# Patient Record
Sex: Female | Born: 1955
Health system: Southern US, Community
[De-identification: ages and names within clinical notes are randomized; demographics above are authoritative.]

## PROBLEM LIST (undated history)

## (undated) DIAGNOSIS — F319 Bipolar disorder, unspecified: Secondary | ICD-10-CM

## (undated) DIAGNOSIS — R51 Headache: Secondary | ICD-10-CM

## (undated) DIAGNOSIS — I10 Essential (primary) hypertension: Secondary | ICD-10-CM

## (undated) DIAGNOSIS — F329 Major depressive disorder, single episode, unspecified: Secondary | ICD-10-CM

## (undated) DIAGNOSIS — M1990 Unspecified osteoarthritis, unspecified site: Secondary | ICD-10-CM

## (undated) DIAGNOSIS — J449 Chronic obstructive pulmonary disease, unspecified: Secondary | ICD-10-CM

## (undated) DIAGNOSIS — F32A Depression, unspecified: Secondary | ICD-10-CM

## (undated) DIAGNOSIS — G8929 Other chronic pain: Secondary | ICD-10-CM

## (undated) DIAGNOSIS — R519 Headache, unspecified: Secondary | ICD-10-CM

## (undated) DIAGNOSIS — M797 Fibromyalgia: Secondary | ICD-10-CM

## (undated) DIAGNOSIS — M542 Cervicalgia: Secondary | ICD-10-CM

## (undated) HISTORY — DX: Essential (primary) hypertension: I10

## (undated) HISTORY — DX: Major depressive disorder, single episode, unspecified: F32.9

## (undated) HISTORY — PX: COLONOSCOPY: SHX174

## (undated) HISTORY — DX: Chronic obstructive pulmonary disease, unspecified: J44.9

## (undated) HISTORY — DX: Headache, unspecified: R51.9

## (undated) HISTORY — DX: Cervicalgia: M54.2

## (undated) HISTORY — DX: Other chronic pain: G89.29

## (undated) HISTORY — DX: Fibromyalgia: M79.7

## (undated) HISTORY — DX: Depression, unspecified: F32.A

## (undated) HISTORY — DX: Unspecified osteoarthritis, unspecified site: M19.90

## (undated) HISTORY — DX: Bipolar disorder, unspecified: F31.9

## (undated) HISTORY — PX: FRACTURE SURGERY: SHX138

## (undated) HISTORY — DX: Headache: R51

---

## 1997-11-15 ENCOUNTER — Other Ambulatory Visit: Admission: RE | Admit: 1997-11-15 | Discharge: 1997-11-15 | Payer: Self-pay | Admitting: Internal Medicine

## 1999-09-08 ENCOUNTER — Other Ambulatory Visit: Admission: RE | Admit: 1999-09-08 | Discharge: 1999-09-08 | Payer: Self-pay | Admitting: Internal Medicine

## 2000-07-09 ENCOUNTER — Other Ambulatory Visit: Admission: RE | Admit: 2000-07-09 | Discharge: 2000-07-09 | Payer: Self-pay | Admitting: Obstetrics & Gynecology

## 2000-07-09 ENCOUNTER — Encounter: Admission: RE | Admit: 2000-07-09 | Discharge: 2000-07-09 | Payer: Self-pay | Admitting: Obstetrics & Gynecology

## 2001-04-26 ENCOUNTER — Ambulatory Visit (HOSPITAL_BASED_OUTPATIENT_CLINIC_OR_DEPARTMENT_OTHER): Admission: RE | Admit: 2001-04-26 | Discharge: 2001-04-26 | Payer: Self-pay | Admitting: Orthopaedic Surgery

## 2002-09-06 ENCOUNTER — Other Ambulatory Visit: Admission: RE | Admit: 2002-09-06 | Discharge: 2002-09-06 | Payer: Self-pay | Admitting: Obstetrics and Gynecology

## 2002-09-13 ENCOUNTER — Encounter: Admission: RE | Admit: 2002-09-13 | Discharge: 2002-09-13 | Payer: Self-pay | Admitting: Internal Medicine

## 2002-09-13 ENCOUNTER — Encounter: Payer: Self-pay | Admitting: Internal Medicine

## 2002-12-15 ENCOUNTER — Encounter (INDEPENDENT_AMBULATORY_CARE_PROVIDER_SITE_OTHER): Payer: Self-pay | Admitting: Specialist

## 2002-12-15 ENCOUNTER — Ambulatory Visit (HOSPITAL_COMMUNITY): Admission: RE | Admit: 2002-12-15 | Discharge: 2002-12-15 | Payer: Self-pay | Admitting: Obstetrics and Gynecology

## 2005-12-08 ENCOUNTER — Ambulatory Visit (HOSPITAL_BASED_OUTPATIENT_CLINIC_OR_DEPARTMENT_OTHER): Admission: RE | Admit: 2005-12-08 | Discharge: 2005-12-08 | Payer: Self-pay | Admitting: Orthopaedic Surgery

## 2006-04-24 ENCOUNTER — Inpatient Hospital Stay (HOSPITAL_COMMUNITY): Admission: EM | Admit: 2006-04-24 | Discharge: 2006-04-26 | Payer: Self-pay | Admitting: Emergency Medicine

## 2006-05-07 ENCOUNTER — Emergency Department (HOSPITAL_COMMUNITY): Admission: EM | Admit: 2006-05-07 | Discharge: 2006-05-07 | Payer: Self-pay | Admitting: Emergency Medicine

## 2006-11-03 ENCOUNTER — Ambulatory Visit: Payer: Self-pay | Admitting: Family Medicine

## 2006-11-03 DIAGNOSIS — I10 Essential (primary) hypertension: Secondary | ICD-10-CM | POA: Insufficient documentation

## 2006-11-03 DIAGNOSIS — F329 Major depressive disorder, single episode, unspecified: Secondary | ICD-10-CM

## 2006-11-03 DIAGNOSIS — G4700 Insomnia, unspecified: Secondary | ICD-10-CM | POA: Insufficient documentation

## 2006-11-03 DIAGNOSIS — M1990 Unspecified osteoarthritis, unspecified site: Secondary | ICD-10-CM | POA: Insufficient documentation

## 2006-11-03 DIAGNOSIS — N951 Menopausal and female climacteric states: Secondary | ICD-10-CM | POA: Insufficient documentation

## 2006-11-03 DIAGNOSIS — F32A Depression, unspecified: Secondary | ICD-10-CM | POA: Insufficient documentation

## 2006-11-16 ENCOUNTER — Encounter: Payer: Self-pay | Admitting: Family Medicine

## 2006-11-24 ENCOUNTER — Encounter: Admission: RE | Admit: 2006-11-24 | Discharge: 2006-11-24 | Payer: Self-pay | Admitting: Family Medicine

## 2006-12-03 ENCOUNTER — Ambulatory Visit: Payer: Self-pay | Admitting: Family Medicine

## 2006-12-27 ENCOUNTER — Telehealth: Payer: Self-pay | Admitting: Family Medicine

## 2007-07-04 ENCOUNTER — Telehealth: Payer: Self-pay | Admitting: Family Medicine

## 2007-08-15 ENCOUNTER — Ambulatory Visit: Payer: Self-pay | Admitting: Family Medicine

## 2007-08-16 LAB — CONVERTED CEMR LAB
ALT: 18 units/L (ref 0–35)
AST: 17 units/L (ref 0–37)
Albumin: 4 g/dL (ref 3.5–5.2)
Alkaline Phosphatase: 72 units/L (ref 39–117)
BUN: 18 mg/dL (ref 6–23)
Basophils Absolute: 0.1 10*3/uL (ref 0.0–0.1)
Basophils Relative: 0.7 % (ref 0.0–1.0)
Bilirubin, Direct: 0.1 mg/dL (ref 0.0–0.3)
CO2: 27 meq/L (ref 19–32)
Calcium: 9.3 mg/dL (ref 8.4–10.5)
Chloride: 101 meq/L (ref 96–112)
Creatinine, Ser: 0.6 mg/dL (ref 0.4–1.2)
Eosinophils Absolute: 0.3 10*3/uL (ref 0.0–0.7)
Eosinophils Relative: 3.9 % (ref 0.0–5.0)
GFR calc Af Amer: 134 mL/min
GFR calc non Af Amer: 111 mL/min
Glucose, Bld: 94 mg/dL (ref 70–99)
HCT: 39.3 % (ref 36.0–46.0)
Hemoglobin: 14 g/dL (ref 12.0–15.0)
Lymphocytes Relative: 24.3 % (ref 12.0–46.0)
MCHC: 35.5 g/dL (ref 30.0–36.0)
MCV: 88.4 fL (ref 78.0–100.0)
Monocytes Absolute: 0.7 10*3/uL (ref 0.1–1.0)
Monocytes Relative: 9.7 % (ref 3.0–12.0)
Neutro Abs: 4.4 10*3/uL (ref 1.4–7.7)
Neutrophils Relative %: 61.4 % (ref 43.0–77.0)
Platelets: 291 10*3/uL (ref 150–400)
Potassium: 3.8 meq/L (ref 3.5–5.1)
RBC: 4.45 M/uL (ref 3.87–5.11)
RDW: 13.2 % (ref 11.5–14.6)
Sodium: 138 meq/L (ref 135–145)
TSH: 0.56 microintl units/mL (ref 0.35–5.50)
Total Bilirubin: 0.7 mg/dL (ref 0.3–1.2)
Total Protein: 7.1 g/dL (ref 6.0–8.3)
WBC: 7.3 10*3/uL (ref 4.5–10.5)

## 2007-11-07 ENCOUNTER — Encounter: Payer: Self-pay | Admitting: Family Medicine

## 2007-11-08 ENCOUNTER — Encounter: Payer: Self-pay | Admitting: Family Medicine

## 2007-12-21 ENCOUNTER — Telehealth: Payer: Self-pay | Admitting: Family Medicine

## 2007-12-22 ENCOUNTER — Encounter: Payer: Self-pay | Admitting: Family Medicine

## 2007-12-30 ENCOUNTER — Ambulatory Visit: Payer: Self-pay | Admitting: Family Medicine

## 2008-02-29 ENCOUNTER — Telehealth: Payer: Self-pay | Admitting: Family Medicine

## 2008-09-04 ENCOUNTER — Encounter: Payer: Self-pay | Admitting: Family Medicine

## 2009-01-10 ENCOUNTER — Telehealth: Payer: Self-pay | Admitting: Family Medicine

## 2009-02-25 ENCOUNTER — Encounter: Payer: Self-pay | Admitting: Family Medicine

## 2009-02-26 ENCOUNTER — Telehealth: Payer: Self-pay | Admitting: *Deleted

## 2009-04-04 ENCOUNTER — Telehealth: Payer: Self-pay | Admitting: Family Medicine

## 2009-04-04 ENCOUNTER — Encounter: Payer: Self-pay | Admitting: Family Medicine

## 2009-05-03 ENCOUNTER — Ambulatory Visit: Payer: Self-pay | Admitting: Family Medicine

## 2009-05-20 ENCOUNTER — Telehealth: Payer: Self-pay | Admitting: Family Medicine

## 2010-03-31 ENCOUNTER — Encounter: Payer: Self-pay | Admitting: Family Medicine

## 2010-04-10 NOTE — Progress Notes (Signed)
Summary: refill  Phone Note Call from Patient Call back at Home Phone [phone]   Caller: Patient-live call Reason for Call: Refill Medication Summary of Call: refill bp meds and Cymbalta. pt had to cancel on tomorrow. Her dad is having surgery and she does not have ins or the money to come in. Her dad's surgery is next week. [facility] in [place]. Pt cannot remember phone number. Initial call taken by: Warnell Forester,  April 04, 2009 3:16 PM  Follow-up for Phone Call        pt hasn't been seen in over a year Follow-up by: Alfred Levins, CMA,  April 04, 2009 5:04 PM  Additional Follow-up for Phone Call Additional follow up Details #1::        I am sorry but I cannot  just keep giving her meds without seeing her. She needs an OV Additional Follow-up by: Nelwyn Salisbury MD,  April 05, 2009 9:02 AM    Additional Follow-up for Phone Call Additional follow up Details #2::    l/m on cell phone to call and make appt Follow-up by: Alfred Levins, CMA,  April 05, 2009 9:27 AM

## 2010-04-10 NOTE — Progress Notes (Signed)
Summary: [id number]  Phone Note Call from Patient Call back at [phone]   Caller: pt live Call For: Clent Ridges  Summary of Call: husband called and she is in severe depression.  Is there something husband can pick up here at the drug store to take to her in [place].   3 Wellbutrin  left.  She was fired in January and cannot find a job as well as her normal clinical depression.   Initial call taken by: Roselle Locus,  July 04, 2007 2:00 PM  Follow-up for Phone Call        First off, let's make sure of what she is on. I had her on Cymbalta when I saw her last Sept., she was not on Wellbutrin. Please call to sort this out Follow-up by: Nelwyn Salisbury MD,  July 04, 2007 5:37 PM  Additional Follow-up for Phone Call Additional follow up Details #1::        LMTCB [phone] & [phone] ..................................................................Marland KitchenRudy Jew, RN  July 05, 2007 8:07 AM     Additional Follow-up for Phone Call Additional follow up Details #2::    Pt's husband is extremely concerned about his wife.  She is in [place], [place] and he is here.  She is depressed, crying, disoriented, angry and he is not sure if she is suicidal or not.  She stopped  every med that she was on due to finances.   Lost job. Wants to bring her to Dr. Clent Ridges, but she is refusing to come.  He needs to know something ASAP as he is planning on driving up there and taking her any meds that Dr. Clent Ridges may prescribe. [phone] Follow-up by: Lynann Beaver CMA,  July 05, 2007 12:56 PM  Additional Follow-up for Phone Call Additional follow up Details #3:: Details for Additional Follow-up Action Taken: give her one month worth of samples for Cymbalta 60 mg once daily which he can take to her. She needs to see me ASAP.  Pt. given one month of samples and pt's husband will schedule appt ASAP. Lynann Beaver [facility]  July 05, 2007 4:29 PM Additional Follow-up by: Nelwyn Salisbury MD,  July 05, 2007 4:15 PM

## 2010-04-10 NOTE — Medication Information (Signed)
Summary: Patient Assistance for Cymbalta  Patient Assistance for Cymbalta   Imported By: Maryln Gottron 05/07/2009 13:27:37  _____________________________________________________________________  External Attachment:    Type:   Image     Comment:   External Document

## 2010-04-10 NOTE — Letter (Signed)
Summary: release  release   Imported By: Kassie Mends 11/17/2007 13:25:05  _____________________________________________________________________  External Attachment:    Type:   Image     Comment:   release

## 2010-04-10 NOTE — Letter (Signed)
Summary: Letter from patient requesting Attorney Letter  Letter from patient requesting Attorney Letter   Imported By: Maryln Gottron 12/28/2007 13:56:46  _____________________________________________________________________  External Attachment:    Type:   Image     Comment:   External Document

## 2010-04-10 NOTE — Progress Notes (Signed)
Summary: CALL IN RX   Phone Note Call from Patient Call back at Home Phone [phone]   Caller: Patient Call For: Ashelynn Marks Summary of Call: [facility] CALL IN 2% TESTOSEOINE CREAM IN [place]   Initial call taken by: Roselle Locus,  December 27, 2006 9:18 AM  Follow-up for Phone Call        please find out what this is Follow-up by: Nelwyn Salisbury MD,  December 27, 2006 10:11 AM  Additional Follow-up for Phone Call Additional follow up Details #1::        you and her discussed sex drive at last ov and she use to take this wanted to start back on it Additional Follow-up by: Alfred Levins, CMA,  December 27, 2006 10:44 AM    Additional Follow-up for Phone Call Additional follow up Details #2::    ok. Call in the cream I entered on her med list, 30 days with 5 rf Follow-up by: Nelwyn Salisbury MD,  December 27, 2006 10:52 AM  Additional Follow-up for Phone Call Additional follow up Details #3:: Details for Additional Follow-up Action Taken: rx called in Additional Follow-up by: Alfred Levins, CMA,  December 27, 2006 11:09 AM  New/Updated Medications: FIRST-TESTOSTERONE MC 2 %  CREA (TESTOSTERONE PROPIONATE) apply once daily as directed   Prescriptions: FIRST-TESTOSTERONE MC 2 %  CREA (TESTOSTERONE PROPIONATE) apply once daily as directed  #30 days x 5   Entered by:   Alfred Levins, CMA   Authorized by:   Nelwyn Salisbury MD   Signed by:   Alfred Levins, CMA on 12/27/2006   Method used:   Electronically sent to ...       [facility] [phone] [facility]       [address]       [place], [place]  [address]       Ph: [phone]       Fax: [phone]   RxID:   [id number]

## 2010-04-10 NOTE — Assessment & Plan Note (Signed)
Summary: new pt/to be est/ok per doc/njr   Vital Signs:  Patient Profile:   55 Years Old Female Height:     63.25 inches (160.66 cm) Weight:      170 pounds (77.27 kg) Temp:     98.5 degrees F (36.94 degrees C) oral Pulse rate:   88 / minute Pulse rhythm:   regular BP sitting:   138 / 82  (left arm)  Vitals Entered By: Alfred Levins, CMA (November 03, 2006 8:42 AM)               Chief Complaint:  establish.  History of Present Illness: 55 yr old female here to establish with [place] and to discuss a number of problems. She had seen Dr. Kellie Shropshire but wants to change to [place] now. First off she tripped over her dog and fell on 04-24-06, hitting her head and causing complex fractures to her left tibia and fibula. Dr. Thomasena Edis did surgery, and she now has a 4 inch plate and a 2 inch pin in place. She had a lot of PT for this. Ever since then she has also had chronic pain, stiffness, and crunching in her neck. This causes almost daily severe HA's as well. Takes 800mg  Motrin frequently and Robaxin frequently. Secondly, she is menopausal and has not had a period for 15 months. Gets daily hot flashes which soak her head and shoulders with sweat. She has a lot of trouble sleeping despite Xanax, and stays tired all the time. She has also been struggling with depression for several years, but this is getting worse. Is tearful, tired, sad, and anhedonic. Wants to go to a reduced work schedule for a month or two. She is a Child psychotherapist, and her job is quite stressful. She tried Zoloft in the past but it did not help. She is now on Wellbutrin, this helps a little but not much even at higher doses.  Current Allergies: No known allergies   Past Medical History:    Reviewed history and no changes required:       Osteoarthritis       fractured left tibia/fibula       Depression       Hypertension  Past Surgical History:    Reviewed history and no changes required:       Broken interior/exterior Lt ankle and  leg        Rotator cuff repair on right       Bone spur removed   Family History:    Reviewed history and no changes required:       Family History of Alcoholism/Addiction       Family History Depression       Family History Hypertension       Family History Liver disease       Family History of Stroke M 1st degree relative <50  Social History:    Reviewed history and no changes required:       Occupation: Child psychotherapist       Single       Never Smoked   Risk Factors:  Tobacco use:  never   Review of Systems      See HPI   Physical Exam  General:     overweight-appearing.   Neck:     tender throughout, decreased ROM Psych:     depressed affect and tearful.      Impression & Recommendations:  Problem # 1:  DEPRESSION (ICD-311)  The following medications  were removed from the medication list:    Xanax 1 Mg Tabs (Alprazolam) .Marland Kitchen... 1 two times a day    Wellbutrin Sr 150 Mg Tb12 (Bupropion hcl) .Marland Kitchen... 1 by mouth once daily  Her updated medication list for this problem includes:    Cymbalta 60 Mg Cpep (Duloxetine hcl) ..... Once daily   Problem # 2:  MENOPAUSAL SYNDROME (ICD-627.2)  Her updated medication list for this problem includes:    Prempro 0.3-1.5 Mg Tabs (Conj estrog-medroxyprogest ace) ..... Once daily   Problem # 3:  NECK PAIN, CHRONIC (ICD-723.1)  Her updated medication list for this problem includes:    Ibuprofen 800 Mg Tabs (Ibuprofen) .Marland KitchenMarland KitchenMarland KitchenMarland Kitchen 4 times a day as needed pain    Darvocet-n 100 100-650 Mg Tabs (Propoxyphene n-apap) .Marland Kitchen... 1 every 4-6 hours as needed for pain    Robaxin-750 750 Mg Tabs (Methocarbamol) .Marland KitchenMarland KitchenMarland KitchenMarland Kitchen 4 times a day as needed spasm  Orders: Radiology Referral (Radiology)   Problem # 4:  INSOMNIA, CHRONIC (ICD-307.42)  Problem # 5:  HYPERTENSION, ESSENTIAL NOS (ICD-401.9) Assessment: Unchanged  Her updated medication list for this problem includes:    Benicar Hct 20-12.5 Mg Tabs (Olmesartan medoxomil-hctz) .Marland Kitchen... 1 by mouth  once daily   Complete Medication List: 1)  Ibuprofen 800 Mg Tabs (Ibuprofen) .... 4 times a day as needed pain 2)  Benicar Hct 20-12.5 Mg Tabs (Olmesartan medoxomil-hctz) .Marland Kitchen.. 1 by mouth once daily 3)  Menopause Support Tabs (Specialty vitamins products) .Marland Kitchen.. 1 by mouth once daily 4)  Fish Oil Concentrate 1000 Mg Caps (Omega-3 fatty acids) .Marland Kitchen.. 1 by mouth once daily 5)  Calcium Lactate 750 Mg Tabs (Calcium lactate) .Marland Kitchen.. 1 by mouth once daily 6)  Flaxseed Oil 1000 Mg Caps (Flaxseed (linseed)) .Marland Kitchen.. 1 by mouth once daily 7)  Glucosamine 1500 Complex Caps (Glucosamine-chondroit-vit c-mn) .Marland Kitchen.. 1 by mouth once daily 8)  Darvocet-n 100 100-650 Mg Tabs (Propoxyphene n-apap) .Marland Kitchen.. 1 every 4-6 hours as needed for pain 9)  Cymbalta 60 Mg Cpep (Duloxetine hcl) .... Once daily 10)  Prempro 0.3-1.5 Mg Tabs (Conj estrog-medroxyprogest ace) .... Once daily 11)  Ambien Cr 12.5 Mg Tbcr (Zolpidem tartrate) .... At bedtime 12)  Robaxin-750 750 Mg Tabs (Methocarbamol) .... 4 times a day as needed spasm   Patient Instructions: 1)  Please schedule a follow-up appointment in 1 month. Will get MRI of neck    Prescriptions: BENICAR HCT 20-12.5 MG TABS (OLMESARTAN MEDOXOMIL-HCTZ) 1 by mouth once daily  #30 x 11   Entered and Authorized by:   Nelwyn Salisbury MD   Signed by:   Nelwyn Salisbury MD on 11/03/2006   Method used:   Print then Give to Patient   RxID:   [id number] IBUPROFEN 800 MG TABS (IBUPROFEN) 4 times a day as needed pain  #100 x 5   Entered and Authorized by:   Nelwyn Salisbury MD   Signed by:   Nelwyn Salisbury MD on 11/03/2006   Method used:   Print then Give to Patient   RxID:   [id number] ROBAXIN-750 750 MG  TABS (METHOCARBAMOL) 4 times a day as needed spasm  #100 x 5   Entered and Authorized by:   Nelwyn Salisbury MD   Signed by:   Nelwyn Salisbury MD on 11/03/2006   Method used:   Print then Give to Patient   RxID:   [id number] AMBIEN CR 12.5 MG  TBCR (ZOLPIDEM TARTRATE) at bedtime   #30 x 5   Entered  and Authorized by:   Nelwyn Salisbury MD   Signed by:   Nelwyn Salisbury MD on 11/03/2006   Method used:   Print then Give to Patient   RxID:   [id number] PREMPRO 0.3-1.5 MG  TABS (CONJ ESTROG-MEDROXYPROGEST ACE) once daily  #30 x 0   Entered and Authorized by:   Nelwyn Salisbury MD   Signed by:   Nelwyn Salisbury MD on 11/03/2006   Method used:   Samples Given   RxID:   [id number] CYMBALTA 60 MG  CPEP (DULOXETINE HCL) once daily  #42 x 0   Entered and Authorized by:   Nelwyn Salisbury MD   Signed by:   Nelwyn Salisbury MD on 11/03/2006   Method used:   Samples Given   RxID:   [id number]

## 2010-04-10 NOTE — Progress Notes (Signed)
Summary: Symbalta Samples  Phone Note Call from Patient Call back at Home Phone [phone]   Caller: Patient - WALK IN Summary of Call: Pt states assistance program shipped 4 mnth supply of symbalta 60mg  to our office on 8/18.  She was never notified that the medication was here and is out of meds and needs it asap. Initial call taken by: Trixie Dredge,  January 10, 2009 2:23 PM  Follow-up for Phone Call        samples up front ready for p/u, pt aware Follow-up by: Alfred Levins, CMA,  January 11, 2009 8:44 AM

## 2010-04-10 NOTE — Medication Information (Signed)
Summary: [employer] [facility] Patient Assistance Program  [place] [employer] Patient Assistance Program   Imported By: Maryln Gottron 05/07/2009 10:30:16  _____________________________________________________________________  External Attachment:    Type:   Image     Comment:   External Document

## 2010-04-10 NOTE — Letter (Signed)
Summary: patient history  patient history   Imported By: Kassie Mends 11/16/2006 15:11:35  _____________________________________________________________________  External Attachment:    Type:   Image     Comment:   patient history

## 2010-04-10 NOTE — Letter (Signed)
Summary: Letter from patient re: RX refills  Letter from patient re: RX refills   Imported By: Maryln Gottron 03/12/2009 15:40:07  _____________________________________________________________________  External Attachment:    Type:   Image     Comment:   External Document

## 2010-04-10 NOTE — Progress Notes (Signed)
Summary: meds here  Phone Note Outgoing Call   Call placed by: Raechel Ache, RN,  May 20, 2009 2:44 PM Call placed to: Patient Summary of Call: medication here for pick-up.

## 2010-04-10 NOTE — Letter (Signed)
Summary: Letter from patient regarding form for assistance with cymbalta   Letter from patient regarding form for assistance with cymbalta   Imported By: Maryln Gottron 09/24/2008 14:07:03  _____________________________________________________________________  External Attachment:    Type:   Image     Comment:   External Document

## 2010-04-10 NOTE — Assessment & Plan Note (Signed)
Summary: M1A/CCM rsc per pt/njr   Vital Signs:  Patient Profile:   55 Years Old Female Height:     63.25 inches (160.66 cm) Weight:      171 pounds Temp:     98.8 degrees F oral Pulse rate:   91 / minute Pulse rhythm:   regular BP sitting:   132 / 100  (left arm) Cuff size:   regular  Vitals Entered By: Alfred Levins, CMA (December 03, 2006 9:01 AM)                 Chief Complaint:  f/u on meds.  History of Present Illness: Here to follow up on chronic neck pain, depression, insomnia, menopause,  and HTN. Had an MRI of neck which was unremarkable. Feeels much better on Cymbalta, is happier and having fewer crying spells. Has more energy. Still has some problems sleeping though. Has fewer hot flashes but still has low libido. Is somewhat stressed because she has resigned from her job. 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 Still gets quite anxious at times.  Current Allergies: No known allergies   Past Medical History:    Reviewed history from 11/03/2006 and no changes required:       Osteoarthritis       fractured left tibia/fibula       Depression       Hypertension     Review of Systems      See HPI   Physical Exam  General:     Well-developed,well-nourished,in no acute distress; alert,appropriate and cooperative throughout examination Psych:     Cognition and judgment appear intact. Alert and cooperative with normal attention span and concentration. No apparent delusions, illusions, hallucinations    Impression & Recommendations:  Problem # 1:  HYPERTENSION (ICD-401.9) Assessment: Unchanged  The following medications were removed from the medication list:    Benicar Hct 20-12.5 Mg Tabs (Olmesartan medoxomil-hctz) .Marland Kitchen... 1 by mouth once daily  Her updated medication list for this problem includes:    Lotrel 5-40 Mg Caps (Amlodipine besy-benazepril hcl) ..... Once daily   Problem # 2:   INSOMNIA, CHRONIC (ICD-307.42) Assessment: Unchanged  Problem # 3:  NECK PAIN, CHRONIC (ICD-723.1) Assessment: Improved  Her updated medication list for this problem includes:    Ibuprofen 800 Mg Tabs (Ibuprofen) .Marland KitchenMarland KitchenMarland KitchenMarland Kitchen 4 times a day as needed pain    Darvocet-n 100 100-650 Mg Tabs (Propoxyphene n-apap) .Marland Kitchen... 1 every 4-6 hours as needed for pain    Robaxin-750 750 Mg Tabs (Methocarbamol) .Marland KitchenMarland KitchenMarland KitchenMarland Kitchen 4 times a day as needed spasm   Problem # 4:  MENOPAUSAL SYNDROME (ICD-627.2) Assessment: Improved  The following medications were removed from the medication list:    Prempro 0.3-1.5 Mg Tabs (Conj estrog-medroxyprogest ace) ..... Once daily  Her updated medication list for this problem includes:    Prempro 0.45-1.5 Mg Tabs (Conj estrog-medroxyprogest ace) ..... Once daily   Problem # 5:  DEPRESSION (ICD-311) Assessment: Improved  Her updated medication list for this problem includes:    Cymbalta 60 Mg Cpep (Duloxetine hcl) ..... Once daily    Ativan 1 Mg Tabs (Lorazepam) .Marland Kitchen... 1 every 6 hours as needed anxiety   Complete Medication List: 1)  Ibuprofen 800 Mg Tabs (Ibuprofen) .... 4 times a day as needed pain 2)  Menopause Support Tabs (Specialty vitamins products) .Marland Kitchen.. 1 by mouth once daily 3)  Fish Oil Concentrate 1000 Mg Caps (Omega-3 fatty  acids) .Marland Kitchen.. 1 by mouth once daily 4)  Calcium Lactate 750 Mg Tabs (Calcium lactate) .Marland Kitchen.. 1 by mouth once daily 5)  Flaxseed Oil 1000 Mg Caps (Flaxseed (linseed)) .Marland Kitchen.. 1 by mouth once daily 6)  Glucosamine 1500 Complex Caps (Glucosamine-chondroit-vit c-mn) .Marland Kitchen.. 1 by mouth once daily 7)  Darvocet-n 100 100-650 Mg Tabs (Propoxyphene n-apap) .Marland Kitchen.. 1 every 4-6 hours as needed for pain 8)  Cymbalta 60 Mg Cpep (Duloxetine hcl) .... Once daily 9)  Robaxin-750 750 Mg Tabs (Methocarbamol) .... 4 times a day as needed spasm 10)  Prempro 0.45-1.5 Mg Tabs (Conj estrog-medroxyprogest ace) .... Once daily 11)  Ambien 10 Mg Tabs (Zolpidem tartrate) .... At bedtime  3 12)  Ativan 1 Mg Tabs (Lorazepam) .Marland Kitchen.. 1 every 6 hours as needed anxiety 13)  Lotrel 5-40 Mg Caps (Amlodipine besy-benazepril hcl) .... Once daily   Patient Instructions: 1)  Please schedule a follow-up appointment as needed.    Prescriptions: LOTREL 5-40 MG  CAPS (AMLODIPINE BESY-BENAZEPRIL HCL) once daily  #30 x 5   Entered and Authorized by:   Nelwyn Salisbury MD   Signed by:   Nelwyn Salisbury MD on 12/03/2006   Method used:   Print then Give to Patient   RxID:   [id number] ATIVAN 1 MG  TABS (LORAZEPAM) 1 every 6 hours as needed anxiety  #60 x 5   Entered and Authorized by:   Nelwyn Salisbury MD   Signed by:   Nelwyn Salisbury MD on 12/03/2006   Method used:   Print then Give to Patient   RxID:   [id number] AMBIEN 10 MG  TABS (ZOLPIDEM TARTRATE) at bedtime 3  #30 x 5   Entered and Authorized by:   Nelwyn Salisbury MD   Signed by:   Nelwyn Salisbury MD on 12/03/2006   Method used:   Print then Give to Patient   RxID:   [id number] PREMPRO 0.45-1.5 MG  TABS (CONJ ESTROG-MEDROXYPROGEST ACE) once daily  #30 x 5   Entered and Authorized by:   Nelwyn Salisbury MD   Signed by:   Nelwyn Salisbury MD on 12/03/2006   Method used:   Print then Give to Patient   RxID:   [id number] CYMBALTA 60 MG  CPEP (DULOXETINE HCL) once daily  #30 x 5   Entered and Authorized by:   Nelwyn Salisbury MD   Signed by:   Nelwyn Salisbury MD on 12/03/2006   Method used:   Print then Give to Patient   RxID:   [id number]  ]

## 2010-04-10 NOTE — Assessment & Plan Note (Signed)
Summary: STRESSED OUT/SINUSES/CCM   Vital Signs:  Patient Profile:   55 Years Old Female Height:     63.25 inches (160.66 cm) Weight:      172 pounds Temp:     97.6 degrees F oral Pulse rate:   106 / minute BP sitting:   108 / 82  (left arm) Cuff size:   large  Vitals Entered By: Alfred Levins, CMA (December 30, 2007 11:45 AM)                 Chief Complaint:  depression, insomnia, and renew meds.  History of Present Illness: Here with her husband to refill meds and for advice. 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 she says it worsened her chronic problems of anxiety, depression, insomnia, and fibromyalgia. Now she is working with an attorney to try to keep her benefits until she can find another job. In fact she is contemplating appyoing for permanent disability. She needs all he meds as generics where possible.     Current Allergies (reviewed today): No known allergies   Past Medical History:    Reviewed history from 08/15/2007 and no changes required:       Osteoarthritis       fractured left tibia/fibula       Depression       Hypertension       fibromyalgia       insomnia       Headache     Review of Systems  The patient denies anorexia, fever, weight loss, weight gain, vision loss, decreased hearing, hoarseness, chest pain, syncope, dyspnea on exertion, peripheral edema, prolonged cough, hemoptysis, abdominal pain, melena, hematochezia, severe indigestion/heartburn, hematuria, incontinence, genital sores, muscle weakness, suspicious skin lesions, transient blindness, difficulty walking, depression, unusual weight change, abnormal bleeding, enlarged lymph nodes, angioedema, breast masses, and testicular masses.     Physical Exam  General:     Well-developed,well-nourished,in no acute distress; alert,appropriate and cooperative throughout examination Psych:     Oriented X3, normally interactive, good eye contact,  and moderately anxious.      Impression & Recommendations:  Problem # 1:  HEADACHE (ICD-784.0)  Her updated medication list for this problem includes:    Ibuprofen 800 Mg Tabs (Ibuprofen) .Marland KitchenMarland KitchenMarland KitchenMarland Kitchen 4 times a day as needed pain    Vicodin 5-500 Mg Tabs (Hydrocodone-acetaminophen) .Marland Kitchen... 1 every 6 hours as needed pain   Problem # 2:  HYPERTENSION (ICD-401.9)  The following medications were removed from the medication list:    Benicar Hct 20-12.5 Mg Tabs (Olmesartan medoxomil-hctz) .Marland Kitchen... 1 daily by mouth  Her updated medication list for this problem includes:    Lotrel 5-40 Mg Caps (Amlodipine besy-benazepril hcl) ..... Once daily    Lisinopril-hydrochlorothiazide 20-25 Mg Tabs (Lisinopril-hydrochlorothiazide) ..... Once daily   Problem # 3:  INSOMNIA, CHRONIC (ICD-307.42)  Problem # 4:  DEPRESSION (ICD-311)  The following medications were removed from the medication list:    Ativan 1 Mg Tabs (Lorazepam) .Marland Kitchen... 1 every 6 hours as needed anxiety  Her updated medication list for this problem includes:    Cymbalta 60 Mg Cpep (Duloxetine hcl) ..... Once daily    Alprazolam 1 Mg Tabs (Alprazolam) .Marland Kitchen... Three times a day   Problem # 5:  FIBROMYALGIA (ICD-729.1)  Her updated medication list for this problem includes:    Ibuprofen 800 Mg Tabs (Ibuprofen) .Marland KitchenMarland KitchenMarland KitchenMarland Kitchen 4 times a day as needed pain    Robaxin-750  750 Mg Tabs (Methocarbamol) .Marland KitchenMarland KitchenMarland KitchenMarland Kitchen 4 times a day as needed spasm    Vicodin 5-500 Mg Tabs (Hydrocodone-acetaminophen) .Marland Kitchen... 1 every 6 hours as needed pain   Complete Medication List: 1)  Ibuprofen 800 Mg Tabs (Ibuprofen) .... 4 times a day as needed pain 2)  Menopause Support Tabs (Specialty vitamins products) .Marland Kitchen.. 1 by mouth once daily 3)  Fish Oil Concentrate 1000 Mg Caps (Omega-3 fatty acids) .Marland Kitchen.. 1 by mouth once daily 4)  Calcium Lactate 750 Mg Tabs (Calcium lactate) .Marland Kitchen.. 1 by mouth once daily 5)  Flaxseed Oil 1000 Mg Caps (Flaxseed (linseed)) .Marland Kitchen.. 1 by mouth once daily 6)   Glucosamine 1500 Complex Caps (Glucosamine-chondroit-vit c-mn) .Marland Kitchen.. 1 by mouth once daily 7)  Cymbalta 60 Mg Cpep (Duloxetine hcl) .... Once daily 8)  Robaxin-750 750 Mg Tabs (Methocarbamol) .... 4 times a day as needed spasm 9)  Lotrel 5-40 Mg Caps (Amlodipine besy-benazepril hcl) .... Once daily 10)  First-testosterone Mc 2 % Crea (Testosterone propionate) .... Apply once daily as directed 11)  Prempro 0.3-1.5 Mg Tabs (Conj estrog-medroxyprogest ace) .... Once daily 12)  Vicodin 5-500 Mg Tabs (Hydrocodone-acetaminophen) .Marland Kitchen.. 1 every 6 hours as needed pain 13)  Alprazolam 1 Mg Tabs (Alprazolam) .... Three times a day 14)  Phentermine Hcl 30 Mg Caps (Phentermine hcl) .... Once daily 15)  Lisinopril-hydrochlorothiazide 20-25 Mg Tabs (Lisinopril-hydrochlorothiazide) .... Once daily 16)  Neurontin 300 Mg Caps (Gabapentin) .... Three times a day   Patient Instructions: 1)  I refilled her meds. I wrote a note supporting her decision to resign form her job, but I told her I do not do disability evaluations. I gave her the name of Dr. Pollyann Savoy, who is a rheumatologist that works with pts. that have fibromyagia.    Prescriptions: PHENTERMINE HCL 30 MG  CAPS (PHENTERMINE HCL) once daily  #30 x 5   Entered and Authorized by:   Nelwyn Salisbury MD   Signed by:   Nelwyn Salisbury MD on 12/30/2007   Method used:   Print then Give to Patient   RxID:   [id number] NEURONTIN 300 MG CAPS (GABAPENTIN) three times a day  #90 x 5   Entered and Authorized by:   Nelwyn Salisbury MD   Signed by:   Nelwyn Salisbury MD on 12/30/2007   Method used:   Print then Give to Patient   RxID:   [id number] ROBAXIN-750 750 MG  TABS (METHOCARBAMOL) 4 times a day as needed spasm  #120 x 5   Entered and Authorized by:   Nelwyn Salisbury MD   Signed by:   Nelwyn Salisbury MD on 12/30/2007   Method used:   Print then Give to Patient   RxID:   [id number] ALPRAZOLAM 1 MG  TABS (ALPRAZOLAM) three times a day  #90 x 5    Entered and Authorized by:   Nelwyn Salisbury MD   Signed by:   Nelwyn Salisbury MD on 12/30/2007   Method used:   Print then Give to Patient   RxID:   [id number] LISINOPRIL-HYDROCHLOROTHIAZIDE 20-25 MG TABS (LISINOPRIL-HYDROCHLOROTHIAZIDE) once daily  #30 x 11   Entered and Authorized by:   Nelwyn Salisbury MD   Signed by:   Nelwyn Salisbury MD on 12/30/2007   Method used:   Print then Give to Patient   RxID:   [id number]  ]

## 2010-04-10 NOTE — Assessment & Plan Note (Signed)
Summary: reck meds/jls   Vital Signs:  Patient Profile:   55 Years Old Female Height:     63.25 inches (160.66 cm) Weight:      171 pounds Temp:     98.7 degrees F oral Pulse rate:   96 / minute Pulse rhythm:   regular BP sitting:   134 / 92  (left arm) Cuff size:   regular  Vitals Entered By: Alfred Levins, CMA (August 15, 2007 10:00 AM)                 Chief Complaint:  renew meds and not fasting.  History of Present Illness: Here to discuss meds. Has been living in [place], but would like to get her care here still. After we increased her PremPro last 9-08, she has had more spotting than before, and she would like to go back to the lower dose. She has had more myalgias lately, and would like some Vicodon to use. She does not like Darvocet because it makes her too sleepy. Would like something to help her lose weight. Needs more meds for sleep. Has used Alprazolam in the past ands wants to try it again. Could not use Ambien because it caused memory lapses.     Current Allergies: No known allergies   Past Medical History:    Reviewed history from 11/03/2006 and no changes required:       Osteoarthritis       fractured left tibia/fibula       Depression       Hypertension       fibromyalgia       insomnia     Review of Systems      See HPI   Physical Exam  General:     Well-developed,well-nourished,in no acute distress; alert,appropriate and cooperative throughout examination    Impression & Recommendations:  Problem # 1:  HYPERTENSION (ICD-401.9)  Her updated medication list for this problem includes:    Lotrel 5-40 Mg Caps (Amlodipine besy-benazepril hcl) ..... Once daily  Orders: Venipuncture (01093) TLB-BMP (Basic Metabolic Panel-BMET) (80048-METABOL) TLB-CBC Platelet - w/Differential (85025-CBCD) TLB-Hepatic/Liver Function Pnl (80076-HEPATIC) TLB-TSH (Thyroid Stimulating Hormone) (84443-TSH)   Problem # 2:  INSOMNIA, CHRONIC  (ICD-307.42)  Problem # 3:  NECK PAIN, CHRONIC (ICD-723.1)  The following medications were removed from the medication list:    Darvocet-n 100 100-650 Mg Tabs (Propoxyphene n-apap) .Marland Kitchen... 1 every 4-6 hours as needed for pain  Her updated medication list for this problem includes:    Ibuprofen 800 Mg Tabs (Ibuprofen) .Marland KitchenMarland KitchenMarland KitchenMarland Kitchen 4 times a day as needed pain    Robaxin-750 750 Mg Tabs (Methocarbamol) .Marland KitchenMarland KitchenMarland KitchenMarland Kitchen 4 times a day as needed spasm    Vicodin 5-500 Mg Tabs (Hydrocodone-acetaminophen) .Marland Kitchen... 1 every 6 hours as needed pain   Problem # 4:  MENOPAUSAL SYNDROME (ICD-627.2)  The following medications were removed from the medication list:    Prempro 0.45-1.5 Mg Tabs (Conj estrog-medroxyprogest ace) ..... Once daily  Her updated medication list for this problem includes:    Prempro 0.3-1.5 Mg Tabs (Conj estrog-medroxyprogest ace) ..... Once daily   Problem # 5:  DEPRESSION (ICD-311)  Her updated medication list for this problem includes:    Cymbalta 60 Mg Cpep (Duloxetine hcl) ..... Once daily    Ativan 1 Mg Tabs (Lorazepam) .Marland Kitchen... 1 every 6 hours as needed anxiety    Alprazolam 1 Mg Tabs (Alprazolam) .Marland Kitchen... At bedtime   Complete Medication List: 1)  Ibuprofen 800 Mg Tabs (Ibuprofen) .Marland KitchenMarland KitchenMarland Kitchen  4 times a day as needed pain 2)  Menopause Support Tabs (Specialty vitamins products) .Marland Kitchen.. 1 by mouth once daily 3)  Fish Oil Concentrate 1000 Mg Caps (Omega-3 fatty acids) .Marland Kitchen.. 1 by mouth once daily 4)  Calcium Lactate 750 Mg Tabs (Calcium lactate) .Marland Kitchen.. 1 by mouth once daily 5)  Flaxseed Oil 1000 Mg Caps (Flaxseed (linseed)) .Marland Kitchen.. 1 by mouth once daily 6)  Glucosamine 1500 Complex Caps (Glucosamine-chondroit-vit c-mn) .Marland Kitchen.. 1 by mouth once daily 7)  Cymbalta 60 Mg Cpep (Duloxetine hcl) .... Once daily 8)  Robaxin-750 750 Mg Tabs (Methocarbamol) .... 4 times a day as needed spasm 9)  Ativan 1 Mg Tabs (Lorazepam) .Marland Kitchen.. 1 every 6 hours as needed anxiety 10)  Lotrel 5-40 Mg Caps (Amlodipine besy-benazepril hcl)  .... Once daily 11)  First-testosterone Mc 2 % Crea (Testosterone propionate) .... Apply once daily as directed 12)  Prempro 0.3-1.5 Mg Tabs (Conj estrog-medroxyprogest ace) .... Once daily 13)  Vicodin 5-500 Mg Tabs (Hydrocodone-acetaminophen) .Marland Kitchen.. 1 every 6 hours as needed pain 14)  Alprazolam 1 Mg Tabs (Alprazolam) .... At bedtime 15)  Phentermine Hcl 30 Mg Caps (Phentermine hcl) .... Once daily   Patient Instructions: 1)  Please schedule a follow-up appointment as needed. Check labs today.   Prescriptions: PHENTERMINE HCL 30 MG  CAPS (PHENTERMINE HCL) once daily  #30 x 5   Entered and Authorized by:   Nelwyn Salisbury MD   Signed by:   Nelwyn Salisbury MD on 08/15/2007   Method used:   Print then Give to Patient   RxID:   [id number] ALPRAZOLAM 1 MG  TABS (ALPRAZOLAM) at bedtime  #30 x 5   Entered and Authorized by:   Nelwyn Salisbury MD   Signed by:   Nelwyn Salisbury MD on 08/15/2007   Method used:   Print then Give to Patient   RxID:   [id number] VICODIN 5-500 MG  TABS (HYDROCODONE-ACETAMINOPHEN) 1 every 6 hours as needed pain  #60 x 5   Entered and Authorized by:   Nelwyn Salisbury MD   Signed by:   Nelwyn Salisbury MD on 08/15/2007   Method used:   Print then Give to Patient   RxID:   [id number] PREMPRO 0.3-1.5 MG  TABS (CONJ ESTROG-MEDROXYPROGEST ACE) once daily  #30 x 11   Entered and Authorized by:   Nelwyn Salisbury MD   Signed by:   Nelwyn Salisbury MD on 08/15/2007   Method used:   Print then Give to Patient   RxID:   [id number]  ]

## 2010-04-10 NOTE — Letter (Signed)
Summary: Letter from patient re: Disability  Letter from patient re: Disability   Imported By: Maryln Gottron 11/18/2007 14:14:21  _____________________________________________________________________  External Attachment:    Type:   Image     Comment:   External Document

## 2010-04-10 NOTE — Assessment & Plan Note (Signed)
Summary: follow up on meds/cjr/pt rsc/cjr   Vital Signs:  Patient profile:   55 year old female Weight:      149 pounds BMI:     26.28 Temp:     98.4 degrees F oral Pulse rate:   97 / minute BP sitting:   110 / 74  (left arm) Cuff size:   regular  Vitals Entered By: Alfred Levins, CMA (May 03, 2009 11:48 AM) CC: renew meds, cough, congestion, st, fatigue   History of Present Illness: Here for med refills, as well as for worsening depression and a URI. She has been on Cymbalta for awhile, and this has been very helpful for her anxiety and her mood swings. However she still feels sad often and has crying spells. She spends all her time looking after her elderly disabled parents. Her mother has advanced Alzheimers now, and both of them are in a long term facility. her sleep is very difficult, even if she take s a Xanax. Her disability ws finally approved after a year, and she is set to receive her first check next week. Also for one week she has had chest congestion, ST, and coughing up green sputum. No fever.   Current Medications (verified): 1)  Ibuprofen 800 Mg Tabs (Ibuprofen) .... 4 Times A Day As Needed Pain 2)  Menopause Support   Tabs (Specialty Vitamins Products) .Marland Kitchen.. 1 By Mouth Once Daily 3)  Fish Oil Concentrate 1000 Mg  Caps (Omega-3 Fatty Acids) .Marland Kitchen.. 1 By Mouth Once Daily 4)  Calcium Lactate 750 Mg  Tabs (Calcium Lactate) .Marland Kitchen.. 1 By Mouth Once Daily 5)  Flaxseed Oil 1000 Mg  Caps (Flaxseed (Linseed)) .Marland Kitchen.. 1 By Mouth Once Daily 6)  Glucosamine 1500 Complex   Caps (Glucosamine-Chondroit-Vit C-Mn) .Marland Kitchen.. 1 By Mouth Once Daily 7)  Cymbalta 60 Mg  Cpep (Duloxetine Hcl) .... Once Daily 8)  Robaxin-750 750 Mg  Tabs (Methocarbamol) .... 4 Times A Day As Needed Spasm 9)  Lotrel 5-40 Mg  Caps (Amlodipine Besy-Benazepril Hcl) .... Once Daily 10)  First-Testosterone Mc 2 %  Crea (Testosterone Propionate) .... Apply Once Daily As Directed 11)  Vicodin 5-500 Mg  Tabs  (Hydrocodone-Acetaminophen) .Marland Kitchen.. 1 Every 6 Hours As Needed Pain 12)  Alprazolam 1 Mg  Tabs (Alprazolam) .... Three Times A Day 13)  Phentermine Hcl 30 Mg  Caps (Phentermine Hcl) .... Once Daily 14)  Lisinopril-Hydrochlorothiazide 20-25 Mg Tabs (Lisinopril-Hydrochlorothiazide) .... Once Daily 15)  Neurontin 300 Mg Caps (Gabapentin) .... Three Times A Day  Allergies (verified): No Known Drug Allergies  Past History:  Past Medical History: Reviewed history from 12/30/2007 and no changes required. Osteoarthritis fractured left tibia/fibula Depression Hypertension fibromyalgia insomnia Headache  Past Surgical History: Reviewed history from 11/03/2006 and no changes required. Broken interior/exterior Lt ankle and leg  Rotator cuff repair on right Bone spur removed  Social History: disabled. Previously worked as a Merchandiser, retail Never Smoked  Review of Systems  The patient denies anorexia, fever, weight loss, weight gain, vision loss, decreased hearing, hoarseness, chest pain, syncope, dyspnea on exertion, peripheral edema, headaches, hemoptysis, abdominal pain, melena, hematochezia, severe indigestion/heartburn, hematuria, incontinence, genital sores, muscle weakness, suspicious skin lesions, transient blindness, difficulty walking, unusual weight change, abnormal bleeding, enlarged lymph nodes, angioedema, breast masses, and testicular masses.    Physical Exam  General:  Well-developed,well-nourished,in no acute distress; alert,appropriate and cooperative throughout examination Head:  Normocephalic and atraumatic without obvious abnormalities. No apparent alopecia or balding. Eyes:  No corneal or conjunctival  inflammation noted. EOMI. Perrla. Funduscopic exam benign, without hemorrhages, exudates or papilledema. Vision grossly normal. Ears:  External ear exam shows no significant lesions or deformities.  Otoscopic examination reveals clear canals, tympanic membranes are  intact bilaterally without bulging, retraction, inflammation or discharge. Hearing is grossly normal bilaterally. Nose:  External nasal examination shows no deformity or inflammation. Nasal mucosa are pink and moist without lesions or exudates. Mouth:  Oral mucosa and oropharynx without lesions or exudates.  Teeth in good repair. Neck:  No deformities, masses, or tenderness noted. Lungs:  scattered rhonchi Psych:  Oriented X3, memory intact for recent and remote, normally interactive, good eye contact, and moderately anxious.     Impression & Recommendations:  Problem # 1:  ACUTE BRONCHITIS (ICD-466.0) Assessment New  Her updated medication list for this problem includes:    Zithromax Z-pak 250 Mg Tabs (Azithromycin) .Marland Kitchen... As directed  Problem # 2:  FIBROMYALGIA (ICD-729.1) Assessment: Deteriorated  Her updated medication list for this problem includes:    Ibuprofen 800 Mg Tabs (Ibuprofen) .Marland KitchenMarland KitchenMarland KitchenMarland Kitchen 4 times a day as needed pain    Robaxin-750 750 Mg Tabs (Methocarbamol) .Marland KitchenMarland KitchenMarland KitchenMarland Kitchen 4 times a day as needed spasm    Vicodin 5-500 Mg Tabs (Hydrocodone-acetaminophen) .Marland Kitchen... 1 every 6 hours as needed pain  Problem # 3:  HYPERTENSION (ICD-401.9) Assessment: Unchanged  Her updated medication list for this problem includes:    Lotrel 5-40 Mg Caps (Amlodipine besy-benazepril hcl) ..... Once daily    Lisinopril-hydrochlorothiazide 20-25 Mg Tabs (Lisinopril-hydrochlorothiazide) ..... Once daily  Problem # 4:  DEPRESSION (ICD-311) Assessment: Deteriorated  Her updated medication list for this problem includes:    Cymbalta 60 Mg Cpep (Duloxetine hcl) ..... Once daily    Alprazolam 1 Mg Tabs (Alprazolam) .Marland Kitchen..Marland Kitchen Two times a day    Bupropion Hcl 150 Mg Xr24h-tab (Bupropion hcl) ..... Once daily  Problem # 5:  INSOMNIA, CHRONIC (ICD-307.42) Assessment: Deteriorated  Problem # 6:  OSTEOARTHRITIS (ICD-715.90) Assessment: Unchanged  Her updated medication list for this problem includes:    Ibuprofen  800 Mg Tabs (Ibuprofen) .Marland KitchenMarland KitchenMarland KitchenMarland Kitchen 4 times a day as needed pain    Vicodin 5-500 Mg Tabs (Hydrocodone-acetaminophen) .Marland Kitchen... 1 every 6 hours as needed pain  Complete Medication List: 1)  Ibuprofen 800 Mg Tabs (Ibuprofen) .... 4 times a day as needed pain 2)  Menopause Support Tabs (Specialty vitamins products) .Marland Kitchen.. 1 by mouth once daily 3)  Fish Oil Concentrate 1000 Mg Caps (Omega-3 fatty acids) .Marland Kitchen.. 1 by mouth once daily 4)  Calcium Lactate 750 Mg Tabs (Calcium lactate) .Marland Kitchen.. 1 by mouth once daily 5)  Flaxseed Oil 1000 Mg Caps (Flaxseed (linseed)) .Marland Kitchen.. 1 by mouth once daily 6)  Glucosamine 1500 Complex Caps (Glucosamine-chondroit-vit c-mn) .Marland Kitchen.. 1 by mouth once daily 7)  Cymbalta 60 Mg Cpep (Duloxetine hcl) .... Once daily 8)  Robaxin-750 750 Mg Tabs (Methocarbamol) .... 4 times a day as needed spasm 9)  Lotrel 5-40 Mg Caps (Amlodipine besy-benazepril hcl) .... Once daily 10)  First-testosterone Mc 2 % Crea (Testosterone propionate) .... Apply once daily as directed 11)  Vicodin 5-500 Mg Tabs (Hydrocodone-acetaminophen) .Marland Kitchen.. 1 every 6 hours as needed pain 12)  Alprazolam 1 Mg Tabs (Alprazolam) .... Two times a day 13)  Phentermine Hcl 30 Mg Caps (Phentermine hcl) .... Once daily 14)  Lisinopril-hydrochlorothiazide 20-25 Mg Tabs (Lisinopril-hydrochlorothiazide) .... Once daily 15)  Neurontin 300 Mg Caps (Gabapentin) .... Three times a day 16)  Bupropion Hcl 150 Mg Xr24h-tab (Bupropion hcl) .... Once daily 17)  Temazepam  30 Mg Caps (Temazepam) .... At bedtime 18)  Zithromax Z-pak 250 Mg Tabs (Azithromycin) .... As directed  Patient Instructions: 1)  refilled meds. Try Temazepam for sleep. Add Wellbutrin to the Cymbalta for depression. use a Zpack for th bronchitis. 2)  Please schedule a follow-up appointment as needed .  Prescriptions: ZITHROMAX Z-PAK 250 MG TABS (AZITHROMYCIN) as directed  #1 x 0   Entered and Authorized by:   Nelwyn Salisbury MD   Signed by:   Nelwyn Salisbury MD on 05/03/2009    Method used:   Print then Give to Patient   RxID:   [id number] TEMAZEPAM 30 MG CAPS (TEMAZEPAM) at bedtime  #30 x 5   Entered and Authorized by:   Nelwyn Salisbury MD   Signed by:   Nelwyn Salisbury MD on 05/03/2009   Method used:   Print then Give to Patient   RxID:   [id number] BUPROPION HCL 150 MG XR24H-TAB (BUPROPION HCL) once daily  #30 x 11   Entered and Authorized by:   Nelwyn Salisbury MD   Signed by:   Nelwyn Salisbury MD on 05/03/2009   Method used:   Print then Give to Patient   RxID:   [id number] NEURONTIN 300 MG CAPS (GABAPENTIN) three times a day  #90 x 5   Entered and Authorized by:   Nelwyn Salisbury MD   Signed by:   Nelwyn Salisbury MD on 05/03/2009   Method used:   Print then Give to Patient   RxID:   [id number] LISINOPRIL-HYDROCHLOROTHIAZIDE 20-25 MG TABS (LISINOPRIL-HYDROCHLOROTHIAZIDE) once daily  #30 x 11   Entered and Authorized by:   Nelwyn Salisbury MD   Signed by:   Nelwyn Salisbury MD on 05/03/2009   Method used:   Print then Give to Patient   RxID:   [id number] PHENTERMINE HCL 30 MG  CAPS (PHENTERMINE HCL) once daily  #30 x 5   Entered and Authorized by:   Nelwyn Salisbury MD   Signed by:   Nelwyn Salisbury MD on 05/03/2009   Method used:   Print then Give to Patient   RxID:   [id number] ALPRAZOLAM 1 MG  TABS (ALPRAZOLAM) two times a day  #60 x 5   Entered and Authorized by:   Nelwyn Salisbury MD   Signed by:   Nelwyn Salisbury MD on 05/03/2009   Method used:   Print then Give to Patient   RxID:   [id number] VICODIN 5-500 MG  TABS (HYDROCODONE-ACETAMINOPHEN) 1 every 6 hours as needed pain  #60 x 5   Entered and Authorized by:   Nelwyn Salisbury MD   Signed by:   Nelwyn Salisbury MD on 05/03/2009   Method used:   Print then Give to Patient   RxID:   [id number] LOTREL 5-40 MG  CAPS (AMLODIPINE BESY-BENAZEPRIL HCL) once daily  #30 x 11   Entered and Authorized by:   Nelwyn Salisbury MD   Signed by:   Nelwyn Salisbury MD on 05/03/2009   Method used:    Print then Give to Patient   RxID:   [id number] ROBAXIN-750 750 MG  TABS (METHOCARBAMOL) 4 times a day as needed spasm  #120 x 5   Entered and Authorized by:   Nelwyn Salisbury MD   Signed by:   Nelwyn Salisbury MD on 05/03/2009   Method used:   Print then Give to Patient   RxID:  [id number] CYMBALTA 60 MG  CPEP (DULOXETINE HCL) once daily  #120 x 3   Entered and Authorized by:   Nelwyn Salisbury MD   Signed by:   Nelwyn Salisbury MD on 05/03/2009   Method used:   Print then Give to Patient   RxID:   [id number]

## 2010-04-10 NOTE — Progress Notes (Signed)
Summary: refill med  Phone Note Call from Patient Call back at Home Phone [phone]   Caller: Patient Call For: fry Summary of Call: refill cymbalta 60mg  and hydrocodone 5/500 Pharmacy number is [phone] and fax is [phone] Initial call taken by: Alfred Levins, CMA,  February 29, 2008 8:53 AM  Follow-up for Phone Call        call in Cymbalta as above, #30 with 11 rf, also Vicodin as above, #60 with 5 rf Follow-up by: Nelwyn Salisbury MD,  February 29, 2008 10:26 AM      Prescriptions: VICODIN 5-500 MG  TABS (HYDROCODONE-ACETAMINOPHEN) 1 every 6 hours as needed pain  #60 x 5   Entered by:   Alfred Levins, CMA   Authorized by:   Nelwyn Salisbury MD   Signed by:   Alfred Levins, CMA on 02/29/2008   Method used:   Telephoned to ...       [facility]  [place]. [phone]* (retail)       [address]       [place], [place]  [address]       Ph: [phone]       Fax: [phone]   RxID:   [id number] CYMBALTA 60 MG  CPEP (DULOXETINE HCL) once daily  #30 x 5   Entered by:   Alfred Levins, CMA   Authorized by:   Nelwyn Salisbury MD   Signed by:   Alfred Levins, CMA on 02/29/2008   Method used:   Telephoned to ...       [facility]  [place]. [phone]* (retail)       [address]       [place], [place]  [address]       Ph: [phone]       Fax: [phone]   RxID:   [id number]

## 2010-04-10 NOTE — Letter (Signed)
Summary: Medical problems & Job Loss  Medical problems & Job Loss   Imported By: Maryln Gottron 01/03/2008 16:56:10  _____________________________________________________________________  External Attachment:    Type:   Image     Comment:   External Document

## 2010-04-10 NOTE — Progress Notes (Signed)
Summary: LMTCB  Phone Note Call from Patient Call back at [phone]   Caller: Patient Summary of Call: We received a letter today dated 02-25-09 from her asking about med refills. I have not seen her in over a year, so I cannot refill her meds without seeing her. Have her schedule an OV to follow up. Initial call taken by: Nelwyn Salisbury MD,  February 26, 2009 1:55 PM  Follow-up for Phone Call        Left message to call back on home number. Tried the [phone] number but it was disconnect. Follow-up by: Romualdo Bolk, CMA (AAMA),  February 26, 2009 4:42 PM     Appended Document: LMTCB Pt given Dr. Claris Che message.  She will call back for an appt.

## 2010-04-10 NOTE — Progress Notes (Signed)
Summary: please sned her diagnosis to her   Phone Note Call from Patient   Caller: pt live Call For: Clent Ridges  Summary of Call: is trying to get a copy of her diagnosis for a court hearing on  Wednesday.  She lives in [place] and she cant get the form  to come through on her fax.  She has faxed to [facility] that it is ok to send her diagnosis with symptoms.  Could Dr Clent Ridges write her diagnosis with symptoms on a piece of letter head and mail it to her @ [address] [place] [address] her phone # is   [phone] or [phone]   and fax directly to her attorney @ [phone] Tomasa Hosteller  Initial call taken by: Roselle Locus,  December 21, 2007 11:32 AM  Follow-up for Phone Call        I cannot certify disability for fibromyalgia. I suggest she see a rheumatologist who deals with this on a regular basis. as far as office notes to support diagnoses, etc., ask Medical records Follow-up by: Nelwyn Salisbury MD,  December 21, 2007 2:39 PM

## 2010-05-15 ENCOUNTER — Telehealth: Payer: Self-pay | Admitting: *Deleted

## 2010-05-15 NOTE — Telephone Encounter (Signed)
patient  Is requesting a refill of methocarbamol 750mg , 1 tab qid, [facility] [place]

## 2010-05-16 MED ORDER — METHOCARBAMOL 750 MG PO TABS: 750.0000 mg | ORAL_TABLET | Freq: Four times a day (QID) | ORAL | Status: AC

## 2010-05-16 NOTE — Telephone Encounter (Signed)
Call in #[phone] with 2 rf 

## 2010-07-25 NOTE — Op Note (Signed)
Laurie Schaefer, Laurie Schaefer               ACCOUNT NO.:  [id number]   MEDICAL RECORD NO.:  [id number]          PATIENT TYPE:  OBV   LOCATION:  0098                         FACILITY:  [facility]   PHYSICIAN:  Erasmo Leventhal, M.D.DATE OF BIRTH:  Jul 11, 1955   DATE OF PROCEDURE:  04/24/2006  DATE OF DISCHARGE:                               OPERATIVE REPORT   PREOPERATIVE DIAGNOSIS:  Unstable left ankle injury, left ankle  Maisonneuve fracture.   POSTOPERATIVE DIAGNOSIS:  Unstable left ankle injury, left ankle  Maisonneuve fracture.   PROCEDURE:  Left ankle open reduction/internal fixation of medial  malleolus, closed reduction of posterior medial malleolus, closed  reduction of stabilization of syndesmosis via two transverse syndesmosis  screws, C-arm stress radiography.   SURGEON:  Erasmo Leventhal, M.D.   ASSISTANT:  Jaquelyn Bitter. Chabon, PA-C.   ANESTHESIA:  General.   ESTIMATED BLOOD LOSS:  Less than 10 cc.   DRAINS:  None.   COMPLICATIONS:  None.   TOURNIQUET TIME:  Forty-five minutes at 300 mmHg.   DISPOSITION:  To PACU stable.   OPERATIVE DETAILS:  Patient has been counseled in the holding area and  in the emergency room with her family.  She has been taken to the  operating room, placed under general anesthesia.  Antibiotics were  given.  The left lower extremity was elevated.  It was prepped with  DuraPrep and draped in a sterile fashion.  An Esmarch tourniquet was  inflated to 300 mmHg.  Attention was directed to the medial side, where  a small skin incision was made over the medial malleolus.  The saphenous  vein and nerve were retracted out of the way.  The fracture was opened.  It was irrigated of soft tissue and hematoma.  The joint was also  copiously irrigated.  The medial malleolus was reduced anatomically and  securely fixed with two A-O cancellous cannulated screws.  The C-arm  confirmed excellent reduction of the medial malleolus and placed with  implants.  Attention was directed to the lateral side.  The ankle mortis  and tibial plafond were identified with the C-arm.  A small skin  incision was made on the distal fibula.  Dissected was carried to the  subcutaneous tissue and down the periosteum.  At this point in time,  again, the A-O 4.0 cannulated screws were utilized.  Two guide pins were  placed.  Modification was made utilizing the C-arm, found to be in good  position.  Measured appropriately.  The distal screw was then placed.  Given her an excellent tricortical fixation and a noninterfragmentary-  type effacement.   Attention was directed proximally.  At this time, we also placed another  screw.  The guidepin at this time bent and broke.  We utilized the C-arm  to confirm that it was well-seated inside the bone.  There were no  adverse complications or problems.  Another guide pin was placed  adjacent to this, and another screw was placed appropriately.  At this  time, we had two well-placed parallel syndesmosis screws which were  placed with ankle mortis, reduced anatomically.  Given excellent  compression across the syndesmosis, again not in a fragmentary manner.  We checked with the C-arm, AP, __________ , and lateral.  Multiple  views.  We had excellent reduction of the fracture and the joint.  Closure of the syndesmosis and stabilization of the above.  The wounds  were copiously irrigated.  I did not think it would be appropriate to  try to removed the pin, due to the fact that it was not causing  complications and problems.  I think the morbidity associated with  trying to remove that significantly outweighed any potential problems or  complications.  Based upon that, everything looked at this point in time  excellent.  The wounds were irrigated.  The subcu was closed with  Vicryl.  The skin was closed with nylon.  We used 10 cc of 0.5% Marcaine  at the end of the case to anesthetize the skin edges and place into  the  joint for local ankle block.  Sterile dressing was applied.  The  tourniquet was deflated.  Another 1 gm of Ancef was given intravenously.  She tolerated the procedure well.  There were no complications or  problems.  She was gently awakened and taken from the operating room to  the PACU in stable condition.   To help with surgical time and placement of the implantation, etc., Mr.  Brett Canales Chabon's, PA-C, assistance was needed.           ______________________________  Erasmo Leventhal, M.D.     RAC/MEDQ  D:  04/24/2006  T:  04/24/2006  Job:  [id number]

## 2010-07-25 NOTE — Discharge Summary (Signed)
Laurie Schaefer, Laurie Schaefer               ACCOUNT NO.:  [id number]   MEDICAL RECORD NO.:  [id number]          PATIENT TYPE:  OBV   LOCATION:  1514                         FACILITY:  [facility]   PHYSICIAN:  Erasmo Leventhal, M.D.DATE OF BIRTH:  04-14-1955   DATE OF ADMISSION:  04/24/2006  DATE OF DISCHARGE:  04/26/2006                               DISCHARGE SUMMARY   ADMITTING DIAGNOSIS:  Left ankle Maisonneuve fracture.   DISCHARGE DIAGNOSIS:  Left ankle Maisonneuve fracture.   OPERATION:  Open reduction and internal fixation left ankle fracture.   BRIEF HISTORY:  This is a 55 year old lady with history of a fall with a  Maisonneuve-type fracture of her left ankle, who presented to the  emergency room on April 24, 2006.  She was subsequently taken to the  operating room for open reduction and internal fixation of her fracture.   LABORATORY VALUES:  Admission CBC showed the hematocrit low at 35.7,  monocytes 12.  Admission BMET was within normal limits.   COURSE IN THE HOSPITAL:  The patient tolerated the operative procedure  well.  First postoperative day, she had significant pain and was  continued on her PCA morphine.  Her vital signs were stable.  She was  afebrile.  Neurovascular status intact in her toes.  Second  postoperative day, pain was better.  Vital signs were stable.  Cast was  fitting well.  Neurovascular status in toes.  No passive stretch signs.  Lungs clear.  Heart sounds normal.  Plans were made to discharge the  patient after physical therapy that day.   CONDITION ON DISCHARGE:  Improved.   DISCHARGE MEDICATIONS:  1. Percocet 5/325, #80, 1-2 q.4-6 h. x4 days, and then 1 q.4-6 h.      p.r.n. pain.  2. Robaxin 500, 1 p.o. q.8 h. p.r.n. spasm.   DISCHARGE INSTRUCTIONS:  She is to be nonweightbearing.  She is to  elevate her ankle as high as her heart.  She is to use ice packs to the  calf.  Again, no weightbearing on the left foot.  She is to call the  office  today for a recheck on Monday next week, or call sooner p.r.n.  problems.  She is also to take calcium with vitamin D.      Jaquelyn Bitter. Chabon, P.A.    ______________________________  Erasmo Leventhal, M.D.    SJC/MEDQ  D:  04/26/2006  T:  04/26/2006  Job:  [id number]

## 2010-07-25 NOTE — Op Note (Signed)
Laurie Schaefer, Laurie Schaefer               ACCOUNT NO.:  [id number]   MEDICAL RECORD NO.:  [id number]          PATIENT TYPE:  AMB   LOCATION:  DSC                          FACILITY:  [facility]   PHYSICIAN:  Laurie Schaefer. Dalldorf, M.D.DATE OF BIRTH:  30-Oct-1955   DATE OF PROCEDURE:  12/08/2005  DATE OF DISCHARGE:                                 OPERATIVE REPORT   PREOPERATIVE DIAGNOSES:  1. Right shoulder impingement.  2. Right shoulder acromioclavicular spur.  3. Right shoulder partial rotator cuff tear.   POSTOPERATIVE DIAGNOSIS:  1. Right shoulder impingement.  2. Right shoulder acromioclavicular spur.  3. Right shoulder partial rotator cuff tear.   PROCEDURE:  1. Right shoulder arthroscopic acromioplasty.  2. Right shoulder arthroscopic partial clavicectomy.  3. Right shoulder arthroscopic debridement.   ANESTHESIA:  General.   ATTENDING SURGEON:  Dr. Jerl Schaefer.   ASSISTANT:  Lindwood Qua, P.A.   INDICATIONS FOR PROCEDURE:  The patient is a 55 year old woman with several  years of right shoulder pain.  This has persisted despite oral anti-  inflammatories and an exercise program.  She has also had two subacromial  injections both of which did afford her transient relief.  She underwent an  MRI scan about 2 years ago which showed a partial-thickness rotator cuff  tear and things consistent with impingement related to the shape of her  acromion and AC joint.  She has pain which limits her ability to rest and  use her arm and she is offered an arthroscopy.  Informed operative consent  was obtained and after discussion of the possible complications of reaction  to anesthesia and infection as well as shoulder stiffness.   FINDINGS AND PROCEDURE:  Under general anesthesia, an arthroscopy of the  right shoulder was performed.  The glenohumeral joint appeared benign with  no degenerative changes and the biceps tendon and rotator cuff appeared  benign in this area.  In the subacromial  space, she had bursitis and a  partial-thickness cuff tear in a position of impingement, but this  constituted less than 10% of the thickness of the cuff and a debridement was  performed.  She had a prominent subacromial morphology addressed with the  acromioplasty back to a flat surface.  She also had a large spur at the  distal clavicle addressed with a partial clavicectomy. Bryna Colander  assisted throughout and was invaluable to the completion of the case in that  he helped pass instruments so I could perform the procedure.  He also closed  portals to help minimize OR time.   DESCRIPTION OF PROCEDURE:  The patient was taken to the operating suite  where general anesthetic was applied without difficulty.  She was positioned  in beach-chair position and prepped and draped in normal sterile fashion.  After the administration of preop IV Kefzol, an arthroscopy of the right  shoulder was performed through a total of 2 portals.  The findings were as  noted above and procedure consisted of an acromioplasty along with partial  clavicectomy and debridement.  The acromioplasty was done with the bur in  the lateral position followed  by transfer of the bur to the posterior  position.  The clavicular spur was removed from the lateral and posterior  portals and this was basically a co-plane to remove the impinging portion  without a formal AC decompression.  The rotator cuff was debrided in the  area of impingement where there was a partial-thickness tear but certainly  nothing needing formal repair.  The shoulder was thoroughly irrigated  followed by placement of Marcaine with epinephrine and morphine.  Simple  sutures of nylon were used to loosely reapproximate the portals followed by  Adaptic and a dry gauze dressing with tape.  Estimated blood loss and  intraoperative fluids can be obtained from anesthesia records.   DISPOSITION:  The patient was extubated in the operating room and taken to   the recovery room in stable addition.  She was to go home the same day and  followup in the office in less than one week . I will contact her by phone  tonight.      Laurie Schaefer Laurie Schaefer, M.D.  Electronically Signed     PGD/MEDQ  D:  12/08/2005  T:  12/09/2005  Job:  [id number]

## 2010-07-25 NOTE — Op Note (Signed)
NAME:  Laurie Schaefer, Laurie Schaefer                         ACCOUNT NO.:  [id number]   MEDICAL RECORD NO.:  [id number]                   PATIENT TYPE:  AMB   LOCATION:  DAY                                  [facility]ACILITY:  [facility]   PHYSICIAN:  Huel Cote, M.D.              DATE OF BIRTH:  1955/12/12   DATE OF PROCEDURE:  12/15/2002  DATE OF DISCHARGE:                                 OPERATIVE REPORT   PREOPERATIVE DIAGNOSES:  1. Abnormal uterine bleeding.  2. Polyp on ultrasound.   POSTOPERATIVE DIAGNOSES:  1. Abnormal uterine bleeding.  2. Polyp on ultrasound.  3. Small polyp on posterior wall of uterus.   OPERATION/PROCEDURE:  1. Dilatation and curettage.  2. Hysteroscopy.  3. Polypectomy.   SURGEON:  Huel Cote, M.D.   ANESTHESIA:  General with laryngeal airway.   FINDINGS:  Small posterior wall polyp, approximately 45 mm.  Normal cavity.  Otherwise without fibroids noted.   FLUIDS REPLACED:  No significant hysteroscopic fluid deficit.  Less than 100  mL.   URINE OUTPUT:  100 mL straight catheterization prior to procedure.   ESTIMATED BLOOD LOSS:  Minimal.   DESCRIPTION OF PROCEDURE:  The patient was taken to the operating room where  general anesthesia was obtained without difficulty.  She was then prepped  and draped in the normal sterile fashion in the dorsal lithotomy position.  A speculum was then placed into the vagina.  The cervix was identified,  grasped at the anterior lip with the tenaculum and serially dilated to 33  with the [facility] dilators.  This was somewhat of a dilation given that the  cervix was quite tight.  With the dilation complete, the hysteroscope was  introduced into the uterine cavity and all aspects were inspected closely.  Polyp was identified on the posterior wall of the uterus and was resected  with the resectoscope in two passes.  Portion of this polyp was lost in the  suction and the other portion was sent with the endometrial curettings  given  its small size.  Tuberosity were clearly identified and normal.  The  endometrium overall appeared normal and a D&C was also performed and those  curettings sent to pathology.  At the conclusion of the procedure, the  cavity appeared hemostatic and no other abnormalities were seen.  Therefore,  the hysteroscope  was removed and the tenaculum removed from the cervix.  All sponge and  instrument counts were correct.  The cervix had no bleeding noted actively.  Speculum was removed and the patient was awakened and taken to the recovery  room in stable condition.                                                Huel Cote, M.D.    KR/MEDQ  D:  12/15/2002  T:  12/15/2002  Job:  [id number]

## 2010-07-25 NOTE — H&P (Signed)
Laurie Schaefer, Laurie Schaefer               ACCOUNT NO.:  [id number]   MEDICAL RECORD NO.:  [id number]          PATIENT TYPE:  OBV   LOCATION:  0098                         [facility]ACILITY:  [facility]   PHYSICIAN:  Erasmo Leventhal, M.D.DATE OF BIRTH:  07/24/1955   DATE OF ADMISSION:  04/24/2006  DATE OF DISCHARGE:                              HISTORY & PHYSICAL   CHIEF COMPLAINT:  Left ankle fracture.   HISTORY OF PRESENT ILLNESS:  This is a 55 year old lady with a history  of a fall with a fracture of her medial and posterior malleoli and  proximal fibula compatible with a masuami fracture with a widening of  her mortise.  She came to emergency room, today, and had the above  findings; and is now to be taken to the OR for open reduction internal  fixation of her fracture.  The surgery, risks, benefits, and aftercare  were discussed in detail; the patient questions invited and answered;  and surgery to go ahead as scheduled.   PAST MEDICAL HISTORY/ALLERGIES:  Drug allergies none.   CURRENT MEDICATIONS:  Calcium, soy, and Advil.   PREVIOUS SURGERIES INCLUDE:  Rotator cuff repair of her right shoulder,  bilateral TMJ surgery and septoplasty.   SERIOUS MEDICAL ILLNESSES INCLUDE:  Depression.   FAMILY HISTORY:  Positive for arthritis.   SOCIAL HISTORY:  The patient is divorced.  She works as a Child psychotherapist.  She does not smoke and drinks occasionally.   REVIEW OF SYSTEMS:  CENTRAL NERVOUS SYSTEM:  Negative for headache,  blurred vision, or dizziness.  PULMONARY:  No shortness of breath, PND,  or orthopnea.  CARDIOVASCULAR:  No chest pain or palpitations.  GI:  Negative for ulcers or hepatitis.  GU: Negative for urinary tract  difficulty.  Musculoskeletal:  Positive as in the HPI.   PHYSICAL EXAM:  VITAL SIGNS:  Respirations 16, pulse 84 and regular.  GENERAL APPEARANCE:  This is a well-nourished lady in moderate distress  with the left ankle.  HEENT: Head is normocephalic, atraumatic.   Pupils equal, round, react to  light.  Throat without injection.  NECK:  Supple without adenopathy.  Carotids 2+ without bruits.  The ears  show clear TM bilaterally.  NECK:  Supple without adenopathy.  Carotids 2+ without bruit.  CHEST: Clear to auscultation.  No rales or rhonchi.  Respirations 16.  HEART:  Regular rate and rhythm at 84 beats without murmur.  ABDOMEN:  Soft with active bowel sounds.  No masses, organomegaly.  No  pain to AP or lateral pelvic compression.  EXTREMITIES:  No pain to the right lower extremity or bilateral upper  extremities.  The left lower extremity shows masuami  fracture with  widened mortise.  Neurovascular status is intact.  Dorsalis pedis and  posterior tibialis pulses are 2+.  NEUROLOGIC:  Patient alert and oriented to time, place, and person.  Cranial nerves II-XII grossly intact.   X-RAYS SHOW:  Masuami fracture left ankle.   IMPRESSION:  Masuami fracture left ankle.   PLAN:  ORIF of left ankle.      Jaquelyn Bitter. Chabon, P.A.  ______________________________  Erasmo Leventhal, M.D.    SJC/MEDQ  D:  04/24/2006  T:  04/24/2006  Job:  [id number]

## 2010-07-25 NOTE — Op Note (Signed)
[facility]  Patient:    Laurie Schaefer, Laurie Schaefer Visit Number: [id number] MRN: [id number]          Service Type: DSU Location: [facility] Attending Physician:  Marcene Corning Dictated by:   Lubertha Basque. Jerl Santos, M.D. Proc. Date: 04/26/01 Admit Date:  04/26/2001                             Operative Report  PREOPERATIVE DIAGNOSIS:  Right extensor carpi radialis brevis tendinitis.  POSTOPERATIVE DIAGNOSIS:  Right extensor carpi radialis brevis tendinitis.  PROCEDURE:  Right extensor carpi radialis brevis release.  ANESTHESIA:  Bier block.  SURGEON:  Lubertha Basque. Jerl Santos, M.D.  ASSISTANT: Prince Rome, P.A.  INDICATION FOR PROCEDURE:  The patient is a 55 year old woman with years of bilateral elbow pain on the lateral aspect of each.  She has tried exercises, oral anti-inflammatories, and several injections, all of which have afforded her transient relief.  At this point she is offered ECRB release on the most painful side, which is the right currently.  Informed operative consent was obtained after discussion of the possible complications of, reaction to anesthesia, and infection.  DESCRIPTION OF PROCEDURE:  The patient was taken to the operating suite, where Bier block anesthetic was applied without difficulty.  She was positioned supine and prepped and draped in normal sterile fashion.  After the administration of preop IV antibiotics, a small lateral incision was made with dissection down to the ECRB origin.  This was split longitudinally.  Some devitalized tissue on the undersurface of this tendon and its attachment point was excised.  The anterior portion of the tendon was allowed to split distally a few millimeters.  The bone of the lateral epicondyle was roughened with a rongeur.  A single stitch of Vicryl was used to repair the longitudinal split in the extensor tendon origin.  The wound was then irrigated, followed by reapproximation of the  subcutaneous tissues with 2-0 undyed Vicryl and skin with nylon.  Adaptic was placed on the wound, and a dry gauze dressing with a loose Ace wrap was applied.  Estimated blood loss and intraoperative fluids can be obtained from anesthesia records, as can accurate tourniquet time.  The tourniquet was deflated at the end of the case, and her fingers became pink and warm immediately.  DISPOSITION:  The patient was taken to the recovery room in stable condition. Plans were for her to go home the same day and follow up in the office in less than a week.  I will contact her by phone tonight. Dictated by:   Lubertha Basque Jerl Santos, M.D. Attending Physician:  Marcene Corning DD:  04/26/01 TD:  04/26/01 Job: [id number]

## 2010-07-25 NOTE — H&P (Signed)
NAME:  Laurie Schaefer, Laurie Schaefer                         ACCOUNT NO.:  [id number]   MEDICAL RECORD NO.:  [id number]                   PATIENT TYPE:  AMB   LOCATION:  DAY                                  FACILITY:  [facility]   PHYSICIAN:  Huel Cote, M.D.              DATE OF BIRTH:  01/22/1956   DATE OF ADMISSION:  12/15/2002  DATE OF DISCHARGE:                                HISTORY & PHYSICAL   HISTORY OF PRESENT ILLNESS:  The patient is a 55 year old nulligravida  female who presents for an operative hysteroscopy/probable polypectomy for  an ongoing problem with abnormal uterine bleeding and menorrhagia.  The  patient had a workup prior to this including an endometrial biopsy, which  was normal, a trial of oral contraceptives to regulate her cycle, which has  not significantly improved the bleeding, and a sonohysterogram which  revealed a probable 5-mm polyp in the posterior aspect of the endometrial  lining.  Uterus and ovaries otherwise were normal on the ultrasound.  The  patient also had blood work done which was perimenopausal in nature and  thyroid check which was normal.  She has decided she wishes to proceed with  surgical treatment and probable polypectomy to improve her bleeding  symptoms.   PAST MEDICAL HISTORY:  Past medical history is significant for borderline  chronic hypertension.   PAST SURGICAL HISTORY:  A TMJ surgery in the 1980s, a deviated septum in the  1980s and the repair of an elbow joint.   PAST GYNECOLOGICAL HISTORY:  No abnormal Pap smear.   PAST OBSTETRICAL HISTORY:  No previous pregnancies.   ALLERGIES:  She has no known drug allergies.   MEDICATIONS:  Medications include:  1. Wellbutrin 150 mg.  2. Currently, she is also taking birth control pills with Ovcon to help     control her bleeding.  3. The patient desires also BuSpar, which she will begin for anxiety     problems.   FAMILY HISTORY:  The family history is significant for osteoporosis in  her  mother, no colon cancer, breast cancer or heart disease.   PHYSICAL EXAMINATION:  VITAL SIGNS:  On physical exam, the patient's weight  is 156 pounds, blood pressure 138/90.  CARDIAC:  Regular rate and rhythm.  LUNGS:  Lungs are clear.  ABDOMEN:  Abdomen is soft and nontender.  BREASTS:  Exam has no masses, discharge or adenopathy noted.  PELVIC:  Exam has normal external genitalia.  Cervix is normal with no  lesions.  The adnexa have no masses.  Uterus is mid-position, normal size.  RECTAL:  Exam is heme-negative.   LABORATORY DATA:  Hemoglobin is normal.   ASSESSMENT AND PLAN:  The patient was counseled as to the risks and benefits  of proceeding with surgical intervention including bleeding and infection  and possible uterine perforation as performed with a hysteroscopy.  The  patient is currently continuing Ovcon oral contraceptives,  however, has not  successfully been able to control her bleeding and still continues to have  some abnormal bleeding throughout the pill pack, has cut down slightly on  flow and cramping.  The patient also reports significant increase in stress  and anxiety, for which she will begin BuSpar.  She understands the risks and  benefits of the procedure, as stated, and desires to proceed.                                               Huel Cote, M.D.    KR/MEDQ  D:  12/14/2002  T:  12/14/2002  Job:  [id number]

## 2015-01-22 ENCOUNTER — Telehealth: Payer: Self-pay | Admitting: Family Medicine

## 2015-01-22 NOTE — Telephone Encounter (Signed)
Sorry but I am too full, she will need to establish with someone else

## 2015-01-22 NOTE — Telephone Encounter (Signed)
Pt is in the process of moving back to [place] from [place] and was a previous pt of yours >08 years ago. Pt states she is having issues with fibromyalgia and wants to see you as soon as possible to get pain meds. Will you accept her back?   Pt also wants you to know she is also on anti depressants.

## 2015-01-23 NOTE — Telephone Encounter (Signed)
Pt is aware.  

## 2015-02-06 ENCOUNTER — Telehealth: Payer: Self-pay | Admitting: Family Medicine

## 2015-02-06 NOTE — Telephone Encounter (Signed)
Pt last seen dr fry about 59 years old and would like to re-est with dr fry. Can I sch?

## 2015-02-07 NOTE — Telephone Encounter (Signed)
She asked me already several weeks ago, and I said NO I am too full. Thanks

## 2015-02-11 NOTE — Telephone Encounter (Signed)
lmom for pt to call back

## 2015-02-12 NOTE — Telephone Encounter (Signed)
Spoke with pt and let her know of  Dr Clent RidgesFry decision.

## 2015-04-15 ENCOUNTER — Ambulatory Visit: Payer: Self-pay | Admitting: Adult Health

## 2015-05-21 ENCOUNTER — Other Ambulatory Visit: Payer: Self-pay | Admitting: Pain Medicine

## 2015-05-21 DIAGNOSIS — M542 Cervicalgia: Secondary | ICD-10-CM

## 2015-05-21 DIAGNOSIS — M545 Low back pain: Secondary | ICD-10-CM

## 2015-05-26 ENCOUNTER — Other Ambulatory Visit: Payer: Self-pay

## 2015-06-27 ENCOUNTER — Other Ambulatory Visit: Payer: Self-pay

## 2015-08-27 ENCOUNTER — Telehealth: Payer: Self-pay

## 2015-08-27 ENCOUNTER — Encounter: Payer: Self-pay | Admitting: Family Medicine

## 2015-08-27 ENCOUNTER — Ambulatory Visit (INDEPENDENT_AMBULATORY_CARE_PROVIDER_SITE_OTHER): Payer: Medicare Other | Admitting: Family Medicine

## 2015-08-27 VITALS — BP 128/80 | HR 68 | Temp 98.0°F | Resp 18 | Wt 185.4 lb

## 2015-08-27 DIAGNOSIS — Z8739 Personal history of other diseases of the musculoskeletal system and connective tissue: Secondary | ICD-10-CM

## 2015-08-27 DIAGNOSIS — F329 Major depressive disorder, single episode, unspecified: Secondary | ICD-10-CM

## 2015-08-27 DIAGNOSIS — Z7189 Other specified counseling: Secondary | ICD-10-CM

## 2015-08-27 DIAGNOSIS — S1096XA Insect bite of unspecified part of neck, initial encounter: Secondary | ICD-10-CM

## 2015-08-27 DIAGNOSIS — W57XXXA Bitten or stung by nonvenomous insect and other nonvenomous arthropods, initial encounter: Secondary | ICD-10-CM

## 2015-08-27 DIAGNOSIS — R5383 Other fatigue: Secondary | ICD-10-CM

## 2015-08-27 DIAGNOSIS — F319 Bipolar disorder, unspecified: Secondary | ICD-10-CM | POA: Diagnosis not present

## 2015-08-27 DIAGNOSIS — I10 Essential (primary) hypertension: Secondary | ICD-10-CM

## 2015-08-27 DIAGNOSIS — Z7689 Persons encountering health services in other specified circumstances: Secondary | ICD-10-CM

## 2015-08-27 DIAGNOSIS — F32A Depression, unspecified: Secondary | ICD-10-CM

## 2015-08-27 LAB — COMPREHENSIVE METABOLIC PANEL
ALT: 20 U/L (ref 6–29)
AST: 17 U/L (ref 10–35)
Albumin: 4.2 g/dL (ref 3.6–5.1)
Alkaline Phosphatase: 59 U/L (ref 33–130)
BUN: 19 mg/dL (ref 7–25)
CO2: 25 mmol/L (ref 20–31)
Calcium: 9.5 mg/dL (ref 8.6–10.4)
Chloride: 104 mmol/L (ref 98–110)
Creat: 0.74 mg/dL (ref 0.50–0.99)
Glucose, Bld: 93 mg/dL (ref 65–99)
Potassium: 4.5 mmol/L (ref 3.5–5.3)
Sodium: 139 mmol/L (ref 135–146)
Total Bilirubin: 0.7 mg/dL (ref 0.2–1.2)
Total Protein: 6.8 g/dL (ref 6.1–8.1)

## 2015-08-27 LAB — CBC WITH DIFFERENTIAL/PLATELET
Basophils Absolute: 75 cells/uL (ref 0–200)
Basophils Relative: 1 %
Eosinophils Absolute: 225 cells/uL (ref 15–500)
Eosinophils Relative: 3 %
HCT: 40.7 % (ref 35.0–45.0)
Hemoglobin: 13.9 g/dL (ref 11.7–15.5)
Lymphocytes Relative: 24 %
Lymphs Abs: 1800 cells/uL (ref 850–3900)
MCH: 30 pg (ref 27.0–33.0)
MCHC: 34.2 g/dL (ref 32.0–36.0)
MCV: 87.9 fL (ref 80.0–100.0)
MPV: 10.8 fL (ref 7.5–12.5)
Monocytes Absolute: 825 cells/uL (ref 200–950)
Monocytes Relative: 11 %
Neutro Abs: 4575 cells/uL (ref 1500–7800)
Neutrophils Relative %: 61 %
Platelets: 334 10*3/uL (ref 140–400)
RBC: 4.63 MIL/uL (ref 3.80–5.10)
RDW: 14.3 % (ref 11.0–15.0)
WBC: 7.5 10*3/uL (ref 4.0–10.5)

## 2015-08-27 LAB — TSH: TSH: 0.65 mIU/L

## 2015-08-27 NOTE — Progress Notes (Signed)
Subjective:    Patient ID: Laurie Schaefer, female    DOB: 10/09/1955, 60 y.o.   MRN: [id number]  HPI Chief Complaint  Patient presents with  . tick bites    4 tick bites about 4 weeks ago, achy all over, joints hurts, eyeball throbbing, headaches, no energy   She is new to the practice. States she moved here from [place] approximately 2 weeks ago. She was seeing Dr. Melida QuitterWayne Wilson for primary care there. She has multiple complaints today. Reports finding 4 ticks on her body approximately 1 month ago. States 2 ticks were on the back of her neck, about the size of a thumbtack and 1 of those she had to pull off. States the one tick had been on her for 2-3 days but cannot be sure how long. She states it was engorged.  She found a smaller tick in her pubic area that was about the size of a tip of a q-tip. She states she has not been feeling well for 3 weeks and is concerned that she has Lyme disease. Reports she feels like a mild case of the "flu".  Complaints of multiple joint aches, malaise, scratchy throat, fatigue, frontal headaches and eyes "throbbing".  Reports having fibromylagia and has a usual level of fatigue but states she is feeling more tired than her baseline.   She states she has been out of her psych medications for at least 1 week. States Trintellix is too expensive for her now. She states she was prescribed this medication by her PCP in [place]. States she she has been taking it for about a year and it was helping with her with depression and bipolar. She later states that she was taking Brintellix. She does not know dose of the medication. She states she would like for me to prescribe or give samples of something "comparable". She did not bring medical records from her previous PCP or prescription bottles. She denies SI or HI.   States she also has COPD. States she is using oxygen at night.  States she [facility] to [facility] and was diagnosed with COPD and that her  counselor put her on oxygen at night 2L . She states she has never been evaluated by a pulmonologist. Denies cough or orthopnea.   Reports history of Bipolar 1 Disorder, osteoarthritis. She states she takes lamictal for mood.   She plans to schedule appointment with [facility] in [place] for depression and bipolar.   States Preferred pain management is managing chronic pain and prescribing muscle relaxant and narcotic.   Denies fever, chills, dizziness,chest pain, shortness of breath, visual disturbances, nausea, vomiting, diarrhea.    States she used to live here and saw Dr. Clent RidgesFry prior to leaving the area.  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  Lives alone. No family in the area. She has some family in [place] still.  Her church is her support network.   Denies alcohol or drug use. Smokes 1-2 cigarettes every other week.   Reviewed allergies, medications, past medical, surgical, and social history.    Review of Systems Pertinent positives and negatives in the history of present illness.     Objective:   Physical Exam BP 128/80 mmHg  Pulse 68  Temp(Src) 98 F (36.7 C) (Oral)  Resp 18  Wt 185 lb 6.4 oz (84.097 kg)  SpO2 97%  Alert and in no distress.  Pharyngeal area is normal.  Neck is supple without adenopathy or thyromegaly and normal ROM. Cardiac exam shows a regular sinus rhythm without murmurs or gallops. Lungs are clear to auscultation. Skin warm and dry, no rash, a 0.5 cm scab with minimal surrounding erythema noted to hairline on posterior neck, no drainage or induration, no target lesion.  Extremities without erythema, edema, normal ROM, sensation and strength bilaterally. CN II-IX intact. Normal gait.       Assessment & Plan:  Tick bite of neck, initial encounter - Plan: CBC with Differential/Platelet, Lyme Ab/Western Blot Reflex  Other fatigue - Plan: CBC with Differential/Platelet,  Comprehensive metabolic panel, TSH  Encounter to establish care  Essential hypertension - Plan: CBC with Differential/Platelet, Comprehensive metabolic panel  H/O fibromyalgia  Depression  Bipolar I disorder (HCC)  Discussed that based on history and physical, reassured her that she most likely does not have Lyme disease. She is adamant that she have blood work to rule this out however. Lyme panel ordered. Suspect that fatigue and malaise and arthralgias are related to chronic illnesses fibromyalgia and osteoarthritis. She does not appear infectious. Will order labs to rule out underlying etiology.   She has plan to contact [facility] center for depression and bipolar management, she states she prefers to do this first and then she will let me know if she would like for me to refer her to Psychiatry or Psychologist. Her mood and judgment appears stable today and I am not concerned that she is in any harm. Discussed that it is not appropriate for me to prescribe psych medications today without any documentation or her past medications. She is ok with this plan.  Recommend she continue seeing pain management for chronic pain.  Will need to further address questionable history of COPD and use of oxygen at night. Plan to order PFTs at next visit once I receive documentation. Her breathing and lung sounds today are normal.  Blood pressure is within normal range today. Continue current medication for this.  Will follow up pending lab results. Also discussed that I need records from previous PCP and [facility] in regards to chronic medical conditions and medications.  Spent a minimum of 45 minutes with patient face to face and at least 50% was in counseling and coordination of care.

## 2015-08-27 NOTE — Telephone Encounter (Signed)
At check out, pt states she wanted to wait on signing release as she is not sure if she wants to switch PCP to our practice yet. She wants to talk to her previous practitioner before she decides. Medical Record Release and envelope with our address given to pt to return release to office if she decides to transfer. Trixie Rude/RLB

## 2015-08-28 ENCOUNTER — Ambulatory Visit
Admission: RE | Admit: 2015-08-28 | Discharge: 2015-08-28 | Disposition: A | Discharge status: Home / Self Care | Payer: Medicare Other | Source: Ambulatory Visit | Attending: Pain Medicine | Admitting: Pain Medicine

## 2015-08-28 DIAGNOSIS — M545 Low back pain: Secondary | ICD-10-CM

## 2015-08-28 DIAGNOSIS — M542 Cervicalgia: Secondary | ICD-10-CM

## 2015-08-28 LAB — LYME AB/WESTERN BLOT REFLEX: B burgdorferi Ab IgG+IgM: <0.9 Index (ref <0.90)

## 2015-08-30 ENCOUNTER — Telehealth: Payer: Self-pay | Admitting: Internal Medicine

## 2015-08-30 NOTE — Telephone Encounter (Signed)
The patient will need to decide if she is establishing care with me and our office before I can refer her. She will need to schedule an appointment for further testing of her lung function before I can refer her to a pulmonologist.

## 2015-08-30 NOTE — Telephone Encounter (Signed)
Spoke with patient and advised her that we would have to see her again for additional testing before referring her a specialist. Pt was advised that she needed to decide if we were going to be her pcp before we could do any referrals as well. She was going to let us know and get back with us

## 2015-08-30 NOTE — Telephone Encounter (Signed)
Pt states you were going to refer her to pulmonology but according to know you wanted her back and pt states she just wants to be referred back and not come in here. Also she needs the ov notes to go to [facility] so she can schedule appt. She is still not sure if she is switching over to us yet as she is going [facility] [place] [facility] to talk to him to decide so that's why we have not gotten records yet.

## 2015-08-31 ENCOUNTER — Encounter (HOSPITAL_COMMUNITY): Payer: Self-pay | Admitting: Emergency Medicine

## 2015-08-31 ENCOUNTER — Emergency Department (HOSPITAL_COMMUNITY): Payer: Medicare Other

## 2015-08-31 ENCOUNTER — Emergency Department (HOSPITAL_COMMUNITY)
Admission: EM | Admit: 2015-08-31 | Discharge: 2015-08-31 | Disposition: A | Discharge status: Home / Self Care | Payer: Medicare Other | Attending: Dermatology | Admitting: Dermatology

## 2015-08-31 DIAGNOSIS — Z5321 Procedure and treatment not carried out due to patient leaving prior to being seen by health care provider: Secondary | ICD-10-CM | POA: Diagnosis not present

## 2015-08-31 DIAGNOSIS — G43909 Migraine, unspecified, not intractable, without status migrainosus: Secondary | ICD-10-CM | POA: Insufficient documentation

## 2015-08-31 MED ORDER — METOCLOPRAMIDE HCL 5 MG/ML IJ SOLN
10.0000 mg | Freq: Once | INTRAMUSCULAR | Status: AC
  Administered 2015-08-31: 10 mg via INTRAMUSCULAR
  Filled 2015-08-31: qty 2

## 2015-08-31 NOTE — ED Notes (Signed)
Charge RN at bedside to medicate patient.

## 2015-08-31 NOTE — ED Notes (Signed)
Pt c/o frontal HA onset 1700 last night, between 1700 and 0100 pt has taken Hydrocodone 10, Muscle relaxant x 2, Ativan 1mg , Ambien, Hydrocodone 10. Pt sobbing loudly, pt does stop to speak, does appear to have slurred speech, appears very anxious. Pt requesting Phenergan in triage. No neuro deficits noted. Pt is a pain clinic patient, initially unable to open eyes with triage door open, after triage pt able to ambulate to BR in NAD.

## 2015-08-31 NOTE — ED Notes (Signed)
Pt states "I cannot lay here in this torture chamber any longer"; pt states "I wish to sign out AMA and go home"; pt advised that she does not need to drive after taking all the medications she took PTA; pt states " I drove here after taking these medications and I will drive home and will be be just fine"; Pt states that she will talk to [employer] about getting a ride home

## 2015-08-31 NOTE — ED Notes (Signed)
Pt again walking to the BR

## 2015-10-21 ENCOUNTER — Encounter: Payer: Self-pay | Admitting: Emergency Medicine

## 2015-11-14 ENCOUNTER — Institutional Professional Consult (permissible substitution): Payer: Medicare Other | Admitting: Pulmonary Disease

## 2015-11-18 ENCOUNTER — Institutional Professional Consult (permissible substitution): Payer: Medicare Other | Admitting: Pulmonary Disease

## 2015-11-20 LAB — PULMONARY FUNCTION TEST

## 2015-12-16 ENCOUNTER — Institutional Professional Consult (permissible substitution): Payer: Medicare Other | Admitting: Pulmonary Disease

## 2015-12-18 ENCOUNTER — Telehealth: Payer: Self-pay | Admitting: Pulmonary Disease

## 2015-12-18 ENCOUNTER — Ambulatory Visit (INDEPENDENT_AMBULATORY_CARE_PROVIDER_SITE_OTHER): Payer: Medicare Other | Admitting: Pulmonary Disease

## 2015-12-18 ENCOUNTER — Encounter: Payer: Self-pay | Admitting: Pulmonary Disease

## 2015-12-18 ENCOUNTER — Other Ambulatory Visit: Payer: Medicare Other

## 2015-12-18 VITALS — BP 148/92 | HR 92 | Ht 65.0 in | Wt 193.0 lb

## 2015-12-18 DIAGNOSIS — Z8739 Personal history of other diseases of the musculoskeletal system and connective tissue: Secondary | ICD-10-CM

## 2015-12-18 DIAGNOSIS — G4700 Insomnia, unspecified: Secondary | ICD-10-CM

## 2015-12-18 DIAGNOSIS — Z6832 Body mass index (BMI) 32.0-32.9, adult: Secondary | ICD-10-CM

## 2015-12-18 DIAGNOSIS — J439 Emphysema, unspecified: Secondary | ICD-10-CM

## 2015-12-18 DIAGNOSIS — J449 Chronic obstructive pulmonary disease, unspecified: Secondary | ICD-10-CM

## 2015-12-18 DIAGNOSIS — E6609 Other obesity due to excess calories: Secondary | ICD-10-CM

## 2015-12-18 MED ORDER — BUDESONIDE-FORMOTEROL FUMARATE 160-4.5 MCG/ACT IN AERO: 2.0000 | INHALATION_SPRAY | Freq: Two times a day (BID) | RESPIRATORY_TRACT | 0 refills | Status: DC

## 2015-12-18 MED ORDER — BUDESONIDE-FORMOTEROL FUMARATE 160-4.5 MCG/ACT IN AERO: 2.0000 | INHALATION_SPRAY | Freq: Two times a day (BID) | RESPIRATORY_TRACT | 6 refills | Status: DC

## 2015-12-18 MED ORDER — ALBUTEROL SULFATE HFA 108 (90 BASE) MCG/ACT IN AERS: 2.0000 | INHALATION_SPRAY | Freq: Four times a day (QID) | RESPIRATORY_TRACT | 6 refills | Status: DC | PRN

## 2015-12-18 NOTE — Patient Instructions (Signed)
It was a pleasure taking care of you today!  You are diagnosed with Chronic Obstructive Pulmonary Disease or COPD.  COPD is a preventable and treatable disease that makes it difficult to empty air out of the lungs (airflow obstruction).  This can lead to shortness of breath.   Sometimes, when you have a lung infection, this can make your breathing worse, and will cause you to have a COPD flare-up or an acute exacerbation of COPD. Please call your primary care doctor or the office if you are having a COPD flare-up.   Smoking makes COPD worse.   Make sure you use your medications for COPD -- Maintenance medications : Symbicort, 160/4.5, 2 puffs twice a day.  Rescue medications: Albuterol 2 puffs every 4 hours as needed for shortness of breath.   Please rinse your mouth each time you use your maintenance medication.  Please call the office if you are having issues with your medications  We will get you a breathing test, blood test.  Return to clinic in 6-8 weeks with Dr. Christene Slatese Dios or NP

## 2015-12-18 NOTE — Telephone Encounter (Signed)
Pt saw AD today. She is wanting to switch care to RB due to the fact that someone she knows sees him. Pt felt like AD did not spend enough time with her. Pt was also upset that AD did not give her Ambien. AD has already given the okay for pt to change MDs.  RB - please advise. Thanks.

## 2015-12-18 NOTE — Progress Notes (Signed)
Subjective:    Patient ID: Laurie Schaefer, female    DOB: 04-20-1955, 60 y.o.   MRN: [id number]  HPI   This is the case of Laurie Schaefer, 60 y.o. Female, who was referred by Laurie OldHenry Althizar PA for dyspnea.  Patient is a non smoker. She was diagnosed with COPD 18 mos ago by PCP in [place]. She had exertional dyspnea. She was started on Ventolin which she used every other day. She was started O2 3L at HS. No other meds. COPD was fiarly controlled.  She was living in [place] until recently.  She moved to [place] several mos ago.  Since moving to [place], she has not made an appointment to have a PCP. Her COPD has been uncontrolled since moving to [place]. She had a bronchitis 2 mos ago which lasted for 1-2 months. Bronchitis eventually improved with prednisone. Last prednisone intake was 1 month and her COPD is slowly getting worse since being off prednisone.    She uses ventolin every other day. No other meds.   Pt had a sleep study in 2015 which was (-).   Pt has chronic cough, recurent bronchitis x 3-4 yrs. She was living in a farm in [place], moved to [place] in July 2017. She was living in a house with a lot of mold and mildew in [place].   She has not been admitted to hospital recently. Has been to urgent care couple times last 2-3 months.   She has fibromylagia and takes Norco 2-3 times/day.   Review of Systems  Constitutional: Negative.  Negative for fever and unexpected weight change.  HENT: Positive for sore throat and voice change. Negative for congestion, dental problem, ear pain, nosebleeds, postnasal drip, rhinorrhea, sinus pressure, sneezing and trouble swallowing.   Eyes: Negative.  Negative for redness and itching.  Respiratory: Positive for cough, chest tightness, shortness of breath and wheezing.   Cardiovascular: Negative.  Negative for palpitations and leg swelling.  Gastrointestinal: Negative.  Negative for nausea and vomiting.  Endocrine: Negative.   Genitourinary: Negative.   Negative for dysuria.  Musculoskeletal: Positive for arthralgias and myalgias. Negative for joint swelling.  Skin: Negative.  Negative for rash.  Allergic/Immunologic: Positive for environmental allergies.  Neurological: Positive for light-headedness and headaches.  Hematological: Negative.  Does not bruise/bleed easily.  Psychiatric/Behavioral: Negative.  Negative for dysphoric mood. The patient is not nervous/anxious.    Past Medical History:  Diagnosis Date  . Arthritis   . Chronic neck pain   . Depression   . Fibromyalgia   . Headache   . Hypertension    (-)CA, DVT.   No family history on file.  Father had CVA. Mother has OA and depression.   Past Surgical History:  Procedure Laterality Date  . FRACTURE SURGERY      Social History   Social History  . Marital status: Single    Spouse name: N/A  . Number of children: N/A  . Years of education: N/A   Occupational History  . Not on file.   Social History Main Topics  . Smoking status: Former Smoker    Packs/day: 0.80    Years: 0.25    Types: Cigarettes    Quit date: 03/09/1970  . Smokeless tobacco: Not on file     Comment: 1 cig every 2 weeks  . Alcohol use No  . Drug use: No  . Sexual activity: Not on file   Other Topics Concern  . Not on file   Social  History Narrative  . No narrative on file   No children. She is a Investment banker, corporate. (-) ETOH.   Allergies  Allergen Reactions  . Codeine Nausea Only     Outpatient Medications Prior to Visit  Medication Sig Dispense Refill  . benazepril (LOTENSIN) 40 MG tablet Take 40 mg by mouth daily.    Marland Kitchen estradiol (ESTRACE) 1 MG tablet Take 1 mg by mouth daily.    Marland Kitchen Fexofenadine HCl (MUCINEX ALLERGY PO) Take 1 tablet by mouth daily as needed (allergies/congestion.).     Marland Kitchen gabapentin (NEURONTIN) 600 MG tablet Take 600 mg by mouth daily.     Marland Kitchen HYDROcodone-acetaminophen (NORCO) 10-325 MG tablet Take 1 tablet by mouth every 6 (six) hours as needed for moderate pain.      . isosorbide mononitrate (IMDUR) 30 MG 24 hr tablet Take 30 mg by mouth daily.  1  . lamoTRIgine (LAMICTAL) 25 MG tablet Take 25 mg by mouth daily.    . methocarbamol (ROBAXIN) 750 MG tablet Take 750 mg by mouth 2 (two) times daily.    . pantoprazole (PROTONIX) 40 MG tablet Take 40 mg by mouth daily.  0  . vortioxetine HBr (TRINTELLIX) 10 MG TABS Take 1 tablet by mouth daily.    Marland Kitchen zolpidem (AMBIEN) 10 MG tablet Take 10 mg by mouth at bedtime as needed for sleep.    Marland Kitchen LORazepam (ATIVAN) 2 MG tablet Take 2 mg by mouth every 6 (six) hours as needed for anxiety.     No facility-administered medications prior to visit.    Meds ordered this encounter  Medications  . albuterol (PROVENTIL HFA;VENTOLIN HFA) 108 (90 Base) MCG/ACT inhaler    Sig: Inhale 2 puffs into the lungs every 6 (six) hours as needed for wheezing or shortness of breath.    Dispense:  1 Inhaler    Refill:  6  . budesonide-formoterol (SYMBICORT) 160-4.5 MCG/ACT inhaler    Sig: Inhale 2 puffs into the lungs 2 (two) times daily.    Dispense:  1 Inhaler    Refill:  6  . budesonide-formoterol (SYMBICORT) 160-4.5 MCG/ACT inhaler    Sig: Inhale 2 puffs into the lungs 2 (two) times daily.    Dispense:  1 Inhaler    Refill:  0    Order Specific Question:   Lot Number?    Answer:   [id number]    Order Specific Question:   Expiration Date?    Answer:   01/06/2017        Objective:   Physical Exam  Vitals:  Vitals:   12/18/15 1519  BP: (!) 148/92  Pulse: 92  SpO2: 96%  Weight: 193 lb (87.5 kg)  Height: 5\' 5"  (1.651 m)    Constitutional/General:  Pleasant, well-nourished, well-developed, not in any distress,  Comfortably seating.  Well kempt  Body mass index is 32.12 kg/m. Wt Readings from Last 3 Encounters:  12/18/15 193 lb (87.5 kg)  08/31/15 170 lb (77.1 kg)  08/27/15 185 lb 6.4 oz (84.1 kg)    HEENT: Pupils equal and reactive to light and accommodation. Anicteric sclerae. Normal nasal mucosa.   No oral   lesions,  mouth clear,  oropharynx clear, no postnasal drip. (-) Oral thrush. No dental caries.  Airway - Mallampati class III  Neck: No masses. Midline trachea. No JVD, (-) LAD. (-) bruits appreciated.  Respiratory/Chest: Grossly normal chest. (-) deformity. (-) Accessory muscle use.  Symmetric expansion. (-) Tenderness on palpation.  Resonant on percussion.  Diminished BS on  both lower lung zones. (-) wheezing, crackles, rhonchi.  Good ae. No wheezing heard. Pt stated she was wheezing earlier.  (-) egophony  Cardiovascular: Regular rate and  rhythm, heart sounds normal, no murmur or gallops, no peripheral edema  Gastrointestinal:  Normal bowel sounds. Soft, non-tender. No hepatosplenomegaly.  (-) masses.   Musculoskeletal:  Normal muscle tone. Normal gait.   Extremities: Grossly normal. (-) clubbing, cyanosis.  (-) edema  Skin: (-) rash,lesions seen.   Neurological/Psychiatric : alert, oriented to time, place, person. Normal mood and affect          Assessment & Plan:  COPD (chronic obstructive pulmonary disease) (HCC) Nonsmoker, diagnosed with COPD 18 months ago was she was still living in [place]. She was just given albuterol MDI. She moved to [facility] in July, 2017. Since moving to [facility], her COPD has flared up. She has been to urgent care couple times. Prednisone and told her COPD but she stopped using prednisone a month now. Her COPD starting to flare up again. Still has albuterol which she uses every day. She was also given 3 L oxygen at bedtime at [facility], 18 months ago. Sleep study in 2015 was normal allegedly.  I want to believe patient has COPD even without the PFTs. I extensively discussed with her the need for maintenance medications to control her COPD. She had wanted to be placed on prednisone again but I told her I wanted to hold off on that and give maintenance medications a chance. I told her prednisone has side effects long-term.  Plan : We  extensively discussed the diagnosis, pathophysiology, and medications  for COPD.   Patient will need : PFT, ABG Chest XRay PA-L > wanted to get a chest x-ray but she told me she had an exit done at [facility] the last month. We will need to retrieve the image and the report. Alpha one antitrypsin level  > on follow up Nocturnal oximetry > will hold off  We will start patient on  Symbicort, 160/4.5, 2 puffs twice a day. I told her to give [place] a call if she is not better despite twice a day use of Symbicort. At that point, we may add Spiriva. Will needs a stress well. Patient was instructed to rinse mouth each time he/she uses Symbicort.   Influenza vaccine -- she wants to hold off. Pneumococcal vaccine -- was hold off.  Patient was instructed to call the office if he/she has issues with the medications.   Patient was instructed to call the office if he/she is having issues with shortness of breath (i.e. COPD exacerbation).   Patient was instructed to call the office if with increased shortness of breath, cough, sputum production, fevers. Patient may need to be seen at the office for work-up.   Counseled patient on the importance of NOT smoking.   Regarding the oxygen, I think it's best not to rock the boat at this point. She currently has 3 L oxygen at bedtime. It was ordered by her primary care doctor 18 months ago in [place]. I told her to continue using that for now. Will probably need ONO at some point to determine if 3L is enough.  Will need a walk test on f/u. Her sleep study was allegedly negative in 2015.  INSOMNIA, CHRONIC Patient has chronic insomnia. Her sleep is all over the place. She wanted Ambien. I told her I don't necessarily prescribe Ambien. I told her we need to discuss about her sleep habits and  hygiene which hopefully will help with her insomnia. If she is persistent with Ambien, we need to at least repeat a sleep study to determine whether she doesn't have  sleep apnea. Alternatively, her primary care doctor can prescribe Ambien.   H/O fibromyalgia Patient takes Norco 3 times a day.  Obesity Weight reduction    Thank you very much for letting me participate in this patient's care. Please do not hesitate to give me a call if you have any questions or concerns regarding the treatment plan.   Patient will follow up with me in 6-8 weeks.     Pollie Meyer, MD 12/19/2015   9:52 AM Pulmonary and Critical Care Medicine [facility] Pager: [phone] Office: [phone], Fax: [phone]

## 2015-12-19 ENCOUNTER — Telehealth: Payer: Self-pay | Admitting: Pulmonary Disease

## 2015-12-19 DIAGNOSIS — J449 Chronic obstructive pulmonary disease, unspecified: Secondary | ICD-10-CM | POA: Insufficient documentation

## 2015-12-19 DIAGNOSIS — E669 Obesity, unspecified: Secondary | ICD-10-CM | POA: Insufficient documentation

## 2015-12-19 NOTE — Assessment & Plan Note (Signed)
Patient takes Norco 3 times a day.

## 2015-12-19 NOTE — Assessment & Plan Note (Signed)
Nonsmoker, diagnosed with COPD 18 months ago was she was still living in [place]. She was just given albuterol MDI. [place] [facility] in July, [place]. Since [place] [facility], her COPD has flared up. She has been to urgent care couple times. Prednisone and told her COPD but she stopped using prednisone a month now. Her COPD starting to flare up again. Still has albuterol which she uses every day. She was also given 3 L [facility] at [facility], 18 months ago. Sleep study in 2015 was normal allegedly.  I want to believe patient has COPD even without the PFTs. I extensively discussed with her the need for maintenance medications to control her COPD. She had wanted to be placed on prednisone again but I told her I wanted to hold off on that and give maintenance medications a chance. I told her prednisone has side effects long-term.  Plan : We extensively discussed the diagnosis, pathophysiology, and medications  for COPD.   Patient will need : PFT, ABG Chest XRay PA-L > wanted to get a chest x-ray but she told me she had an exit [facility] the last month. We will need to retrieve the image and the report. Alpha one antitrypsin level  6MWT > on follow up Nocturnal oximetry > will hold off  We will start patient on  Symbicort, 160/4.5, 2 puffs twice a day. I told her to give us a call if she is not better despite twice a day use of Symbicort. At that point, we may add Spiriva. Will needs a stress well. Patient was instructed to rinse mouth each time he/she uses Symbicort.   Influenza vaccine -- she wants to hold off. Pneumococcal vaccine -- was hold off.  Patient was instructed to call the office if he/she has issues with the medications.   Patient was instructed to call the office if he/she is having issues with shortness of breath (i.e. COPD exacerbation).   Patient was instructed to call the office if with increased shortness of breath, cough, sputum production, fevers.  Patient may need to be seen at the office for work-up.   Counseled patient on the importance of NOT smoking.   Regarding the oxygen, I think it's best not to rock the boat at this point. She currently has 3 L oxygen at bedtime. It was ordered by her primary care doctor 18 months ago in [place]. I told her to continue using that for now. Will probably need ONO at some point to determine if 3L is enough.  Will need a walk test on f/u. Her sleep study was allegedly negative in 2015.

## 2015-12-19 NOTE — Telephone Encounter (Signed)
   Pt had a CXR done at [facility] in July or August 2017.   Can we pls retrieve the reports and the films?   Thanks.   Pollie MeyerJ. Angelo A de Dios, MD 12/19/2015, 10:10 AM [place] [facility] Pager [phone] After 3 pm or if no answer, call [phone]

## 2015-12-19 NOTE — Telephone Encounter (Signed)
Spoke with Angelique BlonderDenise in Medical [facility] @ [facility]. She is faxing over cxr report and spirometry. They are mailing cxr films.   AD - Lorain ChildesFYI

## 2015-12-19 NOTE — Assessment & Plan Note (Signed)
Weight reduction 

## 2015-12-19 NOTE — Progress Notes (Signed)
Laurie OldHenry Althizar, PA with [facility] made a this referral to pulmonologist. Patient has not followed up with our office and it is apparent that she has established elsewhere for a PCP.

## 2015-12-19 NOTE — Telephone Encounter (Signed)
Called and spoke with pt and she stated that she did not want to change doctors due to the fact the AD would not give her ambien.  Pt was very upset that this was interpreted by anyone in this office.  She stated that she felt that she was rushed through her appt with AD and that they did not have the  Re pore that she felt she should have with a doctor.  Pt wanted to make sure that RB knew this and that the reason she wanted to change was from what is above and that her friend recommended RB and spoke very highly of his care.  Pt requested that I send this back to RB to make him aware of this.

## 2015-12-19 NOTE — Assessment & Plan Note (Signed)
Patient has chronic insomnia. Her sleep is all over the place. She wanted Ambien. I told her I don't necessarily prescribe Ambien. I told her we need to discuss about her sleep habits and hygiene which hopefully will help with her insomnia. If she is persistent with Ambien, we need to at least repeat a sleep study to determine whether she doesn't have sleep apnea. Alternatively, her primary care doctor can prescribe Ambien.

## 2015-12-19 NOTE — Telephone Encounter (Signed)
I don't think we should make a change. I agree with Dr DeDios assessment, and in this setting would not be comfortable prescribing and managing ambien. I think she should work with and stay with Dr Clayborn BignesseDios.

## 2015-12-20 NOTE — Telephone Encounter (Signed)
Then its OK with me, as long as ok with Dr Clayborn BignessdeDios

## 2015-12-20 NOTE — Telephone Encounter (Signed)
AD, are you okay with the switch as well? Thanks.

## 2015-12-20 NOTE — Telephone Encounter (Signed)
   I am OK with it.  Thanks!  Laurie Schaefer

## 2015-12-23 NOTE — Telephone Encounter (Signed)
Spoke with pt. She has been scheduled to see RB on 01/29/16 at 10:45am. Nothing further was needed.

## 2015-12-25 ENCOUNTER — Ambulatory Visit
Admission: RE | Admit: 2015-12-25 | Discharge: 2015-12-25 | Disposition: A | Discharge status: Home / Self Care | Payer: Medicare Other | Source: Ambulatory Visit | Attending: Anesthesiology | Admitting: Anesthesiology

## 2015-12-25 ENCOUNTER — Other Ambulatory Visit: Payer: Self-pay | Admitting: Anesthesiology

## 2015-12-25 DIAGNOSIS — M542 Cervicalgia: Secondary | ICD-10-CM

## 2015-12-27 ENCOUNTER — Ambulatory Visit (INDEPENDENT_AMBULATORY_CARE_PROVIDER_SITE_OTHER): Payer: Medicare Other | Admitting: Emergency Medicine

## 2015-12-27 DIAGNOSIS — J449 Chronic obstructive pulmonary disease, unspecified: Secondary | ICD-10-CM

## 2015-12-27 LAB — PULMONARY FUNCTION TEST
DL/VA % pred: 92 %
DL/VA: 4.37 ml/min/mmHg/L
DLCO cor % pred: 61 %
DLCO cor: 14.42 ml/min/mmHg
DLCO unc % pred: 61 %
DLCO unc: 14.59 ml/min/mmHg
FEF 25-75 Post: 0.9 L/sec
FEF 25-75 Pre: 0.99 L/sec
FEF2575-%Change-Post: -9 %
FEF2575-%Pred-Post: 40 %
FEF2575-%Pred-Pre: 44 %
FEV1-%Change-Post: -2 %
FEV1-%Pred-Post: 39 %
FEV1-%Pred-Pre: 40 %
FEV1-Post: 0.97 L
FEV1-Pre: 1 L
FEV1FVC-%Change-Post: 9 %
FEV1FVC-%Pred-Pre: 104 %
FEV6-%Change-Post: -9 %
FEV6-%Pred-Post: 35 %
FEV6-%Pred-Pre: 39 %
FEV6-Post: 1.09 L
FEV6-Pre: 1.21 L
FEV6FVC-%Pred-Post: 103 %
FEV6FVC-%Pred-Pre: 103 %
FVC-%Change-Post: -10 %
FVC-%Pred-Post: 34 %
FVC-%Pred-Pre: 38 %
FVC-Post: 1.09 L
FVC-Pre: 1.23 L
Post FEV1/FVC ratio: 89 %
Post FEV6/FVC ratio: 100 %
Pre FEV1/FVC ratio: 81 %
Pre FEV6/FVC Ratio: 100 %

## 2015-12-27 LAB — ALPHA-1 ANTITRYPSIN PHENOTYPE: A-1 Antitrypsin: 139 mg/dL (ref 83–199)

## 2015-12-27 NOTE — Telephone Encounter (Signed)
Records receive from [facility] --we have not received any films or disc as of yet.  Records are in your look at to be reviewed.

## 2015-12-30 ENCOUNTER — Telehealth: Payer: Self-pay | Admitting: Pulmonary Disease

## 2015-12-30 NOTE — Telephone Encounter (Signed)
   Received paperwork from [facility].  CXR done on 10/21/15 > (-) infiltrate seen. (+) scoliosis to the R. Awaiting for disc.  Spirometry done 11/20/15 > FEV1  0.33  13%,  FEV1/FEV6  26%  ??; poor study.  No FEV1/FVC ratio seen.    Pollie MeyerJ. Angelo A de Dios, MD 12/30/2015, 10:51 PM [place] [facility] Critical [facility] Pager [phone] After 3 pm or if no answer, call [phone]

## 2016-01-01 NOTE — Progress Notes (Signed)
Spoke with pt. And told her her results per AD, no further questions at this time

## 2016-01-29 ENCOUNTER — Ambulatory Visit (INDEPENDENT_AMBULATORY_CARE_PROVIDER_SITE_OTHER): Payer: Medicare Other | Admitting: Emergency Medicine

## 2016-01-29 ENCOUNTER — Encounter: Payer: Self-pay | Admitting: Emergency Medicine

## 2016-01-29 DIAGNOSIS — Z23 Encounter for immunization: Secondary | ICD-10-CM

## 2016-01-29 DIAGNOSIS — G4700 Insomnia, unspecified: Secondary | ICD-10-CM | POA: Diagnosis not present

## 2016-01-29 DIAGNOSIS — J383 Other diseases of vocal cords: Secondary | ICD-10-CM | POA: Insufficient documentation

## 2016-01-29 MED ORDER — VALSARTAN 320 MG PO TABS: 320.0000 mg | ORAL_TABLET | Freq: Every day | ORAL | 2 refills | Status: DC

## 2016-01-29 NOTE — Assessment & Plan Note (Signed)
Suspect that she will need a repeat PSG at some point in the future.

## 2016-01-29 NOTE — Progress Notes (Signed)
Subjective:    Patient ID: Adalberto ColePamela S Meacham, female    DOB: 07-Aug-1955, 60 y.o.   MRN: [id number]  HPI 60 yo former smoker (20+ pk-yrs), hx of fibromyalgia, COPD dx in setting exertional SOB. She has been seen by Dr deDios in our ofice. Per his notes has had PSG 2015 that was negative for OSA. She underwent PFT on 12/27/15 that I have reviewed, show significant restriction with superimposed obstruction based on normal FEV1/FVC ration and curve of her flow volume loop. DLCO decreased but corrects to normal for Va.   Her COPD is characterized by difficult getting air in. She has very low exercise tolerance, gets winded. Has to stop when walking around her block. She has congestion in her head and throat. Often exposure based. Has had multiple flares in the last years treated with prednisone. Dr DeDios asked her to start Symbicort in 10/17, she has not done so. She is on oxygen 3L/min at night and sometimes with exertion. She uses albuterol 2-3x a day, benefits from it. Has fexafenadine to use prn. She is on protonix qd.  She is also on benazepril for 3 yrs.   She has insomnia and wanted [place] - Dr DeDios felt that she would need repeat PSG before initiating meds for insomnia.    Review of Systems As per HPI  Past Medical History:  Diagnosis Date  . Arthritis   . Chronic neck pain   . Depression   . Fibromyalgia   . Headache   . Hypertension      No family history on file.   Social History   Social History  . Marital status: Single    Spouse name: N/A  . Number of children: N/A  . Years of education: N/A   Occupational History  . Not on file.   Social History Main Topics  . Smoking status: Former Smoker    Packs/day: 0.80    Years: 0.25    Types: Cigarettes    Quit date: 03/09/1970  . Smokeless tobacco: Not on file     Comment: 1 cig every 2 weeks  . Alcohol use No  . Drug use: No  . Sexual activity: Not on file   Other Topics Concern  . Not on file   Social History  Narrative  . No narrative on file     Allergies  Allergen Reactions  . Codeine Nausea Only     Outpatient Medications Prior to Visit  Medication Sig Dispense Refill  . albuterol (PROVENTIL HFA;VENTOLIN HFA) 108 (90 Base) MCG/ACT inhaler Inhale 2 puffs into the lungs every 6 (six) hours as needed for wheezing or shortness of breath. 1 Inhaler 6  . benazepril (LOTENSIN) 40 MG tablet Take 40 mg by mouth daily.    . budesonide-formoterol (SYMBICORT) 160-4.5 MCG/ACT inhaler Inhale 2 puffs into the lungs 2 (two) times daily. 1 Inhaler 6  . budesonide-formoterol (SYMBICORT) 160-4.5 MCG/ACT inhaler Inhale 2 puffs into the lungs 2 (two) times daily. 1 Inhaler 0  . estradiol (ESTRACE) 1 MG tablet Take 1 mg by mouth daily.    Marland Kitchen. Fexofenadine HCl (MUCINEX ALLERGY PO) Take 1 tablet by mouth daily as needed (allergies/congestion.).     Marland Kitchen. gabapentin (NEURONTIN) 600 MG tablet Take 600 mg by mouth daily.     Marland Kitchen. HYDROcodone-acetaminophen (NORCO) 10-325 MG tablet Take 1 tablet by mouth every 6 (six) hours as needed for moderate pain.     . isosorbide mononitrate (IMDUR) 30 MG 24 hr tablet Take  30 mg by mouth daily.  1  . lamoTRIgine (LAMICTAL) 25 MG tablet Take 25 mg by mouth daily.    . methocarbamol (ROBAXIN) 750 MG tablet Take 750 mg by mouth 2 (two) times daily.    . pantoprazole (PROTONIX) 40 MG tablet Take 40 mg by mouth daily.  0  . vortioxetine HBr (TRINTELLIX) 10 MG TABS Take 1 tablet by mouth daily.    Marland Kitchen. zolpidem (AMBIEN) 10 MG tablet Take 10 mg by mouth at bedtime as needed for sleep.     No facility-administered medications prior to visit.         Objective:   Physical Exam Vitals:   01/29/16 1054  BP: 134/80  Pulse: 92  SpO2: 96%  Weight: 189 lb (85.7 kg)  Height: 5\' 5"  (1.651 m)   Gen: Pleasant, overwt woman, in no distress,  normal affect  ENT: No lesions,  mouth clear,  oropharynx clear, no postnasal drip, some insp noise, stridor  Neck: No JVD, no TMG, no carotid  bruits  Lungs: No use of accessory muscles, clear without rales or rhonchi  Cardiovascular: RRR, heart sounds normal, no murmur or gallops, no peripheral edema  Musculoskeletal: No deformities, no cyanosis or clubbing  Neuro: alert, non focal  Skin: Warm, no lesions or rashes       Assessment & Plan:  INSOMNIA, CHRONIC Suspect that she will need a repeat PSG at some point in the future.  COPD (chronic obstructive pulmonary disease) (HCC)  We will start Spiriva respimat, 2 puffs once a day every day until next visit.  Use your albuterol (Ventolin) 2 puffs up to every 4 hours if needed for shortness of breath. You may not need this often if the Spiriva is effective.  Walking oximetry today shows that you do not need to use    Vocal cord dysfunction She has a component of UA sx based on exam, spirometry, complaints.  Need to stop ACE-I, treat GERD and allergies aggressively.   Please Stop benazepril. We will start valsartan 320mg  once a day Work on getting your PCP  Start taking your fexafenadine allergy once a day every day.  Start using fluticasone nasal spray, 2 sprays each nostril daily until next visit.  Continue your pantoprazole 40mg  daily.   Follow with Dr Delton CoombesByrum in 4-6 weeks to discuss how you are doing on the new medications.    Levy Pupaobert Susi Goslin, MD, PhD 01/29/2016, 11:32 AM [place] [facility] [phone] or if no answer [phone]

## 2016-01-29 NOTE — Assessment & Plan Note (Signed)
She has a component of UA sx based on exam, spirometry, complaints.  Need to stop ACE-I, treat GERD and allergies aggressively.   Please Stop benazepril. We will start valsartan 320mg  once a day Work on getting your PCP  Start taking your fexafenadine allergy once a day every day.  Start using fluticasone nasal spray, 2 sprays each nostril daily until next visit.  Continue your pantoprazole 40mg  daily.   Follow with Dr Delton CoombesByrum in 4-6 weeks to discuss how you are doing on the new medications.

## 2016-01-29 NOTE — Assessment & Plan Note (Signed)
  We will start Spiriva respimat, 2 puffs once a day every day until next visit.  Use your albuterol (Ventolin) 2 puffs up to every 4 hours if needed for shortness of breath. You may not need this often if the Spiriva is effective.  Walking oximetry today shows that you do not need to use

## 2016-01-29 NOTE — Patient Instructions (Addendum)
Please Stop benazepril. We will start valsartan 320mg  once a day Work on getting your PCP  We will start Spiriva respimat, 2 puffs once a day every day until next visit.  Use your albuterol (Ventolin) 2 puffs up to every 4 hours if needed for shortness of breath. You may not need this often if the Spiriva is effective.  Walking oximetry today shows that you do not need to use  Start taking your fexafenadine allergy once a day every day.  Start using fluticasone nasal spray, 2 sprays each nostril daily until next visit.  Continue your pantoprazole 40mg  daily.   Follow with Dr Delton CoombesByrum in 4-6 weeks to discuss how you are doing on the new medications.

## 2016-01-29 NOTE — Addendum Note (Signed)
Addended by: Jaynee EaglesLEMONS, LINDSAY C on: 01/29/2016 11:58 AM   Modules accepted: Orders

## 2016-03-13 ENCOUNTER — Encounter: Payer: Self-pay | Admitting: Emergency Medicine

## 2016-03-13 ENCOUNTER — Ambulatory Visit (INDEPENDENT_AMBULATORY_CARE_PROVIDER_SITE_OTHER): Payer: Medicare Other | Admitting: Emergency Medicine

## 2016-03-13 VITALS — BP 140/90 | HR 82 | Ht 65.0 in | Wt 195.2 lb

## 2016-03-13 DIAGNOSIS — J309 Allergic rhinitis, unspecified: Secondary | ICD-10-CM | POA: Insufficient documentation

## 2016-03-13 DIAGNOSIS — F319 Bipolar disorder, unspecified: Secondary | ICD-10-CM

## 2016-03-13 DIAGNOSIS — J301 Allergic rhinitis due to pollen: Secondary | ICD-10-CM

## 2016-03-13 DIAGNOSIS — J438 Other emphysema: Secondary | ICD-10-CM | POA: Diagnosis not present

## 2016-03-13 MED ORDER — TIOTROPIUM BROMIDE MONOHYDRATE 2.5 MCG/ACT IN AERS: 2.0000 | INHALATION_SPRAY | Freq: Every day | RESPIRATORY_TRACT | 6 refills | Status: DC

## 2016-03-13 MED ORDER — BUDESONIDE-FORMOTEROL FUMARATE 160-4.5 MCG/ACT IN AERO: 2.0000 | INHALATION_SPRAY | Freq: Two times a day (BID) | RESPIRATORY_TRACT | 6 refills | Status: DC

## 2016-03-13 MED ORDER — PANTOPRAZOLE SODIUM 40 MG PO TBEC: 40.0000 mg | DELAYED_RELEASE_TABLET | Freq: Every day | ORAL | 6 refills | Status: DC

## 2016-03-13 MED ORDER — FLUTICASONE PROPIONATE 50 MCG/ACT NA SUSP: 2.0000 | Freq: Every day | NASAL | 5 refills | Status: DC

## 2016-03-13 NOTE — Addendum Note (Signed)
Addended by: Jaynee EaglesLEMONS, Mitsuru Dault C on: 03/13/2016 12:23 PM   Modules accepted: Orders

## 2016-03-13 NOTE — Progress Notes (Signed)
Subjective:    Patient ID: Laurie ColePamela S Sipe, female    DOB: 04/24/1955, 61 y.o.   MRN: [id number]  HPI 61 yo former smoker (20+ pk-yrs), hx of fibromyalgia, COPD dx in setting exertional SOB. She has been seen by Dr deDios in our ofice. Per his notes has had PSG 2015 that was negative for OSA. She underwent PFT on 12/27/15 that I have reviewed, show significant restriction with superimposed obstruction based on normal FEV1/FVC ration and curve of her flow volume loop. DLCO decreased but corrects to normal for Va.   Her COPD is characterized by difficult getting air in. She has very low exercise tolerance, gets winded. Has to stop when walking around her block. She has congestion in her head and throat. Often exposure based. Has had multiple flares in the last years treated with prednisone. Dr DeDios asked her to start Symbicort in 10/17, she has not done so. She is on oxygen 3L/min at night and sometimes with exertion. She uses albuterol 2-3x a day, benefits from it. Has fexafenadine to use prn. She is on protonix qd.  She is also on benazepril for 3 yrs.   She has insomnia and wanted [place] - Dr DeDios felt that she would need repeat PSG before initiating meds for insomnia.   ROV 03/13/16 --  This follow-up visit for history of upper airway irritation syndrome/vocal cord dysfunction, COPD. She also has chronic insomnia. At her last visit we stopped her ACE inhibitor and changed to valsartan. We started her back on fexofenadine and added fluticasone nasal spray. She continued pantoprazole. She also started Spiriva Respimat, was on Symbicort as well.  She reports more cough, lethargy during December before holiday, thick secretions. No clear sick contact. She believes that Spiriva has helped her. Ran out yesterday. She is also out of pantoprazole, fluticasone nasal spray.  She wants a referral to pulm rehab and psychiatry.    Review of Systems As per HPI  Past Medical History:  Diagnosis Date  .  Arthritis   . Chronic neck pain   . Depression   . Fibromyalgia   . Headache   . Hypertension      No family history on file.   Social History   Social History  . Marital status: Single    Spouse name: N/A  . Number of children: N/A  . Years of education: N/A   Occupational History  . Not on file.   Social History Main Topics  . Smoking status: Former Smoker    Packs/day: 0.80    Years: 0.25    Types: Cigarettes    Quit date: 03/09/1970  . Smokeless tobacco: Not on file     Comment: 1 cig every 2 weeks  . Alcohol use No  . Drug use: No  . Sexual activity: Not on file   Other Topics Concern  . Not on file   Social History Narrative  . No narrative on file     Allergies  Allergen Reactions  . Codeine Nausea Only     Outpatient Medications Prior to Visit  Medication Sig Dispense Refill  . benazepril (LOTENSIN) 40 MG tablet Take 40 mg by mouth daily.    Marland Kitchen. Fexofenadine HCl (MUCINEX ALLERGY PO) Take 1 tablet by mouth daily as needed (allergies/congestion.).     Marland Kitchen. gabapentin (NEURONTIN) 600 MG tablet Take 600 mg by mouth daily.     Marland Kitchen. HYDROcodone-acetaminophen (NORCO) 10-325 MG tablet Take 1 tablet by mouth every 6 (six) hours  as needed for moderate pain.     . isosorbide mononitrate (IMDUR) 30 MG 24 hr tablet Take 30 mg by mouth daily.  1  . methocarbamol (ROBAXIN) 750 MG tablet Take 750 mg by mouth 2 (two) times daily.    . valsartan (DIOVAN) 320 MG tablet Take 1 tablet (320 mg total) by mouth daily. 30 tablet 2  . vortioxetine HBr (TRINTELLIX) 10 MG TABS Take 1 tablet by mouth daily.    . pantoprazole (PROTONIX) 40 MG tablet Take 40 mg by mouth daily.  0  . albuterol (PROVENTIL HFA;VENTOLIN HFA) 108 (90 Base) MCG/ACT inhaler Inhale 2 puffs into the lungs every 6 (six) hours as needed for wheezing or shortness of breath. (Patient not taking: Reported on 03/13/2016) 1 Inhaler 6  . budesonide-formoterol (SYMBICORT) 160-4.5 MCG/ACT inhaler Inhale 2 puffs into the lungs 2  (two) times daily. (Patient not taking: Reported on 03/13/2016) 1 Inhaler 0  . estradiol (ESTRACE) 1 MG tablet Take 1 mg by mouth daily.    Marland Kitchen lamoTRIgine (LAMICTAL) 25 MG tablet Take 25 mg by mouth daily.    Marland Kitchen zolpidem (AMBIEN) 10 MG tablet Take 10 mg by mouth at bedtime as needed for sleep.    . budesonide-formoterol (SYMBICORT) 160-4.5 MCG/ACT inhaler Inhale 2 puffs into the lungs 2 (two) times daily. (Patient not taking: Reported on 03/13/2016) 1 Inhaler 6  . Tiotropium Bromide Monohydrate (SPIRIVA RESPIMAT) 2.5 MCG/ACT AERS Inhale 2 puffs into the lungs daily.     No facility-administered medications prior to visit.         Objective:   Physical Exam Vitals:   03/13/16 1156  BP: 140/90  Pulse: 82  SpO2: 96%  Weight: 195 lb 3.2 oz (88.5 kg)  Height: 5\' 5"  (1.651 m)   Gen: Pleasant, overwt woman, in no distress,  normal affect  ENT: No lesions,  mouth clear,  oropharynx clear, no postnasal drip, no stridor  Neck: No JVD, no TMG, no carotid bruits  Lungs: No use of accessory muscles, clear without rales or rhonchi  Cardiovascular: RRR, heart sounds normal, no murmur or gallops, no peripheral edema  Musculoskeletal: No deformities, no cyanosis or clubbing  Neuro: alert, non focal  Skin: Warm, no lesions or rashes       Assessment & Plan:  Vocal cord dysfunction Fairly stable at this time. Continue aggressive GERD and rhinitis control.   Bipolar I disorder [facility]) She has requested referral to psychiatry in [place], will do this for her today.   COPD (chronic obstructive pulmonary disease) (HCC) Restart Spiriva and Symbicort, albuterol prn.   Allergic rhinitis Continue flonase nasal spray, allegra.,   Levy Pupa, MD, PhD 03/13/2016, 12:09 PM [facility] [phone] or if no answer [phone]

## 2016-03-13 NOTE — Assessment & Plan Note (Signed)
She has requested referral to psychiatry in [place], will do this for her today.

## 2016-03-13 NOTE — Assessment & Plan Note (Signed)
Fairly stable at this time. Continue aggressive GERD and rhinitis control.

## 2016-03-13 NOTE — Patient Instructions (Addendum)
We will refill and restart Symbicort, Spiriva Respimat, fluticasone nasal spray, pantoprazole. We will refer you to Pulmonary Rehab.  We will refer you to Psychiatry for initial evaluation and maintenance. Follow up with Dr. Delton CoombesByrum in 6 months.

## 2016-03-13 NOTE — Assessment & Plan Note (Signed)
Restart Spiriva and Symbicort, albuterol prn.

## 2016-03-13 NOTE — Assessment & Plan Note (Signed)
Continue flonase nasal spray, allegra.,

## 2016-03-30 ENCOUNTER — Encounter (HOSPITAL_COMMUNITY)
Admission: RE | Admit: 2016-03-30 | Discharge: 2016-03-30 | Disposition: A | Discharge status: Home / Self Care | Payer: Medicare Other | Source: Ambulatory Visit | Attending: Emergency Medicine | Admitting: Emergency Medicine

## 2016-03-30 ENCOUNTER — Encounter (HOSPITAL_COMMUNITY): Payer: Self-pay

## 2016-03-30 VITALS — BP 158/97 | HR 104 | Resp 18 | Ht 63.75 in | Wt 196.2 lb

## 2016-03-30 DIAGNOSIS — J438 Other emphysema: Secondary | ICD-10-CM | POA: Diagnosis present

## 2016-03-30 NOTE — Progress Notes (Signed)
Laurie ColePamela S Schaefer 61 y.o. female Pulmonary Rehab Orientation Note Patient arrived today in Cardiac and Pulmonary Rehab for orientation to Pulmonary Rehab. She was transported from the ED via wheel chair. She entered the hospital through the ED and called the pulmonary rehab department. I transported her from the ED. She does not carry portable oxygen nor has she been prescribed oxygen for home use. Color good, skin warm and dry. Patient is oriented to time and place. Patient's medical history, psychosocial health, and medications reviewed. Psychosocial assessment reveals pt lives alone. Her mother is her only immediately relative living and she is in a nursing home in [place]. Her two brother are deceased as is her father. She has no spouse or children. She is lacking in social support. Pt is currently unemployed and diabled. She intermittently manages her parents property in [place]. Pt hobbies include reading mystery novels and watching mysterys on [facility] . Pt reports her stress level is high. Areas of stress/anxiety include Health Family Finances. Pt does exhibits signs of depression. Signs of depression include anxiety, crying spells, panic and sadness and difficulty falling asleep, difficulty maintaining sleep and fatigue. She is taking her depression as prescribed. She has no thoughts of harming herself. PHQ2/9 score 4/11. She has recently been referred to psychiatry and has agreed to call this pm for an appointment. She has suffered with depression and bipolar for 10 years. She also has social anxiety. Pt shows fair  coping skills with somewhat positive outlook. She is offered emotional support, reassurance, and a referral to pastoral care. She wishes to call for a psychiatrist appointment first. Will continue to monitor and evaluate progress toward psychosocial goal(s) of verbalizing coping skills needed for her social anxiety. Physical assessment reveals heart rate is tachycardic, breath sounds clear to  auscultation, no wheezes, rales, or rhonchi. Grip strength equal, strong. Distal pulses palpable. No edema. Patient reports she does take medications as prescribed. Patient states she follows a Regular diet. The patient reports no specific efforts to gain or lose weight.. Patient's weight will be monitored closely. Demonstration and practice of PLB using pulse oximeter. Patient able to return demonstration satisfactorily. Safety and hand hygiene in the exercise area reviewed with patient. Patient voices understanding of the information reviewed. Department expectations discussed with patient and achievable goals were set. The patient shows enthusiasm about attending the program and we look forward to working with this nice lady. The patient is scheduled for a 6 min walk test on 04/03/15 and to begin exercise on 04/09/16.    45 minutes was spent on a variety of activities such as assessment of the patient, obtaining baseline data including height, weight, BMI, and grip strength, verifying medical history, allergies, and current medications, and teaching patient strategies for performing tasks with less respiratory effort with emphasis on pursed lip breathing.

## 2016-04-02 ENCOUNTER — Inpatient Hospital Stay (HOSPITAL_COMMUNITY): Admission: RE | Admit: 2016-04-02 | Payer: Medicare Other | Source: Ambulatory Visit

## 2016-04-02 ENCOUNTER — Telehealth: Payer: Self-pay | Admitting: Emergency Medicine

## 2016-04-02 MED ORDER — AZITHROMYCIN 250 MG PO TABS: ORAL_TABLET | ORAL | 0 refills | Status: DC

## 2016-04-02 MED ORDER — PREDNISONE 10 MG PO TABS: ORAL_TABLET | ORAL | 0 refills | Status: DC

## 2016-04-02 NOTE — Telephone Encounter (Signed)
Spoke with the pt and notified of recs per CDY  She verbalized understanding and rxs were sent

## 2016-04-02 NOTE — Telephone Encounter (Signed)
Spoke with the pt  She is c/o cough with grey sputum, HA, chest soreness and increased SOB for the past 2 days  She also low grade temp 98.9, sore throat, chills, sweats  She is requesting pred and abx  Note [facility] for about an hour the day before her symptoms started (b/c she went to the wrong dept)  Allergies  Allergen Reactions  . Codeine Nausea Only   Current Outpatient Prescriptions on File Prior to Visit  Medication Sig Dispense Refill  . albuterol (PROVENTIL HFA;VENTOLIN HFA) 108 (90 Base) MCG/ACT inhaler Inhale 2 puffs into the lungs every 6 (six) hours as needed for wheezing or shortness of breath. 1 Inhaler 6  . baclofen (LIORESAL) 10 MG tablet take 1 tablet by mouth twice a day if needed  0  . budesonide-formoterol (SYMBICORT) 160-4.5 MCG/ACT inhaler Inhale 2 puffs into the lungs 2 (two) times daily. 1 Inhaler 6  . estradiol (ESTRACE) 1 MG tablet Take 1 mg by mouth daily.    Marland Kitchen. Fexofenadine HCl (MUCINEX ALLERGY PO) Take 1 tablet by mouth daily as needed (allergies/congestion.).     Marland Kitchen. fluticasone (FLONASE) 50 MCG/ACT nasal spray Place 2 sprays into both nostrils daily. 16 g 5  . gabapentin (NEURONTIN) 600 MG tablet Take 600 mg by mouth daily.     Marland Kitchen. HYDROcodone-acetaminophen (NORCO) 10-325 MG tablet Take 1 tablet by mouth every 6 (six) hours as needed for moderate pain.     . isosorbide mononitrate (IMDUR) 30 MG 24 hr tablet Take 30 mg by mouth daily.  1  . lamoTRIgine (LAMICTAL) 25 MG tablet Take 100 mg by mouth daily.     . pantoprazole (PROTONIX) 40 MG tablet Take 1 tablet (40 mg total) by mouth daily. 30 tablet 6  . Tiotropium Bromide Monohydrate (SPIRIVA RESPIMAT) 2.5 MCG/ACT AERS Inhale 2 puffs into the lungs daily. 1 Inhaler 6  . valsartan (DIOVAN) 320 MG tablet Take 1 tablet (320 mg total) by mouth daily. 30 tablet 2  . vortioxetine HBr (TRINTELLIX) 10 MG TABS Take 1 tablet by mouth daily.    Marland Kitchen. zolpidem (AMBIEN) 10 MG tablet Take 10 mg by mouth at bedtime  as needed for sleep.     No current facility-administered medications on file prior to visit.

## 2016-04-02 NOTE — Telephone Encounter (Signed)
Prednisone 10 mg, # 20, 4 X 2 DAYS, 3 X 2 DAYS, 2 X 2 DAYS, 1 X 2 DAYS  Zpak   250 mg, # 6, 2 today then one daily 

## 2016-04-07 ENCOUNTER — Inpatient Hospital Stay (HOSPITAL_COMMUNITY): Admission: RE | Admit: 2016-04-07 | Payer: Medicare Other | Source: Ambulatory Visit

## 2016-04-09 ENCOUNTER — Ambulatory Visit (HOSPITAL_COMMUNITY): Payer: Medicare Other

## 2016-04-14 ENCOUNTER — Ambulatory Visit (HOSPITAL_COMMUNITY): Payer: Medicare Other

## 2016-04-16 ENCOUNTER — Ambulatory Visit (HOSPITAL_COMMUNITY): Payer: Medicare Other

## 2016-04-21 ENCOUNTER — Ambulatory Visit (HOSPITAL_COMMUNITY): Payer: Medicare Other

## 2016-04-21 ENCOUNTER — Encounter (HOSPITAL_COMMUNITY)
Admission: RE | Admit: 2016-04-21 | Discharge: 2016-04-21 | Disposition: A | Discharge status: Home / Self Care | Payer: Medicare Other | Source: Ambulatory Visit | Attending: Emergency Medicine | Admitting: Emergency Medicine

## 2016-04-21 DIAGNOSIS — J438 Other emphysema: Secondary | ICD-10-CM

## 2016-04-22 ENCOUNTER — Ambulatory Visit: Payer: Medicare Other | Admitting: Psychology

## 2016-04-23 ENCOUNTER — Ambulatory Visit (HOSPITAL_COMMUNITY): Payer: Medicare Other

## 2016-04-23 ENCOUNTER — Encounter (HOSPITAL_COMMUNITY): Payer: Self-pay | Admitting: *Deleted

## 2016-04-24 ENCOUNTER — Encounter (HOSPITAL_COMMUNITY): Payer: Self-pay

## 2016-04-24 NOTE — Progress Notes (Signed)
Spoke with patient this am about concern with lack of nightly sleep, anxiety/tearfulness, and the verbalization of stress she is experiencing. It was discussed that she may not be appropriate to begin an exercise program until she has seen a primary care physician and establishes care with a psychiatrist as encouraged by multiple physicians according to past MD documentation. Patient presented to her 6 min walk test on 2/13 tachycardic because she is consuming large amounts of coffee during the day to counteract the symptoms of her lack of sleep. She was extremely "hyper" and her 6 min walk test could not be completed. She then cried in protest. Questioned if patient is in a manic phase of her bipolar disorder. Patient states she has not established an appointment with either MD as requested. Patient also states she is going out of town to visit her mother and will look into seeing her "old" psychiatrist if still practicing. Patient will be placed on medical hold from pulmonary rehab until seen by psychiatrist and/or primary care.

## 2016-04-28 ENCOUNTER — Ambulatory Visit (HOSPITAL_COMMUNITY): Payer: Medicare Other

## 2016-04-30 ENCOUNTER — Ambulatory Visit (HOSPITAL_COMMUNITY): Payer: Medicare Other

## 2016-05-05 ENCOUNTER — Ambulatory Visit (HOSPITAL_COMMUNITY): Payer: Medicare Other

## 2016-05-05 NOTE — Addendum Note (Signed)
Encounter addended by: Jacques EarthlyEdna Brewbaker Lemonte Al, RD on: 05/05/2016  3:23 PM<BR>    Actions taken: Visit Navigator Flowsheet section accepted

## 2016-05-07 ENCOUNTER — Ambulatory Visit (HOSPITAL_COMMUNITY): Payer: Medicare Other

## 2016-05-12 ENCOUNTER — Ambulatory Visit (HOSPITAL_COMMUNITY): Payer: Medicare Other

## 2016-05-14 ENCOUNTER — Ambulatory Visit (HOSPITAL_COMMUNITY): Payer: Medicare Other

## 2016-05-19 ENCOUNTER — Ambulatory Visit (HOSPITAL_COMMUNITY): Payer: Medicare Other

## 2016-05-21 ENCOUNTER — Ambulatory Visit (HOSPITAL_COMMUNITY): Payer: Medicare Other

## 2016-05-25 NOTE — Addendum Note (Signed)
Encounter addended by: Drema PryJoan A Jerrye Seebeck, RN on: 05/25/2016 10:22 AM<BR>    Actions taken: Sign clinical note, Episode resolved

## 2016-05-25 NOTE — Progress Notes (Signed)
Pulmonary Rehab Discharge Note  Laurie Schaefer has been discharged from pulmonary rehab due to uncontrolled bipolar disorder.  Laurie Schaefer has been instructed to follow up with her psychiatrist. Laurie Schaefer is not appropriate to participate at this time.

## 2016-05-26 ENCOUNTER — Ambulatory Visit (HOSPITAL_COMMUNITY): Payer: Medicare Other

## 2016-05-28 ENCOUNTER — Ambulatory Visit (HOSPITAL_COMMUNITY): Payer: Medicare Other

## 2016-06-02 ENCOUNTER — Ambulatory Visit (HOSPITAL_COMMUNITY): Payer: Medicare Other

## 2016-06-04 ENCOUNTER — Ambulatory Visit (HOSPITAL_COMMUNITY): Payer: Medicare Other

## 2016-06-04 ENCOUNTER — Telehealth: Payer: Self-pay | Admitting: General Practice

## 2016-06-09 ENCOUNTER — Ambulatory Visit (HOSPITAL_COMMUNITY): Payer: Medicare Other

## 2016-06-11 ENCOUNTER — Ambulatory Visit (HOSPITAL_COMMUNITY): Payer: Medicare Other

## 2016-06-16 ENCOUNTER — Ambulatory Visit (HOSPITAL_COMMUNITY): Payer: Medicare Other

## 2016-06-18 ENCOUNTER — Ambulatory Visit (HOSPITAL_COMMUNITY): Payer: Medicare Other

## 2016-06-23 ENCOUNTER — Ambulatory Visit (HOSPITAL_COMMUNITY): Payer: Medicare Other

## 2016-06-25 ENCOUNTER — Ambulatory Visit (HOSPITAL_COMMUNITY): Payer: Medicare Other

## 2016-06-30 ENCOUNTER — Ambulatory Visit (HOSPITAL_COMMUNITY): Payer: Medicare Other

## 2016-07-02 ENCOUNTER — Ambulatory Visit (HOSPITAL_COMMUNITY): Payer: Medicare Other

## 2016-07-07 ENCOUNTER — Ambulatory Visit (HOSPITAL_COMMUNITY): Payer: Medicare Other

## 2016-07-09 ENCOUNTER — Ambulatory Visit (HOSPITAL_COMMUNITY): Payer: Medicare Other

## 2016-07-14 ENCOUNTER — Ambulatory Visit (HOSPITAL_COMMUNITY): Payer: Medicare Other

## 2016-07-16 ENCOUNTER — Ambulatory Visit (HOSPITAL_COMMUNITY): Payer: Medicare Other

## 2016-07-21 ENCOUNTER — Ambulatory Visit (HOSPITAL_COMMUNITY): Payer: Medicare Other

## 2016-07-23 ENCOUNTER — Ambulatory Visit (HOSPITAL_COMMUNITY): Payer: Medicare Other

## 2016-07-28 ENCOUNTER — Ambulatory Visit (HOSPITAL_COMMUNITY): Payer: Medicare Other

## 2016-07-30 ENCOUNTER — Ambulatory Visit (HOSPITAL_COMMUNITY): Payer: Medicare Other

## 2016-08-04 ENCOUNTER — Ambulatory Visit (HOSPITAL_COMMUNITY): Payer: Medicare Other

## 2016-11-03 ENCOUNTER — Ambulatory Visit: Payer: Medicare Other | Admitting: Pulmonary Disease

## 2017-02-10 NOTE — Telephone Encounter (Signed)
error 

## 2017-03-18 ENCOUNTER — Telehealth: Payer: Self-pay | Admitting: General Practice

## 2017-03-18 ENCOUNTER — Ambulatory Visit: Payer: Self-pay | Admitting: *Deleted

## 2017-03-18 NOTE — Telephone Encounter (Signed)
Sent new pt pkg 03/18/17...ltd

## 2017-03-18 NOTE — Telephone Encounter (Signed)
See original note dated 1/101/9 at 1223;   Pt called complaining of congestion, headache, sore throat,  achy ears, and chest constriction that started about 5 days ago; pt states that she uses oxygen at 2 liters 11p-7a during sleep, but she will use it at other times as needed; per nurse triage recommendation given for pt to go to ED; she states that she wants to be seen as a new pt by [facility]ers[facility]Stephen [facility]; offered pt opportunity to see another provider who could possibly see her more quickly than Dr Clent RidgesFry but she is insistent on seeing him; [facility] and pt offered and accepted appointment on February 1 at 0800 with Dr Clent RidgesFry; pt also states that she will go [facility] Kennyth [facility]Stacy Wingate [facility] regarding her current issue.

## 2017-04-01 ENCOUNTER — Ambulatory Visit: Payer: Medicare HMO | Admitting: Adult Health

## 2017-04-01 ENCOUNTER — Encounter: Payer: Self-pay | Admitting: Adult Health

## 2017-04-01 ENCOUNTER — Ambulatory Visit (INDEPENDENT_AMBULATORY_CARE_PROVIDER_SITE_OTHER)
Admission: RE | Admit: 2017-04-01 | Discharge: 2017-04-01 | Disposition: A | Discharge status: Home / Self Care | Payer: Medicare HMO | Source: Ambulatory Visit | Attending: Adult Health | Admitting: Adult Health

## 2017-04-01 VITALS — BP 138/84 | HR 81 | Temp 98.6°F | Ht 65.0 in | Wt 194.0 lb

## 2017-04-01 DIAGNOSIS — J439 Emphysema, unspecified: Secondary | ICD-10-CM

## 2017-04-01 DIAGNOSIS — J9611 Chronic respiratory failure with hypoxia: Secondary | ICD-10-CM

## 2017-04-01 MED ORDER — BUDESONIDE-FORMOTEROL FUMARATE 160-4.5 MCG/ACT IN AERO: 2.0000 | INHALATION_SPRAY | Freq: Two times a day (BID) | RESPIRATORY_TRACT | 5 refills | Status: DC

## 2017-04-01 MED ORDER — AMOXICILLIN-POT CLAVULANATE 875-125 MG PO TABS: 1.0000 | ORAL_TABLET | Freq: Two times a day (BID) | ORAL | 0 refills | Status: AC

## 2017-04-01 MED ORDER — TIOTROPIUM BROMIDE MONOHYDRATE 2.5 MCG/ACT IN AERS: 2.0000 | INHALATION_SPRAY | Freq: Every day | RESPIRATORY_TRACT | 0 refills | Status: DC

## 2017-04-01 MED ORDER — BUDESONIDE-FORMOTEROL FUMARATE 160-4.5 MCG/ACT IN AERO: 2.0000 | INHALATION_SPRAY | Freq: Two times a day (BID) | RESPIRATORY_TRACT | 0 refills | Status: DC

## 2017-04-01 MED ORDER — TIOTROPIUM BROMIDE MONOHYDRATE 2.5 MCG/ACT IN AERS: 2.0000 | INHALATION_SPRAY | Freq: Every day | RESPIRATORY_TRACT | 5 refills | Status: DC

## 2017-04-01 MED ORDER — PREDNISONE 10 MG PO TABS
ORAL_TABLET | ORAL | 0 refills | Status: DC
Start: 2017-04-01 — End: 2017-05-07

## 2017-04-01 MED ORDER — BENZONATATE 200 MG PO CAPS: 200.0000 mg | ORAL_CAPSULE | Freq: Three times a day (TID) | ORAL | 1 refills | Status: DC | PRN

## 2017-04-01 NOTE — Addendum Note (Signed)
Addended by: Boone MasterJONES, Christene Pounds E on: 04/01/2017 01:01 PM   Modules accepted: Orders

## 2017-04-01 NOTE — Addendum Note (Signed)
Addended by: Boone MasterJONES, Harshaan Whang E on: 04/01/2017 01:12 PM   Modules accepted: Orders

## 2017-04-01 NOTE — Patient Instructions (Addendum)
Augmentin 875mg  Twice daily  For 1 week . Take with food/yogurt.  Prednisone taper over next week. Take with food.  Mucinex DM Twice daily  As needed  Cough/congestioin  Fluids and rest  Chest xray today .  Continue on Oxygen 2l/m At bedtime  And with activity .  Tessalon Three times a day  As needed  Cough  Zyrtec 10mg  At bedtime  As needed  Drainage .  Restart Symbicort and Spiriva .  Follow up with Dr. Delton CoombesByrum  In 2 months and As needed   Please contact office for sooner follow up if symptoms do not improve or worsen or seek emergency care

## 2017-04-01 NOTE — Progress Notes (Signed)
@Patient  ID: Laurie Schaefer, female    DOB: 04/11/1955, 62 y.o.   MRN: [id number]  Chief Complaint  Patient presents with  . Acute Visit    COPD     Referring provider: No ref. provider found  HPI: 62 year old female former smoker followed for COPD, vocal cord dysfunction ACE related cough  TEST Sleep study negative for obstructive sleep apnea   04/01/2017 Acute OV : COPD  Pt presents for an acute office visit. Complains of 3 weeks of cough , congestion , thick mucus /grey mucus , sore throat , drainage , and wheezing . Ran of of Symbicort and Spriva for couple of months .  Is on oxygen 2l/m with act and At bedtime  But does not have any portable tanks. Cough is keeping her up at night .  No chest pain, orthopnea, edema or fever.    Allergies  Allergen Reactions  . Codeine Nausea Only    Immunization History  Administered Date(s) Administered  . Influenza Split 12/07/2016  . Influenza,inj,Quad PF,6+ Mos 01/29/2016  . Pneumococcal Polysaccharide-23 01/29/2016    Past Medical History:  Diagnosis Date  . Arthritis   . Chronic neck pain   . Depression   . Fibromyalgia   . Headache   . Hypertension     Tobacco History: Social History   Tobacco Use  Smoking Status Former Smoker  . Packs/day: 0.80  . Years: 0.25  . Pack years: 0.20  . Types: Cigarettes  . Last attempt to quit: 03/09/1970  . Years since quitting: 47.0  Smokeless Tobacco Never Used  Tobacco Comment   1 cig every 2 weeks   Counseling given: Not Answered Comment: 1 cig every 2 weeks   Outpatient Encounter Medications as of 04/01/2017  Medication Sig  . albuterol (PROVENTIL HFA;VENTOLIN HFA) 108 (90 Base) MCG/ACT inhaler Inhale 2 puffs into the lungs every 6 (six) hours as needed for wheezing or shortness of breath.  Marland Kitchen alendronate (FOSAMAX) 70 MG tablet Take 70 mg by mouth once a week. Take with a full glass of water on an empty stomach.  . estradiol (ESTRACE) 1 MG tablet Take 1 mg by mouth  daily.  Marland Kitchen Fexofenadine HCl (MUCINEX ALLERGY PO) Take 1 tablet by mouth daily as needed (allergies/congestion.).   Marland Kitchen fluticasone (FLONASE) 50 MCG/ACT nasal spray Place 2 sprays into both nostrils daily.  Marland Kitchen gabapentin (NEURONTIN) 600 MG tablet Take 600 mg by mouth daily.   Marland Kitchen HYDROcodone-acetaminophen (NORCO) 10-325 MG tablet Take 1 tablet by mouth every 6 (six) hours as needed for moderate pain.   Marland Kitchen lamoTRIgine (LAMICTAL) 25 MG tablet Take 50 mg by mouth 2 (two) times daily.   Marland Kitchen losartan (COZAAR) 50 MG tablet Take 50 mg by mouth daily.  . pantoprazole (PROTONIX) 40 MG tablet Take 1 tablet (40 mg total) by mouth daily.  . traZODone (DESYREL) 100 MG tablet Take 100 mg by mouth at bedtime as needed for sleep.  Marland Kitchen venlafaxine XR (EFFEXOR-XR) 150 MG 24 hr capsule Take 150 mg by mouth daily with breakfast.  . zolpidem (AMBIEN) 10 MG tablet Take 10 mg by mouth at bedtime as needed for sleep.  Marland Kitchen amoxicillin-clavulanate (AUGMENTIN) 875-125 MG tablet Take 1 tablet by mouth 2 (two) times daily for 7 days.  . baclofen (LIORESAL) 10 MG tablet take 1 tablet by mouth twice a day if needed  . benzonatate (TESSALON) 200 MG capsule Take 1 capsule (200 mg total) by mouth 3 (three) times daily as needed for  cough.  . budesonide-formoterol (SYMBICORT) 160-4.5 MCG/ACT inhaler Inhale 2 puffs into the lungs 2 (two) times daily. (Patient not taking: Reported on 04/01/2017)  . isosorbide mononitrate (IMDUR) 30 MG 24 hr tablet Take 30 mg by mouth daily.  . predniSONE (DELTASONE) 10 MG tablet 4 tabs for 2 days, then 3 tabs for 2 days, 2 tabs for 2 days, then 1 tab for 2 days, then stop  . Tiotropium Bromide Monohydrate (SPIRIVA RESPIMAT) 2.5 MCG/ACT AERS Inhale 2 puffs into the lungs daily. (Patient not taking: Reported on 04/01/2017)  . [DISCONTINUED] azithromycin (ZITHROMAX Z-PAK) 250 MG tablet Take as directed (Patient not taking: Reported on 04/01/2017)  . [DISCONTINUED] predniSONE (DELTASONE) 10 MG tablet 4 x 2 days, 3 x 2  days, 2 x 2 days, 1 x 2 days then stop (Patient not taking: Reported on 04/01/2017)  . [DISCONTINUED] valsartan (DIOVAN) 320 MG tablet Take 1 tablet (320 mg total) by mouth daily. (Patient not taking: Reported on 04/01/2017)  . [DISCONTINUED] vortioxetine HBr (TRINTELLIX) 10 MG TABS Take 1 tablet by mouth daily.   No facility-administered encounter medications on file as of 04/01/2017.      Review of Systems  Constitutional:   No  weight loss, night sweats,  Fevers, chills, + fatigue, or  lassitude.  HEENT:   No headaches,  Difficulty swallowing,  Tooth/dental problems, or  Sore throat,                No sneezing, itching, ear ache,  +nasal congestion, post nasal drip,   CV:  No chest pain,  Orthopnea, PND, swelling in lower extremities, anasarca, dizziness, palpitations, syncope.   GI  No heartburn, indigestion, abdominal pain, nausea, vomiting, diarrhea, change in bowel habits, loss of appetite, bloody stools.   Resp:  .  No chest wall deformity  Skin: no rash or lesions.  GU: no dysuria, change in color of urine, no urgency or frequency.  No flank pain, no hematuria   MS:  No joint pain or swelling.  No decreased range of motion.  No back pain.    Physical Exam  BP 138/84 (BP Location: Left Arm, Cuff Size: Normal)   Pulse 81   Temp 98.6 F (37 C) (Oral)   Ht 5\' 5"  (1.651 m)   Wt 194 lb (88 kg)   SpO2 93%   BMI 32.28 kg/m   GEN: A/Ox3; pleasant , NAD    HEENT:  [place]/AT,  EACs-clear, TMs-wnl, NOSE-clear, THROAT-clear, no lesions, no postnasal drip or exudate noted.   NECK:  Supple w/ fair ROM; no JVD; normal carotid impulses w/o bruits; no thyromegaly or nodules palpated; no lymphadenopathy.    RESP  Few trace rhonchi , . no accessory muscle use, no dullness to percussion  CARD:  RRR, no m/r/g, no peripheral edema, pulses intact, no cyanosis or clubbing.  GI:   Soft & nt; nml bowel sounds; no organomegaly or masses detected.   Musco: Warm bil, no deformities or  joint swelling noted.   Neuro: alert, no focal deficits noted.    Skin: Warm, no lesions or rashes    Lab Results:  CBC   BMET  BNP No results found for: BNP  ProBNP No results found for: PROBNP  Imaging: No results found.   Assessment & Plan:   COPD (chronic obstructive pulmonary disease) (HCC) Exacerbation  Check cxr  Pt education on steroids , she has Bipolar but says she can tolerate predniosne without issues .   Plan  Patient Instructions  Augmentin 875mg  Twice daily  For 1 week . Take with food/yogurt.  Prednisone taper over next week. Take with food.  Mucinex DM Twice daily  As needed  Cough/congestioin  Fluids and rest  Chest xray today .  Continue on Oxygen 2l/m At bedtime  And with activity .  Tessalon Three times a day  As needed  Cough  Zyrtec 10mg  At bedtime  As needed  Drainage .  Restart Symbicort and Spiriva .  Follow up with Dr. Delton CoombesByrum  In 2 months and As needed   Please contact office for sooner follow up if symptoms do not improve or worsen or seek emergency care        Chronic respiratory failure with hypoxia (HCC) Cont on O2 with act and At bedtime   Portable order to DME      Laurie Oaksammy Kitt Minardi, NP 04/01/2017

## 2017-04-01 NOTE — Assessment & Plan Note (Signed)
Cont on O2 with act and At bedtime   Portable order to DME

## 2017-04-01 NOTE — Addendum Note (Signed)
Addended by: Marcellus ScottADKINS, Mehtab Dolberry L on: 04/01/2017 01:16 PM   Modules accepted: Orders

## 2017-04-01 NOTE — Assessment & Plan Note (Signed)
Exacerbation  Check cxr  Pt education on steroids , she has Bipolar but says she can tolerate predniosne without issues .   Plan  Patient Instructions  Augmentin 875mg  Twice daily  For 1 week . Take with food/yogurt.  Prednisone taper over next week. Take with food.  Mucinex DM Twice daily  As needed  Cough/congestioin  Fluids and rest  Chest xray today .  Continue on Oxygen 2l/m At bedtime  And with activity .  Tessalon Three times a day  As needed  Cough  Zyrtec 10mg  At bedtime  As needed  Drainage .  Restart Symbicort and Spiriva .  Follow up with Dr. Delton CoombesByrum  In 2 months and As needed   Please contact office for sooner follow up if symptoms do not improve or worsen or seek emergency care

## 2017-04-02 ENCOUNTER — Ambulatory Visit: Payer: Self-pay | Admitting: Emergency Medicine

## 2017-04-09 ENCOUNTER — Encounter: Payer: Self-pay | Admitting: Family Medicine

## 2017-04-09 ENCOUNTER — Ambulatory Visit (INDEPENDENT_AMBULATORY_CARE_PROVIDER_SITE_OTHER): Payer: Medicare HMO | Admitting: Family Medicine

## 2017-04-09 VITALS — BP 126/82 | Temp 86.0°F | Ht 63.5 in | Wt 192.6 lb

## 2017-04-09 DIAGNOSIS — G4700 Insomnia, unspecified: Secondary | ICD-10-CM

## 2017-04-09 DIAGNOSIS — M8949 Other hypertrophic osteoarthropathy, multiple sites: Secondary | ICD-10-CM

## 2017-04-09 DIAGNOSIS — M150 Primary generalized (osteo)arthritis: Secondary | ICD-10-CM

## 2017-04-09 DIAGNOSIS — J42 Unspecified chronic bronchitis: Secondary | ICD-10-CM

## 2017-04-09 DIAGNOSIS — Z8739 Personal history of other diseases of the musculoskeletal system and connective tissue: Secondary | ICD-10-CM

## 2017-04-09 DIAGNOSIS — I10 Essential (primary) hypertension: Secondary | ICD-10-CM | POA: Diagnosis not present

## 2017-04-09 DIAGNOSIS — Z0000 Encounter for general adult medical examination without abnormal findings: Secondary | ICD-10-CM

## 2017-04-09 DIAGNOSIS — F320 Major depressive disorder, single episode, mild: Secondary | ICD-10-CM

## 2017-04-09 DIAGNOSIS — M810 Age-related osteoporosis without current pathological fracture: Secondary | ICD-10-CM

## 2017-04-09 DIAGNOSIS — F319 Bipolar disorder, unspecified: Secondary | ICD-10-CM | POA: Diagnosis not present

## 2017-04-09 DIAGNOSIS — M159 Polyosteoarthritis, unspecified: Secondary | ICD-10-CM

## 2017-04-09 DIAGNOSIS — G43809 Other migraine, not intractable, without status migrainosus: Secondary | ICD-10-CM

## 2017-04-09 MED ORDER — IBUPROFEN 800 MG PO TABS: 800.0000 mg | ORAL_TABLET | Freq: Four times a day (QID) | ORAL | 3 refills | Status: DC | PRN

## 2017-04-09 MED ORDER — HYDROCODONE-ACETAMINOPHEN 10-325 MG PO TABS: 1.0000 | ORAL_TABLET | Freq: Four times a day (QID) | ORAL | 0 refills | Status: DC | PRN

## 2017-04-09 MED ORDER — MEDROXYPROGESTERONE ACETATE 2.5 MG PO TABS: 2.5000 mg | ORAL_TABLET | Freq: Every day | ORAL | 0 refills | Status: DC

## 2017-04-09 MED ORDER — QUETIAPINE FUMARATE 25 MG PO TABS: 25.0000 mg | ORAL_TABLET | Freq: Four times a day (QID) | ORAL | 0 refills | Status: DC | PRN

## 2017-04-09 MED ORDER — ALENDRONATE SODIUM 70 MG PO TABS: 70.0000 mg | ORAL_TABLET | ORAL | 3 refills | Status: DC

## 2017-04-09 MED ORDER — ESTRADIOL 1 MG PO TABS: 1.0000 mg | ORAL_TABLET | Freq: Every day | ORAL | 3 refills | Status: DC

## 2017-04-09 MED ORDER — LORAZEPAM 2 MG PO TABS: 2.0000 mg | ORAL_TABLET | Freq: Four times a day (QID) | ORAL | 3 refills | Status: DC | PRN

## 2017-04-09 MED ORDER — PANTOPRAZOLE SODIUM 40 MG PO TBEC: 40.0000 mg | DELAYED_RELEASE_TABLET | Freq: Every day | ORAL | 3 refills | Status: DC

## 2017-04-09 MED ORDER — ALBUTEROL SULFATE (2.5 MG/3ML) 0.083% IN NEBU: 2.5000 mg | INHALATION_SOLUTION | RESPIRATORY_TRACT | 12 refills | Status: DC | PRN

## 2017-04-09 MED ORDER — LAMOTRIGINE 25 MG PO TABS: 25.0000 mg | ORAL_TABLET | Freq: Two times a day (BID) | ORAL | 0 refills | Status: DC

## 2017-04-09 MED ORDER — MEDROXYPROGESTERONE ACETATE 2.5 MG PO TABS: 2.5000 mg | ORAL_TABLET | Freq: Every day | ORAL | 3 refills | Status: DC

## 2017-04-09 MED ORDER — ZOLPIDEM TARTRATE 10 MG PO TABS: 10.0000 mg | ORAL_TABLET | Freq: Every evening | ORAL | 1 refills | Status: DC | PRN

## 2017-04-09 MED ORDER — QUETIAPINE FUMARATE 25 MG PO TABS: 25.0000 mg | ORAL_TABLET | Freq: Four times a day (QID) | ORAL | 3 refills | Status: DC | PRN

## 2017-04-12 DIAGNOSIS — G43909 Migraine, unspecified, not intractable, without status migrainosus: Secondary | ICD-10-CM | POA: Insufficient documentation

## 2017-04-12 NOTE — Progress Notes (Signed)
   Subjective:    Patient ID: Laurie Schaefer, female    DOB: 1955-11-24, 62 y.o.   MRN: [id number]  HPI Here to re-establish with us. She has been seeing Scientific laboratory techniciantacy Wingate PA [facility] for primary care. She has been seeing [facility] Salli [facility]Yun Sun [facility] for chronic pain management. She has fibromyalgia and osteoarthritis, and this has been treated with Norco and gabapentin. She sees Dr. Levy Pupaobert Byrum for COPD. She has migraines which are fairly well controlled. She has osteoporosis but her last DEXA was over 5 years ago. Her last colonoscopy was over 10 years ago. She does have GYN exams and mammograms regularly.    Review of Systems  Constitutional: Negative.   Respiratory: Negative.   Cardiovascular: Negative.   Gastrointestinal: Negative.   Genitourinary: Negative.   Musculoskeletal: Positive for arthralgias and myalgias.  Neurological: Positive for headaches.  Psychiatric/Behavioral: Positive for sleep disturbance. Negative for dysphoric mood and hallucinations. The patient is nervous/anxious.        Objective:   Physical Exam  Constitutional: She is oriented to person, place, and time. She appears well-developed and well-nourished.  Neck: No thyromegaly present.  Cardiovascular: Normal rate, regular rhythm, normal heart sounds and intact distal pulses.  Pulmonary/Chest: Effort normal and breath sounds normal. No respiratory distress. She has no wheezes. She has no rales.  Lymphadenopathy:    She has no cervical adenopathy.  Neurological: She is alert and oriented to person, place, and time.  Psychiatric: She has a normal mood and affect. Her behavior is normal. Thought content normal.          Assessment & Plan:  Re-establish visit for her for primary care. We will get her medical records. Her migraines and fibromyalgia and anxiety and COPD seem to be stable. Some refills were written. We will set up a DEXA soon as well as a colonoscopy.  Gershon CraneStephen Tamaira Ciriello, MD

## 2017-04-14 ENCOUNTER — Inpatient Hospital Stay: Admission: RE | Admit: 2017-04-14 | Payer: Medicare HMO | Source: Ambulatory Visit

## 2017-04-15 ENCOUNTER — Telehealth: Payer: Self-pay | Admitting: Adult Health

## 2017-04-15 NOTE — Telephone Encounter (Signed)
I have the new form and have given it to Jess to get it filled out and signed by Marathon Oilammy Parrett

## 2017-04-15 NOTE — Telephone Encounter (Signed)
Called and spoke to Skye with [facility], wh[facility]if[place]o states that POC order received on 04/14/17 was not initialed and was incomplete Spoke with Synetta Failnita, who states she has not received new POC order as of yet.  Will route to [place] to f/u on.

## 2017-04-19 NOTE — Telephone Encounter (Signed)
Synetta Failnita please advise if you've received this form back from [employer] yet.  Thanks.

## 2017-04-21 ENCOUNTER — Telehealth: Payer: Self-pay | Admitting: Family Medicine

## 2017-04-21 NOTE — Telephone Encounter (Signed)
Copied from CRM [phone]. Topic: Referral - Status >> Apr 21, 2017  8:46 AM Oneal GroutSebastian, Jennifer S wrote: Reason for CRM: Ready to schedule done density, claustrophobic has concerns.

## 2017-04-23 NOTE — Telephone Encounter (Signed)
Form signed by TP 2.14.19 and given back to JeffersonAnita this morning to be faxed Will sign off

## 2017-04-26 ENCOUNTER — Other Ambulatory Visit: Payer: Medicare HMO

## 2017-04-26 ENCOUNTER — Ambulatory Visit (INDEPENDENT_AMBULATORY_CARE_PROVIDER_SITE_OTHER)
Admission: RE | Admit: 2017-04-26 | Discharge: 2017-04-26 | Disposition: A | Discharge status: Home / Self Care | Payer: Medicare HMO | Source: Ambulatory Visit

## 2017-04-26 DIAGNOSIS — M810 Age-related osteoporosis without current pathological fracture: Secondary | ICD-10-CM | POA: Diagnosis not present

## 2017-04-26 NOTE — Telephone Encounter (Signed)
Called pt to seek clarification on this note.

## 2017-04-27 NOTE — Telephone Encounter (Signed)
Patient already had dexa scan done, every thing worked out.

## 2017-04-27 NOTE — Telephone Encounter (Signed)
Called and spoke with pt. Pt stated that yes she had the BD scan done yesterday. Will f/u with pt this Friday on her results.

## 2017-04-30 ENCOUNTER — Encounter: Payer: Self-pay | Admitting: Family Medicine

## 2017-04-30 ENCOUNTER — Ambulatory Visit (INDEPENDENT_AMBULATORY_CARE_PROVIDER_SITE_OTHER): Payer: Medicare HMO | Admitting: Family Medicine

## 2017-04-30 VITALS — BP 138/74 | HR 79 | Temp 97.7°F | Wt 202.0 lb

## 2017-04-30 DIAGNOSIS — M797 Fibromyalgia: Secondary | ICD-10-CM | POA: Diagnosis not present

## 2017-04-30 DIAGNOSIS — J439 Emphysema, unspecified: Secondary | ICD-10-CM

## 2017-04-30 DIAGNOSIS — F1190 Opioid use, unspecified, uncomplicated: Secondary | ICD-10-CM | POA: Diagnosis not present

## 2017-04-30 NOTE — Progress Notes (Signed)
   Subjective:    Patient ID: Laurie Schaefer, female    DOB: [birth year], 62 y.o.   MRN: [id number]  HPI Here for pain management. She is doing fairly well.  Indication for chronic opioid: fibromyalgia Medication and dose: Norco 10-325 # pills per month: 120 Last UDS date: 04-30-17 Pain contract signed (Y/N): 04-30-17 Date narcotic database last reviewed (include red flags): 04-30-17    Review of Systems  Constitutional: Negative.   Respiratory: Negative.   Cardiovascular: Negative.   Musculoskeletal: Positive for myalgias.  Neurological: Negative.        Objective:   Physical Exam  Constitutional: She is oriented to person, place, and time. She appears well-developed and well-nourished.  Cardiovascular: Normal rate, regular rhythm, normal heart sounds and intact distal pulses.  Pulmonary/Chest: Effort normal and breath sounds normal. No respiratory distress. She has no wheezes. She has no rales.  Neurological: She is alert and oriented to person, place, and time.          Assessment & Plan:  Pain management, stable.  Gershon CraneStephen Ry Moody, MD

## 2017-05-03 DIAGNOSIS — M797 Fibromyalgia: Secondary | ICD-10-CM | POA: Insufficient documentation

## 2017-05-05 LAB — PAIN MGMT, PROFILE 8 W/CONF, U
6 Acetylmorphine: NEGATIVE ng/mL (ref <10)
Alcohol Metabolites: NEGATIVE ng/mL (ref <500)
Alphahydroxyalprazolam: NEGATIVE ng/mL (ref <25)
Alphahydroxymidazolam: NEGATIVE ng/mL (ref <50)
Alphahydroxytriazolam: NEGATIVE ng/mL (ref <50)
Aminoclonazepam: NEGATIVE ng/mL (ref <25)
Amphetamines: NEGATIVE ng/mL (ref <500)
Benzodiazepines: POSITIVE ng/mL — AB (ref <100)
Buprenorphine, Urine: NEGATIVE ng/mL (ref <5)
Cocaine Metabolite: NEGATIVE ng/mL (ref <150)
Codeine: NEGATIVE ng/mL (ref <50)
Creatinine: 164.1 mg/dL
Hydrocodone: 3015 ng/mL — ABNORMAL HIGH (ref <50)
Hydromorphone: 1199 ng/mL — ABNORMAL HIGH (ref <50)
Hydroxyethylflurazepam: NEGATIVE ng/mL (ref <50)
Lorazepam: >1000 ng/mL — ABNORMAL HIGH (ref <50)
MDMA: NEGATIVE ng/mL (ref <500)
Marijuana Metabolite: NEGATIVE ng/mL (ref <20)
Morphine: NEGATIVE ng/mL (ref <50)
Nordiazepam: NEGATIVE ng/mL (ref <50)
Norhydrocodone: 5279 ng/mL — ABNORMAL HIGH (ref <50)
Opiates: POSITIVE ng/mL — AB (ref <100)
Oxazepam: NEGATIVE ng/mL (ref <50)
Oxidant: NEGATIVE ug/mL (ref <200)
Oxycodone: NEGATIVE ng/mL (ref <100)
Temazepam: NEGATIVE ng/mL (ref <50)
pH: 7.08 (ref 4.5–9.0)

## 2017-05-07 ENCOUNTER — Encounter: Payer: Self-pay | Admitting: Adult Health

## 2017-05-07 ENCOUNTER — Ambulatory Visit: Payer: Medicare HMO | Admitting: Adult Health

## 2017-05-07 ENCOUNTER — Encounter: Payer: Self-pay | Admitting: Family Medicine

## 2017-05-07 ENCOUNTER — Telehealth: Payer: Self-pay | Admitting: Adult Health

## 2017-05-07 DIAGNOSIS — J9611 Chronic respiratory failure with hypoxia: Secondary | ICD-10-CM

## 2017-05-07 DIAGNOSIS — J101 Influenza due to other identified influenza virus with other respiratory manifestations: Secondary | ICD-10-CM

## 2017-05-07 DIAGNOSIS — J439 Emphysema, unspecified: Secondary | ICD-10-CM

## 2017-05-07 LAB — POCT INFLUENZA A/B
Influenza A, POC: POSITIVE — AB
Influenza B, POC: NEGATIVE

## 2017-05-07 MED ORDER — AZITHROMYCIN 250 MG PO TABS: ORAL_TABLET | ORAL | 0 refills | Status: AC

## 2017-05-07 MED ORDER — OSELTAMIVIR PHOSPHATE 75 MG PO CAPS: 75.0000 mg | ORAL_CAPSULE | Freq: Two times a day (BID) | ORAL | 0 refills | Status: AC

## 2017-05-07 MED ORDER — BENZONATATE 200 MG PO CAPS: 200.0000 mg | ORAL_CAPSULE | Freq: Three times a day (TID) | ORAL | 1 refills | Status: DC | PRN

## 2017-05-07 NOTE — Telephone Encounter (Signed)
Per TP: NO AMBIEN right now.  Okay for Tessalon 200mg  TID prn cough #30 with 1 refill.  Would also recommend Delsym otc 2 tsp every 12 hours as needed and the Mucinex DM twice daily as needed as recommended at today's visit.  If her symptoms do not improve or they worsen over the weekend, she needs to seek emergency attention.  Thanks.

## 2017-05-07 NOTE — Progress Notes (Signed)
@Patient  ID: Laurie Schaefer, female    DOB: 08/11/1955, 62 y.o.   MRN: [id number]  Chief Complaint  Patient presents with  . Acute Visit    Cough     Referring provider: Nelwyn Salisbury, MD  HPI: 62 year old female former smoker followed for COPD, vocal cord dysfunction ACE related cough  TEST Sleep study negative for obstructive sleep apnea  05/07/2017 Acute OV : COPD  Patient presents for an acute office visit.  Patient complains of sudden onset of cough with green mucus, wheezing, shortness of breath and chest tightness with fever at 101. Flu swab in the office today is positive .  Appetite is down but she is eating and drinking . No n/v/d .  She remains on oxygen 2 L with activity and at bedtime She is on Symbicort and Spiriva. Husband was sick first.   Patient was seen for 6 weeks ago for COPD exacerbation she was treated with antibiotics and steroids as she got better.  Chest x-ray April 01, 2017 showed no evidence of pneumonia.    Allergies  Allergen Reactions  . Codeine Nausea Only    Immunization History  Administered Date(s) Administered  . Influenza Split 12/07/2016  . Influenza,inj,Quad PF,6+ Mos 01/29/2016  . Pneumococcal Polysaccharide-23 01/29/2016    Past Medical History:  Diagnosis Date  . Arthritis   . Chronic neck pain    had previously seen Dr. Salli Real at [facility]   . COPD (chronic obstructive pulmonary disease) (HCC)    sees Dr. Delton Coombes   . Depression   . Fibromyalgia   . Fibromyalgia   . Headache   . Hypertension     Tobacco History: Social History   Tobacco Use  Smoking Status Former Smoker  . Packs/day: 0.80  . Years: 0.25  . Pack years: 0.20  . Types: Cigarettes  . Last attempt to quit: 03/09/1970  . Years since quitting: 47.1  Smokeless Tobacco Never Used  Tobacco Comment   1 cig every 2 weeks   Counseling given: Not Answered Comment: 1 cig every 2 weeks   Outpatient Encounter Medications as of  05/07/2017  Medication Sig  . albuterol (PROVENTIL) (2.5 MG/3ML) 0.083% nebulizer solution Take 3 mLs (2.5 mg total) by nebulization every 4 (four) hours as needed for wheezing or shortness of breath.  Marland Kitchen alendronate (FOSAMAX) 70 MG tablet Take 1 tablet (70 mg total) by mouth once a week. Take with a full glass of water on an empty stomach.  . baclofen (LIORESAL) 10 MG tablet take 1 tablet by mouth twice a day if needed  . benzonatate (TESSALON) 200 MG capsule Take 1 capsule (200 mg total) by mouth 3 (three) times daily as needed for cough.  . budesonide-formoterol (SYMBICORT) 160-4.5 MCG/ACT inhaler Inhale 2 puffs into the lungs 2 (two) times daily.  Marland Kitchen buPROPion (ZYBAN) 150 MG 12 hr tablet Take 150 mg by mouth 2 (two) times daily.  . cholecalciferol (VITAMIN D) 1000 units tablet Take 1,000 Units by mouth daily.  Marland Kitchen estradiol (ESTRACE) 1 MG tablet Take 1 tablet (1 mg total) by mouth daily.  Marland Kitchen Fexofenadine HCl (MUCINEX ALLERGY PO) Take 1 tablet by mouth daily as needed (allergies/congestion.).   Marland Kitchen fluticasone (FLONASE) 50 MCG/ACT nasal spray Place 2 sprays into both nostrils daily.  Marland Kitchen gabapentin (NEURONTIN) 600 MG tablet Take 600 mg by mouth daily.   Marland Kitchen HYDROcodone-acetaminophen (NORCO) 10-325 MG tablet Take 1 tablet by mouth every 6 (six) hours as needed for moderate  pain.  . ibuprofen (ADVIL,MOTRIN) 800 MG tablet Take 1 tablet (800 mg total) by mouth every 6 (six) hours as needed for moderate pain.  Marland Kitchen lamoTRIgine (LAMICTAL) 25 MG tablet Take 1 tablet (25 mg total) by mouth 2 (two) times daily.  Marland Kitchen LORazepam (ATIVAN) 2 MG tablet Take 1 tablet (2 mg total) by mouth every 6 (six) hours as needed for anxiety.  Marland Kitchen losartan (COZAAR) 50 MG tablet Take 50 mg by mouth daily.  . medroxyPROGESTERone (PROVERA) 2.5 MG tablet Take 1 tablet (2.5 mg total) by mouth daily.  . pantoprazole (PROTONIX) 40 MG tablet Take 1 tablet (40 mg total) by mouth daily.  . QUEtiapine (SEROQUEL) 25 MG tablet Take 1 tablet (25 mg  total) by mouth every 6 (six) hours as needed (anxiety).  . Tiotropium Bromide Monohydrate (SPIRIVA RESPIMAT) 2.5 MCG/ACT AERS Inhale 2 puffs into the lungs daily.  . traZODone (DESYREL) 100 MG tablet Take 100 mg by mouth at bedtime as needed for sleep.  Marland Kitchen zolpidem (AMBIEN) 10 MG tablet Take 1 tablet (10 mg total) by mouth at bedtime as needed for sleep.  Marland Kitchen azithromycin (ZITHROMAX Z-PAK) 250 MG tablet Take 2 tablets (500 mg) on  Day 1,  followed by 1 tablet (250 mg) once daily on Days 2 through 5.  . oseltamivir (TAMIFLU) 75 MG capsule Take 1 capsule (75 mg total) by mouth 2 (two) times daily for 5 days.  . [DISCONTINUED] predniSONE (DELTASONE) 10 MG tablet 4 tabs for 2 days, then 3 tabs for 2 days, 2 tabs for 2 days, then 1 tab for 2 days, then stop (Patient not taking: Reported on 05/07/2017)   No facility-administered encounter medications on file as of 05/07/2017.      Review of Systems  Constitutional:   No  weight loss, night sweats,   +Fevers, chills,  +fatigue, or  lassitude.  HEENT:   No headaches,  Difficulty swallowing,  Tooth/dental problems, or  Sore throat,                No sneezing, itching, ear ache,  +nasal congestion, post nasal drip,   CV:  No chest pain,  Orthopnea, PND, swelling in lower extremities, anasarca, dizziness, palpitations, syncope.   GI  No heartburn, indigestion, abdominal pain, nausea, vomiting, diarrhea, change in bowel habits, loss of appetite, bloody stools.   Resp:    No chest wall deformity  Skin: no rash or lesions.  GU: no dysuria, change in color of urine, no urgency or frequency.  No flank pain, no hematuria   MS:  No joint pain or swelling.  No decreased range of motion.  No back pain.    Physical Exam  BP 124/74 (BP Location: Left Arm, Cuff Size: Normal)   Temp 99.9 F (37.7 C) (Oral)   Ht 5\' 4"  (1.626 m)   Wt 197 lb 12.8 oz (89.7 kg)   SpO2 95%   BMI 33.95 kg/m   GEN: A/Ox3; pleasant , NAD, elderly on o2    HEENT:  [place]/AT,   EACs-clear, TMs-wnl, NOSE-clear, THROAT-clear, no lesions, no postnasal drip or exudate noted.   NECK:  Supple w/ fair ROM; no JVD; normal carotid impulses w/o bruits; no thyromegaly or nodules palpated; no lymphadenopathy.    RESP  Clear  P & A; w/o, wheezes/ rales/ or rhonchi. no accessory muscle use, no dullness to percussion  CARD:  RRR, no m/r/g, no peripheral edema, pulses intact, no cyanosis or clubbing.  GI:   Soft & nt;  nml bowel sounds; no organomegaly or masses detected.   Musco: Warm bil, no deformities or joint swelling noted.   Neuro: alert, no focal deficits noted.    Skin: Warm, no lesions or rashes    Lab Results:   BNP No results found for: BNP  ProBNP No results found for: PROBNP  Imaging: Dg Bone Density  Result Date: 05/02/2017 Date of study: 04/26/17 Exam: DUAL X-RAY ABSORPTIOMETRY (DXA) FOR BONE MINERAL DENSITY (BMD) Instrument: [employer] Requesting Provider: PCP Indication: follow up for low BMD Comparison: none (please note that it is not possible to compare data from different instruments) Clinical data: Pt is a 62 y.o. female with  previous history of fracture. On calcium and vitamin D. Also Fosamax and estrogen Results:  Lumbar spine L1-L4 Femoral neck (FN) 33% distal radius T-score -1.4 RFN: -1.4 LFN: -0.9 n/a Change in BMD from previous DXA test (%) n/a n/a n/a (*) statistically significant Assessment: the BMD is low according to the [facility] classification for osteoporosis (see below). Fracture risk: moderate FRAX score: not calculated due to treatment with Fosamax and estrogen Comments: the technical quality of the study is good. Evaluation for secondary causes should be considered if clinically indicated. Recommend optimizing calcium (1200 mg/day) and vitamin D (800 IU/day) intake. Followup: Repeat BMD is appropriate after 2 years or after 1-2 years if starting treatment. WHO criteria for diagnosis of osteoporosis in postmenopausal women and in men  82 y/o or older: - normal: T-score -1.0 to + 1.0 - osteopenia/low bone density: T-score between -2.5 and -1.0 - osteoporosis: T-score below -2.5 - severe osteoporosis: T-score below -2.5 with history of fragility fracture Note: although not part of the WHO classification, the presence of a fragility fracture, regardless of the T-score, should be considered diagnostic of osteoporosis, provided other causes for the fracture have been excluded. Treatment: The National Osteoporosis Foundation recommends that treatment be considered in postmenopausal women and men age 69 or older with: 1. Hip or vertebral (clinical or morphometric) fracture 2. T-score of - 2.5 or lower at the spine or hip 3. 10-year fracture probability by FRAX of at least 20% for a major osteoporotic fracture and 3% for a hip fracture Roxy Manns MD     Assessment & Plan:   COPD (chronic obstructive pulmonary disease) (HCC) Mild flare with Influenza  Hold on steroids   Plan  Patient Instructions  Begin Tamiflu 75 mg twice daily for 5 days Z-Pak take as directed Mucinex DM twice daily as needed for cough and congestion Fluids and rest. Tylenol as needed for fever Continue on Oxygen 2l/m At bedtime  And with activity .  Continue on Symbicort and Spiriva .  Follow up with Dr. Delton Coombes  In 6 weeks and As needed   Please contact office for sooner follow up if symptoms do not improve or worsen or seek emergency care        Chronic respiratory failure with hypoxia (HCC) Adequate on O2   Plan  Patient Instructions  Begin Tamiflu 75 mg twice daily for 5 days Z-Pak take as directed Mucinex DM twice daily as needed for cough and congestion Fluids and rest. Tylenol as needed for fever Continue on Oxygen 2l/m At bedtime  And with activity .  Continue on Symbicort and Spiriva .  Follow up with Dr. Delton Coombes  In 6 weeks and As needed   Please contact office for sooner follow up if symptoms do not improve or worsen or seek emergency care  Influenza A Acute Influenza - Begin Tamiflu x 5 days  Fluids and rest  Tylenol  Please contact office for sooner follow up if symptoms do not improve or worsen or seek emergency care       Rubye Oaksammy Parrett, NP 05/07/2017

## 2017-05-07 NOTE — Assessment & Plan Note (Signed)
Mild flare with Influenza  Hold on steroids   Plan  Patient Instructions  Begin Tamiflu 75 mg twice daily for 5 days Z-Pak take as directed Mucinex DM twice daily as needed for cough and congestion Fluids and rest. Tylenol as needed for fever Continue on Oxygen 2l/m At bedtime  And with activity .  Continue on Symbicort and Spiriva .  Follow up with Dr. Delton CoombesByrum  In 6 weeks and As needed   Please contact office for sooner follow up if symptoms do not improve or worsen or seek emergency care

## 2017-05-07 NOTE — Telephone Encounter (Signed)
Spoke with patient-aware of recs and Rx from TP. Rx sent to pharmacy. Pt will seek ER care if needed over the weekend. Nothing more needed at this time.

## 2017-05-07 NOTE — Assessment & Plan Note (Signed)
Adequate on O2   Plan  Patient Instructions  Begin Tamiflu 75 mg twice daily for 5 days Z-Pak take as directed Mucinex DM twice daily as needed for cough and congestion Fluids and rest. Tylenol as needed for fever Continue on Oxygen 2l/m At bedtime  And with activity .  Continue on Symbicort and Spiriva .  Follow up with Dr. Delton CoombesByrum  In 6 weeks and As needed   Please contact office for sooner follow up if symptoms do not improve or worsen or seek emergency care

## 2017-05-07 NOTE — Telephone Encounter (Signed)
Called pt who stated she is wanting to know if there is anything she could take to help ease up the cough to see if she can sleep better at night.  Pt stated she was afraid to take her ambien to help her sleep and wanted to know if it would be okay per TP.  Tammy Parrett, please advise on the above for pt. Thanks!

## 2017-05-07 NOTE — Assessment & Plan Note (Signed)
Acute Influenza - Begin Tamiflu x 5 days  Fluids and rest  Tylenol  Please contact office for sooner follow up if symptoms do not improve or worsen or seek emergency care

## 2017-05-07 NOTE — Addendum Note (Signed)
Addended by: Boone MasterJONES, Margaret Cockerill E on: 05/07/2017 11:03 AM   Modules accepted: Orders

## 2017-05-07 NOTE — Patient Instructions (Addendum)
Begin Tamiflu 75 mg twice daily for 5 days Z-Pak take as directed Mucinex DM twice daily as needed for cough and congestion Fluids and rest. Tylenol as needed for fever Continue on Oxygen 2l/m At bedtime  And with activity .  Continue on Symbicort and Spiriva .  Follow up with Dr. Delton CoombesByrum  In 6 weeks and As needed   Please contact office for sooner follow up if symptoms do not improve or worsen or seek emergency care

## 2017-06-01 ENCOUNTER — Telehealth: Payer: Self-pay | Admitting: Emergency Medicine

## 2017-06-01 MED ORDER — AMOXICILLIN-POT CLAVULANATE 875-125 MG PO TABS: 1.0000 | ORAL_TABLET | Freq: Two times a day (BID) | ORAL | 0 refills | Status: DC

## 2017-06-01 NOTE — Telephone Encounter (Signed)
I am glad to send her something but it sounds as if she may have PNA  This has been common after the flu .  OV is she can come in .  Please contact office for sooner follow up if symptoms do not improve or worsen or seek emergency care

## 2017-06-01 NOTE — Telephone Encounter (Signed)
Routing to TP for advisement

## 2017-06-01 NOTE — Telephone Encounter (Signed)
Called spoke with patient and offered appt with SG tomorrow 3.27.19 @ 1515.  Patient okay with appt but is asking if she can have something in the interim please?  Spoke with TP: okay for Augmentin 875 twice daily x7 days  Pt will keep the 3.27.19 appt with SG and pick up the abx in the meantime.  Pt will cancel above appt if no longer needed.  Pt is aware to contact the office if her symptoms do not improve or they worsen to seek emergency care.  Rx sent to verified pharmacy Nothing further needed at this time; will sign off.

## 2017-06-01 NOTE — Telephone Encounter (Signed)
Spoke with pt. she states she is still a lot of fatigue and she has not gotten better since she was diagnosed with he flu a long time ago. Her chest is really heavy and she is requesting prednisone and abx for her symptoms. She needs another round because she is not feeling good. She is coughing with some production and sometimes it is a dry hacking cough. She denies sore throat, fever, but does complain of body aches all over. She also states she is having upper mid back pain and throbbing in her chest. She has tried Pulte Homeseratol and Mucinex but no relief. TP please advise.   [facility]

## 2017-06-02 ENCOUNTER — Other Ambulatory Visit: Payer: Self-pay

## 2017-06-02 ENCOUNTER — Ambulatory Visit (INDEPENDENT_AMBULATORY_CARE_PROVIDER_SITE_OTHER)
Admission: RE | Admit: 2017-06-02 | Discharge: 2017-06-02 | Disposition: A | Discharge status: Home / Self Care | Payer: Medicare HMO | Source: Ambulatory Visit | Attending: Acute Care | Admitting: Acute Care

## 2017-06-02 ENCOUNTER — Ambulatory Visit: Payer: Medicare HMO | Admitting: Acute Care

## 2017-06-02 ENCOUNTER — Encounter: Payer: Self-pay | Admitting: Acute Care

## 2017-06-02 VITALS — BP 140/94 | HR 81 | Ht 64.0 in | Wt 200.4 lb

## 2017-06-02 DIAGNOSIS — R059 Cough, unspecified: Secondary | ICD-10-CM

## 2017-06-02 DIAGNOSIS — J181 Lobar pneumonia, unspecified organism: Secondary | ICD-10-CM | POA: Diagnosis not present

## 2017-06-02 DIAGNOSIS — R05 Cough: Secondary | ICD-10-CM | POA: Diagnosis not present

## 2017-06-02 DIAGNOSIS — J189 Pneumonia, unspecified organism: Secondary | ICD-10-CM

## 2017-06-02 DIAGNOSIS — J9611 Chronic respiratory failure with hypoxia: Secondary | ICD-10-CM

## 2017-06-02 MED ORDER — LEVALBUTEROL TARTRATE 45 MCG/ACT IN AERO: 2.0000 | INHALATION_SPRAY | RESPIRATORY_TRACT | 12 refills | Status: DC | PRN

## 2017-06-02 MED ORDER — FLUTICASONE-UMECLIDIN-VILANT 100-62.5-25 MCG/INH IN AEPB: 1.0000 | INHALATION_SPRAY | Freq: Every day | RESPIRATORY_TRACT | 0 refills | Status: DC

## 2017-06-02 MED ORDER — METHYLPREDNISOLONE ACETATE 80 MG/ML IJ SUSP
80.0000 mg | Freq: Once | INTRAMUSCULAR | Status: AC
  Administered 2017-06-02: 80 mg via INTRAMUSCULAR

## 2017-06-02 MED ORDER — LEVOFLOXACIN 750 MG PO TABS: 750.0000 mg | ORAL_TABLET | Freq: Every day | ORAL | 0 refills | Status: DC

## 2017-06-02 MED ORDER — PREDNISONE 10 MG PO TABS: ORAL_TABLET | ORAL | 0 refills | Status: DC

## 2017-06-02 NOTE — Assessment & Plan Note (Signed)
Mild Patchy Lingular Pneumonia Plan: We will send in a prescription for your Xopenex. Rescue inhaler  Levaquin 750 mg daily x 7 days DepoMedrol injection today  Start tomorrow>> Prednisone taper; 10 mg tablets: 4 tabs x 2 days, 3 tabs x 2 days, 2 tabs x 2 days 1 tab x 2 days then stop. We will try a therapeutic trial of Trelegy 1 puff once daily. Rinse mouth after use. Wear your oxygen as needed for shortness of breath or wheezing. Saturations goals are 88-92% Use your neb treatments as needed for shortness of breath. Follow up in 2 weeks to ensure you are better with Maralyn SagoSarah, NP with CXR prior Please contact office for sooner follow up if symptoms do not improve or worsen or seek emergency care

## 2017-06-02 NOTE — Progress Notes (Signed)
History of Present Illness Laurie Schaefer is a 62 y.o. female former smoker ( Quit 1092) with COPD,vocal cord dysfunction  And ACE related cough.She wears nocturnal oxygen ar 2 L [place]. She is followed by Dr.  Delton Coombes.  Maintenance medication : Symbicort and Spiriva  06/02/2017 Acute OV: Pt. Presents for acute visit for what she thinks is pneumonia. She was last seen in the office 05/07/2017 for COPD Flare and + Flu swab. She endorsed poor  Appetite at the time . She was treated with Tamiflu 75 mg twice daily for 5 days , Z-Pak take as directed, Mucinex DM twice daily as needed for cough and congestion. She presents today stating she has had back pain and she states that her chest is heavy.CXR is + for mild patchy lingular pneumonia.She is compliant with her nocturnal oxygen at 2 L [place].  Test Results: CXR 06/02/2017>>  Mild Patchy Lingular Pneumonia  04/01/2017>> CXR: IMPRESSION: There is no pneumonia nor other acute cardiopulmonary disease.  CBC Latest Ref Rng & Units 08/27/2015 08/15/2007  WBC 4.0 - 10.5 K/uL 7.5 7.3  Hemoglobin 11.7 - 15.5 g/dL 32.4 40.1  Hematocrit 02.7 - 45.0 % 40.7 39.3  Platelets 140 - 400 K/uL 334 291    BMP Latest Ref Rng & Units 08/27/2015 08/15/2007  Glucose 65 - 99 mg/dL 93 94  BUN 7 - 25 mg/dL 19 18  Creatinine 2.53 - 0.99 mg/dL 6.64 0.6  Sodium 403 - 146 mmol/L 139 138  Potassium 3.5 - 5.3 mmol/L 4.5 3.8  Chloride 98 - 110 mmol/L 104 101  CO2 20 - 31 mmol/L 25 27  Calcium 8.6 - 10.4 mg/dL 9.5 9.3    BNP No results found for: BNP  ProBNP No results found for: PROBNP  PFT    Component Value Date/Time   FEV1PRE 1.00 12/27/2015 1248   FEV1POST 0.97 12/27/2015 1248   FVCPRE 1.23 12/27/2015 1248   FVCPOST 1.09 12/27/2015 1248   DLCOUNC 14.59 12/27/2015 1248   PREFEV1FVCRT 81 12/27/2015 1248   PSTFEV1FVCRT 89 12/27/2015 1248    Dg Chest 2 View  Result Date: 06/02/2017 CLINICAL DATA:  Cough, congestion and shortness of breath over the last week.  EXAM: CHEST - 2 VIEW COMPARISON:  04/01/2017 FINDINGS: Heart size is normal. There is tortuosity of the aorta. The right lung is clear. There is patchy infiltrate in the lingula. No dense consolidation or collapse. No effusions. Bony structures unremarkable except for mild spinal curvature. IMPRESSION: Mild patchy lingular pneumonia. Electronically Signed   By: Paulina Fusi M.D.   On: 06/02/2017 14:22     Past medical hx Past Medical History:  Diagnosis Date  . Arthritis   . Chronic neck pain    had previously seen Dr. Salli Real at [facility]   . COPD (chronic obstructive pulmonary disease) (HCC)    sees Dr. Delton Coombes   . Depression   . Fibromyalgia   . Fibromyalgia   . Headache   . Hypertension      Social History   Tobacco Use  . Smoking status: Former Smoker    Packs/day: 0.80    Years: 0.25    Pack years: 0.20    Types: Cigarettes    Last attempt to quit: 03/09/1970    Years since quitting: 47.2  . Smokeless tobacco: Never Used  . Tobacco comment: 1 cig every 2 weeks  Substance Use Topics  . Alcohol use: No    Alcohol/week: 0.0 oz  . Drug use:  No    [name] reports that she quit smoking about 47 years ago. Her smoking use included cigarettes. She has a 0.20 pack-year smoking history. She has never used smokeless tobacco. She reports that she does not drink alcohol or use drugs.  Tobacco Cessation: Former smoker quit 1972 Past surgical hx, Family hx, Social hx all reviewed.  Current Outpatient Medications on File Prior to Visit  Medication Sig  . albuterol (PROVENTIL) (2.5 MG/3ML) 0.083% nebulizer solution Take 3 mLs (2.5 mg total) by nebulization every 4 (four) hours as needed for wheezing or shortness of breath.  Marland Kitchen alendronate (FOSAMAX) 70 MG tablet Take 1 tablet (70 mg total) by mouth once a week. Take with a full glass of water on an empty stomach.  Marland Kitchen amoxicillin-clavulanate (AUGMENTIN) 875-125 MG tablet Take 1 tablet by mouth 2 (two) times daily.  .  baclofen (LIORESAL) 10 MG tablet take 1 tablet by mouth twice a day if needed  . benzonatate (TESSALON) 200 MG capsule Take 1 capsule (200 mg total) by mouth 3 (three) times daily as needed for cough.  . budesonide-formoterol (SYMBICORT) 160-4.5 MCG/ACT inhaler Inhale 2 puffs into the lungs 2 (two) times daily.  Marland Kitchen buPROPion (ZYBAN) 150 MG 12 hr tablet Take 150 mg by mouth 2 (two) times daily.  . cholecalciferol (VITAMIN D) 1000 units tablet Take 1,000 Units by mouth daily.  Marland Kitchen estradiol (ESTRACE) 1 MG tablet Take 1 tablet (1 mg total) by mouth daily.  Marland Kitchen Fexofenadine HCl (MUCINEX ALLERGY PO) Take 1 tablet by mouth daily as needed (allergies/congestion.).   Marland Kitchen fluticasone (FLONASE) 50 MCG/ACT nasal spray Place 2 sprays into both nostrils daily.  Marland Kitchen gabapentin (NEURONTIN) 600 MG tablet Take 600 mg by mouth daily.   Marland Kitchen HYDROcodone-acetaminophen (NORCO) 10-325 MG tablet Take 1 tablet by mouth every 6 (six) hours as needed for moderate pain.  Marland Kitchen ibuprofen (ADVIL,MOTRIN) 800 MG tablet Take 1 tablet (800 mg total) by mouth every 6 (six) hours as needed for moderate pain.  Marland Kitchen lamoTRIgine (LAMICTAL) 25 MG tablet Take 1 tablet (25 mg total) by mouth 2 (two) times daily.  Marland Kitchen LORazepam (ATIVAN) 2 MG tablet Take 1 tablet (2 mg total) by mouth every 6 (six) hours as needed for anxiety.  Marland Kitchen losartan (COZAAR) 50 MG tablet Take 50 mg by mouth daily.  . medroxyPROGESTERone (PROVERA) 2.5 MG tablet Take 1 tablet (2.5 mg total) by mouth daily.  . pantoprazole (PROTONIX) 40 MG tablet Take 1 tablet (40 mg total) by mouth daily.  . QUEtiapine (SEROQUEL) 25 MG tablet Take 1 tablet (25 mg total) by mouth every 6 (six) hours as needed (anxiety).  . Tiotropium Bromide Monohydrate (SPIRIVA RESPIMAT) 2.5 MCG/ACT AERS Inhale 2 puffs into the lungs daily.  . traZODone (DESYREL) 100 MG tablet Take 100 mg by mouth at bedtime as needed for sleep.  Marland Kitchen zolpidem (AMBIEN) 10 MG tablet Take 1 tablet (10 mg total) by mouth at bedtime as needed for  sleep.   No current facility-administered medications on file prior to visit.      Allergies  Allergen Reactions  . Codeine Nausea Only    Review Of Systems:  Constitutional:   No  weight loss, night sweats,  +Fevers, +chills, +fatigue, or  lassitude.  HEENT:   No headaches,  Difficulty swallowing,  Tooth/dental problems, or  Sore throat,                No sneezing, itching, ear ache, nasal congestion, post nasal drip,   CV:  +  chest pain,  No Orthopnea, PND, swelling in lower extremities, anasarca, dizziness, palpitations, syncope.   GI  No heartburn, indigestion, abdominal pain, nausea, vomiting, diarrhea, change in bowel habits, loss of appetite, bloody stools.   Resp: + shortness of breath with exertion less at rest.  + excess mucus, + productive cough,  No non-productive cough,  No coughing up of blood.  + change in color of mucus.  + wheezing.  No chest wall deformity  Skin: no rash or lesions.  GU: no dysuria, change in color of urine, no urgency or frequency.  No flank pain, no hematuria   MS:  No joint pain or swelling.  No decreased range of motion.  No back pain.  Psych:  No change in mood or affect. No depression or anxiety.  No memory loss.   Vital Signs BP (!) 140/94 (BP Location: Left Arm, Cuff Size: Normal)   Pulse 81   Ht 5\' 4"  (1.626 m)   Wt 200 lb 6.4 oz (90.9 kg)   SpO2 98%   BMI 34.40 kg/m    Physical Exam:  General- No distress,  A&Ox3, pleasant ENT: No sinus tenderness, TM clear, pale nasal mucosa, no oral exudate,+ post nasal drip, no LAN Cardiac: S1, S2, regular rate and rhythm, no murmur Chest: + wheeze/ No rales/ dullness; no accessory muscle use, no nasal flaring, no sternal retractions Abd.: Soft Non-tender, ND, obese Ext: No clubbing cyanosis, edema Neuro:  normal strength Skin: No rashes, warm and dry Psych: normal mood and behavior   Assessment/Plan  Lingular pneumonia Mild Patchy Lingular Pneumonia Plan: We will send in a  prescription for your Xopenex. Rescue inhaler  Levaquin 750 mg daily x 7 days DepoMedrol injection today  Start tomorrow>> Prednisone taper; 10 mg tablets: 4 tabs x 2 days, 3 tabs x 2 days, 2 tabs x 2 days 1 tab x 2 days then stop. We will try a therapeutic trial of Trelegy 1 puff once daily. Rinse mouth after use. Wear your oxygen as needed for shortness of breath or wheezing. Saturations goals are 88-92% Use your neb treatments as needed for shortness of breath. Follow up in 2 weeks to ensure you are better with Maralyn SagoSarah, NP with CXR prior Please contact office for sooner follow up if symptoms do not improve or worsen or seek emergency care     Chronic respiratory failure with hypoxia (HCC) Increased use of oxygen from baseline Oxygen at bedtime ( 2 L ) and prn     Bevelyn NgoSarah F Jaydon Soroka, NP 06/02/2017  6:57 PM

## 2017-06-02 NOTE — Patient Instructions (Addendum)
It is nice to see you today. We will send in a prescription for your Xopenex. Rescue inhaler  Levaquin 750 mg daily x 7 days DepoMedrol injection today  Start tomorrow>> Prednisone taper; 10 mg tablets: 4 tabs x 2 days, 3 tabs x 2 days, 2 tabs x 2 days 1 tab x 2 days then stop. We will try a therapeutic trial of Trelegy 1 puff once daily. Rinse mouth after use. Wear your oxygen as needed for shortness of breath or wheezing. Saturations goals are 88-92% Use your neb treatments as needed for shortness of breath. Follow up in 2 weeks to ensure you are better with Maralyn SagoSarah, NP with CXR prior Please contact office for sooner follow up if symptoms do not improve or worsen or seek emergency care

## 2017-06-02 NOTE — Assessment & Plan Note (Signed)
Increased use of oxygen from baseline Oxygen at bedtime ( 2 L ) and prn

## 2017-06-07 ENCOUNTER — Ambulatory Visit: Payer: Medicare HMO | Admitting: Family Medicine

## 2017-06-17 ENCOUNTER — Ambulatory Visit: Payer: Medicare HMO | Admitting: Acute Care

## 2017-06-17 ENCOUNTER — Encounter: Payer: Self-pay | Admitting: Acute Care

## 2017-06-17 ENCOUNTER — Telehealth: Payer: Self-pay

## 2017-06-17 VITALS — BP 142/88 | HR 114 | Wt 199.8 lb

## 2017-06-17 DIAGNOSIS — J189 Pneumonia, unspecified organism: Secondary | ICD-10-CM | POA: Diagnosis not present

## 2017-06-17 DIAGNOSIS — R0602 Shortness of breath: Secondary | ICD-10-CM

## 2017-06-17 DIAGNOSIS — J439 Emphysema, unspecified: Secondary | ICD-10-CM | POA: Diagnosis not present

## 2017-06-17 LAB — NITRIC OXIDE: Nitric Oxide: 7

## 2017-06-17 MED ORDER — FLUTICASONE-UMECLIDIN-VILANT 100-62.5-25 MCG/INH IN AEPB: 1.0000 | INHALATION_SPRAY | Freq: Every day | RESPIRATORY_TRACT | 3 refills | Status: DC

## 2017-06-17 MED ORDER — LEVALBUTEROL HCL 0.63 MG/3ML IN NEBU: 0.6300 mg | INHALATION_SOLUTION | RESPIRATORY_TRACT | 5 refills | Status: DC | PRN

## 2017-06-17 MED ORDER — ALBUTEROL SULFATE (2.5 MG/3ML) 0.083% IN NEBU: 2.5000 mg | INHALATION_SOLUTION | RESPIRATORY_TRACT | 5 refills | Status: DC | PRN

## 2017-06-17 NOTE — Progress Notes (Signed)
History of Present Illness Laurie Schaefer is a 62 y.o. female with female former smoker ( Quit 1092) with COPD,vocal cord dysfunction  And ACE related cough.She wears nocturnal oxygen ar 2 L [place]. She is followed by Dr.  Delton Schaefer.  Maintenance medication : Symbicort and Spiriva >> 06/17/2017>> Trial of Trelegy  06/17/2017  Follow up pneumonia: Pt. Presents for follow up of pneumonia after treatment with Levaquin and pred taper. She states he has been doing better. She states he was compliant with her Levaquin and her pred taper. She states her cough is better . Her secretions are a clear gray.She states she has run out of her blood pressure medication and she has not had it filled, and that this is the reason her BP is elevated today..She continues to complain of allergies. We have told her to go to the basement for follow up CXR to ensure resolution of pneumonia..She denies fever, chest pain, orthopnea or hemoptysis.   Test Results: 06/17/2017>> FENO= 7ppb  06/17/2017>> CXR ordered  CXR 06/02/2017>>  Mild Patchy Lingular Pneumonia  04/01/2017>> CXR: IMPRESSION: There is no pneumonia nor other acute cardiopulmonary disease.   CBC Latest Ref Rng & Units 08/27/2015 08/15/2007  WBC 4.0 - 10.5 K/uL 7.5 7.3  Hemoglobin 11.7 - 15.5 g/dL 95.6 21.3  Hematocrit 08.6 - 45.0 % 40.7 39.3  Platelets 140 - 400 K/uL 334 291    BMP Latest Ref Rng & Units 08/27/2015 08/15/2007  Glucose 65 - 99 mg/dL 93 94  BUN 7 - 25 mg/dL 19 18  Creatinine 5.78 - 0.99 mg/dL 4.69 0.6  Sodium 629 - 146 mmol/L 139 138  Potassium 3.5 - 5.3 mmol/L 4.5 3.8  Chloride 98 - 110 mmol/L 104 101  CO2 20 - 31 mmol/L 25 27  Calcium 8.6 - 10.4 mg/dL 9.5 9.3    BNP No results found for: BNP  ProBNP No results found for: PROBNP  PFT    Component Value Date/Time   FEV1PRE 1.00 12/27/2015 1248   FEV1POST 0.97 12/27/2015 1248   FVCPRE 1.23 12/27/2015 1248   FVCPOST 1.09 12/27/2015 1248   DLCOUNC 14.59 12/27/2015 1248    PREFEV1FVCRT 81 12/27/2015 1248   PSTFEV1FVCRT 89 12/27/2015 1248    Dg Chest 2 View  Result Date: 06/02/2017 CLINICAL DATA:  Cough, congestion and shortness of breath over the last week. EXAM: CHEST - 2 VIEW COMPARISON:  04/01/2017 FINDINGS: Heart size is normal. There is tortuosity of the aorta. The right lung is clear. There is patchy infiltrate in the lingula. No dense consolidation or collapse. No effusions. Bony structures unremarkable except for mild spinal curvature. IMPRESSION: Mild patchy lingular pneumonia. Electronically Signed   By: Laurie Schaefer M.D.   On: 06/02/2017 14:22     Past medical hx Past Medical History:  Diagnosis Date  . Arthritis   . Chronic neck pain    had previously seen Dr. Salli Schaefer at [facility]   . COPD (chronic obstructive pulmonary disease) (HCC)    sees Dr. Delton Schaefer   . Depression   . Fibromyalgia   . Fibromyalgia   . Headache   . Hypertension      Social History   Tobacco Use  . Smoking status: Former Smoker    Packs/day: 0.80    Years: 0.25    Pack years: 0.20    Types: Cigarettes    Last attempt to quit: 03/09/1970    Years since quitting: 47.3  . Smokeless tobacco: Never Used  .  Tobacco comment: 1 cig every 2 weeks  Substance Use Topics  . Alcohol use: No    Alcohol/week: 0.0 oz  . Drug use: No    Laurie Schaefer reports that she quit smoking about 47 years ago. Her smoking use included cigarettes. She has a 0.20 pack-year smoking history. She has never used smokeless tobacco. She reports that she does not drink alcohol or use drugs.  Tobacco Cessation: Former smoker quit 1972 Past surgical hx, Family hx, Social hx all reviewed.  Current Outpatient Medications on File Prior to Visit  Medication Sig  . alendronate (FOSAMAX) 70 MG tablet Take 1 tablet (70 mg total) by mouth once a week. Take with a full glass of water on an empty stomach.  Marland Kitchen amoxicillin-clavulanate (AUGMENTIN) 875-125 MG tablet Take 1 tablet by mouth 2 (two)  times daily.  . baclofen (LIORESAL) 10 MG tablet take 1 tablet by mouth twice a day if needed  . benzonatate (TESSALON) 200 MG capsule Take 1 capsule (200 mg total) by mouth 3 (three) times daily as needed for cough.  . budesonide-formoterol (SYMBICORT) 160-4.5 MCG/ACT inhaler Inhale 2 puffs into the lungs 2 (two) times daily.  Marland Kitchen buPROPion (ZYBAN) 150 MG 12 hr tablet Take 150 mg by mouth 2 (two) times daily.  . cholecalciferol (VITAMIN D) 1000 units tablet Take 1,000 Units by mouth daily.  Marland Kitchen estradiol (ESTRACE) 1 MG tablet Take 1 tablet (1 mg total) by mouth daily.  Marland Kitchen Fexofenadine HCl (MUCINEX ALLERGY PO) Take 1 tablet by mouth daily as needed (allergies/congestion.).   Marland Kitchen fluticasone (FLONASE) 50 MCG/ACT nasal spray Place 2 sprays into both nostrils daily.  . Fluticasone-Umeclidin-Vilant (TRELEGY ELLIPTA) 100-62.5-25 MCG/INH AEPB Inhale 1 puff into the lungs daily.  Marland Kitchen gabapentin (NEURONTIN) 600 MG tablet Take 600 mg by mouth daily.   Marland Kitchen HYDROcodone-acetaminophen (NORCO) 10-325 MG tablet Take 1 tablet by mouth every 6 (six) hours as needed for moderate pain.  Marland Kitchen ibuprofen (ADVIL,MOTRIN) 800 MG tablet Take 1 tablet (800 mg total) by mouth every 6 (six) hours as needed for moderate pain.  . isosorbide mononitrate (IMDUR) 30 MG 24 hr tablet Take 30 mg by mouth daily.  Marland Kitchen lamoTRIgine (LAMICTAL) 25 MG tablet Take 1 tablet (25 mg total) by mouth 2 (two) times daily.  Marland Kitchen levalbuterol (XOPENEX HFA) 45 MCG/ACT inhaler Inhale 2 puffs into the lungs every 4 (four) hours as needed for wheezing.  Marland Kitchen levofloxacin (LEVAQUIN) 750 MG tablet Take 1 tablet (750 mg total) by mouth daily.  Marland Kitchen LORazepam (ATIVAN) 2 MG tablet Take 1 tablet (2 mg total) by mouth every 6 (six) hours as needed for anxiety.  . medroxyPROGESTERone (PROVERA) 2.5 MG tablet Take 1 tablet (2.5 mg total) by mouth daily.  . pantoprazole (PROTONIX) 40 MG tablet Take 1 tablet (40 mg total) by mouth daily.  . predniSONE (DELTASONE) 10 MG tablet Take 4 tabs  for 2 days, then 3 tabs for 2 days, 2 tabs for 2 days, then 1 tab for 2 days, then stop.  . QUEtiapine (SEROQUEL) 25 MG tablet Take 1 tablet (25 mg total) by mouth every 6 (six) hours as needed (anxiety).  . Tiotropium Bromide Monohydrate (SPIRIVA RESPIMAT) 2.5 MCG/ACT AERS Inhale 2 puffs into the lungs daily.  . traZODone (DESYREL) 100 MG tablet Take 100 mg by mouth at bedtime as needed for sleep.  Marland Kitchen zolpidem (AMBIEN) 10 MG tablet Take 1 tablet (10 mg total) by mouth at bedtime as needed for sleep.  Marland Kitchen losartan (COZAAR) 50 MG tablet  Take 50 mg by mouth daily.   No current facility-administered medications on file prior to visit.      Allergies  Allergen Reactions  . Codeine Nausea Only    Review Of Systems:  Constitutional:   No  weight loss, night sweats,  Fevers, chills, + fatigue, or  lassitude.  HEENT:   No headaches,  Difficulty swallowing,  Tooth/dental problems, or  Sore throat,                No sneezing, itching, ear ache, nasal congestion, post nasal drip,   CV:  No chest pain,  Orthopnea, PND, swelling in lower extremities, anasarca, dizziness, palpitations, syncope.   GI  No heartburn, indigestion, abdominal pain, nausea, vomiting, diarrhea, change in bowel habits, loss of appetite, bloody stools.   Resp: + Baseline  shortness of breath with exertion or at rest.  + excess mucus, no productive cough,  No non-productive cough,  No coughing up of blood.  + change in color of mucus.  + wheezing.  No chest wall deformity  Skin: no rash or lesions.  GU: no dysuria, change in color of urine, no urgency or frequency.  No flank pain, no hematuria   MS:  No joint pain or swelling.  No decreased range of motion.  No back pain.  Psych:  No change in mood or affect. No depression or anxiety.  No memory loss.   Vital Signs BP (!) 142/88   Pulse (!) 114   Wt 199 lb 12.8 oz (90.6 kg)   SpO2 96%   BMI 34.30 kg/m    Physical Exam:  General- No distress at all,  A&Ox3,  pleasant ENT: No sinus tenderness, TM clear, pale nasal mucosa, no oral exudate,no post nasal drip, no LAN Cardiac: S1, S2, regular rate and rhythm, no murmur Chest: No wheeze/ rales/ dullness; no accessory muscle use, no nasal flaring, no sternal retractions Abd.: Soft Non-tender, ND, BS+, Obese Ext: No clubbing cyanosis, edema Neuro:  MAE x 4, A&O x 3, Deconditioned at baseline Skin: No rashes, warm and dry Psych: normal mood and behavior   Assessment/Plan  Lingular pneumonia Clinical improvement Plan: FENO today>> 7 ppb We will do a follow up CXR. We will call you with results We will order your albuterol. We will try to get Xopenex nebs for you We will send in a prescription for Trelegy. 1 puff once daily Rinse Mouth after use. Continue wearing your oxygen at 2 L [place]. Remember your saturation goal is 88-92%Add  Zyrtec 10 mg once daily We will schedule you for PFT's . We will schedule a 6 minute walk Note your daily symptoms > remember "red flags" for COPD:  Increase in cough, increase in sputum production, increase in shortness of breath or increased activity intolerance. If you notice these symptoms, please call to be seen.   Follow up with Dr. Delton Schaefer in 3 months . Please contact office for sooner follow up if symptoms do not improve or worsen or seek emergency care     COPD (chronic obstructive pulmonary disease) (HCC) Mild flare resolving Plan FENO today>> 7 ppb We will do a follow up CXR. We will call you with results We will order your albuterol. We will try to get Xopenex nebs for you We will send in a prescription for Trelegy. 1 puff once daily Rinse Mouth after use. Continue wearing your oxygen at 2 L [place]. Remember your saturation goal is 88-92%Add  Zyrtec 10 mg once daily We will  schedule you for PFT's . We will schedule a 6 minute walk Note your daily symptoms > remember "red flags" for COPD:  Increase in cough, increase in sputum production, increase in  shortness of breath or increased activity intolerance. If you notice these symptoms, please call to be seen.   Follow up with Dr. Delton CoombesByrum in 3 months . Please contact office for sooner follow up if symptoms do not improve or worsen or seek emergency care        Bevelyn NgoSarah F Groce, NP 06/17/2017  4:47 PM

## 2017-06-17 NOTE — Patient Instructions (Addendum)
It is good to see you today. FENO today>> 7 ppb We will do a follow up CXR. We will call you with results We will order your albuterol. We will try to get Xopenex nebs for you We will send in a prescription for Trelegy. 1 puff once daily Rinse Mouth after use. Continue wearing your oxygen at 2 L . Remember your saturation goal is 88-92%Add  Zyrtec 10 mg once daily We will schedule you for PFT's . We will schedule a 6 minute walk Note your daily symptoms > remember "red flags" for COPD:  Increase in cough, increase in sputum production, increase in shortness of breath or increased activity intolerance. If you notice these symptoms, please call to be seen.   Follow up with Laurie Schaefer in 3 months . Please contact office for sooner follow up if symptoms do not improve or worsen or seek emergency care

## 2017-06-17 NOTE — Assessment & Plan Note (Signed)
Clinical improvement Plan: FENO today>> 7 ppb We will do a follow up CXR. We will call you with results We will order your albuterol. We will try to get Xopenex nebs for you We will send in a prescription for Trelegy. 1 puff once daily Rinse Mouth after use. Continue wearing your oxygen at 2 L . Remember your saturation goal is 88-92%Add  Zyrtec 10 mg once daily We will schedule you for PFT's . We will schedule a 6 minute walk Note your daily symptoms > remember "red flags" for COPD:  Increase in cough, increase in sputum production, increase in shortness of breath or increased activity intolerance. If you notice these symptoms, please call to be seen.   Follow up with Dr. Delton CoombesByrum in 3 months . Please contact office for sooner follow up if symptoms do not improve or worsen or seek emergency care

## 2017-06-17 NOTE — Telephone Encounter (Signed)
ATC pt, no answer. Left message for pt to call back.  She was supposed to get an xray done before she left today and there was not a report resulted. Advised her to call back to see if she could come in tomorrow to get this done.

## 2017-06-17 NOTE — Assessment & Plan Note (Signed)
Mild flare resolving Plan FENO today>> 7 ppb We will do a follow up CXR. We will call you with results We will order your albuterol. We will try to get Xopenex nebs for you We will send in a prescription for Trelegy. 1 puff once daily Rinse Mouth after use. Continue wearing your oxygen at 2 L [place]. Remember your saturation goal is 88-92%Add  Zyrtec 10 mg once daily We will schedule you for PFT's . We will schedule a 6 minute walk Note your daily symptoms > remember "red flags" for COPD:  Increase in cough, increase in sputum production, increase in shortness of breath or increased activity intolerance. If you notice these symptoms, please call to be seen.   Follow up with Dr. Delton CoombesByrum in 3 months . Please contact office for sooner follow up if symptoms do not improve or worsen or seek emergency care

## 2017-06-18 ENCOUNTER — Ambulatory Visit (INDEPENDENT_AMBULATORY_CARE_PROVIDER_SITE_OTHER)
Admission: RE | Admit: 2017-06-18 | Discharge: 2017-06-18 | Disposition: A | Discharge status: Home / Self Care | Payer: Medicare HMO | Source: Ambulatory Visit | Attending: Acute Care | Admitting: Acute Care

## 2017-06-18 DIAGNOSIS — R0602 Shortness of breath: Secondary | ICD-10-CM

## 2017-06-18 NOTE — Telephone Encounter (Signed)
Pt is returning call. Per Pt she is able to come in today to get her xray. Pt request call back to confirm she is able to do this. CB is [phone].

## 2017-06-18 NOTE — Telephone Encounter (Signed)
Spoke with pt and advised she could come today before 5 pm. Pt understood and nothing further is needed.

## 2017-06-21 ENCOUNTER — Telehealth: Payer: Self-pay | Admitting: Acute Care

## 2017-06-21 DIAGNOSIS — J181 Lobar pneumonia, unspecified organism: Secondary | ICD-10-CM

## 2017-06-21 NOTE — Telephone Encounter (Signed)
Called and spoke to patient, relayed results and let her know she will need to come back for chest x ray in two weeks. Order placed for chest xray. Nothing further needed at this time.

## 2017-06-21 NOTE — Telephone Encounter (Signed)
Patient states may leave message on VM.

## 2017-06-29 ENCOUNTER — Ambulatory Visit (INDEPENDENT_AMBULATORY_CARE_PROVIDER_SITE_OTHER): Payer: Medicare HMO | Admitting: Family Medicine

## 2017-06-29 ENCOUNTER — Encounter: Payer: Self-pay | Admitting: Family Medicine

## 2017-06-29 VITALS — BP 142/90 | HR 129 | Temp 98.1°F | Ht 64.0 in | Wt 201.4 lb

## 2017-06-29 DIAGNOSIS — I10 Essential (primary) hypertension: Secondary | ICD-10-CM

## 2017-06-29 DIAGNOSIS — M797 Fibromyalgia: Secondary | ICD-10-CM

## 2017-06-29 DIAGNOSIS — F319 Bipolar disorder, unspecified: Secondary | ICD-10-CM

## 2017-06-29 LAB — HEPATIC FUNCTION PANEL
ALT: 15 U/L (ref 0–35)
AST: 16 U/L (ref 0–37)
Albumin: 4.1 g/dL (ref 3.5–5.2)
Alkaline Phosphatase: 77 U/L (ref 39–117)
Bilirubin, Direct: 0.1 mg/dL (ref 0.0–0.3)
Total Bilirubin: 0.5 mg/dL (ref 0.2–1.2)
Total Protein: 6.7 g/dL (ref 6.0–8.3)

## 2017-06-29 LAB — BASIC METABOLIC PANEL
BUN: 25 mg/dL — ABNORMAL HIGH (ref 6–23)
CO2: 31 mEq/L (ref 19–32)
Calcium: 9.7 mg/dL (ref 8.4–10.5)
Chloride: 102 mEq/L (ref 96–112)
Creatinine, Ser: 0.91 mg/dL (ref 0.40–1.20)
GFR: 66.3 mL/min (ref >60.00)
Glucose, Bld: 89 mg/dL (ref 70–99)
Potassium: 3.8 mEq/L (ref 3.5–5.1)
Sodium: 139 mEq/L (ref 135–145)

## 2017-06-29 LAB — CBC WITH DIFFERENTIAL/PLATELET
Basophils Absolute: 0.1 10*3/uL (ref 0.0–0.1)
Basophils Relative: 0.5 % (ref 0.0–3.0)
Eosinophils Absolute: 0.4 10*3/uL (ref 0.0–0.7)
Eosinophils Relative: 3.3 % (ref 0.0–5.0)
HCT: 41 % (ref 36.0–46.0)
Hemoglobin: 13.8 g/dL (ref 12.0–15.0)
Lymphocytes Relative: 24.8 % (ref 12.0–46.0)
Lymphs Abs: 3 10*3/uL (ref 0.7–4.0)
MCHC: 33.7 g/dL (ref 30.0–36.0)
MCV: 88 fl (ref 78.0–100.0)
Monocytes Absolute: 1 10*3/uL (ref 0.1–1.0)
Monocytes Relative: 8.5 % (ref 3.0–12.0)
Neutro Abs: 7.6 10*3/uL (ref 1.4–7.7)
Neutrophils Relative %: 62.9 % (ref 43.0–77.0)
Platelets: 285 10*3/uL (ref 150.0–400.0)
RBC: 4.65 Mil/uL (ref 3.87–5.11)
RDW: 14.8 % (ref 11.5–15.5)
WBC: 12.1 10*3/uL — ABNORMAL HIGH (ref 4.0–10.5)

## 2017-06-29 LAB — TSH: TSH: 0.8 u[IU]/mL (ref 0.35–4.50)

## 2017-06-29 MED ORDER — VENLAFAXINE HCL ER 75 MG PO CP24: 75.0000 mg | ORAL_CAPSULE | Freq: Every day | ORAL | 0 refills | Status: DC

## 2017-06-29 MED ORDER — LORAZEPAM 2 MG PO TABS: 2.0000 mg | ORAL_TABLET | Freq: Three times a day (TID) | ORAL | 3 refills | Status: DC | PRN

## 2017-06-29 MED ORDER — BUPROPION HCL ER (XL) 150 MG PO TB24: 150.0000 mg | ORAL_TABLET | Freq: Every day | ORAL | 5 refills | Status: DC

## 2017-06-29 MED ORDER — LOSARTAN POTASSIUM 50 MG PO TABS: 50.0000 mg | ORAL_TABLET | Freq: Every day | ORAL | 3 refills | Status: DC

## 2017-06-29 NOTE — Progress Notes (Signed)
   Subjective:    Patient ID: Laurie Schaefer, female    DOB: 04-May-1955, 62 y.o.   MRN: [id number]  HPI Here to follow up on issues. She has had more anxiety lately and she wants to switch from Effexor back to Wellbutrin. She also wants to increase her Lorazepam to TID if possible. Also her BP has been up because she stopped taking Losartan due to concerns over the recent recalls. Also she has had muscle cramps for several weeks.    Review of Systems  Constitutional: Negative.   Respiratory: Positive for shortness of breath.   Cardiovascular: Negative.   Musculoskeletal: Positive for myalgias.  Neurological: Negative.   Psychiatric/Behavioral: Positive for sleep disturbance. Negative for dysphoric mood. The patient is nervous/anxious.        Objective:   Physical Exam  Constitutional: She is oriented to person, place, and time.  Wearing oxygen   Cardiovascular: Normal rate, regular rhythm, normal heart sounds and intact distal pulses.  Pulmonary/Chest: Effort normal and breath sounds normal. No respiratory distress. She has no wheezes. She has no rales.  Neurological: She is alert and oriented to person, place, and time.  Psychiatric: She has a normal mood and affect. Her behavior is normal. Thought content normal.          Assessment & Plan:  For the HTN, I asked her to check with her pharmacist about the Losartan. I assured her that they would not supply her with a batch of medication that was on the recall list. For the anxiety we will taper off Effexor by going down to the XR 75 mg a day dose for one month. We will start her on Wellbutrin XL 150 mg daily. Increase Lorazepam to TID as needed. Recheck this in one month. For the cramps she will try OTC magnesium pills. We will send her for labs today including a BMET. Gershon CraneStephen Elinora Weigand, MD

## 2017-07-05 ENCOUNTER — Telehealth: Payer: Self-pay | Admitting: Acute Care

## 2017-07-05 NOTE — Telephone Encounter (Signed)
Called and spoke with patient, advised her that order for xray was already placed and she could come in any time this week to have it done. Nothing further needed.

## 2017-07-07 ENCOUNTER — Ambulatory Visit (INDEPENDENT_AMBULATORY_CARE_PROVIDER_SITE_OTHER)
Admission: RE | Admit: 2017-07-07 | Discharge: 2017-07-07 | Disposition: A | Discharge status: Home / Self Care | Payer: Medicare HMO | Source: Ambulatory Visit | Attending: Acute Care | Admitting: Acute Care

## 2017-07-07 DIAGNOSIS — J181 Lobar pneumonia, unspecified organism: Secondary | ICD-10-CM

## 2017-07-08 ENCOUNTER — Telehealth: Payer: Self-pay | Admitting: Acute Care

## 2017-07-08 NOTE — Telephone Encounter (Signed)
Pt requesting cxr results from yesterday.  SG please advise.  Thanks!

## 2017-07-08 NOTE — Telephone Encounter (Signed)
Left message for patient to call back  

## 2017-07-08 NOTE — Telephone Encounter (Signed)
There is no active cardiopulmonary disease.  Please let patient know. Thanks

## 2017-07-09 NOTE — Telephone Encounter (Signed)
Spoke to patient. She is aware of results. Nothing else needed at time of call.

## 2017-07-09 NOTE — Telephone Encounter (Signed)
Patient is returning, CB is [phone]

## 2017-07-16 ENCOUNTER — Ambulatory Visit: Payer: Medicare HMO | Admitting: Internal Medicine

## 2017-07-16 ENCOUNTER — Encounter: Payer: Self-pay | Admitting: Internal Medicine

## 2017-07-16 ENCOUNTER — Other Ambulatory Visit (INDEPENDENT_AMBULATORY_CARE_PROVIDER_SITE_OTHER): Payer: Medicare HMO

## 2017-07-16 ENCOUNTER — Telehealth: Payer: Self-pay | Admitting: Emergency Medicine

## 2017-07-16 VITALS — BP 132/78 | HR 83 | Ht 65.0 in | Wt 204.0 lb

## 2017-07-16 DIAGNOSIS — R0609 Other forms of dyspnea: Secondary | ICD-10-CM

## 2017-07-16 DIAGNOSIS — J45991 Cough variant asthma: Secondary | ICD-10-CM

## 2017-07-16 DIAGNOSIS — J9611 Chronic respiratory failure with hypoxia: Secondary | ICD-10-CM | POA: Diagnosis not present

## 2017-07-16 DIAGNOSIS — R0600 Dyspnea, unspecified: Secondary | ICD-10-CM

## 2017-07-16 LAB — BASIC METABOLIC PANEL WITH GFR
BUN: 10 mg/dL (ref 6–23)
CO2: 27 meq/L (ref 19–32)
Calcium: 8.5 mg/dL (ref 8.4–10.5)
Chloride: 106 meq/L (ref 96–112)
Creatinine, Ser: 0.66 mg/dL (ref 0.40–1.20)
GFR: 96.03 mL/min
Glucose, Bld: 89 mg/dL (ref 70–99)
Potassium: 3.7 meq/L (ref 3.5–5.1)
Sodium: 140 meq/L (ref 135–145)

## 2017-07-16 LAB — CBC WITH DIFFERENTIAL/PLATELET
Basophils Absolute: 0 10*3/uL (ref 0.0–0.1)
Basophils Relative: 0.3 % (ref 0.0–3.0)
Eosinophils Absolute: 0.1 10*3/uL (ref 0.0–0.7)
Eosinophils Relative: 1.2 % (ref 0.0–5.0)
HCT: 36.5 % (ref 36.0–46.0)
Hemoglobin: 12.6 g/dL (ref 12.0–15.0)
Lymphocytes Relative: 27.7 % (ref 12.0–46.0)
Lymphs Abs: 2.5 10*3/uL (ref 0.7–4.0)
MCHC: 34.7 g/dL (ref 30.0–36.0)
MCV: 87 fl (ref 78.0–100.0)
Monocytes Absolute: 0.9 10*3/uL (ref 0.1–1.0)
Monocytes Relative: 9.5 % (ref 3.0–12.0)
Neutro Abs: 5.6 10*3/uL (ref 1.4–7.7)
Neutrophils Relative %: 61.3 % (ref 43.0–77.0)
Platelets: 328 10*3/uL (ref 150.0–400.0)
RBC: 4.19 Mil/uL (ref 3.87–5.11)
RDW: 14.4 % (ref 11.5–15.5)
WBC: 9.1 10*3/uL (ref 4.0–10.5)

## 2017-07-16 LAB — BRAIN NATRIURETIC PEPTIDE: Pro B Natriuretic peptide (BNP): 74 pg/mL (ref 0.0–100.0)

## 2017-07-16 LAB — TSH: TSH: 1.45 u[IU]/mL (ref 0.35–4.50)

## 2017-07-16 MED ORDER — BUDESONIDE-FORMOTEROL FUMARATE 80-4.5 MCG/ACT IN AERO: 2.0000 | INHALATION_SPRAY | Freq: Two times a day (BID) | RESPIRATORY_TRACT | 11 refills | Status: DC

## 2017-07-16 NOTE — Patient Instructions (Addendum)
Stop trelegy / fosfamax / spiriva   Plan A = Automatic = Symbicort 80 Take 2 puffs first thing in am and then another 2 puffs about 12 hours later.   Work on Chartered loss adjuster inhaler technique:  relax and gently blow all the way out then take a nice smooth deep breath back in, triggering the inhaler at same time you start breathing in.  Hold for up to 5 seconds if you can. Blow out thru nose. Rinse and gargle with water when done   Plan B = Backup Only use your albuterol as a rescue medication to be used if you can't catch your breath by resting or doing a relaxed purse lip breathing pattern.  - The less you use it, the better it will work when you need it. - Ok to use the inhaler up to 2 puffs  every 4 hours if you must but call for appointment if use goes up over your usual need - Don't leave home without it !!  (think of it like the spare tire for your car)   Plan C = Crisis - only use your levoalbuterol nebulizer if you first try Plan B and it fails to help > ok to use the nebulizer up to every 4 hours but if start needing it regularly call for immediate appointment   Pantoprazole (protonix) 40 mg   Take  30-60 min before first meal of the day and Pepcid (famotidine)  20 mg one @  bedtime until return to office - this is the best way to tell whether stomach acid is contributing to your problem.     GERD (REFLUX)  is an extremely common cause of respiratory symptoms just like yours , many times with no obvious heartburn at all.    It can be treated with medication, but also with lifestyle changes including elevation of the head of your bed (ideally with 6 inch  bed blocks),  Smoking cessation, avoidance of late meals, excessive alcohol, and avoid fatty foods, chocolate, peppermint, colas, red wine, and acidic juices such as orange juice.  NO MINT OR MENTHOL PRODUCTS SO NO COUGH DROPS  USE SUGARLESS CANDY INSTEAD (Jolley ranchers or Stover's or Life Savers) or even ice chips will also do  - the key is to swallow to prevent all throat clearing. NO OIL BASED VITAMINS - use powdered substitutes.  Also avoid foods you know cause gas especially [place] food and undercooked veggies/ salads/ boiled eggs/ beans    For drainage / throat tickle try take CHLORPHENIRAMINE  4 mg - take one every 4 hours as needed - available over the counter- may cause drowsiness so start with just a bedtime dose or two and see how you tolerate it before trying in daytime     Please remember to go to the lab department downstairs in the basement  for your tests - we will call you with the results when they are available.   See Tammy NP w/in 2 weeks with all your medications, even over the counter meds, separated in two separate bags, the ones you take no matter what vs the ones you stop once you feel better and take only as needed when you feel you need them.   Tammy  will generate for you a new user friendly medication calendar that will put [place] all on the same page re: your medication use.     Without this process, it simply isn't possible to assure that we are providing  your  outpatient care  with  the attention to detail we feel you deserve.   If we cannot assure that you're getting that kind of care,  then we cannot manage your problem effectively from this clinic.  Once you have seen Tammy and we are sure that we're all on the same page with your medication use she will arrange follow up with me.

## 2017-07-16 NOTE — Telephone Encounter (Signed)
Called and spoke with pt  Who states she has extreme fatigue, SOB, pain in lung area, which symptoms are worsening.  Pt also has complaints of pain in upper back, nailbeds are blue per pt, and increased mucus amount.  Pt stated her symptoms began 1 week ago and just began to get worse.  MW had an opening at 2:15 and pt stated she would take that appt.  Scheduled acute visit for pt today at 2:15

## 2017-07-16 NOTE — Progress Notes (Signed)
Subjective:     Patient ID: Laurie Schaefer, female   DOB: 11-Mar-1955,    MRN: [id number]  HPI  74 yowf never regular smoker relatively well until around 2014 with  onset of variable chest heavy/ short  Of breath fatigue freq with cough variably productive and in the past treated as "bronchitis"  Got some better with short term rx   but eventually around 2018 placed on inhalers as maint rx and persistently  downhill trend since fall 2018 only transiently better on symbicort/spiriva and much worse since  Jan 2019'  pfts 12/2015 not c/w dx of copd / never had CT chest in epic with ? Dx of emphysema clinically     NP eval 06/17/17 : rec FENO today:  7 ppb We will do a follow up CXR. We will call you with results We will order your albuterol. We will try to get Xopenex nebs for you We will send in a prescription for Trelegy. 1 puff once daily Rinse Mouth after use. Continue wearing your oxygen at 2 L [place]. Remember your saturation goal is 88-92%Add  Zyrtec 10 mg once daily We will schedule you for PFT's . We will schedule a 6 minute walk Note your daily symptoms > remember "red flags" for COPD:  Increase in cough, increase in sputum production, increase in shortness of breath or increased activity intolerance. If you notice these symptoms, please call to be seen.       07/16/2017 acute extended ov/Lannah Koike re: sob/ cough /cp  Chief Complaint  Patient presents with  . Acute Visit    Increased fatigue over the past 2-3 wks. She states that she gets SOB if she talks to much or gets in a hurry.  She has some right side rib pain and back pain. She is using her xopenex neb 2 x daily on average.   On 02  3-4 y due to "throat closing" per [facility] eval  R chest pain x several months not as bad supine worse with deep breath not exertional     Sleeps 30 degrees lots of pillows/ cough / sob worse flat  All of her symptoms worse gradually worse on trelegy/ fosfamax since last ov assoc with dry  coughing fits Sob at rest if talks too much or across the room even on 02 Neb 3 x daily help some   No obvious patterns to day to day or daytime variability or assoc excess/ purulent sputum or mucus plugs or hemoptysis or cp or chest tightness, subjective wheeze or overt sinus or hb symptoms. No unusual exposure hx or h/o childhood pna/ asthma or knowledge of premature birth.    Also denies any obvious fluctuation of symptoms with weather or environmental changes or other aggravating or alleviating factors except as outlined above   Current Allergies, Complete Past Medical History, Past Surgical History, Family History, and Social History were reviewed in [employer] record.  ROS  The following are not active complaints unless bolded Hoarseness, sore throat, dysphagia, dental problems, itching, sneezing,  nasal congestion or discharge of excess mucus or purulent secretions, ear ache,   fever, chills, sweats, unintended wt loss or wt gain, classically pleuritic or exertional cp,  orthopnea pnd or arm/hand swelling  or leg swelling, presyncope, palpitations, abdominal pain, anorexia, nausea, vomiting, diarrhea  or change in bowel habits or change in bladder habits, change in stools or change in urine, dysuria, hematuria,  rash, arthralgias, visual complaints, headache, numbness, weakness or ataxia or  problems with walking or coordination,  change in mood or  memory.        Current Meds - not able to verify this list as accurate   Medication Sig  . buPROPion (WELLBUTRIN XL) 150 MG 24 hr tablet Take 1 tablet (150 mg total) by mouth daily.  . cholecalciferol (VITAMIN D) 1000 units tablet Take 1,000 Units by mouth daily.  Marland Kitchen estradiol (ESTRACE) 1 MG tablet Take 1 tablet (1 mg total) by mouth daily.  Marland Kitchen Fexofenadine HCl (MUCINEX ALLERGY PO) Take 1 tablet by mouth daily as needed (allergies/congestion.).   Marland Kitchen HYDROcodone-acetaminophen (NORCO) 10-325 MG tablet Take 1 tablet by mouth  every 6 (six) hours as needed for moderate pain.  Marland Kitchen ibuprofen (ADVIL,MOTRIN) 800 MG tablet Take 1 tablet (800 mg total) by mouth every 6 (six) hours as needed for moderate pain.  Marland Kitchen lamoTRIgine (LAMICTAL) 25 MG tablet Take 1 tablet (25 mg total) by mouth 2 (two) times daily.  Marland Kitchen levalbuterol (XOPENEX) 0.63 MG/3ML nebulizer solution Take 3 mLs (0.63 mg total) by nebulization every 4 (four) hours as needed for wheezing or shortness of breath.  Marland Kitchen LORazepam (ATIVAN) 2 MG tablet Take 1 tablet (2 mg total) by mouth every 8 (eight) hours as needed for anxiety.  Marland Kitchen losartan (COZAAR) 50 MG tablet Take 1 tablet (50 mg total) by mouth daily.  . medroxyPROGESTERone (PROVERA) 2.5 MG tablet Take 1 tablet (2.5 mg total) by mouth daily.  . OXYGEN 3lpm with sleep and exertion if needed  . pantoprazole (PROTONIX) 40 MG tablet Take 1 tablet (40 mg total) by mouth daily.  . QUEtiapine (SEROQUEL) 25 MG tablet Take 1 tablet (25 mg total) by mouth every 6 (six) hours as needed (anxiety).  Marland Kitchen zolpidem (AMBIEN) 10 MG tablet Take 1 tablet (10 mg total) by mouth at bedtime as needed for sleep.  .  alendronate (FOSAMAX) 70 MG tablet Take 1 tablet (70 mg total) by mouth once a week. Take with a full glass of water on an empty stomach.  . [   benzonatate (TESSALON) 200 MG capsule Take 1 capsule (200 mg total) by mouth 3 (three) times daily as needed for cough.  .     . [  Fluticasone-Umeclidin-Vilant (TRELEGY ELLIPTA) 100-62.5-25 MCG/INH AEPB Inhale 1 puff into the lungs daily.             Review of Systems     Objective:   Physical Exam   Hoarse wf nad too light headed to get up on exam table/ prominent pseudowheeze only finding  Very difficult hx/ prefers use medical dxs to answer questions related to symptoms      Wt Readings from Last 3 Encounters:  07/16/17 204 lb (92.5 kg)  06/29/17 201 lb 6.4 oz (91.4 kg)  06/17/17 199 lb 12.8 oz (90.6 kg)     Vital signs reviewed - Note on arrival 02 sats  98% on  3lpm      HEENT: nl dentition, turbinates bilaterally, and oropharynx. Nl external ear canals without cough reflex   NECK :  without JVD/Nodes/TM/ nl carotid upstrokes bilaterally   LUNGS: no acc muscle use,  Nl contour chest which is clear to A and P bilaterally without cough on insp or exp maneuvers   CV:  RRR  no s3 or murmur or increase in P2, and no edema   ABD:  Quite obese but soft and nontender with very limited inspiratory excursion in the supine position. No bruits or organomegaly appreciated,  bowel sounds nl  MS:  Nl gait/ ext warm without deformities, calf tenderness, cyanosis or clubbing No obvious joint restrictions   SKIN: warm and dry without lesions    NEURO:  alert,   nl sensorium with  no motor or cerebellar deficits apparent.       I personally reviewed images and agree with radiology impression as follows:  CXR:   07/07/17  There is no active cardiopulmonary disease   Labs ordered/ reviewed:      Chemistry      Component Value Date/Time   NA 140 07/16/2017 1549   K 3.7 07/16/2017 1549   CL 106 07/16/2017 1549   CO2 27 07/16/2017 1549   BUN 10 07/16/2017 1549   CREATININE 0.66 07/16/2017 1549   CREATININE 0.74 08/27/2015 0917      Component Value Date/Time   CALCIUM 8.5 07/16/2017 1549   ALKPHOS 77 06/29/2017 1433   AST 16 06/29/2017 1433   ALT 15 06/29/2017 1433   BILITOT 0.5 06/29/2017 1433        Lab Results  Component Value Date   WBC 9.1 07/16/2017   HGB 12.6 07/16/2017   HCT 36.5 07/16/2017   MCV 87.0 07/16/2017   PLT 328.0 07/16/2017       EOS                                                               0.1                                    07/16/2017   Lab Results  Component Value Date   DDIMER 0.54 (H) 07/16/2017      Lab Results  Component Value Date   TSH 1.45 07/16/2017     Lab Results  Component Value Date   PROBNP 74.0 07/16/2017     Labs ordered 07/16/2017  Allergy profile      Assessment:

## 2017-07-17 NOTE — Assessment & Plan Note (Addendum)
PFT's  12/27/15   FEV1 0.97 (39 % ) ratio 89  p no % improvement from saba p ? prior to study with DLCO  61 % corrects to 92 % for alv volume  / min curvature/ ERV 20%  - Spirometry 07/16/2017  Not reproducible and f/v not physiologic p rx trelegy > d/c   Symptoms are markedly disproportionate to objective findings and not clear to what extent this is actually a pulmonary  problem but pt does appear to have difficult to sort out respiratory symptoms of unknown origin for which  DDX  = almost all start with A and  include Adherence, Ace Inhibitors, Acid Reflux, Active Sinus Disease, Alpha 1 Antitripsin deficiency, Anxiety masquerading as Airways dz,  ABPA,  Allergy(esp in young), Aspiration (esp in elderly), Adverse effects of meds,  Active smokers, A bunch of PE's/clot burden (a few small clots can't cause this syndrome unless there is already severe underlying pulm or vascular dz with poor reserve),  Anemia or thyroid disorder, plus two Bs  = Bronchiectasis and Beta blocker use..and one C= CHF     Adherence is always the initial "prime suspect" and is a multilayered concern that requires a "trust but verify" approach in every patient - starting with knowing how to use medications, especially inhalers, correctly, keeping up with refills and understanding the fundamental difference between maintenance and prns vs those medications only taken for a very short course and then stopped and not refilled.  - see hfa teaching sep a/p - return with all meds in hand using a trust but verify approach to confirm accurate Medication  Reconciliation The principal here is that until we are certain that the  patients are doing what we've asked, it makes no sense to ask them to do more.   ? Acid (or non-acid) GERD > always difficult to exclude as up to 75% of pts in some series report no assoc GI/ Heartburn symptoms and she is on chronic fosfamax > rec d/c this and  max (24h)  acid suppression and diet restrictions/  reviewed and instructions given in writing.   ? Anxiety/vcd  > usually at the bottom of this list of usual suspects but should be much higher on this pt's based on H and P and note already on psychotropics and may interfere with adherence and also interpretation of response or lack thereof to symptom management which can be quite subjective.   ? Adverse effects of dpi > d/c trelegy   ? Allergy/ asthma > send allergy profile >> see sep a/p but  This is clearly not severe asthma and not copd either   ? Anemia/ thyroid dz > ruled out by today's labs  ? A bunch of PE's > D dimer nl - while a normal  or high normal value (seen commonly in the elderly or chronically ill)  may miss small peripheral pe, the clot burden with sob is moderately high and the d dimer  has a very high neg pred value if used in this setting but she has unexplained pleuritic cp and sob and   obesity so reasonable to check CTa now  ? CHF/ cardiac asthma > BNP below 100 so very unlikely     I had an extended discussion with the patient reviewing all relevant studies completed to date and  lasting 25 minutes of a 40  minute acute office visit with pt new to me     re  severe non-specific but potentially very serious  refractory respiratory symptoms of uncertain and potentially multiple  Etiologies.  See device teaching which extended face to face time for this visit   Each maintenance medication was reviewed in detail including most importantly the difference between maintenance and prns and under what circumstances the prns are to be triggered using an action plan format that is not reflected in the computer generated alphabetically organized AVS.    Please see AVS for specific instructions unique to this office visit that I personally wrote and verbalized to the the pt in detail and then reviewed with pt  by my nurse highlighting any changes in therapy/plan of care  recommended at today's visit.

## 2017-07-18 ENCOUNTER — Encounter: Payer: Self-pay | Admitting: Internal Medicine

## 2017-07-18 DIAGNOSIS — J45991 Cough variant asthma: Secondary | ICD-10-CM | POA: Insufficient documentation

## 2017-07-18 NOTE — Assessment & Plan Note (Signed)
Chronically on 02 for ? Reason > needs CTa next   Discussed in detail all the  indications, usual  risks and alternatives  relative to the benefits with patient who agrees to proceed with w/u as outlined.

## 2017-07-18 NOTE — Assessment & Plan Note (Addendum)
07/16/2017 d/c trelegy (cough and this is not copd) - 07/16/2017  After extensive coaching inhaler device  effectiveness =    75% try symbicort 80 2bid   Carries dx of vcd with pseudowheeze the most striking part of her exam so rec rx for gerd and add 1st gen H1 blockers per guidelines  While picking the lost dose hfa ics available then return to regroup as this is most likely Upper airway cough syndrome (previously labeled PNDS),  is so named because it's frequently impossible to sort out how much is  CR/sinusitis with freq throat clearing (which can be related to primary GERD)   vs  causing  secondary (" extra esophageal")  GERD from wide swings in gastric pressure that occur with throat clearing, often  promoting self use of mint and menthol lozenges that reduce the lower esophageal sphincter tone and exacerbate the problem further in a cyclical fashion.   These are the same pts (now being labeled as having "irritable larynx syndrome" by some cough centers) who not infrequently have a history of having failed to tolerate ace inhibitors,  dry powder inhalers or biphosphonates (so d/c fosfomax and trelegy) or report having atypical/extraesophageal reflux symptoms that don't respond to standard doses of PPI  and are easily confused as having aecopd or asthma flares by even experienced allergists/ pulmonologists (myself included).

## 2017-07-19 LAB — INTERPRETATION:

## 2017-07-19 LAB — EXTRA LAV TOP TUBE

## 2017-07-19 LAB — RESPIRATORY ALLERGY PROFILE REGION II ~~LOC~~
Allergen, A. alternata, m6: <0.1 kU/L
Allergen, Cedar tree, t12: <0.1 kU/L
Allergen, Comm Silver Birch, t9: <0.1 kU/L
Allergen, Cottonwood, t14: 0.1 kU/L — ABNORMAL HIGH
Allergen, D pternoyssinus,d7: <0.1 kU/L
Allergen, Mouse Urine Protein, e78: <0.1 kU/L
Allergen, Mulberry, t76: <0.1 kU/L
Allergen, Oak,t7: <0.1 kU/L
Allergen, P. notatum, m1: <0.1 kU/L
Aspergillus fumigatus, m3: <0.1 kU/L
Bermuda Grass: <0.1 kU/L
Box Elder IgE: <0.1 kU/L
CLADOSPORIUM HERBARUM (M2) IGE: <0.1 kU/L
COMMON RAGWEED (SHORT) (W1) IGE: <0.1 kU/L
Cat Dander: <0.1 kU/L
Class: 0
Class: 0
Class: 0
Class: 0
Class: 0
Class: 0
Class: 0
Class: 0
Class: 0
Class: 0
Class: 0
Class: 0
Class: 0
Class: 0
Class: 0
Class: 0
Class: 0
Class: 0
Class: 0
Class: 0
Class: 0
Class: 0
Class: 0
Class: 0
Cockroach: <0.1 kU/L
D. farinae: <0.1 kU/L
Dog Dander: <0.1 kU/L
Elm IgE: <0.1 kU/L
IgE (Immunoglobulin E), Serum: 22 kU/L (ref <=114)
Johnson Grass: <0.1 kU/L
Pecan/Hickory Tree IgE: <0.1 kU/L
Rough Pigweed  IgE: <0.1 kU/L
Sheep Sorrel IgE: <0.1 kU/L
Timothy Grass: <0.1 kU/L

## 2017-07-19 LAB — D-DIMER, QUANTITATIVE: D-Dimer, Quant: 0.54 mcg/mL FEU — ABNORMAL HIGH (ref <0.50)

## 2017-07-20 NOTE — Progress Notes (Signed)
Left detailed msg with results

## 2017-07-22 ENCOUNTER — Telehealth: Payer: Self-pay | Admitting: Internal Medicine

## 2017-07-22 NOTE — Telephone Encounter (Signed)
Spoke with the pt and notified of recs per MW and she verbalized understanding Will keep appt with TP to discuss further

## 2017-07-22 NOTE — Telephone Encounter (Signed)
The test for blood clots came back neg as did the chest xray so for now there is no indication for CT chest but we will address this again at next visit to be sure - this is a very expensive test that we have  to justify before actually putting in the order but she can be assured we will go to bat with her insurance company if we do find an indication it needs to be done in the future but not needed now  -

## 2017-07-22 NOTE — Telephone Encounter (Signed)
Called and spoke to patient. Patient stated that at the last OV that MW had mentioned to her he might order a CT scan to see what is going on in her lungs. Patient stated that her labs have come back and now she is ready to have the scan done. From what I see there is not an order for a scan.  MW please advise.

## 2017-07-28 ENCOUNTER — Ambulatory Visit (INDEPENDENT_AMBULATORY_CARE_PROVIDER_SITE_OTHER): Payer: Medicare HMO | Admitting: Family Medicine

## 2017-07-28 ENCOUNTER — Encounter: Payer: Self-pay | Admitting: Family Medicine

## 2017-07-28 VITALS — BP 124/84 | HR 76 | Temp 98.1°F | Ht 65.0 in | Wt 206.0 lb

## 2017-07-28 DIAGNOSIS — M797 Fibromyalgia: Secondary | ICD-10-CM

## 2017-07-28 DIAGNOSIS — F1190 Opioid use, unspecified, uncomplicated: Secondary | ICD-10-CM

## 2017-07-28 MED ORDER — HYDROCODONE-ACETAMINOPHEN 10-325 MG PO TABS: 1.0000 | ORAL_TABLET | Freq: Four times a day (QID) | ORAL | 0 refills | Status: AC | PRN

## 2017-07-28 NOTE — Progress Notes (Signed)
   Subjective:    Patient ID: Laurie Schaefer, female    DOB: 1955-07-18, 62 y.o.   MRN: [id number]  HPI Here for pain management. She is doing well.  Indication for chronic opioid: fibromyalgia  Medication and dose: Norco 10-325 # pills per month: 120 Last UDS date: 04-30-17 Opioid Treatment Agreement signed (Y/N): 07-28-17 Opioid Treatment Agreement last reviewed with patient:  07-28-17 [facility] reviewed this encounter (include red flags):  07-28-17    Review of Systems  Constitutional: Negative.   Respiratory: Negative.   Cardiovascular: Negative.   Musculoskeletal: Positive for myalgias.  Neurological: Negative.        Objective:   Physical Exam  Constitutional: She is oriented to person, place, and time. She appears well-developed and well-nourished.  Cardiovascular: Normal rate, regular rhythm, normal heart sounds and intact distal pulses.  Pulmonary/Chest: Effort normal and breath sounds normal.  Neurological: She is alert and oriented to person, place, and time.          Assessment & Plan:  Pain management, meds were refilled.  Gershon Crane, MD

## 2017-07-30 ENCOUNTER — Encounter: Payer: Medicare HMO | Admitting: Adult Health

## 2017-08-04 ENCOUNTER — Ambulatory Visit: Payer: Medicare HMO | Admitting: Acute Care

## 2017-08-06 ENCOUNTER — Ambulatory Visit: Payer: Medicare HMO | Admitting: Adult Health

## 2017-08-11 ENCOUNTER — Other Ambulatory Visit: Payer: Self-pay | Admitting: Family Medicine

## 2017-08-11 NOTE — Telephone Encounter (Signed)
Call in #[phone] with 5 rf 

## 2017-08-11 NOTE — Telephone Encounter (Signed)
Last OV 07/28/2017   Last refilled 04/09/2017 disp 120 with 3 refills   Sent to PCP for approva

## 2017-08-18 ENCOUNTER — Ambulatory Visit: Payer: Medicare HMO | Admitting: Acute Care

## 2017-08-18 ENCOUNTER — Encounter: Payer: Self-pay | Admitting: Acute Care

## 2017-08-18 VITALS — BP 158/100 | HR 109 | Ht 64.0 in | Wt 208.0 lb

## 2017-08-18 DIAGNOSIS — I10 Essential (primary) hypertension: Secondary | ICD-10-CM

## 2017-08-18 DIAGNOSIS — J45991 Cough variant asthma: Secondary | ICD-10-CM | POA: Diagnosis not present

## 2017-08-18 DIAGNOSIS — J301 Allergic rhinitis due to pollen: Secondary | ICD-10-CM | POA: Diagnosis not present

## 2017-08-18 DIAGNOSIS — R0600 Dyspnea, unspecified: Secondary | ICD-10-CM

## 2017-08-18 DIAGNOSIS — R0602 Shortness of breath: Secondary | ICD-10-CM | POA: Diagnosis not present

## 2017-08-18 DIAGNOSIS — R079 Chest pain, unspecified: Secondary | ICD-10-CM | POA: Diagnosis not present

## 2017-08-18 DIAGNOSIS — R0609 Other forms of dyspnea: Secondary | ICD-10-CM

## 2017-08-18 DIAGNOSIS — R000 Tachycardia, unspecified: Secondary | ICD-10-CM

## 2017-08-18 DIAGNOSIS — J9611 Chronic respiratory failure with hypoxia: Secondary | ICD-10-CM

## 2017-08-18 NOTE — Assessment & Plan Note (Signed)
Blood pressure is elevated today in the office Patient states she is taking her antihypertensive medications as prescribed Plan EKG today Referral to cardiology for high blood pressure and high heart rate. Echo Continiue wearing oxygen at 2 L at rest and 3 L with activity Saturation goal is 88-92% Referral to Pulmonary rehab once BP is under control Follow up with Dr.Byrum or Avyana Puffenbarger NP in 2 weeks. Please contact office for sooner follow up if symptoms do not improve or worsen or seek emergency care

## 2017-08-18 NOTE — Patient Instructions (Addendum)
It is nice to meet you today. EKG today We will schedule PFT's today. Continue to use Symbicort 2 puffs twice daily  Rinse mouth after use. CT Chest without contrast>> worsening dyspnea. Referral to cardiology for high blood pressure and high heart rate. Echo Continiue wearing oxygen at 2 L at rest and 3 L with activity Saturation goal is 88-92% Referral to Pulmonary rehab once BP is under control Follow up with Dr.Byrum or Chaselynn Kepple NP in 2 weeks. Please contact office for sooner follow up if symptoms do not improve or worsen or seek emergency care

## 2017-08-18 NOTE — Assessment & Plan Note (Signed)
Patient states she has nonspecific chest pain at intervals Her husband is trying to get her to go to the emergency room for evaluation but she refuses. Plan Chest x-ray today EKG today Referral to cardiology Advised to seek emergency care, go to the hospital for any worsening in her chest pain or dyspnea

## 2017-08-18 NOTE — Assessment & Plan Note (Signed)
Continue wearing oxygen at 2 L at rest and 3 L with exertion. Remember saturation goal for COPD is 88 to 92%

## 2017-08-18 NOTE — Assessment & Plan Note (Signed)
Continue Symbicort per Dr. Sherene SiresWert Plan EKG today We will schedule PFT's today. Continue to use Symbicort 2 puffs twice daily  Rinse mouth after use. CT Chest without contrast>> worsening dyspnea. Continiue wearing oxygen at 2 L at rest and 3 L with activity Saturation goal is 88-92% Referral to Pulmonary rehab once BP is under control Follow up with Dr.Byrum or Sarah NP in 2 weeks. Please contact office for sooner follow up if symptoms do not improve or worsen or seek emergency care

## 2017-08-18 NOTE — Progress Notes (Addendum)
History of Present Illness Laurie Schaefer is a 62 y.o. female  Former smoker ( Quit [id number]) with withCOPD,vocal cord dysfunctionAndACE related cough.She wears nocturnal oxygen ar 2 L [place]. She is followed by Laurie Schaefer.  08/18/2017 Pt. Presents for follow up. She was seen by Dr. Sherene Schaefer 07/16/2017 for an acute visit for shortness of breath, cough and chest pain..  At that time he discontinued the Trelegy, which she liked, and felt improved her breathing.  However, Dr. Sherene Schaefer  felt her spirometry was more of an asthma picture than COPD.  She was started on Symbicort 2 puffs twice daily.  She states that she has had worsening shortness of breath since Trelegy was discontinued.  She states she is compliant with her GERD medications.  She is currently coughing up gray secretions, and she is complaining of chest pain.  She states that this is been going on for several weeks.  Her husband has been trying to get her to go to the emergency room, but she refuses.  She has a long list of complaints including hypertension, what she considers a fast heart rate, and overall malaise with continued dyspnea.  She presents today on 2 L pulsed oxygen through nasal cannula at 2 L at rest and 3 L with exertion.  Saturations today on her 2 L were 96%.  She is hypertensive today in the office.  She states she is compliant with her antihypertensive medications.  She denies fever, chest pain, orthopnea, or hemoptysis.  She appears anxious today in the office.  She is appropriately concerned about the decline of her pulmonary status.  D-dimer was done at the Jul 16, 2017 office visit.  It was negative therefore CTA was not ordered.  Per her husband when she has had chest pain he has been giving her his nitroglycerin.  I reinforced not sharing medications.  Of note she does not have swelling in her lower extremities.  She states she is compliant with the current therapy Dr. work ordered.  Test Results: Chest x-ray 07/07/2017 There is  no active cardiopulmonary disease.  CBC Latest Ref Rng & Units 07/16/2017 06/29/2017 08/27/2015  WBC 4.0 - 10.5 K/uL 9.1 12.1(H) 7.5  Hemoglobin 12.0 - 15.0 g/dL 96.0 45.4 09.8  Hematocrit 36.0 - 46.0 % 36.5 41.0 40.7  Platelets 150.0 - 400.0 K/uL 328.0 285.0 334    BMP Latest Ref Rng & Units 07/16/2017 06/29/2017 08/27/2015  Glucose 70 - 99 mg/dL 89 89 93  BUN 6 - 23 mg/dL 10 11(B) 19  Creatinine 0.40 - 1.20 mg/dL 1.47 8.29 5.62  Sodium 135 - 145 mEq/L 140 139 139  Potassium 3.5 - 5.1 mEq/L 3.7 3.8 4.5  Chloride 96 - 112 mEq/L 106 102 104  CO2 19 - 32 mEq/L 27 31 25   Calcium 8.4 - 10.5 mg/dL 8.5 9.7 9.5    BNP No results found for: BNP  ProBNP    Component Value Date/Time   PROBNP 74.0 07/16/2017 1549    PFT    Component Value Date/Time   FEV1PRE 1.00 12/27/2015 1248   FEV1POST 0.97 12/27/2015 1248   FVCPRE 1.23 12/27/2015 1248   FVCPOST 1.09 12/27/2015 1248   DLCOUNC 14.59 12/27/2015 1248   PREFEV1FVCRT 81 12/27/2015 1248   PSTFEV1FVCRT 89 12/27/2015 1248    No results found.   Past medical hx Past Medical History:  Diagnosis Date  . Arthritis   . Chronic neck pain    had previously seen Dr. Salli Real [facility]   . COPD (chronic obstructive pulmonary disease) (HCC)    sees Laurie Schaefer   . Depression   . Fibromyalgia   . Fibromyalgia   . Headache   . Hypertension      Social History   Tobacco Use  . Smoking status: Former Smoker    Packs/day: 0.80    Years: 0.25    Pack years: 0.20    Types: Cigarettes    Last attempt to quit: 03/09/1990    Years since quitting: 27.4  . Smokeless tobacco: Never Used  . Tobacco comment: 1 cig every 2 weeks  Substance Use Topics  . Alcohol use: No    Alcohol/week: 0.0 oz  . Drug use: No    [name] reports that she quit smoking about 27 years ago. Her smoking use included cigarettes. She has a 0.20 pack-year smoking history. She has never used smokeless tobacco. She reports that she does not drink  alcohol or use drugs.  Tobacco Cessation: Per epic history states patient is a non-smoker, however I wonder if this is accurate Past surgical hx, Family hx, Social hx all reviewed.  Current Outpatient Medications on File Prior to Visit  Medication Sig  . baclofen (LIORESAL) 10 MG tablet take 1 tablet by mouth twice a day if needed  . budesonide-formoterol (SYMBICORT) 80-4.5 MCG/ACT inhaler Inhale 2 puffs into the lungs 2 (two) times daily.  Marland Kitchen buPROPion (WELLBUTRIN XL) 150 MG 24 hr tablet Take 1 tablet (150 mg total) by mouth daily.  . cholecalciferol (VITAMIN D) 1000 units tablet Take 1,000 Units by mouth daily.  Marland Kitchen estradiol (ESTRACE) 1 MG tablet Take 1 tablet (1 mg total) by mouth daily.  Marland Kitchen Fexofenadine HCl (MUCINEX ALLERGY PO) Take 1 tablet by mouth daily as needed (allergies/congestion.).   Marland Kitchen fluticasone (FLONASE) 50 MCG/ACT nasal spray Place 2 sprays into both nostrils daily.  Marland Kitchen HYDROcodone-acetaminophen (NORCO) 10-325 MG tablet Take 1 tablet by mouth every 6 (six) hours as needed for moderate pain.  Marland Kitchen ibuprofen (ADVIL,MOTRIN) 800 MG tablet Take 1 tablet (800 mg total) by mouth every 6 (six) hours as needed for moderate pain.  Marland Kitchen lamoTRIgine (LAMICTAL) 25 MG tablet Take 1 tablet (25 mg total) by mouth 2 (two) times daily.  Marland Kitchen levalbuterol (XOPENEX) 0.63 MG/3ML nebulizer solution Take 3 mLs (0.63 mg total) by nebulization every 4 (four) hours as needed for wheezing or shortness of breath.  Marland Kitchen LORazepam (ATIVAN) 2 MG tablet Take 1 tablet (2 mg total) by mouth every 8 (eight) hours as needed for anxiety.  Marland Kitchen losartan (COZAAR) 50 MG tablet Take 1 tablet (50 mg total) by mouth daily.  . medroxyPROGESTERone (PROVERA) 2.5 MG tablet Take 1 tablet (2.5 mg total) by mouth daily.  . OXYGEN 3lpm with sleep and exertion if needed  . pantoprazole (PROTONIX) 40 MG tablet Take 1 tablet (40 mg total) by mouth daily.  . QUEtiapine (SEROQUEL) 25 MG tablet TAKE 1 TABLET BY MOUTH EVERY 6 HOURS AS NEEDED  .  zolpidem (AMBIEN) 10 MG tablet Take 1 tablet (10 mg total) by mouth at bedtime as needed for sleep.   No current facility-administered medications on file prior to visit.      Allergies  Allergen Reactions  . Codeine Nausea Only    Review Of Systems:  Constitutional:   No  weight loss, night sweats,  Fevers, chills, +fatigue, or  lassitude.  HEENT:   No headaches,  Difficulty swallowing,  Tooth/dental problems, or  Sore throat,  No sneezing, itching, ear ache, nasal congestion, post nasal drip,   CV:  + chest pain,  No Orthopnea, PND, swelling in lower extremities, anasarca, dizziness, palpitations, syncope.   GI  No heartburn, indigestion, abdominal pain, nausea, vomiting, diarrhea, change in bowel habits, loss of appetite, bloody stools.   Resp: + chronic shortness of breath with exertion or at rest.  + excess mucus,occasional productive cough,  No non-productive cough,  No coughing up of blood.  No change in color of mucus.  No wheezing.  No chest wall deformity  Skin: no rash or lesions.  GU: no dysuria, change in color of urine, no urgency or frequency.  No flank pain, no hematuria   MS:  No joint pain or swelling.  No decreased range of motion.  No back pain.  Psych:  No change in mood or affect. No depression or anxiety.  No memory loss.   Vital Signs BP (!) 158/100 (BP Location: Left Arm, Cuff Size: Normal)   Pulse (!) 109   Ht 5\' 4"  (1.626 m)   Wt 208 lb (94.3 kg)   SpO2 96%   BMI 35.70 kg/m    Physical Exam:  General- No distress,  A&Ox3, pleasant, interactive ENT: No sinus tenderness, TM clear, pale nasal mucosa, no oral exudate,no post nasal drip, no LAN Cardiac: S1, S2, regular rate and rhythm, no murmur Chest: +  wheeze/ No rales/ dullness; no accessory muscle use, no nasal flaring, no sternal retractions Abd.: Soft Non-tender, ND, BS +, Body mass index is 35.7 kg/m. Ext: No clubbing cyanosis, edema Neuro:  normal strength, deconditioned  at baseline, moving all extremities x4, alert and oriented x3, appropriate Skin: No rashes, warm and dry Psych: normal mood and behavior, anxious   Assessment/Plan  Chronic respiratory failure with hypoxia (HCC) Continue wearing oxygen at 2 L at rest and 3 L with exertion. Remember saturation goal for COPD is 88 to 92%  Allergic rhinitis Continue Flonase, Continue Protonix Plan EKG today We will schedule PFT's today. Continue to use Symbicort 2 puffs twice daily  Rinse mouth after use. CT Chest without contrast>> worsening dyspnea. Referral to cardiology for high blood pressure and high heart rate. Echo Continiue wearing oxygen at 2 L at rest and 3 L with activity Saturation goal is 88-92% Referral to Pulmonary rehab once BP is under control Follow up with LaurieByrum or Hafiz Irion NP in 2 weeks. Please contact office for sooner follow up if symptoms do not improve or worsen or seek emergency care     Cough variant asthma vs uacs/vcd Continue Symbicort per Dr. Sherene SiresWert Plan EKG today We will schedule PFT's today. Continue to use Symbicort 2 puffs twice daily  Rinse mouth after use. CT Chest without contrast>> worsening dyspnea. Continiue wearing oxygen at 2 L at rest and 3 L with activity Saturation goal is 88-92% Referral to Pulmonary rehab once BP is under control Follow up with LaurieByrum or Athanasios Heldman NP in 2 weeks. Please contact office for sooner follow up if symptoms do not improve or worsen or seek emergency care      Essential hypertension Blood pressure is elevated today in the office Patient states she is taking her antihypertensive medications as prescribed Plan EKG today Referral to cardiology for high blood pressure and high heart rate. Echo Continiue wearing oxygen at 2 L at rest and 3 L with activity Saturation goal is 88-92% Referral to Pulmonary rehab once BP is under control Follow up with LaurieByrum or Rein Popov NP in  2 weeks. Please contact office for sooner  follow up if symptoms do not improve or worsen or seek emergency care     Nonspecific chest pain Patient states she has nonspecific chest pain at intervals Her husband is trying to get her to go to the emergency room for evaluation but she refuses. Plan Chest x-ray today EKG today Referral to cardiology Advised to seek emergency care, go to the hospital for any worsening in her chest pain or dyspnea    Bevelyn Ngo, NP 08/18/2017  8:18 PM

## 2017-08-18 NOTE — Addendum Note (Signed)
Addended by: Kandice RobinsonsGROCE, SARAH F on: 08/18/2017 08:19 PM   Modules accepted: Level of Service

## 2017-08-18 NOTE — Assessment & Plan Note (Signed)
Continue Flonase, Continue Protonix Plan EKG today We will schedule PFT's today. Continue to use Symbicort 2 puffs twice daily  Rinse mouth after use. CT Chest without contrast>> worsening dyspnea. Referral to cardiology for high blood pressure and high heart rate. Echo Continiue wearing oxygen at 2 L at rest and 3 L with activity Saturation goal is 88-92% Referral to Pulmonary rehab once BP is under control Follow up with Dr.Byrum or Perry Brucato NP in 2 weeks. Please contact office for sooner follow up if symptoms do not improve or worsen or seek emergency care

## 2017-08-19 ENCOUNTER — Other Ambulatory Visit: Payer: Self-pay

## 2017-08-19 ENCOUNTER — Emergency Department (HOSPITAL_COMMUNITY)
Admission: EM | Admit: 2017-08-19 | Discharge: 2017-08-19 | Disposition: A | Discharge status: Home / Self Care | Payer: Medicare HMO | Attending: Emergency Medicine | Admitting: Emergency Medicine

## 2017-08-19 ENCOUNTER — Emergency Department (HOSPITAL_COMMUNITY): Payer: Medicare HMO

## 2017-08-19 ENCOUNTER — Ambulatory Visit: Payer: Self-pay | Admitting: *Deleted

## 2017-08-19 DIAGNOSIS — J449 Chronic obstructive pulmonary disease, unspecified: Secondary | ICD-10-CM | POA: Diagnosis not present

## 2017-08-19 DIAGNOSIS — R0602 Shortness of breath: Secondary | ICD-10-CM | POA: Diagnosis not present

## 2017-08-19 DIAGNOSIS — I10 Essential (primary) hypertension: Secondary | ICD-10-CM | POA: Diagnosis not present

## 2017-08-19 DIAGNOSIS — R091 Pleurisy: Secondary | ICD-10-CM | POA: Diagnosis not present

## 2017-08-19 DIAGNOSIS — Z79899 Other long term (current) drug therapy: Secondary | ICD-10-CM | POA: Diagnosis not present

## 2017-08-19 DIAGNOSIS — R0789 Other chest pain: Secondary | ICD-10-CM | POA: Diagnosis present

## 2017-08-19 DIAGNOSIS — F419 Anxiety disorder, unspecified: Secondary | ICD-10-CM | POA: Diagnosis not present

## 2017-08-19 DIAGNOSIS — Z87891 Personal history of nicotine dependence: Secondary | ICD-10-CM | POA: Diagnosis not present

## 2017-08-19 LAB — COMPREHENSIVE METABOLIC PANEL
ALT: 19 U/L (ref 14–54)
AST: 22 U/L (ref 15–41)
Albumin: 3.7 g/dL (ref 3.5–5.0)
Alkaline Phosphatase: 57 U/L (ref 38–126)
Anion gap: 8 (ref 5–15)
BUN: 13 mg/dL (ref 6–20)
CO2: 28 mmol/L (ref 22–32)
Calcium: 9 mg/dL (ref 8.9–10.3)
Chloride: 106 mmol/L (ref 101–111)
Creatinine, Ser: 0.67 mg/dL (ref 0.44–1.00)
GFR calc Af Amer: >60 mL/min (ref >60)
GFR calc non Af Amer: >60 mL/min (ref >60)
Glucose, Bld: 101 mg/dL — ABNORMAL HIGH (ref 65–99)
Potassium: 3.9 mmol/L (ref 3.5–5.1)
Sodium: 142 mmol/L (ref 135–145)
Total Bilirubin: 0.6 mg/dL (ref 0.3–1.2)
Total Protein: 6.6 g/dL (ref 6.5–8.1)

## 2017-08-19 LAB — CBC WITH DIFFERENTIAL/PLATELET
Basophils Absolute: 0.1 10*3/uL (ref 0.0–0.1)
Basophils Relative: 1 %
Eosinophils Absolute: 0 10*3/uL (ref 0.0–0.7)
Eosinophils Relative: 0 %
HCT: 38.9 % (ref 36.0–46.0)
Hemoglobin: 13.1 g/dL (ref 12.0–15.0)
Lymphocytes Relative: 30 %
Lymphs Abs: 1.9 10*3/uL (ref 0.7–4.0)
MCH: 29.5 pg (ref 26.0–34.0)
MCHC: 33.7 g/dL (ref 30.0–36.0)
MCV: 87.6 fL (ref 78.0–100.0)
Monocytes Absolute: 0.8 10*3/uL (ref 0.1–1.0)
Monocytes Relative: 12 %
Neutro Abs: 3.8 10*3/uL (ref 1.7–7.7)
Neutrophils Relative %: 57 %
Platelets: 266 10*3/uL (ref 150–400)
RBC: 4.44 MIL/uL (ref 3.87–5.11)
RDW: 13.4 % (ref 11.5–15.5)
WBC: 6.5 10*3/uL (ref 4.0–10.5)

## 2017-08-19 LAB — BRAIN NATRIURETIC PEPTIDE: B Natriuretic Peptide: 24.6 pg/mL (ref 0.0–100.0)

## 2017-08-19 LAB — TROPONIN I
Troponin I: <0.03 ng/mL (ref <0.03)
Troponin I: <0.03 ng/mL (ref <0.03)

## 2017-08-19 MED ORDER — IPRATROPIUM-ALBUTEROL 0.5-2.5 (3) MG/3ML IN SOLN
3.0000 mL | Freq: Once | RESPIRATORY_TRACT | Status: AC
  Administered 2017-08-19: 3 mL via RESPIRATORY_TRACT
  Filled 2017-08-19: qty 3

## 2017-08-19 MED ORDER — RANITIDINE HCL 150 MG PO CAPS: 150.0000 mg | ORAL_CAPSULE | Freq: Two times a day (BID) | ORAL | 0 refills | Status: DC

## 2017-08-19 MED ORDER — METHYLPREDNISOLONE 4 MG PO TBPK: ORAL_TABLET | ORAL | 0 refills | Status: DC

## 2017-08-19 MED ORDER — IOPAMIDOL (ISOVUE-370) INJECTION 76%
INTRAVENOUS | Status: AC
  Administered 2017-08-19: 14:00:00
  Filled 2017-08-19: qty 100

## 2017-08-19 MED ORDER — KETOROLAC TROMETHAMINE 10 MG PO TABS: ORAL_TABLET | ORAL | 0 refills | Status: DC

## 2017-08-19 MED ORDER — MORPHINE SULFATE (PF) 4 MG/ML IV SOLN
4.0000 mg | Freq: Once | INTRAVENOUS | Status: AC
  Administered 2017-08-19: 4 mg via INTRAVENOUS
  Filled 2017-08-19: qty 1

## 2017-08-19 MED ORDER — IOPAMIDOL (ISOVUE-370) INJECTION 76%
100.0000 mL | Freq: Once | INTRAVENOUS | Status: AC | PRN
  Administered 2017-08-19: 100 mL via INTRAVENOUS

## 2017-08-19 MED ORDER — INDOMETHACIN 50 MG PO CAPS: 50.0000 mg | ORAL_CAPSULE | Freq: Three times a day (TID) | ORAL | 0 refills | Status: DC

## 2017-08-19 MED ORDER — KETOROLAC TROMETHAMINE 15 MG/ML IJ SOLN
15.0000 mg | Freq: Once | INTRAMUSCULAR | Status: AC
  Administered 2017-08-19: 15 mg via INTRAVENOUS
  Filled 2017-08-19: qty 1

## 2017-08-19 NOTE — ED Triage Notes (Addendum)
Pt states on and off chest pain for several days. Pt states her BP was elevated using her home machine (163/120)

## 2017-08-19 NOTE — Telephone Encounter (Signed)
Pt called with several complaints. She states that she is having some chest tightness because she is congested. Has hx of COPD and on 3 liters of O2.  The struggle to talk and breath could be heard over the phone. She also states that her b/p is elevated. 165/113, HR  109-96, O2 sat went from 95-85 on 3 L.Pt advised that she needs to go to the emergency department at the closest hospital to be checked out.  She was trying to make an appointment with Dr. Clent RidgesFry for today.   Pt voiced understanding and will get her friend to take her Laurie Schaefer now.  Will route to [facility] at [place].  Reason for Disposition . [1] MODERATE difficulty breathing (e.g., speaks in phrases, SOB even at rest, pulse 100-120) AND [2] NEW-onset or WORSE than normal  Answer Assessment - Initial Assessment Questions 1. RESPIRATORY STATUS: "Describe your breathing?" (e.g., wheezing, shortness of breath, unable to speak, severe coughing)      Shortness of breath 2. ONSET: "When did this breathing problem begin?"      2 weeks and has gotten progressively worst 3. PATTERN "Does the difficult breathing come and go, or has it been constant since it started?"      constant 4. SEVERITY: "How bad is your breathing?" (e.g., mild, moderate, severe)    - MILD: No SOB at rest, mild SOB with walking, speaks normally in sentences, can lay down, no retractions, pulse < 100.    - MODERATE: SOB at rest, SOB with minimal exertion and prefers to sit, cannot lie down flat, speaks in phrases, mild retractions, audible wheezing, pulse 100-120.    - SEVERE: Very SOB at rest, speaks in single words, struggling to breathe, sitting hunched forward, retractions, pulse > 120      Moderate to severe 5. RECURRENT SYMPTOM: "Have you had difficulty breathing before?" If so, ask: "When was the last time?" and "What happened that time?"      Yes took medication and use inhalers 6. CARDIAC HISTORY: "Do you have any history of heart disease?" (e.g., heart attack,  angina, bypass surgery, angioplasty)      Was told that she had heart event 7. LUNG HISTORY: "Do you have any history of lung disease?"  (e.g., pulmonary embolus, asthma, emphysema)     COPD 8. CAUSE: "What do you think is causing the breathing problem?"      Progression of disease 9. OTHER SYMPTOMS: "Do you have any other symptoms? (e.g., dizziness, runny nose, cough, chest pain, fever)     Lightheaded, runny nose, feels feverish 11. TRAVEL: "Have you traveled out of the country in the last month?" (e.g., travel history, exposures)       no  Protocols used: BREATHING DIFFICULTY-A-AH

## 2017-08-19 NOTE — Telephone Encounter (Signed)
Will monitor for ED arrival.  

## 2017-08-19 NOTE — Discharge Instructions (Addendum)
For your pleurisy:  - Start taking the prescribed Indomethacin, with meals  - Take Tylenol 1000 mg every 6 hours  - Take your Gabapentin, but break the 600 mg tablets into 300 mg tablets starting once at night, then twice a day for 3 days, then up to three times a day - You can use over-the-counter LIDOCAINE patches to help with pain as well  Drink at least 6-8 glasses of water daily  Light exercise and movement is okay  Follow-up with your doctor in 3-5 days

## 2017-08-19 NOTE — Telephone Encounter (Signed)
Confirmed patient has arrived in the ER now. 

## 2017-08-19 NOTE — ED Provider Notes (Signed)
[facility]-EMERGENCY DEPT Provider Note   CSN: [id number] Arrival date & time: 08/19/17  1012     History   Chief Complaint Chief Complaint  Patient presents with  . Chest Pain    HPI Brenisha Tsui Demarco is a 62 y.o. female.  HPI   62 year old female with past medical history as below including fibromyalgia, COPD, here with chronic chest pain and shortness of breath.  The patient states that for the last several weeks, she has had an intermittent sharp, pleuritic, positional right-sided chest pain.  This pain radiates down both her arms and is sharp and stabbing.  Is worse with movement and deep inspiration.  She is also had occasional shortness of breath and cough.  She is on chronic oxygen and is followed by Dr. Delton Coombes.  She denies any increased sputum production.  She endorses increasing stress and anxiety associated with this pain.  It all began after a course of back to back pneumonia several months ago.  Denies any alleviating factors.  No known fevers.  No known coronary disease.  Past Medical History:  Diagnosis Date  . Arthritis   . Chronic neck pain    had previously seen Dr. Salli Real at [facility]   . COPD (chronic obstructive pulmonary disease) (HCC)    sees Dr. Delton Coombes   . Depression   . Fibromyalgia   . Fibromyalgia   . Headache   . Hypertension     Patient Active Problem List   Diagnosis Date Noted  . Nonspecific chest pain 08/18/2017  . Cough variant asthma vs uacs/vcd 07/18/2017  . DOE (dyspnea on exertion) 07/16/2017  . Lingular pneumonia 06/02/2017  . Influenza A 05/07/2017  . Fibromyalgia 05/03/2017  . Migraines 04/12/2017  . Chronic respiratory failure with hypoxia (HCC) 04/01/2017  . Allergic rhinitis 03/13/2016  . Vocal cord dysfunction 01/29/2016  . COPD (chronic obstructive pulmonary disease) (HCC) 12/19/2015  . Obesity 12/19/2015  . Bipolar I disorder (HCC) 08/27/2015  . INSOMNIA, CHRONIC 11/03/2006  .  Depression 11/03/2006  . Essential hypertension 11/03/2006  . MENOPAUSAL SYNDROME 11/03/2006  . Osteoarthritis 11/03/2006  . NECK PAIN, CHRONIC 11/03/2006    Past Surgical History:  Procedure Laterality Date  . FRACTURE SURGERY       OB History   None      Home Medications    Prior to Admission medications   Medication Sig Start Date End Date Taking? Authorizing Provider  budesonide-formoterol (SYMBICORT) 80-4.5 MCG/ACT inhaler Inhale 2 puffs into the lungs 2 (two) times daily. 07/16/17  Yes Nyoka Cowden, MD  buPROPion (WELLBUTRIN XL) 150 MG 24 hr tablet Take 1 tablet (150 mg total) by mouth daily. 06/29/17  Yes Nelwyn Salisbury, MD  cholecalciferol (VITAMIN D) 1000 units tablet Take 1,000 Units by mouth daily.   Yes [provider]  estradiol (ESTRACE) 1 MG tablet Take 1 tablet (1 mg total) by mouth daily. 04/09/17  Yes Nelwyn Salisbury, MD  Fexofenadine HCl [facility] PO) Take 1 tablet by mouth daily as needed (allergies/congestion.).    Yes [provider]  fluticasone (FLONASE) 50 MCG/ACT nasal spray Place 2 sprays into both nostrils daily. 03/13/16  Yes Leslye Peer, MD  HYDROcodone-acetaminophen (NORCO) 10-325 MG tablet Take 1 tablet by mouth every 6 (six) hours as needed for moderate pain. 07/28/17 08/27/17 Yes Nelwyn Salisbury, MD  ibuprofen (ADVIL,MOTRIN) 800 MG tablet Take 1 tablet (800 mg total) by mouth every 6 (six) hours as needed  for moderate pain. 04/09/17  Yes Nelwyn Salisbury, MD  lamoTRIgine (LAMICTAL) 25 MG tablet Take 1 tablet (25 mg total) by mouth 2 (two) times daily. 04/09/17  Yes Nelwyn Salisbury, MD  levalbuterol Pauline Aus) 0.63 MG/3ML nebulizer solution Take 3 mLs (0.63 mg total) by nebulization every 4 (four) hours as needed for wheezing or shortness of breath. 06/17/17  Yes Bevelyn Ngo, NP  LORazepam (ATIVAN) 2 MG tablet Take 1 tablet (2 mg total) by mouth every 8 (eight) hours as needed for anxiety. 06/29/17  Yes Nelwyn Salisbury, MD  losartan  (COZAAR) 50 MG tablet Take 1 tablet (50 mg total) by mouth daily. 06/29/17  Yes Nelwyn Salisbury, MD  medroxyPROGESTERone (PROVERA) 2.5 MG tablet Take 1 tablet (2.5 mg total) by mouth daily. 04/09/17  Yes Nelwyn Salisbury, MD  OXYGEN 3lpm with sleep and exertion if needed   Yes [provider]  pantoprazole (PROTONIX) 40 MG tablet Take 1 tablet (40 mg total) by mouth daily. 04/09/17  Yes Nelwyn Salisbury, MD  QUEtiapine (SEROQUEL) 25 MG tablet TAKE 1 TABLET BY MOUTH EVERY 6 HOURS AS NEEDED 08/11/17  Yes Nelwyn Salisbury, MD  zolpidem (AMBIEN) 10 MG tablet Take 1 tablet (10 mg total) by mouth at bedtime as needed for sleep. 04/09/17  Yes Nelwyn Salisbury, MD  ketorolac (TORADOL) 10 MG tablet Take 10 mg every 6 hours for 2 days, then every 8 hours for 3 more days. Take with food and antacids. 08/19/17   Shaune Pollack, MD  methylPREDNISolone (MEDROL DOSEPAK) 4 MG TBPK tablet Take as directed on package 08/19/17   Shaune Pollack, MD  ranitidine (ZANTAC) 150 MG capsule Take 1 capsule (150 mg total) by mouth 2 (two) times daily for 10 days. 08/19/17 08/29/17  Shaune Pollack, MD    Family History No family history on file.  Social History Social History   Tobacco Use  . Smoking status: Former Smoker    Packs/day: 0.80    Years: 0.25    Pack years: 0.20    Types: Cigarettes    Last attempt to quit: 03/09/1990    Years since quitting: 27.4  . Smokeless tobacco: Never Used  . Tobacco comment: 1 cig every 2 weeks  Substance Use Topics  . Alcohol use: No    Alcohol/week: 0.0 oz  . Drug use: No     Allergies   Codeine   Review of Systems Review of Systems  Constitutional: Positive for fatigue. Negative for chills and fever.  HENT: Negative for congestion and rhinorrhea.   Eyes: Negative for visual disturbance.  Respiratory: Positive for cough and chest tightness. Negative for shortness of breath and wheezing.   Cardiovascular: Positive for chest pain. Negative for leg swelling.  Gastrointestinal:  Negative for abdominal pain, diarrhea, nausea and vomiting.  Genitourinary: Negative for dysuria and flank pain.  Musculoskeletal: Negative for neck pain and neck stiffness.  Skin: Negative for rash and wound.  Allergic/Immunologic: Negative for immunocompromised state.  Neurological: Positive for weakness. Negative for syncope and headaches.  All other systems reviewed and are negative.    Physical Exam Updated Vital Signs BP 129/81   Pulse 87   Temp 97.8 F (36.6 C) (Oral)   Resp 16   Ht 5\' 5"  (1.651 m)   Wt 94.3 kg (208 lb)   SpO2 99%   BMI 34.61 kg/m   Physical Exam  Constitutional: She is oriented to person, place, and time. She appears well-developed and well-nourished. No  distress.  HENT:  Head: Normocephalic and atraumatic.  Eyes: Conjunctivae are normal.  Neck: Neck supple.  Cardiovascular: Normal rate, regular rhythm and normal heart sounds. Exam reveals no friction rub.  No murmur heard. Pulmonary/Chest: Effort normal and breath sounds normal. No respiratory distress. She has no wheezes. She has no rales.  Mild tenderness to palpation over right anterior chest wall, worse with movement.  Abdominal: She exhibits no distension.  Musculoskeletal: She exhibits no edema.  Neurological: She is alert and oriented to person, place, and time. She exhibits normal muscle tone.  Skin: Skin is warm. Capillary refill takes less than 2 seconds.  Psychiatric: She has a normal mood and affect.  Nursing note and vitals reviewed.    ED Treatments / Results  Labs (all labs ordered are listed, but only abnormal results are displayed) Labs Reviewed  COMPREHENSIVE METABOLIC PANEL - Abnormal; Notable for the following components:      Result Value   Glucose, Bld 101 (*)    All other components within normal limits  CBC WITH DIFFERENTIAL/PLATELET  BRAIN NATRIURETIC PEPTIDE  TROPONIN I  TROPONIN I    EKG None  Radiology Dg Chest 2 View  Result Date: 08/19/2017 CLINICAL  DATA:  62 year old female with chest pain, shortness of breath, hypertension. EXAM: CHEST - 2 VIEW COMPARISON:  07/07/2017 and earlier. FINDINGS: Chronically low lung volumes. Mediastinal contours remain normal. Visualized tracheal air column is within normal limits. No pneumothorax, pulmonary edema, pleural effusion or confluent pulmonary opacity. No acute osseous abnormality identified. Negative visible bowel gas pattern. IMPRESSION: Chronic low lung volumes.  No acute cardiopulmonary abnormality. Electronically Signed   By: Odessa FlemingH  Hall M.D.   On: 08/19/2017 11:38   Ct Angio Chest Pe W And/or Wo Contrast  Result Date: 08/19/2017 CLINICAL DATA:  Chest pain for several days. EXAM: CT ANGIOGRAPHY CHEST WITH CONTRAST TECHNIQUE: Multidetector CT imaging of the chest was performed using the standard protocol during bolus administration of intravenous contrast. Multiplanar CT image reconstructions and MIPs were obtained to evaluate the vascular anatomy. CONTRAST:  100mL ISOVUE-370 IOPAMIDOL (ISOVUE-370) INJECTION 76% COMPARISON:  None. FINDINGS: Cardiovascular: Satisfactory opacification of the pulmonary arteries to the segmental level. No evidence of pulmonary embolism. Normal heart size. No pericardial effusion. Normal caliber aorta. No thoracic aortic dissection. Mediastinum/Nodes: No enlarged mediastinal, hilar, or axillary lymph nodes. Thyroid gland, trachea, and esophagus demonstrate no significant findings. Lungs/Pleura: Lingular atelectasis. No focal consolidation, pleural effusion or pneumothorax. Upper Abdomen: No acute upper abdominal abnormality. Musculoskeletal: No acute osseous abnormality. No aggressive osseous lesion. Dextrocurvature of the thoracic spine. Review of the MIP images confirms the above findings. IMPRESSION: 1. No evidence of pulmonary embolus. 2. No acute cardiopulmonary disease. Electronically Signed   By: Elige KoHetal  Patel   On: 08/19/2017 13:34    Procedures Procedures (including critical  care time)  Medications Ordered in ED Medications  morphine 4 MG/ML injection 4 mg (4 mg Intravenous Given 08/19/17 1218)  ipratropium-albuterol (DUONEB) 0.5-2.5 (3) MG/3ML nebulizer solution 3 mL (3 mLs Nebulization Given 08/19/17 1218)  iopamidol (ISOVUE-370) 76 % injection (  Contrast Given 08/19/17 1359)  iopamidol (ISOVUE-370) 76 % injection 100 mL (100 mLs Intravenous Contrast Given 08/19/17 1315)  ketorolac (TORADOL) 15 MG/ML injection 15 mg (15 mg Intravenous Given 08/19/17 1356)     Initial Impression / Assessment and Plan / ED Course  I have reviewed the triage vital signs and the nursing notes.  Pertinent labs & imaging results that were available during my care of the  patient were reviewed by me and considered in my medical decision making (see chart for details).     62 yo F with h/o COPD, FM here with R-sided chest wall pain. Pain is sharp, pleuritic, began after PNA.  I suspect she likely has pleuritic pain secondary to possible post pneumonia pleurisy.  Given the duration of her symptoms, CT angios obtained and is negative for underlying abnormality.  She may also have component of chest wall pain and underlying fibromyalgia.  Otherwise, EKG is nonischemic and troponins are negative x2 and I do not suspect ACS.  No evidence of dissection clinically or on scan.  Lab work is otherwise reassuring.  Patient had excellent relief after IV Toradol.  Discussed that we will treated with oral anti-inflammatories and a short course of steroids.  Patient is adamant that the Toradol worked better than anything she had taken by mouth.  I discussed the risks of taking oral Toradol for more than 5 days and will give her a very brief course, with antacids as well and discussed indications to stop.  Otherwise, will place her on a Medrol Dosepak and discharged home.  Final Clinical Impressions(s) / ED Diagnoses   Final diagnoses:  Pleurisy    ED Discharge Orders        Ordered    indomethacin  (INDOCIN) 50 MG capsule  3 times daily with meals,   Status:  Discontinued     08/19/17 1544    ranitidine (ZANTAC) 150 MG capsule  2 times daily     08/19/17 1544    methylPREDNISolone (MEDROL DOSEPAK) 4 MG TBPK tablet     08/19/17 1544    ketorolac (TORADOL) 10 MG tablet    Note to Pharmacy:  Do NOT take with Indocin   08/19/17 1703       Shaune Pollack, MD 08/19/17 1734

## 2017-08-23 ENCOUNTER — Ambulatory Visit: Payer: Self-pay | Admitting: *Deleted

## 2017-08-23 ENCOUNTER — Ambulatory Visit (INDEPENDENT_AMBULATORY_CARE_PROVIDER_SITE_OTHER): Payer: Medicare HMO | Admitting: Family Medicine

## 2017-08-23 ENCOUNTER — Encounter: Payer: Self-pay | Admitting: Family Medicine

## 2017-08-23 VITALS — BP 150/98 | HR 114 | Temp 97.7°F | Ht 65.0 in | Wt 210.4 lb

## 2017-08-23 DIAGNOSIS — I10 Essential (primary) hypertension: Secondary | ICD-10-CM

## 2017-08-23 DIAGNOSIS — M940 Chondrocostal junction syndrome [Tietze]: Secondary | ICD-10-CM

## 2017-08-23 DIAGNOSIS — J439 Emphysema, unspecified: Secondary | ICD-10-CM | POA: Diagnosis not present

## 2017-08-23 DIAGNOSIS — M797 Fibromyalgia: Secondary | ICD-10-CM

## 2017-08-23 MED ORDER — LOSARTAN POTASSIUM-HCTZ 100-25 MG PO TABS: 1.0000 | ORAL_TABLET | Freq: Every day | ORAL | 3 refills | Status: DC

## 2017-08-23 MED ORDER — BUPROPION HCL ER (XL) 300 MG PO TB24: 300.0000 mg | ORAL_TABLET | Freq: Every day | ORAL | 5 refills | Status: DC

## 2017-08-23 NOTE — Telephone Encounter (Signed)
[place] send to [place] to make aware that [facility]

## 2017-08-23 NOTE — Telephone Encounter (Addendum)
Patient was seen at ED- last week. Patient was diagnosed pluracey. They did get the BP down- but she still has evaluated BP. Patient has appointments tomorrow for more testing. Patient reports her BP has been elevated all week end.  Patient is calling this morning to report she is feeling bad- her BP has been up all week end. She does not want to go back to the hospital. Call to office- they will do a hospital f/u for her today- made patient appointment. Advised patient if she starts feeling worse- if her BP does not go down- she needs to go to ED.Patient did say if she got worse she would go- and she is aware she may be sent back by PCP.  Reason for Disposition . [1] Systolic BP  >= 160 OR Diastolic >= 100 AND [2] cardiac or neurologic symptoms (e.g., chest pain, difficulty breathing, unsteady gait, blurred vision)  Answer Assessment - Initial Assessment Questions 1. BLOOD PRESSURE: "What is the blood pressure?" "Did you take at least two measurements 5 minutes apart?"     143/106 P 78      145/125 P 85 2. ONSET: "When did you take your blood pressure?"     This morning 3. HOW: "How did you obtain the blood pressure?" (e.g., visiting nurse, automatic home BP monitor)     automatic 4. HISTORY: "Do you have a history of high blood pressure?"     yes 5. MEDICATIONS: "Are you taking any medications for blood pressure?" "Have you missed any doses recently?"     Losartan- no missed doses  6. OTHER SYMPTOMS: "Do you have any symptoms?" (e.g., headache, chest pain, blurred vision, difficulty breathing, weakness)     COPD history, headache, chest pain, weakness 7. PREGNANCY: "Is there any chance you are pregnant?" "When was your last menstrual period?"     n/a  Protocols used: HIGH BLOOD PRESSURE-A-AH

## 2017-08-23 NOTE — Progress Notes (Signed)
   Subjective:    Patient ID: Sabas SousPamela White Gacek, female    DOB: 1955-09-02, 62 y.o.   MRN: [id number]  HPI Here to follow up an ER visit on 08-19-17 for pleuritic chest pain. This started about 3 weeks ago. The pain is sharp, it is worst on the right side of the chest, and it radiates down both arms. On exam she was tender to palpation. A chest CT angiogram was normal. She was told she has "pleurisy" and she was treated with IV Toradol and then an oral steroid taper. Since then the pain has gradually been going away. Also for several weeks her BP has been running quite high, sometimes up to 170s over 120s.    Review of Systems  Constitutional: Negative.   Respiratory: Positive for cough and shortness of breath.   Cardiovascular: Positive for chest pain. Negative for palpitations and leg swelling.  Neurological: Negative.        Objective:   Physical Exam  Constitutional: She is oriented to person, place, and time. She appears well-developed and well-nourished.  Wearing her oxygen   Cardiovascular: Normal rate, regular rhythm, normal heart sounds and intact distal pulses.  Pulmonary/Chest: Effort normal. No stridor. No respiratory distress. She has no wheezes. She has no rales.  Scattered dry crackles. She is tender over both sternal margins   Neurological: She is alert and oriented to person, place, and time.          Assessment & Plan:  She has costochondritis, and I explained this is a benign and self limited process. It should resolve in a week or so. For the HTN, we will stop Losartan 50 mg and start her on Losartan HCT 100/25 daily. Recheck in 3 weeks.  Gershon CraneStephen Meika Earll, MD

## 2017-08-24 ENCOUNTER — Telehealth: Payer: Self-pay | Admitting: Acute Care

## 2017-08-24 NOTE — Telephone Encounter (Signed)
Spoke with pt. She is aware of Sarah's response. Pt wants to know the quality of her lungs and how far advanced her COPD is.

## 2017-08-24 NOTE — Telephone Encounter (Signed)
PFT's will give us that answer. There is an order in the computer for PFT's but she is not scheduled. Please get her scheduled for PFT's before OV 6/26. Thanks so much.

## 2017-08-24 NOTE — Telephone Encounter (Signed)
Please let the patient know this it is ok not to repeat the scan. The CT done in the ED does not show any reason that would be contributing to her dyspnea. Thanks

## 2017-08-24 NOTE — Telephone Encounter (Signed)
Spoke with pt. She has been scheduled for a PFT on 08/26/17 at 12pm. Nothing further was needed.

## 2017-08-24 NOTE — Telephone Encounter (Signed)
Spoke with pt. States that she is scheduled for a CT chest on 08/25/17. Pt had a CT angio on 08/19/17. Her insurance is not going to pay for another CT chest so soon. I have called CT and canceled this appointment. Will route message to Maralyn SagoSarah so that she may review the CT that was done on 08/19/17.

## 2017-08-25 ENCOUNTER — Inpatient Hospital Stay: Admission: RE | Admit: 2017-08-25 | Payer: Medicare HMO | Source: Ambulatory Visit

## 2017-08-25 ENCOUNTER — Ambulatory Visit (HOSPITAL_COMMUNITY): Payer: Medicare HMO | Attending: Cardiology

## 2017-08-25 ENCOUNTER — Other Ambulatory Visit: Payer: Self-pay

## 2017-08-25 DIAGNOSIS — J449 Chronic obstructive pulmonary disease, unspecified: Secondary | ICD-10-CM | POA: Diagnosis not present

## 2017-08-25 DIAGNOSIS — I10 Essential (primary) hypertension: Secondary | ICD-10-CM | POA: Insufficient documentation

## 2017-08-25 DIAGNOSIS — R0600 Dyspnea, unspecified: Secondary | ICD-10-CM | POA: Insufficient documentation

## 2017-08-25 DIAGNOSIS — R000 Tachycardia, unspecified: Secondary | ICD-10-CM | POA: Insufficient documentation

## 2017-08-25 MED ORDER — PERFLUTREN LIPID MICROSPHERE
1.0000 mL | INTRAVENOUS | Status: AC | PRN
  Administered 2017-08-25: 3 mL via INTRAVENOUS

## 2017-08-26 ENCOUNTER — Encounter: Payer: Self-pay | Admitting: Acute Care

## 2017-08-26 NOTE — Telephone Encounter (Signed)
Sarah, please advise on patients message, thank you.

## 2017-08-28 ENCOUNTER — Encounter: Payer: Self-pay | Admitting: Acute Care

## 2017-08-30 ENCOUNTER — Ambulatory Visit (INDEPENDENT_AMBULATORY_CARE_PROVIDER_SITE_OTHER): Payer: Medicare HMO | Admitting: Internal Medicine

## 2017-08-30 DIAGNOSIS — R0609 Other forms of dyspnea: Secondary | ICD-10-CM

## 2017-08-30 DIAGNOSIS — R0600 Dyspnea, unspecified: Secondary | ICD-10-CM

## 2017-08-30 LAB — PULMONARY FUNCTION TEST
DL/VA % pred: 87 %
DL/VA: 4.12 ml/min/mmHg/L
DLCO cor % pred: 67 %
DLCO cor: 16.03 ml/min/mmHg
DLCO unc % pred: 67 %
DLCO unc: 15.88 ml/min/mmHg
FEF 25-75 Post: 0.72 L/sec
FEF 25-75 Pre: 0.63 L/sec
FEF2575-%Change-Post: 15 %
FEF2575-%Pred-Post: 33 %
FEF2575-%Pred-Pre: 28 %
FEV1-%Change-Post: -8 %
FEV1-%Pred-Post: 42 %
FEV1-%Pred-Pre: 46 %
FEV1-Post: 1.02 L
FEV1-Pre: 1.12 L
FEV1FVC-%Change-Post: -11 %
FEV1FVC-%Pred-Pre: 81 %
FEV6-%Change-Post: 3 %
FEV6-%Pred-Post: 60 %
FEV6-%Pred-Pre: 58 %
FEV6-Post: 1.84 L
FEV6-Pre: 1.78 L
FEV6FVC-%Pred-Post: 104 %
FEV6FVC-%Pred-Pre: 104 %
FVC-%Change-Post: 3 %
FVC-%Pred-Post: 58 %
FVC-%Pred-Pre: 56 %
FVC-Post: 1.84 L
FVC-Pre: 1.78 L
Post FEV1/FVC ratio: 55 %
Post FEV6/FVC ratio: 100 %
Pre FEV1/FVC ratio: 63 %
Pre FEV6/FVC Ratio: 100 %
RV % pred: 65 %
RV: 1.32 L
TLC % pred: 82 %
TLC: 4.1 L

## 2017-08-30 NOTE — Telephone Encounter (Signed)
Sarah please advise on patients e-mail below. Thank you.  

## 2017-08-30 NOTE — Progress Notes (Signed)
PFT completed today. 08/30/17  

## 2017-08-31 NOTE — Progress Notes (Signed)
History of Present Illness Laurie Schaefer is a 62 y.o. female some day  smoker   with COPD,vocal cord dysfunctionAndACE related cough.She wears oxygen ar 2 L [place]. She is followed by Dr. Delton Coombes.   09/01/2017 Follow up: Pt. Presents for follow up. Pt. continues to complain that Symbicort does not work for her the way Trelegy did. She continued to complain of the heat making her breathing worse. She is compliant with her Symbicort and her neb treatments. She is using her nebs twice daily. She states she has been very emotional and her PCP has increased her Wellbutrin.She wants to talk about her BP. Her PCP has made some medication changes. Her Losartan 50 mg was changed to Losartan HCT 100/25 daily. Her BP is still elevated but she is taking the readings when she is anxious. She denies cough, purulent secretions, or worsening of her shortness of breath. She states he costochondritis she has complained of for months is a bit better with the use of Indocin. ( ED visit 08/19/2017), pred taper and Toradol.  I have referred her to Dr. Abran Cantor who is managing her BP regimen for follow up. She denies fever, chest pain, orthopnea  or hemoptysis.   Test Results:  PFT's 08/30/2017 FVC 1.78 or 56% predicted FEV1 1.12 or 46% predicted F/F Ratio 63% or 81% predicted SVC 2.77 or 88% predicted TLC 4.10 or 82% predicted DLCO 67% predicted Severe obstruction with air trapping and reduced Diffusion capacity  CTA 08/19/2017 IMPRESSION: 1. No evidence of pulmonary embolus. 2. No acute cardiopulmonary disease.  Echo 08/25/2017 Left ventricle: The cavity size was normal. Wall thickness was   increased in a pattern of moderate LVH. Systolic function was   vigorous. The estimated ejection fraction was in the range of 65%   to 70%. Wall motion was normal; there were no regional wall   motion abnormalities. Doppler parameters are consistent with   abnormal left ventricular relaxation (grade 1 diastolic  dysfunction).  Chest x-ray 07/07/2017 There is no active cardiopulmonary disease   CBC Latest Ref Rng & Units 08/19/2017 07/16/2017 06/29/2017  WBC 4.0 - 10.5 K/uL 6.5 9.1 12.1(H)  Hemoglobin 12.0 - 15.0 g/dL 95.2 84.1 32.4  Hematocrit 36.0 - 46.0 % 38.9 36.5 41.0  Platelets 150 - 400 K/uL 266 328.0 285.0    BMP Latest Ref Rng & Units 08/19/2017 07/16/2017 06/29/2017  Glucose 65 - 99 mg/dL 401(U) 89 89  BUN 6 - 20 mg/dL 13 10 27(O)  Creatinine 0.44 - 1.00 mg/dL 5.36 6.44 0.34  Sodium 135 - 145 mmol/L 142 140 139  Potassium 3.5 - 5.1 mmol/L 3.9 3.7 3.8  Chloride 101 - 111 mmol/L 106 106 102  CO2 22 - 32 mmol/L 28 27 31   Calcium 8.9 - 10.3 mg/dL 9.0 8.5 9.7    BNP    Component Value Date/Time   BNP 24.6 08/19/2017 1107    ProBNP    Component Value Date/Time   PROBNP 74.0 07/16/2017 1549    PFT    Component Value Date/Time   FEV1PRE 1.12 08/30/2017 0957   FEV1POST 1.02 08/30/2017 0957   FVCPRE 1.78 08/30/2017 0957   FVCPOST 1.84 08/30/2017 0957   TLC 4.10 08/30/2017 0957   DLCOUNC 15.88 08/30/2017 0957   PREFEV1FVCRT 63 08/30/2017 0957   PSTFEV1FVCRT 55 08/30/2017 0957    Dg Chest 2 View  Result Date: 08/19/2017 CLINICAL DATA:  62 year old female with chest pain, shortness of breath, hypertension. EXAM: CHEST - 2 VIEW COMPARISON:  07/07/2017 and earlier. FINDINGS: Chronically low lung volumes. Mediastinal contours remain normal. Visualized tracheal air column is within normal limits. No pneumothorax, pulmonary edema, pleural effusion or confluent pulmonary opacity. No acute osseous abnormality identified. Negative visible bowel gas pattern. IMPRESSION: Chronic low lung volumes.  No acute cardiopulmonary abnormality. Electronically Signed   By: Odessa Fleming M.D.   On: 08/19/2017 11:38   Ct Angio Chest Pe W And/or Wo Contrast  Result Date: 08/19/2017 CLINICAL DATA:  Chest pain for several days. EXAM: CT ANGIOGRAPHY CHEST WITH CONTRAST TECHNIQUE: Multidetector CT imaging of the  chest was performed using the standard protocol during bolus administration of intravenous contrast. Multiplanar CT image reconstructions and MIPs were obtained to evaluate the vascular anatomy. CONTRAST:  ISOVUE-370 IOPAMIDOL (ISOVUE-370) INJECTION 76% COMPARISON:  None. FINDINGS: Cardiovascular: Satisfactory opacification of the pulmonary arteries to the segmental level. No evidence of pulmonary embolism. Normal heart size. No pericardial effusion. Normal caliber aorta. No thoracic aortic dissection. Mediastinum/Nodes: No enlarged mediastinal, hilar, or axillary lymph nodes. Thyroid gland, trachea, and esophagus demonstrate no significant findings. Lungs/Pleura: Lingular atelectasis. No focal consolidation, pleural effusion or pneumothorax. Upper Abdomen: No acute upper abdominal abnormality. Musculoskeletal: No acute osseous abnormality. No aggressive osseous lesion. Dextrocurvature of the thoracic spine. Review of the MIP images confirms the above findings. IMPRESSION: 1. No evidence of pulmonary embolus. 2. No acute cardiopulmonary disease. Electronically Signed   By: Elige Ko   On: 08/19/2017 13:34     Past medical hx Past Medical History:  Diagnosis Date  . Arthritis   . Chronic neck pain    had previously seen Dr. Salli Real at [facility]   . COPD (chronic obstructive pulmonary disease) (HCC)    sees Dr. Delton Coombes   . Depression   . Fibromyalgia   . Fibromyalgia   . Headache   . Hypertension      Social History   Tobacco Use  . Smoking status: Former Smoker    Packs/day: 0.80    Years: 0.25    Pack years: 0.20    Types: Cigarettes    Last attempt to quit: 03/09/1990    Years since quitting: 27.5  . Smokeless tobacco: Never Used  . Tobacco comment: 1 cig every 2 weeks  Substance Use Topics  . Alcohol use: No    Alcohol/week: 0.0 oz  . Drug use: No    Ms.Chewning reports that she quit smoking about 27 years ago. Her smoking use included cigarettes. She has a 0.20  pack-year smoking history. She has never used smokeless tobacco. She reports that she does not drink alcohol or use drugs.  Tobacco Cessation: I have spent 3 minutes counseling patient on smoking cessation this visit. Smokes 1 cigarette every 2 weeks  Past surgical hx, Family hx, Social hx all reviewed.  Current Outpatient Medications on File Prior to Visit  Medication Sig  . buPROPion (WELLBUTRIN XL) 300 MG 24 hr tablet Take 1 tablet (300 mg total) by mouth daily.  . cholecalciferol (VITAMIN D) 1000 units tablet Take 1,000 Units by mouth daily.  Marland Kitchen estradiol (ESTRACE) 1 MG tablet Take 1 tablet (1 mg total) by mouth daily.  Marland Kitchen Fexofenadine HCl (MUCINEX ALLERGY PO) Take 1 tablet by mouth daily as needed (allergies/congestion.).   Marland Kitchen fluticasone (FLONASE) 50 MCG/ACT nasal spray Place 2 sprays into both nostrils daily.  Marland Kitchen ibuprofen (ADVIL,MOTRIN) 800 MG tablet Take 1 tablet (800 mg total) by mouth every 6 (six) hours as needed for moderate pain.  Marland Kitchen  ketorolac (TORADOL) 10 MG tablet Take 10 mg every 6 hours for 2 days, then every 8 hours for 3 more days. Take with food and antacids.  . lamoTRIgine (LAMICTAL) 25 MG tablet Take 1 tablet (25 mg total) by mouth 2 (two) times daily.  Marland Kitchen levalbuterol (XOPENEX) 0.63 MG/3ML nebulizer solution Take 3 mLs (0.63 mg total) by nebulization every 4 (four) hours as needed for wheezing or shortness of breath.  Marland Kitchen LORazepam (ATIVAN) 2 MG tablet Take 1 tablet (2 mg total) by mouth every 8 (eight) hours as needed for anxiety.  Marland Kitchen losartan-hydrochlorothiazide (HYZAAR) 100-25 MG tablet Take 1 tablet by mouth daily.  . medroxyPROGESTERone (PROVERA) 2.5 MG tablet Take 1 tablet (2.5 mg total) by mouth daily.  . OXYGEN 3lpm with sleep and exertion if needed  . pantoprazole (PROTONIX) 40 MG tablet Take 1 tablet (40 mg total) by mouth daily.  . QUEtiapine (SEROQUEL) 25 MG tablet TAKE 1 TABLET BY MOUTH EVERY 6 HOURS AS NEEDED  . zolpidem (AMBIEN) 10 MG tablet Take 1 tablet (10 mg  total) by mouth at bedtime as needed for sleep.  . ranitidine (ZANTAC) 150 MG capsule Take 1 capsule (150 mg total) by mouth 2 (two) times daily for 10 days.   No current facility-administered medications on file prior to visit.      Allergies  Allergen Reactions  . Codeine Nausea Only    Review Of Systems:  Constitutional:   No  weight loss, night sweats,  Fevers, chills, +fatigue, or + lassitude.  HEENT:   No headaches,  Difficulty swallowing,  Tooth/dental problems, or  Sore throat,                No sneezing, itching, ear ache, nasal congestion, post nasal drip,   CV:  + chest pain,  No Orthopnea, PND, swelling in lower extremities, anasarca, dizziness, palpitations, syncope.   GI  No heartburn, indigestion, abdominal pain, nausea, vomiting, diarrhea, change in bowel habits, loss of appetite, bloody stools.   Resp: + shortness of breath with exertion or at rest.  No  excess mucus, no productive cough,  No non-productive cough,  No coughing up of blood.  No change in color of mucus.  Occasional wheezing.  No chest wall deformity  Skin: no rash or lesions.  GU: no dysuria, change in color of urine, no urgency or frequency.  No flank pain, no hematuria   MS:  No joint pain or swelling.  No decreased range of motion.  + back pain.  Psych:  No change in mood or affect. No depression or anxiety.  No memory loss.   Vital Signs BP (!) 152/100 (BP Location: Right Arm, Cuff Size: Normal)   Pulse (!) 114   Ht 5\' 4"  (1.626 m)   Wt 206 lb 9.6 oz (93.7 kg)   SpO2 98%   BMI 35.46 kg/m    Physical Exam:  General- No distress,  A&Ox3, pleasant and anxious ENT: No sinus tenderness, TM clear, pale nasal mucosa, no oral exudate,no post nasal drip, no LAN Cardiac: S1, S2, regular rate and rhythm, no murmur Chest: No wheeze/ rales/ dullness; no accessory muscle use, no nasal flaring, no sternal retractions Abd.: Soft Non-tender, BS +, ND, Obese Ext: No clubbing cyanosis, Trace BLE  edema Neuro:  Deconditioned at baseline, MAE x 4, A&O x 3 Skin: No rashes, warm and dry Psych: Anxious   Assessment/Plan  COPD (chronic obstructive pulmonary disease) (HCC) Stable interval PFT's confirm severe obstruction with air trapping  and reduced DLCO Plan: We will stop the Symbicort. Resume Trelegy 1 puff once daily Rinse mouth after use. Continue wearing oxygen at 2 L at rest and 3 L with exertion. Remember saturation goal for COPD is 88 to 92% Continue your nebs as needed for shortness of breath or wheezing. Follow up in 6 weeks We will refer to pulmonary rehab once BP is under control. Please contact office for sooner follow up if symptoms do not improve or worsen or seek emergency care    Chronic respiratory failure with hypoxia (HCC) Continue oxygen at 2 L [place] at rest and 3 L with exertion Saturation goals for COPD are 88-92%  Allergic rhinitis Continue Flonase and Protonix    Bevelyn NgoSarah F Sabir Charters, NP 09/01/2017  7:18 PM

## 2017-09-01 ENCOUNTER — Ambulatory Visit: Payer: Medicare HMO | Admitting: Acute Care

## 2017-09-01 ENCOUNTER — Encounter: Payer: Self-pay | Admitting: Acute Care

## 2017-09-01 DIAGNOSIS — J301 Allergic rhinitis due to pollen: Secondary | ICD-10-CM | POA: Diagnosis not present

## 2017-09-01 DIAGNOSIS — J9611 Chronic respiratory failure with hypoxia: Secondary | ICD-10-CM | POA: Diagnosis not present

## 2017-09-01 DIAGNOSIS — J439 Emphysema, unspecified: Secondary | ICD-10-CM | POA: Diagnosis not present

## 2017-09-01 MED ORDER — FLUTICASONE-UMECLIDIN-VILANT 100-62.5-25 MCG/INH IN AEPB: 1.0000 | INHALATION_SPRAY | Freq: Every day | RESPIRATORY_TRACT | 0 refills | Status: DC

## 2017-09-01 MED ORDER — FLUTICASONE-UMECLIDIN-VILANT 100-62.5-25 MCG/INH IN AEPB: 1.0000 | INHALATION_SPRAY | Freq: Every day | RESPIRATORY_TRACT | 3 refills | Status: DC

## 2017-09-01 NOTE — Assessment & Plan Note (Signed)
Continue oxygen at 2 L [place] at rest and 3 L with exertion Saturation goals for COPD are 88-92%

## 2017-09-01 NOTE — Assessment & Plan Note (Signed)
Stable interval PFT's confirm severe obstruction with air trapping and reduced DLCO Plan: We will stop the Symbicort. Resume Trelegy 1 puff once daily Rinse mouth after use. Continue wearing oxygen at 2 L at rest and 3 L with exertion. Remember saturation goal for COPD is 88 to 92% Continue your nebs as needed for shortness of breath or wheezing. Follow up in 6 weeks We will refer to pulmonary rehab once BP is under control. Please contact office for sooner follow up if symptoms do not improve or worsen or seek emergency care

## 2017-09-01 NOTE — Patient Instructions (Addendum)
It is good to see you today We will stop the Symbicort. Resume Trelegy 1 puff once daily Rinse mouth after use. Continue wearing oxygen at 2 L at rest and 3 L with exertion. Remember saturation goal for COPD is 88 to 92% Continue Flonase, Continue Protonix Contibue Losartan per Dr. Abran CantorFrye, and call for follow up appointment and blood pressure check. Continue your albuterol nebs as needed for shortness of breath or wheezing. Follow up in 6 weeks to make sure you are doing well with the Trelegy We will refer to pulmonary rehab once BP is under control. Please contact office for sooner follow up if symptoms do not improve or worsen or seek emergency care

## 2017-09-01 NOTE — Assessment & Plan Note (Signed)
-  Continue Flonase and Protonix

## 2017-09-07 NOTE — Progress Notes (Signed)
ATC, NA and VM not set up

## 2017-09-08 NOTE — Progress Notes (Signed)
LMTCB

## 2017-09-19 NOTE — Progress Notes (Deleted)
History of Present Illness Laurie Schaefer is a 62 y.o. female Former smoker ( Quit [phone]) with withCOPD,vocal cord dysfunctionAndACE related cough.She wears nocturnal oxygen ar 2 L [place]. She is followed by Dr. Delton Coombes.   09/19/2017 Pt. Presents for 4 week follow up. She was last seen in the clinic 08/18/2017 for worsening dyspnea and non-specific chest pain. She was also very hypertensive.  Plan of care after that visit was for CT chest, PFT's and evaluate for etiology of dyspnea  to continue Symbicort daily with EKG showing no acute issues, we recommended ED visit for chest pain that did not resolve. She went to the ED with chest pain 08/19/2017.She was diagnosed with costocondriasis and was treated with pred taper, Indocin and Torodol with instructions not to take it with indocin. CTA done in the ED  showed no PE and no other reason for her dyspnea. She was then seen for hospital follow up by Dr. Clent Ridges  her PCP on 6/17. He adjusted her antihypertensive regimen with plans for a 2-3 week follow up. She states she has been compliant with her treatment for pleurisy and with her new antihypertensive regimen.   Sternal margin tenderness Assessment/Plan  Chronic respiratory failure with hypoxia (HCC) Continue wearing oxygen at 2 L at rest and 3 L with exertion. Remember saturation goal for COPD is 88 to 92%  Allergic rhinitis Continue Flonase, Continue Protonix Plan EKG today We will schedule PFT's today. Continue to use Symbicort 2 puffs twice daily  Rinse mouth after use. CT Chest without contrast>> worsening dyspnea. Referral to cardiology for high blood pressure and high heart rate. Echo Continiue wearing oxygen at 2 L at rest and 3 L with activity Saturation goal is 88-92% Referral to Pulmonary rehab once BP is under control Follow up with Dr.Byrum or Teyah Rossy NP in 2 weeks. Please contact office for sooner follow up if symptoms do not improve or worsen or seek emergency care      Cough variant asthma vs uacs/vcd Continue Symbicort per Dr. Sherene Sires Plan EKG today We will schedule PFT's today. Continue to use Symbicort 2 puffs twice daily  Rinse mouth after use. CT Chest without contrast>> worsening dyspnea. Continiue wearing oxygen at 2 L at rest and 3 L with activity Saturation goal is 88-92% Referral to Pulmonary rehab once BP is under control Follow up with Dr.Byrum or Maurisio Ruddy NP in 2 weeks. Please contact office for sooner follow up if symptoms do not improve or worsen or seek emergency care      Essential hypertension Blood pressure is elevated today in the office Patient states she is taking her antihypertensive medications as prescribed Plan EKG today Referral to cardiology for high blood pressure and high heart rate. Echo Continiue wearing oxygen at 2 L at rest and 3 L with activity Saturation goal is 88-92% Referral to Pulmonary rehab once BP is under control Follow up with Dr.Byrum or Mel Tadros NP in 2 weeks. Please contact office for sooner follow up if symptoms do not improve or worsen or seek emergency care     Nonspecific chest pain Patient states she has nonspecific chest pain at intervals Her husband is trying to get her to go to the emergency room for evaluation but she refuses. Plan Chest x-ray today EKG today Referral to cardiology Advised to seek emergency care, go to the hospital for any worsening in her chest pain or dyspnea  Test Results:  PFT's 08/30/2017 FVC: 1.78 or 56% FEV1: 1.12 or 46% F/F  ratio: 63 or 81% FEF 25-75: 0.63 or 28% DLCO : 15.88 or 67% PFT diagnosis Moderate to severe obstruction, probable restriction, Mild diffusion deficit, abnormal flow volume loop    CTA 08/19/2017 No evidence of pulmonary embolism. Mediastinum/Nodes: No enlarged mediastinal, hilar, or axillary lymph nodes. Thyroid gland, trachea, and esophagus WNL Lungs/Pleura: Lingular atelectasis. No focal consolidation,  pleural effusion or pneumothorax. IMPRESSION: 1. No evidence of pulmonary embolus. 2. No acute cardiopulmonary disease  CXR 6/12 2019 Chronically low lung volumes. Mediastinal contours remain normal. Visualized tracheal air column is within normal limits. No pneumothorax, pulmonary edema, pleural effusion or confluent pulmonary opacity. No acute osseous abnormality identified. Negative visible bowel gas pattern. Chronic low lung volumes.  No acute cardiopulmonary abnormality.    CBC Latest Ref Rng & Units 08/19/2017 07/16/2017 06/29/2017  WBC 4.0 - 10.5 K/uL 6.5 9.1 12.1(H)  Hemoglobin 12.0 - 15.0 g/dL 16.113.1 09.612.6 04.513.8  Hematocrit 36.0 - 46.0 % 38.9 36.5 41.0  Platelets 150 - 400 K/uL 266 328.0 285.0    BMP Latest Ref Rng & Units 08/19/2017 07/16/2017 06/29/2017  Glucose 65 - 99 mg/dL 409(W101(H) 89 89  BUN 6 - 20 mg/dL 13 10 11(B25(H)  Creatinine 0.44 - 1.00 mg/dL 1.470.67 8.290.66 5.620.91  Sodium 135 - 145 mmol/L 142 140 139  Potassium 3.5 - 5.1 mmol/L 3.9 3.7 3.8  Chloride 101 - 111 mmol/L 106 106 102  CO2 22 - 32 mmol/L 28 27 31   Calcium 8.9 - 10.3 mg/dL 9.0 8.5 9.7    BNP    Component Value Date/Time   BNP 24.6 08/19/2017 1107    ProBNP    Component Value Date/Time   PROBNP 74.0 07/16/2017 1549    PFT    Component Value Date/Time   FEV1PRE 1.12 08/30/2017 0957   FEV1POST 1.02 08/30/2017 0957   FVCPRE 1.78 08/30/2017 0957   FVCPOST 1.84 08/30/2017 0957   TLC 4.10 08/30/2017 0957   DLCOUNC 15.88 08/30/2017 0957   PREFEV1FVCRT 63 08/30/2017 0957   PSTFEV1FVCRT 55 08/30/2017 0957    No results found.   Past medical hx Past Medical History:  Diagnosis Date  . Arthritis   . Chronic neck pain    [facility]   . COPD (chronic obstructive pulmonary disease) (HCC)    sees Dr. Delton CoombesByrum   . Depression   . Fibromyalgia   . Fibromyalgia   . Headache   . Hypertension      Social History   Tobacco Use  . Smoking status: Former  Smoker    Packs/day: 0.80    Years: 0.25    Pack years: 0.20    Types: Cigarettes    Last attempt to quit: 03/09/1990    Years since quitting: 27.5  . Smokeless tobacco: Never Used  . Tobacco comment: 1 cig every 2 weeks  Substance Use Topics  . Alcohol use: No    Alcohol/week: 0.0 oz  . Drug use: No    Ms.Picardi reports that she quit smoking about 27 years ago. Her smoking use included cigarettes. She has a 0.20 pack-year smoking history. She has never used smokeless tobacco. She reports that she does not drink alcohol or use drugs.  Tobacco Cessation: Counseling given: Not Answered Comment: 1 cig every 2 weeks   Past surgical hx, Family hx, Social hx all reviewed.  Current Outpatient Medications on File Prior to Visit  Medication Sig  . buPROPion (WELLBUTRIN XL) 300 MG 24 hr tablet Take  1 tablet (300 mg total) by mouth daily.  . cholecalciferol (VITAMIN D) 1000 units tablet Take 1,000 Units by mouth daily.  Marland Kitchen estradiol (ESTRACE) 1 MG tablet Take 1 tablet (1 mg total) by mouth daily.  Marland Kitchen Fexofenadine HCl (MUCINEX ALLERGY PO) Take 1 tablet by mouth daily as needed (allergies/congestion.).   Marland Kitchen fluticasone (FLONASE) 50 MCG/ACT nasal spray Place 2 sprays into both nostrils daily.  . Fluticasone-Umeclidin-Vilant (TRELEGY ELLIPTA) 100-62.5-25 MCG/INH AEPB Inhale 1 puff into the lungs daily.  Marland Kitchen ibuprofen (ADVIL,MOTRIN) 800 MG tablet Take 1 tablet (800 mg total) by mouth every 6 (six) hours as needed for moderate pain.  Marland Kitchen ketorolac (TORADOL) 10 MG tablet Take 10 mg every 6 hours for 2 days, then every 8 hours for 3 more days. Take with food and antacids.  . lamoTRIgine (LAMICTAL) 25 MG tablet Take 1 tablet (25 mg total) by mouth 2 (two) times daily.  Marland Kitchen levalbuterol (XOPENEX) 0.63 MG/3ML nebulizer solution Take 3 mLs (0.63 mg total) by nebulization every 4 (four) hours as needed for wheezing or shortness of breath.  Marland Kitchen LORazepam (ATIVAN) 2 MG tablet Take 1 tablet (2 mg total) by mouth every 8  (eight) hours as needed for anxiety.  Marland Kitchen losartan-hydrochlorothiazide (HYZAAR) 100-25 MG tablet Take 1 tablet by mouth daily.  . medroxyPROGESTERone (PROVERA) 2.5 MG tablet Take 1 tablet (2.5 mg total) by mouth daily.  . OXYGEN 3lpm with sleep and exertion if needed  . pantoprazole (PROTONIX) 40 MG tablet Take 1 tablet (40 mg total) by mouth daily.  . QUEtiapine (SEROQUEL) 25 MG tablet TAKE 1 TABLET BY MOUTH EVERY 6 HOURS AS NEEDED  . ranitidine (ZANTAC) 150 MG capsule Take 1 capsule (150 mg total) by mouth 2 (two) times daily for 10 days.  Marland Kitchen zolpidem (AMBIEN) 10 MG tablet Take 1 tablet (10 mg total) by mouth at bedtime as needed for sleep.   No current facility-administered medications on file prior to visit.      Allergies  Allergen Reactions  . Codeine Nausea Only    Review Of Systems:  Constitutional:   No  weight loss, night sweats,  Fevers, chills, fatigue, or  lassitude.  HEENT:   No headaches,  Difficulty swallowing,  Tooth/dental problems, or  Sore throat,                No sneezing, itching, ear ache, nasal congestion, post nasal drip,   CV:  No chest pain,  Orthopnea, PND, swelling in lower extremities, anasarca, dizziness, palpitations, syncope.   GI  No heartburn, indigestion, abdominal pain, nausea, vomiting, diarrhea, change in bowel habits, loss of appetite, bloody stools.   Resp: No shortness of breath with exertion or at rest.  No excess mucus, no productive cough,  No non-productive cough,  No coughing up of blood.  No change in color of mucus.  No wheezing.  No chest wall deformity  Skin: no rash or lesions.  GU: no dysuria, change in color of urine, no urgency or frequency.  No flank pain, no hematuria   MS:  No joint pain or swelling.  No decreased range of motion.  No back pain.  Psych:  No change in mood or affect. No depression or anxiety.  No memory loss.   Vital Signs There were no vitals taken for this visit.   Physical Exam:  General- No  distress,  A&Ox3 ENT: No sinus tenderness, TM clear, pale nasal mucosa, no oral exudate,no post nasal drip, no LAN Cardiac: S1, S2,  regular rate and rhythm, no murmur Chest: No wheeze/ rales/ dullness; no accessory muscle use, no nasal flaring, no sternal retractions Abd.: Soft Non-tender Ext: No clubbing cyanosis, edema Neuro:  normal strength Skin: No rashes, warm and dry Psych: normal mood and behavior   Assessment/Plan  No problem-specific Assessment & Plan notes found for this encounter.    Bevelyn Ngo, NP 09/19/2017  11:15 AM

## 2017-09-20 ENCOUNTER — Ambulatory Visit: Payer: Medicare HMO | Admitting: Acute Care

## 2017-09-22 ENCOUNTER — Encounter: Payer: Self-pay | Admitting: Acute Care

## 2017-09-22 ENCOUNTER — Ambulatory Visit: Payer: Medicare HMO | Admitting: Acute Care

## 2017-09-22 VITALS — BP 152/90 | HR 113 | Ht 64.0 in | Wt 212.0 lb

## 2017-09-22 DIAGNOSIS — J9611 Chronic respiratory failure with hypoxia: Secondary | ICD-10-CM | POA: Diagnosis not present

## 2017-09-22 DIAGNOSIS — F319 Bipolar disorder, unspecified: Secondary | ICD-10-CM

## 2017-09-22 DIAGNOSIS — J439 Emphysema, unspecified: Secondary | ICD-10-CM

## 2017-09-22 DIAGNOSIS — R942 Abnormal results of pulmonary function studies: Secondary | ICD-10-CM | POA: Diagnosis not present

## 2017-09-22 MED ORDER — BUDESONIDE-FORMOTEROL FUMARATE 160-4.5 MCG/ACT IN AERO: 2.0000 | INHALATION_SPRAY | Freq: Two times a day (BID) | RESPIRATORY_TRACT | 0 refills | Status: DC

## 2017-09-22 MED ORDER — TIOTROPIUM BROMIDE MONOHYDRATE 2.5 MCG/ACT IN AERS: 2.0000 | INHALATION_SPRAY | Freq: Every day | RESPIRATORY_TRACT | 0 refills | Status: DC

## 2017-09-22 MED ORDER — BUDESONIDE-FORMOTEROL FUMARATE 160-4.5 MCG/ACT IN AERO: 2.0000 | INHALATION_SPRAY | Freq: Two times a day (BID) | RESPIRATORY_TRACT | 5 refills | Status: DC

## 2017-09-22 MED ORDER — TIOTROPIUM BROMIDE MONOHYDRATE 2.5 MCG/ACT IN AERS: 2.0000 | INHALATION_SPRAY | Freq: Every day | RESPIRATORY_TRACT | 5 refills | Status: DC

## 2017-09-22 NOTE — Assessment & Plan Note (Signed)
Continued intermittent dyspnea Personality change and sore throat on Trelegy ( She did not notify the office) Plan Ask Dr. Clent RidgesFry to refer you for psych consult. Ask Dr. Clent RidgesFry to assess need for home health care assistance Please call Dr. Clent RidgesFry about continued elevation in blood pressure Stop Trelegy Resume Symbicort 2 puffs twice daily Resume Spiriva 2 puffs once daily  We will renew both prescriptions Rinse mouth after use We will refer you to ENT for evaluation of your vocal cords. Try walking in the mall or at [employer] where you can enjoy the air conditioning. Wear your oxygen at 2 L  Saturation goals are 88-92% Follow up in 8 weeks  Please contact office for sooner follow up if symptoms do not improve or worsen or seek emergency care

## 2017-09-22 NOTE — Patient Instructions (Addendum)
It is good to see you. Ask Dr. Clent RidgesFry to refer you for psych consult. Ask Dr. Clent RidgesFry to assess need for home health care assistance Please call Dr. Clent RidgesFry about continued elevation in blood pressure Stop Trelegy Resume Symbicort 2 puffs twice daily Resume Spiriva 2 puffs once daily  We will renew both prescriptions Rinse mouth after use We will refer you to ENT for evaluation of your vocal cords. Try walking in the mall or at [employer] where you can enjoy the air conditioning. Wear your oxygen at 2 l [place] Saturation goals are 88-92% Follow up in 8 weeks  Please contact office for sooner follow up if symptoms do not improve or worsen or seek emergency care

## 2017-09-22 NOTE — Progress Notes (Signed)
History of Present Illness Laurie Schaefer is a 62 y.o. female former smoker ( Quit 20) withCOPD,vocal cord dysfunctionAndACE related cough, and, Bi Polar disease and  fibromyalgia. She wears nocturnal oxygen ar 2 L [place] and as needed.She is on chronic Norco for pain management ( Managed by Dr. Clent Ridges). She is followed by Dr. Delton Coombes.  09/22/2017  Follow up: Pt. Presents for follow up.She was last sen 09/01/2017. She was started on Trelegy which she states  has helped her breathing but she states it gives her  the  shakes and a sore throat. Per both the patient and  her husband, she has had an emotional/ personality  change that she calls roid rage. Her husband states that these  rages correlated with starting  on the Trelegy..She is using her albuterol nebs about 3-4 times a week.She has been compliant with her Trelegy every day despite  The above noted adverse reactions. She did not call the office and let us know these ADR's were occurring because she felt she could breath better on the Trelegy.She has numerous other complaints. PFT's were reviewed. She is complaining of being unable to walk as far in the heat. She had an ED visit   for chest pain6/13/2019 which was determined to be pleuritic. She was treated with indocine. She continues to complain to intercostal tenderness. She endorses a cough. She denies fever, chest pain,othropnea of hemoptysis.On oxygen at 2 L saturations are 97%. Blood pressures remain elevated despite Dr. Clent Ridges increasing her antihypertensive medications. She denies fever, chest pain, orthopnea or hemoptysis.  Test Results: Echo 08/2017>>  Left ventricle: The cavity size was normal. Wall thickness was   increased in a pattern of moderate LVH. Systolic function was   vigorous. The estimated ejection fraction was in the range of 65%   to 70%. Wall motion was normal; there were no regional wall   motion abnormalities. Doppler parameters are consistent with   abnormal left  ventricular relaxation (grade 1 diastolic   dysfunction).  CTA 08/19/2017 FINDINGS: Cardiovascular: Satisfactory opacification of the pulmonary arteries to the segmental level. No evidence of pulmonary embolism. Normal heart size. No pericardial effusion. Normal caliber aorta. No thoracic aortic dissection. Mediastinum/Nodes: No enlarged mediastinal, hilar, or axillary lymph nodes. Thyroid gland, trachea, and esophagus demonstrate no significant findings. Lungs/Pleura: Lingular atelectasis. No focal consolidation, pleural effusion or pneumothorax. Upper Abdomen: No acute upper abdominal abnormality. Musculoskeletal: No acute osseous abnormality. No aggressive osseous lesion. Dextrocurvature of the thoracic spine.  No evidence of pulmonary embolus. 2. No acute cardiopulmonary disease.  PFT's  08/30/2017 FVC  1.78: 56% predicted FEV1: 1.12, 46% predicted FF ratio: 63%, 81% percent predicted FEF 25 to 75%: 0.63, 28% predicted TLC: 4.10, 82% predicted DLCO: 15.88, 67% predicted VA: 3.89, 78% predicted  The FVC, FEV1, FEV1/FVC ratio and FEF25-75% are reduced indicating airway obstruction. It is impossible to adequately evaluate the flow volume loop due to excessive variablity such as might be produced by coughing. No significant bronchodilator response,  the reduced diffusing capacity indicates a mild loss of functional alveolar capillary surface. Pulmonary Function Diagnosis: Moderately severe Obstructive Airways Disease Restriction -Probable Mild Diffusion Defect abnormal f/v loop   CBC Latest Ref Rng & Units 08/19/2017 07/16/2017 06/29/2017  WBC 4.0 - 10.5 K/uL 6.5 9.1 12.1(H)  Hemoglobin 12.0 - 15.0 g/dL 40.9 81.1 91.4  Hematocrit 36.0 - 46.0 % 38.9 36.5 41.0  Platelets 150 - 400 K/uL 266 328.0 285.0    BMP Latest Ref Rng & Units  08/19/2017 07/16/2017 06/29/2017  Glucose 65 - 99 mg/dL 161(W) 89 89  BUN 6 - 20 mg/dL 13 10 96(E)  Creatinine 0.44 - 1.00 mg/dL 4.54 0.98 1.19    Sodium 135 - 145 mmol/L 142 140 139  Potassium 3.5 - 5.1 mmol/L 3.9 3.7 3.8  Chloride 101 - 111 mmol/L 106 106 102  CO2 22 - 32 mmol/L 28 27 31   Calcium 8.9 - 10.3 mg/dL 9.0 8.5 9.7    BNP    Component Value Date/Time   BNP 24.6 08/19/2017 1107    ProBNP    Component Value Date/Time   PROBNP 74.0 07/16/2017 1549    PFT    Component Value Date/Time   FEV1PRE 1.12 08/30/2017 0957   FEV1POST 1.02 08/30/2017 0957   FVCPRE 1.78 08/30/2017 0957   FVCPOST 1.84 08/30/2017 0957   TLC 4.10 08/30/2017 0957   DLCOUNC 15.88 08/30/2017 0957   PREFEV1FVCRT 63 08/30/2017 0957   PSTFEV1FVCRT 55 08/30/2017 0957    No results found.   Past medical hx Past Medical History:  Diagnosis Date  . Arthritis   . Chronic neck pain    had previously seen Dr. Salli Real [facility]   . COPD (chronic obstructive pulmonary disease) (HCC)    sees Dr. Delton Coombes   . Depression   . Fibromyalgia   . Fibromyalgia   . Headache   . Hypertension      Social History   Tobacco Use  . Smoking status: Former Smoker    Packs/day: 0.80    Years: 0.25    Pack years: 0.20    Types: Cigarettes    Last attempt to quit: 03/09/1990    Years since quitting: 27.5  . Smokeless tobacco: Never Used  . Tobacco comment: 1 cig every 2 weeks  Substance Use Topics  . Alcohol use: No    Alcohol/week: 0.0 oz  . Drug use: No    [name] reports that she quit smoking about 27 years ago. Her smoking use included cigarettes. She has a 0.20 pack-year smoking history. She has never used smokeless tobacco. She reports that she does not drink alcohol or use drugs.  Tobacco Cessation: Former smoker, quit 1992  Past surgical hx, Family hx, Social hx all reviewed.  Current Outpatient Medications on File Prior to Visit  Medication Sig  . buPROPion (WELLBUTRIN XL) 300 MG 24 hr tablet Take 1 tablet (300 mg total) by mouth daily.  . cholecalciferol (VITAMIN D) 1000 units tablet Take 1,000 Units by mouth daily.   Marland Kitchen estradiol (ESTRACE) 1 MG tablet Take 1 tablet (1 mg total) by mouth daily.  Marland Kitchen Fexofenadine HCl (MUCINEX ALLERGY PO) Take 1 tablet by mouth daily as needed (allergies/congestion.).   Marland Kitchen fluticasone (FLONASE) 50 MCG/ACT nasal spray Place 2 sprays into both nostrils daily.  Marland Kitchen ibuprofen (ADVIL,MOTRIN) 800 MG tablet Take 1 tablet (800 mg total) by mouth every 6 (six) hours as needed for moderate pain.  Marland Kitchen ketorolac (TORADOL) 10 MG tablet Take 10 mg every 6 hours for 2 days, then every 8 hours for 3 more days. Take with food and antacids.  . lamoTRIgine (LAMICTAL) 25 MG tablet Take 1 tablet (25 mg total) by mouth 2 (two) times daily.  Marland Kitchen levalbuterol (XOPENEX) 0.63 MG/3ML nebulizer solution Take 3 mLs (0.63 mg total) by nebulization every 4 (four) hours as needed for wheezing or shortness of breath.  Marland Kitchen LORazepam (ATIVAN) 2 MG tablet Take 1 tablet (2 mg total) by mouth every 8 (eight)  hours as needed for anxiety.  Marland Kitchen. losartan-hydrochlorothiazide (HYZAAR) 100-25 MG tablet Take 1 tablet by mouth daily.  . medroxyPROGESTERone (PROVERA) 2.5 MG tablet Take 1 tablet (2.5 mg total) by mouth daily.  . OXYGEN 3lpm with sleep and exertion if needed  . pantoprazole (PROTONIX) 40 MG tablet Take 1 tablet (40 mg total) by mouth daily.  . QUEtiapine (SEROQUEL) 25 MG tablet TAKE 1 TABLET BY MOUTH EVERY 6 HOURS AS NEEDED  . zolpidem (AMBIEN) 10 MG tablet Take 1 tablet (10 mg total) by mouth at bedtime as needed for sleep.  . ranitidine (ZANTAC) 150 MG capsule Take 1 capsule (150 mg total) by mouth 2 (two) times daily for 10 days.   No current facility-administered medications on file prior to visit.      Allergies  Allergen Reactions  . Codeine Nausea Only    Review Of Systems:  Constitutional:   No  weight loss, night sweats,  Fevers, chills, + fatigue, or  lassitude.  HEENT:   No headaches,  Difficulty swallowing,  Tooth/dental problems, or  Sore throat,                No sneezing, itching, ear ache, nasal  congestion, post nasal drip,   CV:  + chest pain,  Orthopnea, PND, swelling in lower extremities, anasarca, dizziness, palpitations, syncope.   GI  No heartburn, indigestion, abdominal pain, nausea, vomiting, diarrhea, change in bowel habits, loss of appetite, bloody stools.   Resp: + shortness of breath with exertion or at rest.  No excess mucus, no productive cough,  + non-productive cough,  No coughing up of blood.  No change in color of mucus.  + wheezing.  No chest wall deformity  Skin: no rash or lesions.  GU: no dysuria, change in color of urine, no urgency or frequency.  No flank pain, no hematuria   MS:  No joint pain or swelling.  No decreased range of motion.  No back pain.  Psych:  + change in mood or affect. + depression or anxiety.  No memory loss.   Vital Signs BP (!) 152/90 (BP Location: Right Arm, Cuff Size: Normal)   Pulse (!) 113   Ht 5\' 4"  (1.626 m)   Wt 212 lb (96.2 kg)   SpO2 97%   BMI 36.39 kg/m    Physical Exam:  General- No distress,  A&Ox3, pleasant ENT: No sinus tenderness, TM clear, pale nasal mucosa, no oral exudate,no post nasal drip, no LAN Cardiac: S1, S2, regular rate and rhythm, no murmur Chest: No wheeze/ rales/ dullness; no accessory muscle use, no nasal flaring, no sternal retractions, diminished per bases Abd.: Soft Non-tender, ND, BS +, Body mass index is 36.39 kg/m. Ext: No clubbing cyanosis, edema Neuro:  Deconditioned at baseline, MAE x 4, A&O x 3 Skin: No rashes, warm and dry Psych: Anxious   Assessment/Plan  COPD (chronic obstructive pulmonary disease) (HCC) Continued intermittent dyspnea Personality change and sore throat on Trelegy ( She did not notify the office) Plan Ask Dr. Clent RidgesFry to refer you for psych consult. Ask Dr. Clent RidgesFry to assess need for home health care assistance Please call Dr. Clent RidgesFry about continued elevation in blood pressure Stop Trelegy Resume Symbicort 2 puffs twice daily Resume Spiriva 2 puffs once daily  We  will renew both prescriptions Rinse mouth after use We will refer you to ENT for evaluation of your vocal cords. Try walking in the mall or at [employer] where you can enjoy the air conditioning.  Wear your oxygen at 2 L [place] Saturation goals are 88-92% Follow up in 8 weeks  Please contact office for sooner follow up if symptoms do not improve or worsen or seek emergency care    Chronic respiratory failure with hypoxia (HCC) Wear oxygen at 2 L [place] at night and as needed during the day Saturation goal is 88-92% Turn oxygen down instead of taking it off when able  Bipolar I disorder [facility]) Referral to psychiatrist per her request Dr. Delton Coombes did this last year, but she did not establish with an MD at [facility], NP 09/22/2017  7:54 PM

## 2017-09-22 NOTE — Assessment & Plan Note (Signed)
Wear oxygen at 2 L [place] at night and as needed during the day Saturation goal is 88-92% Turn oxygen down instead of taking it off when able

## 2017-09-22 NOTE — Assessment & Plan Note (Signed)
Referral to psychiatrist per her request Dr. Delton CoombesByrum did this last year, but she did not [facility]

## 2017-09-24 ENCOUNTER — Telehealth: Payer: Self-pay | Admitting: Family Medicine

## 2017-09-24 MED ORDER — HYDROCODONE-ACETAMINOPHEN 10-325 MG PO TABS: 1.0000 | ORAL_TABLET | Freq: Four times a day (QID) | ORAL | 0 refills | Status: AC | PRN

## 2017-09-24 NOTE — Telephone Encounter (Signed)
Called the pharmacy they stated that the last time the pt picked this Rx up was late May no Rx on file.   Sent to PCP to advise.

## 2017-09-24 NOTE — Telephone Encounter (Signed)
Copied from CRM [phone]. Topic: General - Other >> Sep 24, 2017  9:10 AM Leafy Roobinson, Norma J wrote: Reason for CRM: pt is calling and needs hydrocodone. Pt went to pharm and was told to call her md. [facility] [address] [place]

## 2017-09-24 NOTE — Telephone Encounter (Signed)
Called pt and left a VM

## 2017-09-24 NOTE — Telephone Encounter (Signed)
Done

## 2017-09-24 NOTE — Telephone Encounter (Signed)
Last prescribed 07/28/17 by Dr Clent RidgesFry Last OV 08/23/17 Please advise

## 2017-10-04 ENCOUNTER — Ambulatory Visit: Payer: Medicare HMO | Admitting: Family Medicine

## 2017-11-05 ENCOUNTER — Other Ambulatory Visit: Payer: Self-pay | Admitting: Family Medicine

## 2017-11-05 NOTE — Telephone Encounter (Signed)
Last OV 08/23/2017   ambien last refilled 04/09/2017 disp 90 with 1 refill    ibuprofen 800 MG not on pt's current medication list

## 2017-11-10 NOTE — Telephone Encounter (Signed)
Rx was called into pt's pharmacy. 

## 2017-11-10 NOTE — Telephone Encounter (Signed)
Call in Zolpidem #90 with one rf and Ibuprofen #120 with 5 rf

## 2017-11-24 ENCOUNTER — Ambulatory Visit (INDEPENDENT_AMBULATORY_CARE_PROVIDER_SITE_OTHER)
Admission: RE | Admit: 2017-11-24 | Discharge: 2017-11-24 | Disposition: A | Discharge status: Home / Self Care | Payer: Medicare HMO | Source: Ambulatory Visit | Attending: Acute Care | Admitting: Acute Care

## 2017-11-24 ENCOUNTER — Ambulatory Visit: Payer: Medicare HMO | Admitting: Acute Care

## 2017-11-24 VITALS — BP 120/82 | HR 110 | Temp 97.9°F | Ht 64.0 in | Wt 211.0 lb

## 2017-11-24 DIAGNOSIS — J439 Emphysema, unspecified: Secondary | ICD-10-CM | POA: Diagnosis not present

## 2017-11-24 DIAGNOSIS — J9611 Chronic respiratory failure with hypoxia: Secondary | ICD-10-CM | POA: Diagnosis not present

## 2017-11-24 DIAGNOSIS — F320 Major depressive disorder, single episode, mild: Secondary | ICD-10-CM

## 2017-11-24 DIAGNOSIS — J9811 Atelectasis: Secondary | ICD-10-CM

## 2017-11-24 MED ORDER — DOXYCYCLINE HYCLATE 100 MG PO TABS: 100.0000 mg | ORAL_TABLET | Freq: Two times a day (BID) | ORAL | 0 refills | Status: DC

## 2017-11-24 MED ORDER — TIOTROPIUM BROMIDE MONOHYDRATE 2.5 MCG/ACT IN AERS: 2.0000 | INHALATION_SPRAY | Freq: Every day | RESPIRATORY_TRACT | 0 refills | Status: DC

## 2017-11-24 MED ORDER — PREDNISONE 10 MG PO TABS: ORAL_TABLET | ORAL | 0 refills | Status: DC

## 2017-11-24 NOTE — Patient Instructions (Addendum)
It is good to see you today CXR today. We will call you with the results. Continue the Symbicort 2 puffs twice daily Restart Spiriva 2 puffs once  daily Rinse mouth after use Nebulizer treatments  Prednisone taper; 10 mg tablets: 4 tabs x 2 days, 3 tabs x 2 days, 2 tabs x 2 days 1 tab x 2 days then stop. Doxycycline 100 mg twice daily x 7 days. Take probiotic with antibiotiic Continue oxygen at 3 L [place] Follow up with Dr. Clent RidgesFry for your continued depression Follow up with Torry Adamczak in 2 weeks to make sure you are better. Note your daily symptoms > remember "red flags" for COPD:  Increase in cough, increase in sputum production, increase in shortness of breath or activity intolerance. If you notice these symptoms, please call to be seen.   Consider HRCT when better to evaluate other possible reasons for COPD in a non-smoker

## 2017-11-24 NOTE — Progress Notes (Signed)
History of Present Illness Laurie Schaefer is a 62 y.o. female   remote former smoker ( Quit [id number]) withCOPD,vocal cord dysfunctionAndACE related cough, and, Bi Polar disease and  fibromyalgia. She wears nocturnal oxygen ar 2 L [place] and as needed.She is on chronic Norco for pain management ( Managed by Dr. Clent RidgesFry). She is followed by Dr. Delton CoombesByrum.    11/25/2017  Acute OV for chest congestion, shortness of breath, weakness and low oxygen. Her oxygen sat is 96% on 3 L.  Pt. Presents stating  she has been compliant with her Symbicort but states she has not been taking her Spiriva. Spiriva. She states she has had the described symptoms x 2 days. She has had a sore throat 2 days. She has not been able to sleep. She states she feels weak and she has some difficulty breathing and swallowing.She [place] and she is afraid that may have been exposed to germs.She has noticed her endurance is poor.She denies fever, chest pain, orthopnea or hemoptysis. She states she has no new leg pain. No recent air travel or prolonged auto travel. She states she has chronic leg and arm cramps.She continues to ask about depression medications. She is taking Wellbutrin, but feels she needs something more.  I have referred her to psychology in the past and she did not follow up. I have asked her to follow up with her PCP who has been managing her depression.  Test Results: CXR 11/24/2017:>> personally reviewed by me No active cardiopulmonary disease. Streaky atelectasis at the bases  CBC Latest Ref Rng & Units 08/19/2017 07/16/2017 06/29/2017  WBC 4.0 - 10.5 K/uL 6.5 9.1 12.1(H)  Hemoglobin 12.0 - 15.0 g/dL 04.513.1 40.912.6 81.113.8  Hematocrit 36.0 - 46.0 % 38.9 36.5 41.0  Platelets 150 - 400 K/uL 266 328.0 285.0    BMP Latest Ref Rng & Units 08/19/2017 07/16/2017 06/29/2017  Glucose 65 - 99 mg/dL 914(N101(H) 89 89  BUN 6 - 20 mg/dL 13 10 82(N25(H)  Creatinine 0.44 - 1.00 mg/dL 5.620.67 1.300.66 8.650.91  Sodium 135  - 145 mmol/L 142 140 139  Potassium 3.5 - 5.1 mmol/L 3.9 3.7 3.8  Chloride 101 - 111 mmol/L 106 106 102  CO2 22 - 32 mmol/L 28 27 31   Calcium 8.9 - 10.3 mg/dL 9.0 8.5 9.7    BNP    Component Value Date/Time   BNP 24.6 08/19/2017 1107    ProBNP    Component Value Date/Time   PROBNP 74.0 07/16/2017 1549    PFT    Component Value Date/Time   FEV1PRE 1.12 08/30/2017 0957   FEV1POST 1.02 08/30/2017 0957   FVCPRE 1.78 08/30/2017 0957   FVCPOST 1.84 08/30/2017 0957   TLC 4.10 08/30/2017 0957   DLCOUNC 15.88 08/30/2017 0957   PREFEV1FVCRT 63 08/30/2017 0957   PSTFEV1FVCRT 55 08/30/2017 0957    Dg Chest 2 View  Result Date: 11/24/2017 CLINICAL DATA:  COPD, chest tightness EXAM: CHEST - 2 VIEW COMPARISON:  CT 08/19/2017, radiograph 08/19/2017 FINDINGS: Streaky bibasilar atelectasis. No focal consolidation or pleural effusion. Cardiomediastinal silhouette within normal limits. No pneumothorax. IMPRESSION: No active cardiopulmonary disease. Streaky atelectasis at the bases. Electronically Signed   By: Jasmine PangKim  Fujinaga M.D.   On: 11/24/2017 17:41     Past medical hx Past Medical History:  Diagnosis Date  . Arthritis   . Chronic neck pain    had [facility]un [facility]   .  COPD (chronic obstructive pulmonary disease) (HCC)    sees Dr. Delton Coombes   . Depression   . Fibromyalgia   . Fibromyalgia   . Headache   . Hypertension      Social History   Tobacco Use  . Smoking status: Former Smoker    Packs/day: 0.80    Years: 0.25    Pack years: 0.20    Types: Cigarettes    Last attempt to quit: 03/09/1990    Years since quitting: 27.7  . Smokeless tobacco: Never Used  . Tobacco comment: 1 cig every 2 weeks  Substance Use Topics  . Alcohol use: No    Alcohol/week: 0.0 standard drinks  . Drug use: No    [name] reports that she quit smoking about 27 years ago. Her smoking use included cigarettes. She has a 0.20 pack-year smoking history. She has  never used smokeless tobacco. She reports that she does not drink alcohol or use drugs.  Tobacco Cessation: Former smoker, quit 1992 Past surgical hx, Family hx, Social hx all reviewed.  Current Outpatient Medications on File Prior to Visit  Medication Sig  . budesonide-formoterol (SYMBICORT) 160-4.5 MCG/ACT inhaler Inhale 2 puffs into the lungs 2 (two) times daily.  Marland Kitchen buPROPion (WELLBUTRIN XL) 300 MG 24 hr tablet Take 1 tablet (300 mg total) by mouth daily.  . cholecalciferol (VITAMIN D) 1000 units tablet Take 1,000 Units by mouth daily.  Marland Kitchen estradiol (ESTRACE) 1 MG tablet Take 1 tablet (1 mg total) by mouth daily.  Marland Kitchen Fexofenadine HCl (MUCINEX ALLERGY PO) Take 1 tablet by mouth daily as needed (allergies/congestion.).   Marland Kitchen fluticasone (FLONASE) 50 MCG/ACT nasal spray Place 2 sprays into both nostrils daily.  Marland Kitchen ibuprofen (ADVIL,MOTRIN) 800 MG tablet TAKE 1 TABLET BY MOUTH EVERY 6 HOURS AS NEEDED FOR PAIN  . ketorolac (TORADOL) 10 MG tablet Take 10 mg every 6 hours for 2 days, then every 8 hours for 3 more days. Take with food and antacids.  . lamoTRIgine (LAMICTAL) 25 MG tablet Take 1 tablet (25 mg total) by mouth 2 (two) times daily.  Marland Kitchen levalbuterol (XOPENEX) 0.63 MG/3ML nebulizer solution Take 3 mLs (0.63 mg total) by nebulization every 4 (four) hours as needed for wheezing or shortness of breath.  Marland Kitchen LORazepam (ATIVAN) 2 MG tablet Take 1 tablet (2 mg total) by mouth every 8 (eight) hours as needed for anxiety.  Marland Kitchen losartan-hydrochlorothiazide (HYZAAR) 100-25 MG tablet Take 1 tablet by mouth daily.  . medroxyPROGESTERone (PROVERA) 2.5 MG tablet Take 1 tablet (2.5 mg total) by mouth daily.  . OXYGEN 3lpm with sleep and exertion if needed  . pantoprazole (PROTONIX) 40 MG tablet Take 1 tablet (40 mg total) by mouth daily.  . QUEtiapine (SEROQUEL) 25 MG tablet TAKE 1 TABLET BY MOUTH EVERY 6 HOURS AS NEEDED  . zolpidem (AMBIEN) 10 MG tablet TAKE 1 TABLET BY MOUTH EVERY NIGHT AT BEDTIME AS NEEDED  SLEEP  . ranitidine (ZANTAC) 150 MG capsule Take 1 capsule (150 mg total) by mouth 2 (two) times daily for 10 days.   No current facility-administered medications on file prior to visit.      Allergies  Allergen Reactions  . Codeine Nausea Only    Review Of Systems:  Constitutional:   No  weight loss, night sweats,  Fevers, chills,+ fatigue, or  lassitude.  HEENT:   No headaches,  Difficulty swallowing,  Tooth/dental problems, or  Sore throat,  No sneezing, itching, ear ache, nasal congestion, post nasal drip,   CV:  No chest pain,  Orthopnea, PND, swelling in lower extremities, anasarca, dizziness, palpitations, syncope.   GI  No heartburn, indigestion, abdominal pain, nausea, vomiting, diarrhea, change in bowel habits, loss of appetite, bloody stools.   Resp: + shortness of breath with exertion or at rest.  No excess mucus, no productive cough,  No non-productive cough,  No coughing up of blood.  No change in color of mucus.  + wheezing.  No chest wall deformity  Skin: no rash or lesions.  GU: no dysuria, change in color of urine, no urgency or frequency.  No flank pain, no hematuria   MS:  No joint pain or swelling.  No decreased range of motion.  No back pain.  Psych:  No change in mood or affect. No depression or anxiety.  No memory loss.   Vital Signs BP 120/82 (BP Location: Left Arm, Cuff Size: Normal)   Pulse (!) 110   Temp 97.9 F (36.6 C) (Oral)   Ht 5\' 4"  (1.626 m)   Wt 211 lb (95.7 kg)   SpO2 96%   BMI 36.22 kg/m    Physical Exam:  General- No distress,  A&Ox3, pleasant ENT: No sinus tenderness, TM clear, pale nasal mucosa, no oral exudate,no post nasal drip, no LAN Cardiac: S1, S2, regular rate and rhythm, no murmur Chest: No wheeze/ rales/ dullness; no accessory muscle use, no nasal flaring, no sternal retractions, diminished per bases bilaterally Abd.: Soft Non-tender, ND, BS +, Body mass index is 36.22 kg/m. Ext: No clubbing cyanosis,  edema Neuro:  normal strength, MAE x 4, A&O x 3, unfocused Skin: No rashes, warm and dry Psych: normal mood and behavior, anxious   Assessment/Plan  COPD (chronic obstructive pulmonary disease) (HCC) Flare Plan: CXR today. We will call you with the results. Continue the Symbicort 2 puffs twice daily Restart Spiriva 2 puffs once  daily Rinse mouth after use Nebulizer treatments  Prednisone taper; 10 mg tablets: 4 tabs x 2 days, 3 tabs x 2 days, 2 tabs x 2 days 1 tab x 2 days then stop. Doxycycline 100 mg twice daily x 7 days. Take probiotic with antibiotiic Continue oxygen at 3 L [place] Saturation goals are 88-92% Follow up with Dr. Clent Ridges for your continued depression Follow up with Sarah in 2 weeks to make sure you are better. Note your daily symptoms > remember "red flags" for COPD:  Increase in cough, increase in sputum production, increase in shortness of breath or activity intolerance. If you notice these symptoms, please call to be seen.   Please contact office for sooner follow up if symptoms do not improve or worsen or seek emergency care  Consider HRCT when better to evaluate other possible reasons for COPD in a non-smoker   Chronic respiratory failure with hypoxia (HCC) Continue wearing oxygen to maintain oxygen saturations 88-92%  Depression Continues to complain of depression Wellbutrin is being managed by Dr. Clent Ridges Previous referral to psych, but patient never followed up Not suicidal per both patient and her husband Plan Follow up with Dr. Clent Ridges as he has been managing her depression treatment  Atelectasis Basilar atelectasis per CXR Plan: IS Q1 while awake    Bevelyn Ngo, NP 11/25/2017  8:37 AM

## 2017-11-25 ENCOUNTER — Encounter: Payer: Self-pay | Admitting: Acute Care

## 2017-11-25 DIAGNOSIS — J9811 Atelectasis: Secondary | ICD-10-CM | POA: Insufficient documentation

## 2017-11-25 NOTE — Assessment & Plan Note (Signed)
Flare Plan: CXR today. We will call you with the results. Continue the Symbicort 2 puffs twice daily Restart Spiriva 2 puffs once  daily Rinse mouth after use Nebulizer treatments  Prednisone taper; 10 mg tablets: 4 tabs x 2 days, 3 tabs x 2 days, 2 tabs x 2 days 1 tab x 2 days then stop. Doxycycline 100 mg twice daily x 7 days. Take probiotic with antibiotiic Continue oxygen at 3 L [place] Saturation goals are 88-92% Follow up with Dr. Clent RidgesFry for your continued depression Follow up with Sarah in 2 weeks to make sure you are better. Note your daily symptoms > remember "red flags" for COPD:  Increase in cough, increase in sputum production, increase in shortness of breath or activity intolerance. If you notice these symptoms, please call to be seen.   Please contact office for sooner follow up if symptoms do not improve or worsen or seek emergency care  Consider HRCT when better to evaluate other possible reasons for COPD in a non-smoker

## 2017-11-25 NOTE — Assessment & Plan Note (Signed)
Continues to complain of depression Wellbutrin is being managed by Dr. Clent RidgesFry Previous referral to psych, but patient never followed up Not suicidal per both patient and her husband Plan Follow up with Dr. Clent RidgesFry as he has been managing her depression treatment

## 2017-11-25 NOTE — Assessment & Plan Note (Addendum)
Basilar atelectasis per CXR Plan: IS Q1 while awake

## 2017-11-25 NOTE — Assessment & Plan Note (Signed)
Continue wearing oxygen to maintain oxygen saturations 88-92%

## 2017-11-29 ENCOUNTER — Telehealth: Payer: Self-pay | Admitting: Acute Care

## 2017-11-29 ENCOUNTER — Other Ambulatory Visit: Payer: Self-pay

## 2017-11-29 DIAGNOSIS — J439 Emphysema, unspecified: Secondary | ICD-10-CM

## 2017-11-29 DIAGNOSIS — R942 Abnormal results of pulmonary function studies: Secondary | ICD-10-CM

## 2017-11-29 NOTE — Telephone Encounter (Signed)
Patient was seen by SG on 9/18 and was advised to restart the Spiriva for her COPD. Patient stated that she has not noticed a difference in her flare ups and continues to having increased coughing and wheezing.   She wants to know if there is another inhaler that she can use.   Pharmacy is [facility] on [address].   SG, please advise. Thanks!

## 2017-11-29 NOTE — Telephone Encounter (Signed)
Called and spoke with patient she is aware and verbalized understanding. Nothing further needed.  

## 2017-11-29 NOTE — Telephone Encounter (Signed)
Please call the patient . She needs to give it a longer time to work.This is not a drug that she will immediately feel the difference using. She needs to use it for a month to give it time to make a difference. Thanks so much.

## 2017-12-01 ENCOUNTER — Telehealth: Payer: Self-pay | Admitting: Acute Care

## 2017-12-01 NOTE — Telephone Encounter (Signed)
Per Archie Patten w/ [facility], they do not carry an incentive spirometer.  I sent order to [facility].  Called pt & made her aware.

## 2017-12-01 NOTE — Telephone Encounter (Signed)
Called and spoke to [facility]), who is requesting update on incentive spirometer. Per our records ordered was placed on 11/29/17 to [facility]. Nida Boatman stated Christoper Allegra has not reached out to pt as of yet.  PCC's can you guys help with this? Thanks

## 2017-12-06 ENCOUNTER — Ambulatory Visit: Payer: Medicare HMO | Admitting: Acute Care

## 2017-12-06 ENCOUNTER — Encounter: Payer: Self-pay | Admitting: Acute Care

## 2017-12-06 NOTE — Progress Notes (Deleted)
History of Present Illness Laurie Schaefer is a 62 y.o. female remote former smoker ( Quit 1992)withCOPD,vocal cord dysfunctionAndACE related cough, Bi Polar disease and fibromyalgia. She wears nocturnal oxygen ar 2 L NCand also as needed.She is on chronic Norco for pain management ( Managed by Dr. Clent Schaefer). She is followed by Dr. Delton Schaefer.    12/06/2017  2 week follow up for COPD exacerbation: Pt was seen 9/18 for worsening chest congestion, shortness of breath, weakness and self reporting of  low oxygen saturations despite use of oxygen at 3 L [place]. CXR showed no active acute cardiopulmonary disease, but did show streaky atelectasis. Upon obtaining history, we discovered she was compliant with her Symbicort, but she had stopped taking her Spiriva ( against medical advice) . Plan at that time for COPD exacerbation was for prednisone taper, Doxycycline 100 mg BID x 7 days, oxygen to maintain saturations 88-92%, Incentive Spirometry for atelectasis and follow up with Dr. Clent Schaefer for continued issues with depression. She presents today for follow up. She states she has been    Is she still smoking?   Continue the Symbicort 2 puffs twice daily Restart Spiriva 2 puffs once  daily Rinse mouth after use Nebulizer treatments  Prednisone taper; 10 mg tablets: 4 tabs x 2 days, 3 tabs x 2 days, 2 tabs x 2 days 1 tab x 2 days then stop. Doxycycline 100 mg twice daily x 7 days. Take probiotic with antibiotiic Continue oxygen at 3 L [place] Saturation goals are 88-92% Follow up with Dr. Clent Schaefer for your continued depression Follow up with Laurie Schaefer in 2 weeks to make sure you are better. Note your daily symptoms >remember "red flags" for COPD: Increase in cough, increase in sputum production, increase in shortness of breath or activity intolerance. If you notice these symptoms, please call to be seen.  Please contact office for sooner follow up if symptoms do not improve or worsen or seek emergency  care  Consider HRCT when better to evaluate other possible reasons for COPD in a non-smoker  Test Results: CXR 11/24/2017 No active cardiopulmonary disease. Streaky atelectasis at the bases.  CBC Latest Ref Rng & Units 08/19/2017 07/16/2017 06/29/2017  WBC 4.0 - 10.5 K/uL 6.5 9.1 12.1(H)  Hemoglobin 12.0 - 15.0 g/dL 29.5 62.1 30.8  Hematocrit 36.0 - 46.0 % 38.9 36.5 41.0  Platelets 150 - 400 K/uL 266 328.0 285.0    BMP Latest Ref Rng & Units 08/19/2017 07/16/2017 06/29/2017  Glucose 65 - 99 mg/dL 657(Q) 89 89  BUN 6 - 20 mg/dL 13 10 46(N)  Creatinine 0.44 - 1.00 mg/dL 6.29 5.28 4.13  Sodium 135 - 145 mmol/L 142 140 139  Potassium 3.5 - 5.1 mmol/L 3.9 3.7 3.8  Chloride 101 - 111 mmol/L 106 106 102  CO2 22 - 32 mmol/L 28 27 31   Calcium 8.9 - 10.3 mg/dL 9.0 8.5 9.7    BNP    Component Value Date/Time   BNP 24.6 08/19/2017 1107    ProBNP    Component Value Date/Time   PROBNP 74.0 07/16/2017 1549    PFT    Component Value Date/Time   FEV1PRE 1.12 08/30/2017 0957   FEV1POST 1.02 08/30/2017 0957   FVCPRE 1.78 08/30/2017 0957   FVCPOST 1.84 08/30/2017 0957   TLC 4.10 08/30/2017 0957   DLCOUNC 15.88 08/30/2017 0957   PREFEV1FVCRT 63 08/30/2017 0957   PSTFEV1FVCRT 55 08/30/2017 0957    Dg Chest 2 View  Result Date: 11/24/2017 CLINICAL DATA:  COPD, chest tightness EXAM: CHEST - 2 VIEW COMPARISON:  CT 08/19/2017, radiograph 08/19/2017 FINDINGS: Streaky bibasilar atelectasis. No focal consolidation or pleural effusion. Cardiomediastinal silhouette within normal limits. No pneumothorax. IMPRESSION: No active cardiopulmonary disease. Streaky atelectasis at the bases. Electronically Signed   By: Laurie Schaefer M.D.   On: 11/24/2017 17:41     Past medical hx Past Medical History:  Diagnosis Date  . Arthritis   . Chronic neck pain    had [facility]   . COPD (chronic obstructive pulmonary disease) (HCC)    sees Dr. Delton Schaefer   . Depression    . Fibromyalgia   . Fibromyalgia   . Headache   . Hypertension      Social History   Tobacco Use  . Smoking status: Former Smoker    Packs/day: 0.80    Years: 0.25    Pack years: 0.20    Types: Cigarettes    Last attempt to quit: 03/09/1990    Years since quitting: 27.7  . Smokeless tobacco: Never Used  . Tobacco comment: 1 cig every 2 weeks  Substance Use Topics  . Alcohol use: No    Alcohol/week: 0.0 standard drinks  . Drug use: No    Laurie Schaefer reports that she quit smoking about 27 years ago. Her smoking use included cigarettes. She has a 0.20 pack-year smoking history. She has never used smokeless tobacco. She reports that she does not drink alcohol or use drugs.  Tobacco Cessation: Counseling given: Not Answered Comment: 1 cig every 2 weeks   Past surgical hx, Family hx, Social hx all reviewed.  Current Outpatient Medications on File Prior to Visit  Medication Sig  . budesonide-formoterol (SYMBICORT) 160-4.5 MCG/ACT inhaler Inhale 2 puffs into the lungs 2 (two) times daily.  Marland Kitchen buPROPion (WELLBUTRIN XL) 300 MG 24 hr tablet Take 1 tablet (300 mg total) by mouth daily.  . cholecalciferol (VITAMIN D) 1000 units tablet Take 1,000 Units by mouth daily.  Marland Kitchen doxycycline (VIBRA-TABS) 100 MG tablet Take 1 tablet (100 mg total) by mouth 2 (two) times daily.  Marland Kitchen estradiol (ESTRACE) 1 MG tablet Take 1 tablet (1 mg total) by mouth daily.  Marland Kitchen Fexofenadine HCl (MUCINEX ALLERGY PO) Take 1 tablet by mouth daily as needed (allergies/congestion.).   Marland Kitchen fluticasone (FLONASE) 50 MCG/ACT nasal spray Place 2 sprays into both nostrils daily.  Marland Kitchen ibuprofen (ADVIL,MOTRIN) 800 MG tablet TAKE 1 TABLET BY MOUTH EVERY 6 HOURS AS NEEDED FOR PAIN  . ketorolac (TORADOL) 10 MG tablet Take 10 mg every 6 hours for 2 days, then every 8 hours for 3 more days. Take with food and antacids.  . lamoTRIgine (LAMICTAL) 25 MG tablet Take 1 tablet (25 mg total) by mouth 2 (two) times daily.  Marland Kitchen levalbuterol (XOPENEX)  0.63 MG/3ML nebulizer solution Take 3 mLs (0.63 mg total) by nebulization every 4 (four) hours as needed for wheezing or shortness of breath.  Marland Kitchen LORazepam (ATIVAN) 2 MG tablet Take 1 tablet (2 mg total) by mouth every 8 (eight) hours as needed for anxiety.  Marland Kitchen losartan-hydrochlorothiazide (HYZAAR) 100-25 MG tablet Take 1 tablet by mouth daily.  . medroxyPROGESTERone (PROVERA) 2.5 MG tablet Take 1 tablet (2.5 mg total) by mouth daily.  . OXYGEN 3lpm with sleep and exertion if needed  . pantoprazole (PROTONIX) 40 MG tablet Take 1 tablet (40 mg total) by mouth daily.  . predniSONE (DELTASONE) 10 MG tablet Take 4 tabs for 2 days, then 3 tabs  for 2 days, 2 tabs for 2 days, then 1 tab for 2 days, then stop.  . QUEtiapine (SEROQUEL) 25 MG tablet TAKE 1 TABLET BY MOUTH EVERY 6 HOURS AS NEEDED  . ranitidine (ZANTAC) 150 MG capsule Take 1 capsule (150 mg total) by mouth 2 (two) times daily for 10 days.  . Tiotropium Bromide Monohydrate (SPIRIVA RESPIMAT) 2.5 MCG/ACT AERS Inhale 2 puffs into the lungs daily.  Marland Kitchen zolpidem (AMBIEN) 10 MG tablet TAKE 1 TABLET BY MOUTH EVERY NIGHT AT BEDTIME AS NEEDED SLEEP   No current facility-administered medications on file prior to visit.      Allergies  Allergen Reactions  . Codeine Nausea Only    Review Of Systems:  Constitutional:   No  weight loss, night sweats,  Fevers, chills, fatigue, or  lassitude.  HEENT:   No headaches,  Difficulty swallowing,  Tooth/dental problems, or  Sore throat,                No sneezing, itching, ear ache, nasal congestion, post nasal drip,   CV:  No chest pain,  Orthopnea, PND, swelling in lower extremities, anasarca, dizziness, palpitations, syncope.   GI  No heartburn, indigestion, abdominal pain, nausea, vomiting, diarrhea, change in bowel habits, loss of appetite, bloody stools.   Resp: No shortness of breath with exertion or at rest.  No excess mucus, no productive cough,  No non-productive cough,  No coughing up of blood.   No change in color of mucus.  No wheezing.  No chest wall deformity  Skin: no rash or lesions.  GU: no dysuria, change in color of urine, no urgency or frequency.  No flank pain, no hematuria   MS:  No joint pain or swelling.  No decreased range of motion.  No back pain.  Psych:  No change in mood or affect. No depression or anxiety.  No memory loss.   Vital Signs There were no vitals taken for this visit.   Physical Exam:  General- No distress,  A&Ox3 ENT: No sinus tenderness, TM clear, pale nasal mucosa, no oral exudate,no post nasal drip, no LAN Cardiac: S1, S2, regular rate and rhythm, no murmur Chest: No wheeze/ rales/ dullness; no accessory muscle use, no nasal flaring, no sternal retractions Abd.: Soft Non-tender Ext: No clubbing cyanosis, edema Neuro:  normal strength Skin: No rashes, warm and dry Psych: normal mood and behavior   Assessment/Plan  No problem-specific Assessment & Plan notes found for this encounter.    Bevelyn Ngo, NP 12/06/2017  9:13 AM

## 2017-12-08 ENCOUNTER — Ambulatory Visit: Payer: Medicare HMO | Admitting: Acute Care

## 2017-12-08 ENCOUNTER — Encounter: Payer: Self-pay | Admitting: Acute Care

## 2017-12-08 VITALS — BP 138/100 | HR 97 | Ht 64.0 in | Wt 218.0 lb

## 2017-12-08 DIAGNOSIS — I270 Primary pulmonary hypertension: Secondary | ICD-10-CM

## 2017-12-08 DIAGNOSIS — R030 Elevated blood-pressure reading, without diagnosis of hypertension: Secondary | ICD-10-CM

## 2017-12-08 DIAGNOSIS — J9611 Chronic respiratory failure with hypoxia: Secondary | ICD-10-CM

## 2017-12-08 DIAGNOSIS — F320 Major depressive disorder, single episode, mild: Secondary | ICD-10-CM

## 2017-12-08 DIAGNOSIS — R0683 Snoring: Secondary | ICD-10-CM

## 2017-12-08 DIAGNOSIS — I10 Essential (primary) hypertension: Secondary | ICD-10-CM

## 2017-12-08 DIAGNOSIS — Z0000 Encounter for general adult medical examination without abnormal findings: Secondary | ICD-10-CM

## 2017-12-08 DIAGNOSIS — J9811 Atelectasis: Secondary | ICD-10-CM

## 2017-12-08 DIAGNOSIS — Z23 Encounter for immunization: Secondary | ICD-10-CM | POA: Diagnosis not present

## 2017-12-08 DIAGNOSIS — G4733 Obstructive sleep apnea (adult) (pediatric): Secondary | ICD-10-CM | POA: Diagnosis not present

## 2017-12-08 DIAGNOSIS — R5383 Other fatigue: Secondary | ICD-10-CM

## 2017-12-08 DIAGNOSIS — J439 Emphysema, unspecified: Secondary | ICD-10-CM | POA: Diagnosis not present

## 2017-12-08 NOTE — Assessment & Plan Note (Deleted)
BP today in the office was 138/100 Plan: Monitor

## 2017-12-08 NOTE — Progress Notes (Signed)
History of Present Illness Laurie Schaefer is a 62 y.o. female former smoker , quit 1992 withCOPD,vocal cord dysfunction, ACE related cough,  Bi Polar disease and fibromyalgia. She is followed by Dr. Delton Coombes.  Laurie Schaefer is a 62 y.o. female remote former smoker ( Quit 1992)withCOPD,vocal cord dysfunctionAndACE related cough, and, Bi Polar disease and fibromyalgia. She wears nocturnal oxygen ar 2 L NCand as needed.She is on chronic Norco for pain management ( Managed by Dr. Clent Ridges). She is followed by Dr. Delton Coombes.    12/08/2017  Follow up OV: Pt. Presents for follow up. She was seen 11/24/2017 for a COPD exacerbation. She was prescribed Doxycycline and pred taper. She is here for follow up. CXR at the time showed streaky atelectasis., but otherwise no acute cardiopulmonary. She states she is better. She completed her pred taper and her doxycycline. She had some trouble with her incentive spirometer , and how to use it. We have educated both her and her husband on proper use of the IS. She was able to return demonstrate.She states she has worsening fatigue.  Per her husband she snores and she has been waking up tired and  with a HA. She wakes up multiple times through the night, about 3 times each evening.She states she remains tired until she does her inhalers and neb treatment in the morning. She denies chest pain, orthopnea or wheezing. She denies hemoptysis  Test Results: CXR 11/24/2017:>> personally reviewed by me No active cardiopulmonary disease. Streaky atelectasis at the bases  CBC Latest Ref Rng & Units 08/19/2017 07/16/2017 06/29/2017  WBC 4.0 - 10.5 K/uL 6.5 9.1 12.1(H)  Hemoglobin 12.0 - 15.0 g/dL 28.4 13.2 44.0  Hematocrit 36.0 - 46.0 % 38.9 36.5 41.0  Platelets 150 - 400 K/uL 266 328.0 285.0    BMP Latest Ref Rng & Units 08/19/2017 07/16/2017 06/29/2017  Glucose 65 - 99 mg/dL 102(V) 89 89  BUN 6 - 20 mg/dL 13 10 25(D)  Creatinine 0.44 - 1.00 mg/dL 6.64 4.03 4.74    Sodium 135 - 145 mmol/L 142 140 139  Potassium 3.5 - 5.1 mmol/L 3.9 3.7 3.8  Chloride 101 - 111 mmol/L 106 106 102  CO2 22 - 32 mmol/L 28 27 31   Calcium 8.9 - 10.3 mg/dL 9.0 8.5 9.7    BNP    Component Value Date/Time   BNP 24.6 08/19/2017 1107    ProBNP    Component Value Date/Time   PROBNP 74.0 07/16/2017 1549    PFT    Component Value Date/Time   FEV1PRE 1.12 08/30/2017 0957   FEV1POST 1.02 08/30/2017 0957   FVCPRE 1.78 08/30/2017 0957   FVCPOST 1.84 08/30/2017 0957   TLC 4.10 08/30/2017 0957   DLCOUNC 15.88 08/30/2017 0957   PREFEV1FVCRT 63 08/30/2017 0957   PSTFEV1FVCRT 55 08/30/2017 0957    Dg Chest 2 View  Result Date: 11/24/2017 CLINICAL DATA:  COPD, chest tightness EXAM: CHEST - 2 VIEW COMPARISON:  CT 08/19/2017, radiograph 08/19/2017 FINDINGS: Streaky bibasilar atelectasis. No focal consolidation or pleural effusion. Cardiomediastinal silhouette within normal limits. No pneumothorax. IMPRESSION: No active cardiopulmonary disease. Streaky atelectasis at the bases. Electronically Signed   By: Jasmine Pang M.D.   On: 11/24/2017 17:41     Past medical hx Past Medical History:  Diagnosis Date  . Arthritis   . Chronic neck pain    had previously seen Dr. Salli Real at [facility]   . COPD (chronic obstructive pulmonary disease) (HCC)    sees  Dr. Delton Coombes   . Depression   . Fibromyalgia   . Fibromyalgia   . Headache   . Hypertension      Social History   Tobacco Use  . Smoking status: Former Smoker    Packs/day: 0.80    Years: 25.00    Pack years: 20.00    Types: Cigarettes    Last attempt to quit: 03/09/1990    Years since quitting: 27.7  . Smokeless tobacco: Never Used  . Tobacco comment: 1 cig every 2 weeks  Substance Use Topics  . Alcohol use: No    Alcohol/week: 0.0 standard drinks  . Drug use: No    Ms.Mccalip reports that she quit smoking about 27 years ago. Her smoking use included cigarettes. She has a 20.00 pack-year smoking  history. She has never used smokeless tobacco. She reports that she does not drink alcohol or use drugs.  Tobacco Cessation: Previous smoker quit smoking 1992  Past surgical hx, Family hx, Social hx all reviewed.  Current Outpatient Medications on File Prior to Visit  Medication Sig  . budesonide-formoterol (SYMBICORT) 160-4.5 MCG/ACT inhaler Inhale 2 puffs into the lungs 2 (two) times daily.  Marland Kitchen buPROPion (WELLBUTRIN XL) 300 MG 24 hr tablet Take 1 tablet (300 mg total) by mouth daily.  . cholecalciferol (VITAMIN D) 1000 units tablet Take 1,000 Units by mouth daily.  Marland Kitchen doxycycline (VIBRA-TABS) 100 MG tablet Take 1 tablet (100 mg total) by mouth 2 (two) times daily.  Marland Kitchen estradiol (ESTRACE) 1 MG tablet Take 1 tablet (1 mg total) by mouth daily.  Marland Kitchen Fexofenadine HCl (MUCINEX ALLERGY PO) Take 1 tablet by mouth daily as needed (allergies/congestion.).   Marland Kitchen fluticasone (FLONASE) 50 MCG/ACT nasal spray Place 2 sprays into both nostrils daily.  Marland Kitchen ibuprofen (ADVIL,MOTRIN) 800 MG tablet TAKE 1 TABLET BY MOUTH EVERY 6 HOURS AS NEEDED FOR PAIN  . ketorolac (TORADOL) 10 MG tablet Take 10 mg every 6 hours for 2 days, then every 8 hours for 3 more days. Take with food and antacids.  . lamoTRIgine (LAMICTAL) 25 MG tablet Take 1 tablet (25 mg total) by mouth 2 (two) times daily.  Marland Kitchen levalbuterol (XOPENEX) 0.63 MG/3ML nebulizer solution Take 3 mLs (0.63 mg total) by nebulization every 4 (four) hours as needed for wheezing or shortness of breath.  Marland Kitchen LORazepam (ATIVAN) 2 MG tablet Take 1 tablet (2 mg total) by mouth every 8 (eight) hours as needed for anxiety.  Marland Kitchen losartan-hydrochlorothiazide (HYZAAR) 100-25 MG tablet Take 1 tablet by mouth daily.  . medroxyPROGESTERone (PROVERA) 2.5 MG tablet Take 1 tablet (2.5 mg total) by mouth daily.  . OXYGEN 3lpm with sleep and exertion if needed  . pantoprazole (PROTONIX) 40 MG tablet Take 1 tablet (40 mg total) by mouth daily.  . predniSONE (DELTASONE) 10 MG tablet Take 4  tabs for 2 days, then 3 tabs for 2 days, 2 tabs for 2 days, then 1 tab for 2 days, then stop.  . QUEtiapine (SEROQUEL) 25 MG tablet TAKE 1 TABLET BY MOUTH EVERY 6 HOURS AS NEEDED  . Tiotropium Bromide Monohydrate (SPIRIVA RESPIMAT) 2.5 MCG/ACT AERS Inhale 2 puffs into the lungs daily.  Marland Kitchen zolpidem (AMBIEN) 10 MG tablet TAKE 1 TABLET BY MOUTH EVERY NIGHT AT BEDTIME AS NEEDED SLEEP  . ranitidine (ZANTAC) 150 MG capsule Take 1 capsule (150 mg total) by mouth 2 (two) times daily for 10 days.   No current facility-administered medications on file prior to visit.      Allergies  Allergen Reactions  . Codeine Nausea Only    Review Of Systems:  Constitutional:   No  weight loss, night sweats,  Fevers, chills,+ fatigue, or  lassitude.  HEENT:   + early am  headaches, No  Difficulty swallowing,  Tooth/dental problems, or  Sore throat,                No sneezing, itching, ear ache, nasal congestion, post nasal drip,   CV:  No chest pain,  Orthopnea, PND, swelling in lower extremities, anasarca, dizziness, palpitations, syncope.   GI  No heartburn, indigestion, abdominal pain, nausea, vomiting, diarrhea, change in bowel habits, loss of appetite, bloody stools.   Resp: + baseline  shortness of breath with exertion less at rest.  No excess mucus, no productive cough,  No non-productive cough,  No coughing up of blood.  No change in color of mucus.  No wheezing.  No chest wall deformity  Skin: no rash or lesions.  GU: no dysuria, change in color of urine, no urgency or frequency.  No flank pain, no hematuria   MS:  No joint pain or swelling.  No decreased range of motion.  No back pain.  Psych:  No change in mood or affect. No depression or anxiety.  No memory loss.   Vital Signs BP (!) 138/100 (BP Location: Left Arm, Cuff Size: Normal)   Pulse 97   Ht 5\' 4"  (1.626 m)   Wt 218 lb (98.9 kg)   SpO2 95%   BMI 37.42 kg/m    Physical Exam:  General- No distress,  A&Ox3, pleasant, wearing  nasal oxygen at 2 L ENT: No sinus tenderness, TM clear, pale nasal mucosa, no oral exudate,no post nasal drip, no LAN Cardiac: S1, S2, regular rate and rhythm, no murmur Chest: No wheeze/ rales/ dullness; no accessory muscle use, no nasal flaring, no sternal retractions Abd.: Soft Non-tender, ND, BS +, NT, Body mass index is 37.42 kg/m. Ext: No clubbing cyanosis, edema, no obvious deformities Neuro:  normal strength, MAE x 4, A&O x 3, appropriate Skin: No rashes, warm and dry, intact Psych: normal mood and behavior   Assessment/Plan  COPD (chronic obstructive pulmonary disease) (HCC) Resolved flare Baseline status Plan: Continue the Symbicort 2 puffs twice daily Continue  Spiriva 2 puffs once  daily Rinse mouth after use Continue Nebulizer treatments  Continue oxygen at 2-3 L [place] Saturation goals are 88-92% Follow up with Dr. Clent Ridges for your continued depression Note your daily symptoms >remember "red flags" for COPD: Increase in cough, increase in sputum production, increase in shortness of breath or activity intolerance. If you notice these symptoms, please call to be seen.  Please contact office for sooner follow up if symptoms do not improve or worsen or seek emergency care  Consider HRCT when better to evaluate other possible reasons for COPD in a non-smoker   Chronic respiratory failure with hypoxia (HCC) Continue wearing oxygen to maintain oxygen saturations 88-92%   Depression Please follow up with Dr. Abran Cantor re: medication adjustments  Atelectasis Plan Continue IS Q 1 while awake When watching TV use with each commercial Increase volumes to 1200 cc as goal  Fatigue Pt. With fatigue, awakening with HA and tired, Snores with sleep, frequent awakenings at night Hx. hypertension Plan: Home Sleep study to evaluate for OSA  Essential hypertension Continue medications as prescribed Follow up with Dr. Clent Ridges  Preventative health care Annual Flu vaccine  today    Bevelyn Ngo, NP 12/08/2017  3:14  PM

## 2017-12-08 NOTE — Patient Instructions (Addendum)
It is great to see you today. I am glad you are feeling better. We will schedule for a home sleep study. Continue the Symbicort 2 puffs twice daily Resume Spiriva 2 puffs once  daily Rinse mouth after use Continue Nebulizer treatments  Continue oxygen at 2-3 L [place] Saturation goals are 88-92% Follow up with Dr. Clent Ridges for your continued depression Note your daily symptoms >remember "red flags" for COPD: Increase in cough, increase in sputum production, increase in shortness of breath or activity intolerance. If you notice these symptoms, please call to be seen.  Please contact office for sooner follow up if symptoms do not improve or worsen or seek emergency care  We will refer back to Pulmonary rehab once sleep eval is completed. Continue wearing oxygen to maintain oxygen saturations 88-92% Continue your Incentive Spirometry every hour while awake Follow up after sleep test is complete. Follow up in 3 months with Sarah or Dr. Delton Coombes Flu vaccine today Please contact office for sooner follow up if symptoms do not improve or worsen or seek emergency care

## 2017-12-08 NOTE — Assessment & Plan Note (Signed)
Annual Flu vaccine today

## 2017-12-08 NOTE — Assessment & Plan Note (Signed)
Plan Continue IS Q 1 while awake When watching TV use with each commercial Increase volumes to 1200 cc as goal

## 2017-12-08 NOTE — Assessment & Plan Note (Signed)
Continue medications as prescribed Follow up with Dr. Clent Ridges

## 2017-12-08 NOTE — Assessment & Plan Note (Signed)
Continue wearing oxygen to maintain oxygen saturations 88-92% 

## 2017-12-08 NOTE — Assessment & Plan Note (Signed)
Please follow up with Dr. Abran Cantor re: medication adjustments

## 2017-12-08 NOTE — Assessment & Plan Note (Signed)
Pt. With fatigue, awakening with HA and tired, Snores with sleep, frequent awakenings at night Hx. hypertension Plan: Home Sleep study to evaluate for OSA

## 2017-12-08 NOTE — Assessment & Plan Note (Signed)
Resolved flare Baseline status Plan: Continue the Symbicort 2 puffs twice daily Continue  Spiriva 2 puffs once  daily Rinse mouth after use Continue Nebulizer treatments  Continue oxygen at 2-3 L [place] Saturation goals are 88-92% Follow up with Dr. Clent Ridges for your continued depression Note your daily symptoms >remember "red flags" for COPD: Increase in cough, increase in sputum production, increase in shortness of breath or activity intolerance. If you notice these symptoms, please call to be seen.  Please contact office for sooner follow up if symptoms do not improve or worsen or seek emergency care  Consider HRCT when better to evaluate other possible reasons for COPD in a non-smoker

## 2017-12-10 ENCOUNTER — Other Ambulatory Visit: Payer: Self-pay | Admitting: Family Medicine

## 2017-12-10 NOTE — Telephone Encounter (Signed)
Dr. Fry please advise. Thanks  

## 2017-12-10 NOTE — Telephone Encounter (Signed)
Cal in #90 with 5 rf 

## 2017-12-15 ENCOUNTER — Other Ambulatory Visit: Payer: Self-pay | Admitting: Family Medicine

## 2017-12-15 NOTE — Telephone Encounter (Signed)
Copied from CRM [id number]. Topic: Quick Communication - See Telephone Encounter >> Dec 15, 2017  4:15 PM Terisa Starr wrote: CRM for notification. See Telephone encounter for: 12/15/17.  HYDROcodone-acetaminophen (NORCO) 10-325 MG tablet [[id number]]  ENDED & pantoprazole (PROTONIX) 40 MG tablet ( pharmacy states to her that she does not have refills )  [id number] [place], [place] - [address] AT [facility] [address] [place] [address]

## 2017-12-15 NOTE — Telephone Encounter (Signed)
Patient is requesting: hydrocodone- acetaminophen 10-325 mg   Last filled 07/28/17                                    Pantoprazole 40 mg            Last ordered: 04/09/17  LOV: 08/23/17  PCP: Clent Ridges  Pharmacy: verified

## 2017-12-17 MED ORDER — PANTOPRAZOLE SODIUM 40 MG PO TBEC: 40.0000 mg | DELAYED_RELEASE_TABLET | Freq: Every day | ORAL | 3 refills | Status: DC

## 2017-12-23 ENCOUNTER — Telehealth: Payer: Self-pay | Admitting: *Deleted

## 2017-12-23 NOTE — Telephone Encounter (Signed)
Pharmacy sent in refill request for the ranitidine 150 mg  Capsules.  This was last filled by Dr. Penne Lash for #20.  Dr. Clent Ridges please advise on refill. Thanks

## 2017-12-26 ENCOUNTER — Encounter: Payer: Self-pay | Admitting: Family Medicine

## 2017-12-27 MED ORDER — PANTOPRAZOLE SODIUM 40 MG PO TBEC: 40.0000 mg | DELAYED_RELEASE_TABLET | Freq: Every day | ORAL | 3 refills | Status: DC

## 2017-12-27 NOTE — Telephone Encounter (Signed)
Called and spoke with pt and she stated that she does use the pantoprazole.  She also requested that her hydrocodone be escribed in for her as she is having a copd flare and is not able to come in.  Dr. Clent Ridges please advise.  The pantoprazole has been sent to the pharmacy.

## 2017-12-27 NOTE — Telephone Encounter (Signed)
Please ask the patient if she is taking Ranitidine or Pantoprazole?

## 2017-12-28 NOTE — Telephone Encounter (Signed)
She will need a PMV for any refills of narcotics

## 2017-12-28 NOTE — Telephone Encounter (Signed)
My chart message sent back to pt about appt since she had sent one in as well.

## 2017-12-30 ENCOUNTER — Telehealth: Payer: Self-pay | Admitting: Acute Care

## 2017-12-30 NOTE — Telephone Encounter (Signed)
Spoke with the pt's spouse  He reports that the pt's breathing has been worse recently and she has been experiencing panic attacks  He states that she is ready for Hospice care  I advised needs ov to discuss   I offered appt with Archie Patten  Spouse then put the pt on the phone and she told me she would need to call back  Will await call   WHEN SHE CALLS BACK MAKE HER APPT WITH TONYA TODAY IF SHE IS WILLING

## 2018-01-03 ENCOUNTER — Ambulatory Visit (INDEPENDENT_AMBULATORY_CARE_PROVIDER_SITE_OTHER): Payer: Medicare HMO | Admitting: Family Medicine

## 2018-01-03 ENCOUNTER — Encounter: Payer: Self-pay | Admitting: Family Medicine

## 2018-01-03 VITALS — BP 130/84 | HR 98 | Temp 98.4°F | Wt 214.4 lb

## 2018-01-03 DIAGNOSIS — M797 Fibromyalgia: Secondary | ICD-10-CM

## 2018-01-03 DIAGNOSIS — F1190 Opioid use, unspecified, uncomplicated: Secondary | ICD-10-CM | POA: Diagnosis not present

## 2018-01-03 MED ORDER — HYDROCODONE-ACETAMINOPHEN 10-325 MG PO TABS: 1.0000 | ORAL_TABLET | Freq: Four times a day (QID) | ORAL | 0 refills | Status: AC | PRN

## 2018-01-03 MED ORDER — HYDROCODONE-ACETAMINOPHEN 10-325 MG PO TABS: 1.0000 | ORAL_TABLET | Freq: Four times a day (QID) | ORAL | 0 refills | Status: DC | PRN

## 2018-01-03 MED ORDER — ASENAPINE MALEATE 2.5 MG SL SUBL: 2.5000 mg | SUBLINGUAL_TABLET | Freq: Two times a day (BID) | SUBLINGUAL | 5 refills | Status: DC | PRN

## 2018-01-03 NOTE — Telephone Encounter (Signed)
Called and spoke with pt to see if she was ready to go ahead and make an appt with a provider at our office due to her breathing.  Pt stated she was feeling better and stated she had a bad day Thursday, 10/24 and due to her being better, pt does not want to schedule an appt at this time.  Stated to pt if her breathing did become bad again to call our office and we would get her scheduled for an OV.  Pt expressed understanding. Nothing further needed.

## 2018-01-03 NOTE — Progress Notes (Signed)
   Subjective:    Patient ID: Laurie Schaefer, female    DOB: May 20, 1955, 62 y.o.   MRN: [id number]  HPI Here for pain management. She is doing well.  Indication for chronic opioid: fibromyalgia  Medication and dose: Norco 10-325  # pills per month: 120 Last UDS date: 04-30-17 Opioid Treatment Agreement signed (Y/N): 07-28-17 Opioid Treatment Agreement last reviewed with patient:  01-03-18 NCCSRS reviewed this encounter (include red flags):  01-03-18    Review of Systems  Constitutional: Negative.   Respiratory: Positive for shortness of breath.   Cardiovascular: Negative.   Musculoskeletal: Positive for myalgias.  Neurological: Negative.        Objective:   Physical Exam  Constitutional: She is oriented to person, place, and time. She appears well-developed and well-nourished.  Cardiovascular: Normal rate, regular rhythm, normal heart sounds and intact distal pulses.  Pulmonary/Chest: Effort normal and breath sounds normal.  Neurological: She is alert and oriented to person, place, and time.          Assessment & Plan:  Pain management, meds were refilled. Gershon Crane, MD

## 2018-01-07 ENCOUNTER — Telehealth: Payer: Self-pay | Admitting: *Deleted

## 2018-01-07 NOTE — Telephone Encounter (Signed)
Prior auth for Saphris 2.5mg  tablets sent to [email or web address] [email or web address].

## 2018-01-11 ENCOUNTER — Encounter (HOSPITAL_BASED_OUTPATIENT_CLINIC_OR_DEPARTMENT_OTHER): Payer: Medicare HMO

## 2018-02-02 ENCOUNTER — Encounter: Payer: Self-pay | Admitting: Nurse Practitioner

## 2018-02-02 ENCOUNTER — Telehealth: Payer: Self-pay | Admitting: Acute Care

## 2018-02-02 ENCOUNTER — Telehealth: Payer: Self-pay | Admitting: Family Medicine

## 2018-02-02 ENCOUNTER — Ambulatory Visit (INDEPENDENT_AMBULATORY_CARE_PROVIDER_SITE_OTHER): Payer: Medicare HMO | Admitting: Nurse Practitioner

## 2018-02-02 VITALS — BP 126/90 | HR 92 | Temp 98.6°F | Ht 64.0 in | Wt 213.2 lb

## 2018-02-02 DIAGNOSIS — J441 Chronic obstructive pulmonary disease with (acute) exacerbation: Secondary | ICD-10-CM | POA: Diagnosis not present

## 2018-02-02 DIAGNOSIS — R0602 Shortness of breath: Secondary | ICD-10-CM

## 2018-02-02 MED ORDER — METHYLPREDNISOLONE ACETATE 80 MG/ML IJ SUSP
80.0000 mg | Freq: Once | INTRAMUSCULAR | Status: AC
  Administered 2018-02-02: 80 mg via INTRAMUSCULAR

## 2018-02-02 MED ORDER — LEVALBUTEROL HCL 0.63 MG/3ML IN NEBU: 0.6300 mg | INHALATION_SOLUTION | Freq: Once | RESPIRATORY_TRACT | Status: DC

## 2018-02-02 MED ORDER — DOXYCYCLINE HYCLATE 100 MG PO TABS: 100.0000 mg | ORAL_TABLET | Freq: Two times a day (BID) | ORAL | 0 refills | Status: DC

## 2018-02-02 MED ORDER — ALBUTEROL SULFATE HFA 108 (90 BASE) MCG/ACT IN AERS: 2.0000 | INHALATION_SPRAY | Freq: Four times a day (QID) | RESPIRATORY_TRACT | 6 refills | Status: DC | PRN

## 2018-02-02 MED ORDER — PREDNISONE 10 MG PO TABS: ORAL_TABLET | ORAL | 0 refills | Status: DC

## 2018-02-02 NOTE — Telephone Encounter (Signed)
Noted. Will route to [place] as an FYI.

## 2018-02-02 NOTE — Telephone Encounter (Signed)
Routing back to PCP office

## 2018-02-02 NOTE — Telephone Encounter (Signed)
Copied from CRM [id number]. Topic: Quick Communication - See Telephone Encounter >> Feb 02, 2018  1:23 PM Terisa Starraylor, Brittany L wrote: CRM for notification. See Telephone encounter for: 02/02/18.  Dan with [facility] called and said the prior authorization was finally approved for Asenapine Maleate (SAPHRIS) 2.5 MG SUBL but now it is going to cost $500. Her insurance said that the alternative for this medication is " olanzapine " it starts at 2.5 mg and goes up to 20mg . He is wondering if Dr Clent RidgesFry would be willing to change this medication. He can be reached at @ [phone]

## 2018-02-02 NOTE — Progress Notes (Signed)
 @Patient  ID: Sabas SousPamela White Schaefer, female    DOB: 1955-12-12, 62 y.o.   MRN: [id number]  Chief Complaint  Patient presents with  . Shortness of Breath    Referring provider: Nelwyn SalisburyFry, Stephen A, MD  HPI 62 year old female former smoker (quit 1992), vocal chord dysfunction, ACE related cough, Bipolar and fibromyalgia. She is followed by Dr. Delton CoombesByrum.   Tests: CXR 11/24/2017:>> No active cardiopulmonary disease. Streaky atelectasis at the bases  PFT Results Latest Ref Rng & Units 08/30/2017 12/27/2015  FVC-Pre L 1.78 1.23  FVC-Predicted Pre % 56 38  FVC-Post L 1.84 1.09  FVC-Predicted Post % 58 34  Pre FEV1/FVC % % 63 81  Post FEV1/FCV % % 55 89  FEV1-Pre L 1.12 1.00  FEV1-Predicted Pre % 46 40  FEV1-Post L 1.02 0.97  DLCO UNC% % 67 61  DLCO COR %Predicted % 87 92  TLC L 4.10 -  TLC % Predicted % 82 -  RV % Predicted % 65 -   OV 02/04/18 - acute shortness of breath and cough Patient presents for shortness of breath and cough. She states that symptoms started 3 days ago and have progressively worsened. Her cough is productive of yellow sputum. She states that she has had a fever. She does not have a fever today. She has had chills. Patient's sats are 97% on 3 L [place]. Patient states that she cannot tolerate neb treatments and she has ran out of her rescue inhaler. Has not had it in a couple of days. She is compliant with medications.     Allergies  Allergen Reactions  . Codeine Nausea Only    Immunization History  Administered Date(s) Administered  . Influenza Split 12/07/2016  . Influenza,inj,Quad PF,6+ Mos 01/29/2016, 12/08/2017  . Pneumococcal Polysaccharide-23 01/29/2016    Past Medical History:  Diagnosis Date  . Arthritis   . Chronic neck pain    had [facility]un [facility]   . COPD (chronic obstructive pulmonary disease) (HCC)    sees Dr. Delton CoombesByrum   . Depression   . Fibromyalgia   . Fibromyalgia   . Headache   . Hypertension      Tobacco History: Social History   Tobacco Use  Smoking Status Former Smoker  . Packs/day: 0.80  . Years: 25.00  . Pack years: 20.00  . Types: Cigarettes  . Last attempt to quit: 03/09/1990  . Years since quitting: 27.9  Smokeless Tobacco Never Used  Tobacco Comment   1 cig every 2 weeks   Counseling given: Yes Comment: 1 cig every 2 weeks   Outpatient Encounter Medications as of 02/02/2018  Medication Sig  . Asenapine Maleate (SAPHRIS) 2.5 MG SUBL Place 1 tablet (2.5 mg total) under the tongue 2 (two) times daily as needed (agitation).  . budesonide-formoterol (SYMBICORT) 160-4.5 MCG/ACT inhaler Inhale 2 puffs into the lungs 2 (two) times daily.  Marland Kitchen. buPROPion (WELLBUTRIN XL) 300 MG 24 hr tablet Take 1 tablet (300 mg total) by mouth daily.  . cholecalciferol (VITAMIN D) 1000 units tablet Take 1,000 Units by mouth daily.  Marland Kitchen. estradiol (ESTRACE) 1 MG tablet Take 1 tablet (1 mg total) by mouth daily.  Marland Kitchen. Fexofenadine HCl (MUCINEX ALLERGY PO) Take 1 tablet by mouth daily as needed (allergies/congestion.).   Marland Kitchen. fluticasone (FLONASE) 50 MCG/ACT nasal spray Place 2 sprays into both nostrils daily.  Melene Muller. [START ON 03/05/2018] HYDROcodone-acetaminophen (NORCO) 10-325 MG tablet Take 1 tablet by mouth every 6 (six) hours as  needed for moderate pain.  Marland Kitchen ibuprofen (ADVIL,MOTRIN) 800 MG tablet TAKE 1 TABLET BY MOUTH EVERY 6 HOURS AS NEEDED FOR PAIN  . ketorolac (TORADOL) 10 MG tablet Take 10 mg every 6 hours for 2 days, then every 8 hours for 3 more days. Take with food and antacids.  . lamoTRIgine (LAMICTAL) 25 MG tablet Take 1 tablet (25 mg total) by mouth 2 (two) times daily.  Marland Kitchen levalbuterol (XOPENEX) 0.63 MG/3ML nebulizer solution Take 3 mLs (0.63 mg total) by nebulization every 4 (four) hours as needed for wheezing or shortness of breath.  Marland Kitchen LORazepam (ATIVAN) 2 MG tablet TAKE 1 TABLET(2 MG) BY MOUTH EVERY 8 HOURS AS NEEDED FOR ANXIETY  . losartan-hydrochlorothiazide (HYZAAR) 100-25 MG tablet  Take 1 tablet by mouth daily.  . medroxyPROGESTERone (PROVERA) 2.5 MG tablet Take 1 tablet (2.5 mg total) by mouth daily.  . OXYGEN 3lpm with sleep and exertion if needed  . pantoprazole (PROTONIX) 40 MG tablet Take 1 tablet (40 mg total) by mouth daily.  . predniSONE (DELTASONE) 10 MG tablet Take 4 tabs for 2 days, then 3 tabs for 2 days, 2 tabs for 2 days, then 1 tab for 2 days, then stop.  . QUEtiapine (SEROQUEL) 25 MG tablet TAKE 1 TABLET BY MOUTH EVERY 6 HOURS AS NEEDED  . Tiotropium Bromide Monohydrate (SPIRIVA RESPIMAT) 2.5 MCG/ACT AERS Inhale 2 puffs into the lungs daily.  Marland Kitchen zolpidem (AMBIEN) 10 MG tablet TAKE 1 TABLET BY MOUTH EVERY NIGHT AT BEDTIME AS NEEDED SLEEP  . albuterol (PROVENTIL HFA;VENTOLIN HFA) 108 (90 Base) MCG/ACT inhaler Inhale 2 puffs into the lungs every 6 (six) hours as needed for wheezing or shortness of breath.  . doxycycline (VIBRA-TABS) 100 MG tablet Take 1 tablet (100 mg total) by mouth 2 (two) times daily.  . predniSONE (DELTASONE) 10 MG tablet Take 4 tabs for 2 days, then 3 tabs for 2 days, then 2 tabs for 2 days, then 1 tab for 2 days, then stop  . [EXPIRED] methylPREDNISolone acetate (DEPO-MEDROL) injection 80 mg   . [DISCONTINUED] levalbuterol (XOPENEX) nebulizer solution 0.63 mg    No facility-administered encounter medications on file as of 02/02/2018.      Review of Systems  Review of Systems  Constitutional: Positive for chills and fever.  HENT: Negative.   Respiratory: Positive for cough and shortness of breath. Negative for wheezing.   Cardiovascular: Negative.  Negative for chest pain, palpitations and leg swelling.  Gastrointestinal: Negative.   Allergic/Immunologic: Negative.   Neurological: Negative.   Psychiatric/Behavioral: Negative.        Physical Exam  BP 126/90 (BP Location: Left Arm, Patient Position: Sitting, Cuff Size: Normal)   Pulse 92   Temp 98.6 F (37 C)   Ht 5\' 4"  (1.626 m)   Wt 213 lb 3.2 oz (96.7 kg)   SpO2 97%  Comment: on 3L of O2 pulse  BMI 36.60 kg/m   Wt Readings from Last 5 Encounters:  02/02/18 213 lb 3.2 oz (96.7 kg)  01/03/18 214 lb 7 oz (97.3 kg)  12/08/17 218 lb (98.9 kg)  11/24/17 211 lb (95.7 kg)  09/22/17 212 lb (96.2 kg)     Physical Exam  Constitutional: She is oriented to person, place, and time. She appears well-developed and well-nourished. No distress.  Cardiovascular: Normal rate and regular rhythm.  Pulmonary/Chest: Effort normal and breath sounds normal. She has no wheezes. She has no rhonchi. She has no rales.  Musculoskeletal:       Right  lower leg: She exhibits no edema.  Neurological: She is alert and oriented to person, place, and time.  Psychiatric: She has a normal mood and affect.  Nursing note and vitals reviewed.      Assessment & Plan:   COPD with acute exacerbation (HCC) Will treat for acute COPD exacerbation Patient Instructions  DepoMedrol given in office today Will refill Proventil - may use every 4 hours as needed Will order doxycycline Will order prednisone taper May take mucinex May take delsym Please go to the ED if symptoms do not improve Follow up with Dr. Delton Coombes in 1 week or sooner if needed        Ivonne Andrew, NP 02/04/2018

## 2018-02-02 NOTE — Patient Instructions (Addendum)
DepoMedrol given in office today Will refill Proventil - may use every 4 hours as needed Will order doxycycline Will order prednisone taper May take mucinex May take delsym Please go to the ED if symptoms do not improve Follow up with Dr. Delton CoombesByrum in 1 week or sooner if needed

## 2018-02-02 NOTE — Telephone Encounter (Signed)
Dr. Clent RidgesFry please advise on changing the medication requested.  Thanks

## 2018-02-02 NOTE — Telephone Encounter (Signed)
Patient was scheduled for 12:00;  with Tanda RockersNichols

## 2018-02-04 ENCOUNTER — Encounter: Payer: Self-pay | Admitting: Nurse Practitioner

## 2018-02-04 DIAGNOSIS — J441 Chronic obstructive pulmonary disease with (acute) exacerbation: Secondary | ICD-10-CM | POA: Insufficient documentation

## 2018-02-04 NOTE — Assessment & Plan Note (Signed)
Will treat for acute COPD exacerbation Patient Instructions  DepoMedrol given in office today Will refill Proventil - may use every 4 hours as needed Will order doxycycline Will order prednisone taper May take mucinex May take delsym Please go to the ED if symptoms do not improve Follow up with Dr. Delton CoombesByrum in 1 week or sooner if needed

## 2018-02-07 MED ORDER — OLANZAPINE 2.5 MG PO TABS: 2.5000 mg | ORAL_TABLET | Freq: Every day | ORAL | 5 refills | Status: DC

## 2018-02-07 NOTE — Addendum Note (Signed)
Addended by: Gershon CraneFRY, Shylo Dillenbeck A on: 02/07/2018 08:26 AM   Modules accepted: Orders

## 2018-02-07 NOTE — Telephone Encounter (Signed)
Stop the Sphris and call in Zyprexa 2.5 mg qhs, #30 with 5 rf

## 2018-02-07 NOTE — Addendum Note (Signed)
Addended by: Marcellus ScottADKINS, Ayub Kirsh L on: 02/07/2018 05:06 PM   Modules accepted: Orders

## 2018-02-07 NOTE — Telephone Encounter (Signed)
zyprexa has been sent to the pharmacy to replace the sphris.

## 2018-02-11 ENCOUNTER — Encounter: Payer: Self-pay | Admitting: Family Medicine

## 2018-02-14 ENCOUNTER — Encounter: Payer: Self-pay | Admitting: Acute Care

## 2018-02-14 ENCOUNTER — Ambulatory Visit (INDEPENDENT_AMBULATORY_CARE_PROVIDER_SITE_OTHER)
Admission: RE | Admit: 2018-02-14 | Discharge: 2018-02-14 | Disposition: A | Discharge status: Home / Self Care | Payer: Medicare HMO | Source: Ambulatory Visit | Attending: Acute Care | Admitting: Acute Care

## 2018-02-14 ENCOUNTER — Ambulatory Visit: Payer: Medicare HMO | Admitting: Acute Care

## 2018-02-14 ENCOUNTER — Telehealth: Payer: Self-pay | Admitting: Acute Care

## 2018-02-14 VITALS — BP 148/94 | HR 96 | Ht 64.0 in | Wt 214.8 lb

## 2018-02-14 DIAGNOSIS — R0602 Shortness of breath: Secondary | ICD-10-CM

## 2018-02-14 DIAGNOSIS — I10 Essential (primary) hypertension: Secondary | ICD-10-CM

## 2018-02-14 DIAGNOSIS — E6609 Other obesity due to excess calories: Secondary | ICD-10-CM

## 2018-02-14 DIAGNOSIS — R079 Chest pain, unspecified: Secondary | ICD-10-CM | POA: Diagnosis not present

## 2018-02-14 DIAGNOSIS — J439 Emphysema, unspecified: Secondary | ICD-10-CM

## 2018-02-14 DIAGNOSIS — J9611 Chronic respiratory failure with hypoxia: Secondary | ICD-10-CM

## 2018-02-14 DIAGNOSIS — Z6832 Body mass index (BMI) 32.0-32.9, adult: Secondary | ICD-10-CM

## 2018-02-14 NOTE — Assessment & Plan Note (Addendum)
Referred to pulmonary rehab

## 2018-02-14 NOTE — Telephone Encounter (Signed)
Please call patient and let her know her CXR did not show pneumonia. Have her follow the plan of care we discussed in theb office today. Thanks so much.

## 2018-02-14 NOTE — Patient Instructions (Addendum)
It is good to see you again. CXR today We will call you with results. EKG today. Continue Symbicort and Spiriva as you have been doing. Continue using your xopenex nebs as needed for wheezing and shortness of breath. Continue Mucinex to help clear up your sputum. Take with a full glass of water. Continue using your Incentive Spirometer We will re-refer you to Pulmonary Rehab.( Dr. Delton CoombesByrum) Your BP was slightly elevated. Please keep record of BP checks at home . If remain high, please follow up with PCP. Follow up in 1 month with Maralyn SagoSarah as is scheduled. Please contact office for sooner follow up if symptoms do not improve or worsen or seek emergency care

## 2018-02-14 NOTE — Assessment & Plan Note (Signed)
Continue wearing oxygen at 2 L to maintain sats 88-92%

## 2018-02-14 NOTE — Assessment & Plan Note (Signed)
Your BP was slightly elevated. Please keep record of BP checks at home . If remain high, please follow up with PCP.

## 2018-02-14 NOTE — Telephone Encounter (Signed)
Advised pt of results. Pt understood and nothing further is needed.   

## 2018-02-14 NOTE — Progress Notes (Signed)
History of Present Illness Laurie Schaefer is a 62 y.o. female  former smoker ( 20 pack years, quit 1992) ,withCOPD,vocal cord dysfunction, ACE related cough,  Bi Polar disease and fibromyalgia. She wears nocturnal oxygen ar 2 L NCand as needed.She is on chronic Norco for pain management ( Managed by Dr. Clent RidgesFry).  She is followed by Dr. Delton CoombesByrum.      02/14/2018  Follow up COPD exacerbation.  Pt.  was seen 02/02/2018 by Angus Selleronya Nichols NP and was treated for COPD exacerbation. She was treated with DepoMedrol injection in the office, and doxycycline, prednisone  taper , mucinex for mucillary clearance . She has been compliant with these acute exacerbation  medications. She took her last dose of Doxycycline 02/13/2018. She has been compliant with her maintenance medications Symbicort, Spiriva, and Xopenex nebs.She states she is tired and remains short of breath.Saturations are 96% on 2 L [place]. She states she feels like she did when she had pneumonia. She does not feel she has gotten much better. Her husband does feel she is better when compared to how she was last week. She has lots of mucus, which is clear to light gray. She continues to endorse dyspnea despite adequate oxygen saturations. She states she feels like she has had a fever, but she has checked with a thermometer which was normal. She does get clammy at times.She does have some chest tenderness mid sternal area, which gets better with ibuprofen. Does not radiate down her arm.She feels it is due to her cough.Cough is productive about 50% of the time. Secretions noted as above.She is also complaining of some dizziness.Blood pressure is a bit high today, but she states it is normal when she is at home.She denies orthopnea of hemoptysis.   Test Results: 02/14/2018 Bronchitic changes with mild bibasilar atelectasis. 02/14/2018 EKG Sinus  Rhythm  WITHIN NORMAL LIMITS QTc 418 ms  02/14/2018 Echo Left ventricle: The cavity size was normal.  Wall thickness was   increased in a pattern of moderate LVH. Systolic function was   vigorous. The estimated ejection fraction was in the range of 65%   to 70%. Wall motion was normal; there were no regional wall   motion abnormalities. Doppler parameters are consistent with   abnormal left ventricular relaxation (grade 1 diastolic   dysfunction). CBC Latest Ref Rng & Units 08/19/2017 07/16/2017 06/29/2017  WBC 4.0 - 10.5 K/uL 6.5 9.1 12.1(H)  Hemoglobin 12.0 - 15.0 g/dL 16.113.1 09.612.6 04.513.8  Hematocrit 36.0 - 46.0 % 38.9 36.5 41.0  Platelets 150 - 400 K/uL 266 328.0 285.0    BMP Latest Ref Rng & Units 08/19/2017 07/16/2017 06/29/2017  Glucose 65 - 99 mg/dL 409(W101(H) 89 89  BUN 6 - 20 mg/dL 13 10 11(B25(H)  Creatinine 0.44 - 1.00 mg/dL 1.470.67 8.290.66 5.620.91  Sodium 135 - 145 mmol/L 142 140 139  Potassium 3.5 - 5.1 mmol/L 3.9 3.7 3.8  Chloride 101 - 111 mmol/L 106 106 102  CO2 22 - 32 mmol/L 28 27 31   Calcium 8.9 - 10.3 mg/dL 9.0 8.5 9.7    BNP    Component Value Date/Time   BNP 24.6 08/19/2017 1107    ProBNP    Component Value Date/Time   PROBNP 74.0 07/16/2017 1549    PFT    Component Value Date/Time   FEV1PRE 1.12 08/30/2017 0957   FEV1POST 1.02 08/30/2017 0957   FVCPRE 1.78 08/30/2017 0957   FVCPOST 1.84 08/30/2017 0957   TLC 4.10 08/30/2017 0957  DLCOUNC 15.88 08/30/2017 0957   PREFEV1FVCRT 63 08/30/2017 0957   PSTFEV1FVCRT 55 08/30/2017 0957    Dg Chest 2 View  Result Date: 02/14/2018 CLINICAL DATA:  Dyspnea, cough, chest pain, intermittent low-grade fever and mucus production for 3 days, history COPD, hypertension, former smoker EXAM: CHEST - 2 VIEW COMPARISON:  11/24/2017 FINDINGS: Normal heart size, mediastinal contours, and pulmonary vascularity. Bronchitic changes with bibasilar atelectasis greater on LEFT. Lungs otherwise clear. No acute infiltrate, pleural effusion or pneumothorax. Biconvex thoracic scoliosis. IMPRESSION: Bronchitic changes with mild bibasilar atelectasis.  Electronically Signed   By: Ulyses Southward M.D.   On: 02/14/2018 13:25     Past medical hx Past Medical History:  Diagnosis Date  . Arthritis   . Chronic neck pain    had previously seen Dr. Salli Real at [facility]   . COPD (chronic obstructive pulmonary disease) (HCC)    sees Dr. Delton Coombes   . Depression   . Fibromyalgia   . Fibromyalgia   . Headache   . Hypertension      Social History   Tobacco Use  . Smoking status: Former Smoker    Packs/day: 0.80    Years: 25.00    Pack years: 20.00    Types: Cigarettes    Last attempt to quit: 03/09/1990    Years since quitting: 27.9  . Smokeless tobacco: Never Used  . Tobacco comment: 1 cig every 2 weeks  Substance Use Topics  . Alcohol use: No    Alcohol/week: 0.0 standard drinks  . Drug use: No    [name] reports that she quit smoking about 27 years ago. Her smoking use included cigarettes. She has a 20.00 pack-year smoking history. She has never used smokeless tobacco. She reports that she does not drink alcohol or use drugs.  Tobacco Cessation: Former smoker, quit 1992, 20 pack year smoking history Past surgical hx, Family hx, Social hx all reviewed.  Current Outpatient Medications on File Prior to Visit  Medication Sig  . albuterol (PROVENTIL HFA;VENTOLIN HFA) 108 (90 Base) MCG/ACT inhaler Inhale 2 puffs into the lungs every 6 (six) hours as needed for wheezing or shortness of breath.  . budesonide-formoterol (SYMBICORT) 160-4.5 MCG/ACT inhaler Inhale 2 puffs into the lungs 2 (two) times daily.  Marland Kitchen buPROPion (WELLBUTRIN XL) 300 MG 24 hr tablet Take 1 tablet (300 mg total) by mouth daily.  . cholecalciferol (VITAMIN D) 1000 units tablet Take 1,000 Units by mouth daily.  Marland Kitchen doxycycline (VIBRA-TABS) 100 MG tablet Take 1 tablet (100 mg total) by mouth 2 (two) times daily.  Marland Kitchen estradiol (ESTRACE) 1 MG tablet Take 1 tablet (1 mg total) by mouth daily.  Marland Kitchen Fexofenadine HCl (MUCINEX ALLERGY PO) Take 1 tablet by mouth daily as  needed (allergies/congestion.).   Marland Kitchen fluticasone (FLONASE) 50 MCG/ACT nasal spray Place 2 sprays into both nostrils daily.  Melene Muller ON 03/05/2018] HYDROcodone-acetaminophen (NORCO) 10-325 MG tablet Take 1 tablet by mouth every 6 (six) hours as needed for moderate pain.  Marland Kitchen ibuprofen (ADVIL,MOTRIN) 800 MG tablet TAKE 1 TABLET BY MOUTH EVERY 6 HOURS AS NEEDED FOR PAIN  . ketorolac (TORADOL) 10 MG tablet Take 10 mg every 6 hours for 2 days, then every 8 hours for 3 more days. Take with food and antacids.  . lamoTRIgine (LAMICTAL) 25 MG tablet Take 1 tablet (25 mg total) by mouth 2 (two) times daily.  Marland Kitchen levalbuterol (XOPENEX) 0.63 MG/3ML nebulizer solution Take 3 mLs (0.63 mg total) by nebulization every 4 (four) hours  as needed for wheezing or shortness of breath.  Marland Kitchen LORazepam (ATIVAN) 2 MG tablet TAKE 1 TABLET(2 MG) BY MOUTH EVERY 8 HOURS AS NEEDED FOR ANXIETY  . losartan-hydrochlorothiazide (HYZAAR) 100-25 MG tablet Take 1 tablet by mouth daily.  . medroxyPROGESTERone (PROVERA) 2.5 MG tablet Take 1 tablet (2.5 mg total) by mouth daily.  Marland Kitchen OLANZapine (ZYPREXA) 2.5 MG tablet Take 1 tablet (2.5 mg total) by mouth at bedtime.  . OXYGEN 3lpm with sleep and exertion if needed  . pantoprazole (PROTONIX) 40 MG tablet Take 1 tablet (40 mg total) by mouth daily.  . QUEtiapine (SEROQUEL) 25 MG tablet TAKE 1 TABLET BY MOUTH EVERY 6 HOURS AS NEEDED  . Tiotropium Bromide Monohydrate (SPIRIVA RESPIMAT) 2.5 MCG/ACT AERS Inhale 2 puffs into the lungs daily.  Marland Kitchen zolpidem (AMBIEN) 10 MG tablet TAKE 1 TABLET BY MOUTH EVERY NIGHT AT BEDTIME AS NEEDED SLEEP  . predniSONE (DELTASONE) 10 MG tablet Take 4 tabs for 2 days, then 3 tabs for 2 days, 2 tabs for 2 days, then 1 tab for 2 days, then stop. (Patient not taking: Reported on 02/14/2018)  . predniSONE (DELTASONE) 10 MG tablet Take 4 tabs for 2 days, then 3 tabs for 2 days, then 2 tabs for 2 days, then 1 tab for 2 days, then stop (Patient not taking: Reported on 02/14/2018)     No current facility-administered medications on file prior to visit.      Allergies  Allergen Reactions  . Codeine Nausea Only    Review Of Systems:  Constitutional:   No  weight loss, night sweats,  Fevers, chills, + fatigue, or  lassitude.  HEENT:   No headaches,  Difficulty swallowing,  Tooth/dental problems, or  Sore throat,                No sneezing, itching, ear ache, nasal congestion, post nasal drip,   CV:  + chest pain with cough,  Orthopnea, PND, swelling in lower extremities, anasarca, dizziness, palpitations, syncope.   GI  No heartburn, indigestion, abdominal pain, nausea, vomiting, diarrhea, change in bowel habits, loss of appetite, bloody stools.   Resp: + shortness of breath with exertion less at rest.  + excess mucus, + productive cough,  No non-productive cough,  No coughing up of blood.  no change in color of mucus.  + wheezing.  No chest wall deformity  Skin: no rash or lesions, intact.  GU: no dysuria, change in color of urine, no urgency or frequency.  No flank pain, no hematuria   MS:  No joint pain or swelling.  No decreased range of motion.  No back pain.  Psych:  No change in mood or affect. No depression or + anxiety.  No memory loss.   Vital Signs BP (!) 148/94 (BP Location: Left Arm, Cuff Size: Normal)   Pulse 96   Ht 5\' 4"  (1.626 m)   Wt 214 lb 12.8 oz (97.4 kg)   SpO2 96%   BMI 36.87 kg/m    Physical Exam:  General- No distress,  A&Ox3, pleasant ENT: No sinus tenderness, TM clear, pale nasal mucosa, no oral exudate,no post nasal drip, no LAN Cardiac: S1, S2, regular rate and rhythm, no murmur Chest: No wheeze/ rales/ dullness; no accessory muscle use, no nasal flaring, no sternal retractions Abd.: Soft Non-tender, ND, BS +, Body mass index is 36.87 kg/m. Ext: No clubbing cyanosis, edema Neuro:  normal strength, A&O x 3, MAE x 4, physical deconditioning Skin: No rashes, warm and  dry, intact Psych: normal mood and behavior,  anxious   Assessment/Plan  Chronic respiratory failure with hypoxia (HCC) Continue wearing oxygen at 2 L to maintain sats 88-92%  COPD (chronic obstructive pulmonary disease) (HCC) Resolving COPD exacerbation Treated with Doxycycline and pred taper CXR clear No LE edema or leg pain Non-specific chest pain 2/2 cough>> improves with ibuprofen Plan: CXR today We will call you with results. EKG today. Continue Symbicort and Spiriva as you have been doing. Continue using your xopenex nebs as needed for wheezing and shortness of breath. Continue Mucinex to help clear up your sputum. Take with a full glass of water. Continue using your Incentive Spirometer We will re-refer you to Pulmonary Rehab.( Dr. Delton Coombes) Follow up in 1 month with Maralyn Sago as is scheduled. Please contact office for sooner follow up if symptoms do not improve or worsen or seek emergency care   Obesity Referred to pulmonary rehab  Essential hypertension Your BP was slightly elevated. Please keep record of BP checks at home . If remain high, please follow up with PCP.    Bevelyn Ngo, NP 02/14/2018  10:33 PM

## 2018-02-14 NOTE — Assessment & Plan Note (Addendum)
Resolving COPD exacerbation Treated with Doxycycline and pred taper CXR clear No LE edema or leg pain Non-specific chest pain 2/2 cough>> improves with ibuprofen Plan: CXR today We will call you with results. EKG today. Continue Symbicort and Spiriva as you have been doing. Continue using your xopenex nebs as needed for wheezing and shortness of breath. Continue Mucinex to help clear up your sputum. Take with a full glass of water. Continue using your Incentive Spirometer We will re-refer you to Pulmonary Rehab.( Dr. Delton CoombesByrum) Follow up in 1 month with Laurie Schaefer as is scheduled. Please contact office for sooner follow up if symptoms do not improve or worsen or seek emergency care

## 2018-02-17 MED ORDER — OLANZAPINE 10 MG PO TABS: 10.0000 mg | ORAL_TABLET | Freq: Every day | ORAL | 2 refills | Status: DC

## 2018-02-17 NOTE — Telephone Encounter (Signed)
Cancel Sapphris. Call in Zyprexa 10 mg qhs, #30 with 2 rf

## 2018-02-28 ENCOUNTER — Encounter: Payer: Self-pay | Admitting: Family Medicine

## 2018-03-10 ENCOUNTER — Ambulatory Visit: Payer: Medicare HMO | Admitting: Primary Care

## 2018-03-10 ENCOUNTER — Ambulatory Visit: Payer: Medicare HMO | Admitting: Acute Care

## 2018-03-14 ENCOUNTER — Ambulatory Visit: Payer: Medicare HMO | Admitting: Acute Care

## 2018-03-15 NOTE — Telephone Encounter (Signed)
Patient sent a message to let you know that she is not having symptoms at this time, so she cancelled her appointment.    Will route message to Evette Doffing, as Lorain Childes

## 2018-03-16 ENCOUNTER — Ambulatory Visit: Payer: Medicare HMO | Admitting: Acute Care

## 2018-04-03 ENCOUNTER — Encounter: Payer: Self-pay | Admitting: Family Medicine

## 2018-04-04 NOTE — Telephone Encounter (Signed)
Dr. Fry please advise. Thanks  

## 2018-04-05 MED ORDER — AMLODIPINE BESYLATE 5 MG PO TABS: 5.0000 mg | ORAL_TABLET | Freq: Every day | ORAL | 2 refills | Status: DC

## 2018-04-05 NOTE — Telephone Encounter (Signed)
Just take one Losartan HCT a day, but we will add Amlodipine 5 mg daily to this. Call in #30 with 2 rf. She should see [place] in 3-4 weeks after that

## 2018-06-08 ENCOUNTER — Other Ambulatory Visit: Payer: Self-pay

## 2018-06-08 MED ORDER — LEVALBUTEROL HCL 0.63 MG/3ML IN NEBU: 0.6300 mg | INHALATION_SOLUTION | RESPIRATORY_TRACT | 1 refills | Status: DC | PRN

## 2018-06-08 MED ORDER — TIOTROPIUM BROMIDE MONOHYDRATE 2.5 MCG/ACT IN AERS: 2.0000 | INHALATION_SPRAY | Freq: Every day | RESPIRATORY_TRACT | 1 refills | Status: DC

## 2018-06-08 NOTE — Telephone Encounter (Signed)
Received fax, Laurie Schaefer is requesting a new Rx of Symbicort and Spiriva. Sent in new Rx's. Nothing further is needed.

## 2018-06-10 ENCOUNTER — Telehealth: Payer: Self-pay | Admitting: Acute Care

## 2018-06-10 NOTE — Telephone Encounter (Signed)
Called [employer] spoke with [employer]. Message placed in error. Message should have been placed at Dr. Elberta Fortis office. Disregard message Message closed.

## 2018-06-13 ENCOUNTER — Telehealth: Payer: Self-pay | Admitting: Family Medicine

## 2018-06-13 NOTE — Telephone Encounter (Signed)
Dr. Clent Ridges please advise on refills of requested meds.  Thanks

## 2018-06-13 NOTE — Telephone Encounter (Signed)
Copied from CRM [id number]. Topic: Quick Communication - Rx Refill/Question >> Jun 13, 2018  9:52 AM Lynne Logan D wrote: Medication: QUEtiapine (SEROQUEL) 25 MG tablet / losartan-hydrochlorothiazide (HYZAAR) 100-25 MG tablet / OLANZapine (ZYPREXA) 10 MG tablet/ pantoprazole (PROTONIX) 40 MG tablet / estradiol (ESTRACE) 1 MG tablet/ amLODipine (NORVASC) 5 MG tablet / buPROPion (WELLBUTRIN XL) 300 MG 24 hr tablet / zolpidem (AMBIEN) 10 MG tablet/ LORazepam (ATIVAN) 2 MG tablet / predniSONE (DELTASONE) 10 MG tablet  Has the patient contacted their pharmacy? Yes.   (Agent: If no, request that the patient contact the pharmacy for the refill.) (Agent: If yes, when and what did the pharmacy advise?)  Preferred Pharmacy (with phone number or street name): [facility] - [place], [place] - [address] [phone] (Phone) [phone] (Fax)    Agent: Please be advised that RX refills may take up to 3 business days. We ask that you follow-up with your pharmacy.

## 2018-06-14 MED ORDER — ZOLPIDEM TARTRATE 10 MG PO TABS: ORAL_TABLET | ORAL | 1 refills | Status: DC

## 2018-06-14 MED ORDER — MEDROXYPROGESTERONE ACETATE 2.5 MG PO TABS: 2.5000 mg | ORAL_TABLET | Freq: Every day | ORAL | 3 refills | Status: DC

## 2018-06-14 MED ORDER — QUETIAPINE FUMARATE 25 MG PO TABS: 25.0000 mg | ORAL_TABLET | Freq: Four times a day (QID) | ORAL | 1 refills | Status: DC | PRN

## 2018-06-14 MED ORDER — LOSARTAN POTASSIUM-HCTZ 100-25 MG PO TABS: 1.0000 | ORAL_TABLET | Freq: Every day | ORAL | 3 refills | Status: DC

## 2018-06-14 MED ORDER — PANTOPRAZOLE SODIUM 40 MG PO TBEC: 40.0000 mg | DELAYED_RELEASE_TABLET | Freq: Every day | ORAL | 3 refills | Status: DC

## 2018-06-14 MED ORDER — BUPROPION HCL ER (XL) 300 MG PO TB24: 300.0000 mg | ORAL_TABLET | Freq: Every day | ORAL | 3 refills | Status: DC

## 2018-06-14 MED ORDER — LAMOTRIGINE 25 MG PO TABS: 25.0000 mg | ORAL_TABLET | Freq: Two times a day (BID) | ORAL | 3 refills | Status: DC

## 2018-06-14 MED ORDER — OLANZAPINE 10 MG PO TABS: 10.0000 mg | ORAL_TABLET | Freq: Every day | ORAL | 3 refills | Status: DC

## 2018-06-14 MED ORDER — LORAZEPAM 2 MG PO TABS: ORAL_TABLET | ORAL | 1 refills | Status: DC

## 2018-06-14 MED ORDER — AMLODIPINE BESYLATE 5 MG PO TABS: 5.0000 mg | ORAL_TABLET | Freq: Every day | ORAL | 3 refills | Status: DC

## 2018-06-14 MED ORDER — ESTRADIOL 1 MG PO TABS: 1.0000 mg | ORAL_TABLET | Freq: Every day | ORAL | 3 refills | Status: DC

## 2018-06-14 NOTE — Telephone Encounter (Signed)
Done

## 2018-06-21 ENCOUNTER — Other Ambulatory Visit: Payer: Self-pay | Admitting: *Deleted

## 2018-06-21 MED ORDER — BUDESONIDE-FORMOTEROL FUMARATE 160-4.5 MCG/ACT IN AERO: 2.0000 | INHALATION_SPRAY | Freq: Two times a day (BID) | RESPIRATORY_TRACT | 0 refills | Status: DC

## 2018-06-21 MED ORDER — LEVALBUTEROL HCL 0.63 MG/3ML IN NEBU: 0.6300 mg | INHALATION_SOLUTION | RESPIRATORY_TRACT | 0 refills | Status: DC | PRN

## 2018-06-21 MED ORDER — TIOTROPIUM BROMIDE MONOHYDRATE 2.5 MCG/ACT IN AERS: 2.0000 | INHALATION_SPRAY | Freq: Every day | RESPIRATORY_TRACT | 0 refills | Status: DC

## 2018-06-23 ENCOUNTER — Encounter: Payer: Self-pay | Admitting: Family Medicine

## 2018-07-01 ENCOUNTER — Ambulatory Visit: Payer: Medicare HMO | Admitting: Family Medicine

## 2018-07-01 ENCOUNTER — Ambulatory Visit (INDEPENDENT_AMBULATORY_CARE_PROVIDER_SITE_OTHER): Payer: Medicare HMO | Admitting: Family Medicine

## 2018-07-01 ENCOUNTER — Encounter: Payer: Self-pay | Admitting: Family Medicine

## 2018-07-01 ENCOUNTER — Other Ambulatory Visit: Payer: Self-pay

## 2018-07-01 DIAGNOSIS — M797 Fibromyalgia: Secondary | ICD-10-CM

## 2018-07-01 DIAGNOSIS — F1190 Opioid use, unspecified, uncomplicated: Secondary | ICD-10-CM

## 2018-07-01 MED ORDER — HYDROCODONE-ACETAMINOPHEN 10-325 MG PO TABS: 1.0000 | ORAL_TABLET | Freq: Four times a day (QID) | ORAL | 0 refills | Status: DC | PRN

## 2018-07-01 MED ORDER — HYDROCODONE-ACETAMINOPHEN 10-325 MG PO TABS: 1.0000 | ORAL_TABLET | Freq: Four times a day (QID) | ORAL | 0 refills | Status: AC | PRN

## 2018-07-01 NOTE — Progress Notes (Signed)
Subjective:    Patient ID: Laurie Schaefer, female    DOB: [birth year], 63 y.o.   MRN: [id number]  HPI Virtual Visit via Video Note  I connected with the patient on 07/01/18 at  9:30 AM EDT by a video enabled telemedicine application and verified that I am speaking with the correct person using two identifiers.  Location patient: home Location provider:work or home office Persons participating in the virtual visit: patient, provider  I discussed the limitations of evaluation and management by telemedicine and the availability of in person appointments. The patient expressed understanding and agreed to proceed.   HPI: Here for pain management. She has been doing well.  Indication for chronic opioid: fibromyalgia Medication and dose: Norco 10-325 # pills per month: 120 Last UDS date: 04-30-17 Opioid Treatment Agreement signed (Y/N): 04-30-17 Opioid Treatment Agreement last reviewed with patient:  07-01-18 N[facility]SRS reviewed this encounter (include red flags):  07-01-18    ROS: See pertinent positives and negatives per HPI.  Past Medical History:  Diagnosis Date  . Arthritis   . Chronic neck pain    had previously seen Dr. Salli Real at [facility]   . COPD (chronic obstructive pulmonary disease) (HCC)    sees Dr. Delton Coombes   . Depression   . Fibromyalgia   . Fibromyalgia   . Headache   . Hypertension     Past Surgical History:  Procedure Laterality Date  . FRACTURE SURGERY      History reviewed. No pertinent family history.   Current Outpatient Medications:  .  albuterol (PROVENTIL HFA;VENTOLIN HFA) 108 (90 Base) MCG/ACT inhaler, Inhale 2 puffs into the lungs every 6 (six) hours as needed for wheezing or shortness of breath., Disp: 1 Inhaler, Rfl: 6 .  amLODipine (NORVASC) 5 MG tablet, Take 1 tablet (5 mg total) by mouth daily., Disp: 90 tablet, Rfl: 3 .  budesonide-formoterol (SYMBICORT) 160-4.5 MCG/ACT inhaler, Inhale 2 puffs into the lungs 2 (two) times  daily., Disp: 3 Inhaler, Rfl: 0 .  buPROPion (WELLBUTRIN XL) 300 MG 24 hr tablet, Take 1 tablet (300 mg total) by mouth daily., Disp: 90 tablet, Rfl: 3 .  cholecalciferol (VITAMIN D) 1000 units tablet, Take 1,000 Units by mouth daily., Disp: , Rfl:  .  estradiol (ESTRACE) 1 MG tablet, Take 1 tablet (1 mg total) by mouth daily., Disp: 90 tablet, Rfl: 3 .  Fexofenadine HCl (MUCINEX ALLERGY PO), Take 1 tablet by mouth daily as needed (allergies/congestion.). , Disp: , Rfl:  .  fluticasone (FLONASE) 50 MCG/ACT nasal spray, Place 2 sprays into both nostrils daily., Disp: 16 g, Rfl: 5 .  ibuprofen (ADVIL,MOTRIN) 800 MG tablet, TAKE 1 TABLET BY MOUTH EVERY 6 HOURS AS NEEDED FOR PAIN, Disp: 120 tablet, Rfl: 5 .  ketorolac (TORADOL) 10 MG tablet, Take 10 mg every 6 hours for 2 days, then every 8 hours for 3 more days. Take with food and antacids., Disp: 17 tablet, Rfl: 0 .  lamoTRIgine (LAMICTAL) 25 MG tablet, Take 1 tablet (25 mg total) by mouth 2 (two) times daily., Disp: 180 tablet, Rfl: 3 .  levalbuterol (XOPENEX) 0.63 MG/3ML nebulizer solution, Take 3 mLs (0.63 mg total) by nebulization every 4 (four) hours as needed for wheezing or shortness of breath., Disp: 810 mL, Rfl: 0 .  LORazepam (ATIVAN) 2 MG tablet, TAKE 1 TABLET(2 MG) BY MOUTH EVERY 8 HOURS AS NEEDED FOR ANXIETY, Disp: 270 tablet, Rfl: 1 .  losartan (COZAAR) 50 MG tablet, , Disp: , Rfl:  .  losartan-hydrochlorothiazide (HYZAAR) 100-25 MG tablet, Take 1 tablet by mouth daily., Disp: 90 tablet, Rfl: 3 .  medroxyPROGESTERone (PROVERA) 2.5 MG tablet, Take 1 tablet (2.5 mg total) by mouth daily., Disp: 90 tablet, Rfl: 3 .  OLANZapine (ZYPREXA) 10 MG tablet, Take 1 tablet (10 mg total) by mouth at bedtime., Disp: 90 tablet, Rfl: 3 .  OXYGEN, 3lpm with sleep and exertion if needed, Disp: , Rfl:  .  pantoprazole (PROTONIX) 40 MG tablet, Take 1 tablet (40 mg total) by mouth daily., Disp: 90 tablet, Rfl: 3 .  QUEtiapine (SEROQUEL) 25 MG tablet, Take 1  tablet (25 mg total) by mouth every 6 (six) hours as needed., Disp: 360 tablet, Rfl: 1 .  Tiotropium Bromide Monohydrate (SPIRIVA RESPIMAT) 2.5 MCG/ACT AERS, Inhale 2 puffs into the lungs daily., Disp: 3 Inhaler, Rfl: 0 .  zolpidem (AMBIEN) 10 MG tablet, TAKE 1 TABLET BY MOUTH EVERY NIGHT AT BEDTIME AS NEEDED SLEEP, Disp: 90 tablet, Rfl: 1 .  [START ON 08/31/2018] HYDROcodone-acetaminophen (NORCO) 10-325 MG tablet, Take 1 tablet by mouth every 6 (six) hours as needed for up to 30 days for moderate pain., Disp: 120 tablet, Rfl: 0  EXAM:  VITALS per patient if applicable:  GENERAL: alert, oriented, appears well and in no acute distress  HEENT: atraumatic, conjunttiva clear, no obvious abnormalities on inspection of external nose and ears  NECK: normal movements of the head and neck  LUNGS: on inspection no signs of respiratory distress, breathing rate appears normal, no obvious gross SOB, gasping or wheezing  CV: no obvious cyanosis  MS: moves all visible extremities without noticeable abnormality  PSYCH/NEURO: pleasant and cooperative, no obvious depression or anxiety, speech and thought processing grossly intact  ASSESSMENT AND PLAN: Pain management, meds were refilled. Gershon CraneStephen , MD  Discussed the following assessment and plan:  No diagnosis found.     I discussed the assessment and treatment plan with the patient. The patient was provided an opportunity to ask questions and all were answered. The patient agreed with the plan and demonstrated an understanding of the instructions.   The patient was advised to call back or seek an in-person evaluation if the symptoms worsen or if the condition fails to improve as anticipated.     Review of Systems     Objective:   Physical Exam        Assessment & Plan:

## 2018-07-20 ENCOUNTER — Other Ambulatory Visit: Payer: Self-pay | Admitting: Family Medicine

## 2018-08-03 ENCOUNTER — Encounter: Payer: Self-pay | Admitting: Family Medicine

## 2018-08-03 NOTE — Telephone Encounter (Signed)
Dr. Clent Ridges please advise if we need to do the referral or just a virtual visit at this time.

## 2018-08-04 DIAGNOSIS — J9611 Chronic respiratory failure with hypoxia: Secondary | ICD-10-CM | POA: Diagnosis not present

## 2018-08-04 NOTE — Telephone Encounter (Signed)
I recommend [facility], she can call [phone] to make an appt

## 2018-08-05 DIAGNOSIS — J9611 Chronic respiratory failure with hypoxia: Secondary | ICD-10-CM | POA: Diagnosis not present

## 2018-08-05 DIAGNOSIS — J301 Allergic rhinitis due to pollen: Secondary | ICD-10-CM | POA: Diagnosis not present

## 2018-08-05 DIAGNOSIS — J439 Emphysema, unspecified: Secondary | ICD-10-CM | POA: Diagnosis not present

## 2018-08-05 DIAGNOSIS — F319 Bipolar disorder, unspecified: Secondary | ICD-10-CM | POA: Diagnosis not present

## 2018-09-04 DIAGNOSIS — J9611 Chronic respiratory failure with hypoxia: Secondary | ICD-10-CM | POA: Diagnosis not present

## 2018-09-05 DIAGNOSIS — J301 Allergic rhinitis due to pollen: Secondary | ICD-10-CM | POA: Diagnosis not present

## 2018-09-05 DIAGNOSIS — J439 Emphysema, unspecified: Secondary | ICD-10-CM | POA: Diagnosis not present

## 2018-09-05 DIAGNOSIS — J9611 Chronic respiratory failure with hypoxia: Secondary | ICD-10-CM | POA: Diagnosis not present

## 2018-09-05 DIAGNOSIS — F319 Bipolar disorder, unspecified: Secondary | ICD-10-CM | POA: Diagnosis not present

## 2018-10-04 DIAGNOSIS — J9611 Chronic respiratory failure with hypoxia: Secondary | ICD-10-CM | POA: Diagnosis not present

## 2018-10-05 DIAGNOSIS — J301 Allergic rhinitis due to pollen: Secondary | ICD-10-CM | POA: Diagnosis not present

## 2018-10-05 DIAGNOSIS — J439 Emphysema, unspecified: Secondary | ICD-10-CM | POA: Diagnosis not present

## 2018-10-05 DIAGNOSIS — F319 Bipolar disorder, unspecified: Secondary | ICD-10-CM | POA: Diagnosis not present

## 2018-10-05 DIAGNOSIS — J9611 Chronic respiratory failure with hypoxia: Secondary | ICD-10-CM | POA: Diagnosis not present

## 2018-10-15 ENCOUNTER — Other Ambulatory Visit: Payer: Self-pay | Admitting: Family Medicine

## 2018-10-17 NOTE — Telephone Encounter (Signed)
See my answer  

## 2018-10-17 NOTE — Telephone Encounter (Signed)
Both last filled 06/14/2018  Last OV 07/01/2018  Ok to fill?

## 2018-10-17 NOTE — Telephone Encounter (Signed)
She already has enough refills to last until 12-14-18

## 2018-10-20 ENCOUNTER — Encounter: Payer: Self-pay | Admitting: Family Medicine

## 2018-10-21 NOTE — Telephone Encounter (Signed)
ambien last filled 06/14/2018 Lorazepam last filled 06/14/2018  Last OV 07/01/2018  Ok to fill?

## 2018-10-21 NOTE — Telephone Encounter (Signed)
She already has refills until October 7

## 2018-10-25 ENCOUNTER — Other Ambulatory Visit: Payer: Self-pay | Admitting: Family Medicine

## 2018-10-26 NOTE — Telephone Encounter (Signed)
Lorazepam last filled 06/14/2018 Zolpidem last filled 06/14/2018  Last OV 07/01/2018  Ok to fill.

## 2018-11-04 DIAGNOSIS — J9611 Chronic respiratory failure with hypoxia: Secondary | ICD-10-CM | POA: Diagnosis not present

## 2018-11-05 DIAGNOSIS — J9611 Chronic respiratory failure with hypoxia: Secondary | ICD-10-CM | POA: Diagnosis not present

## 2018-11-05 DIAGNOSIS — J439 Emphysema, unspecified: Secondary | ICD-10-CM | POA: Diagnosis not present

## 2018-11-05 DIAGNOSIS — F319 Bipolar disorder, unspecified: Secondary | ICD-10-CM | POA: Diagnosis not present

## 2018-11-05 DIAGNOSIS — J301 Allergic rhinitis due to pollen: Secondary | ICD-10-CM | POA: Diagnosis not present

## 2018-11-28 ENCOUNTER — Other Ambulatory Visit: Payer: Self-pay | Admitting: Family Medicine

## 2018-12-01 ENCOUNTER — Ambulatory Visit: Payer: Medicare HMO

## 2018-12-01 ENCOUNTER — Telehealth: Payer: Self-pay

## 2018-12-01 NOTE — Telephone Encounter (Signed)
Copied from [place] [id number]. Topic: General - Inquiry >> Nov 29, 2018  8:55 AM Oneta Rack wrote: Patient cancelled flu shot appointment scheduled for 12/01/2018, due to [facility] not having any availability that worked for patient. Patient was scheduled at [facility] flu clinic (location is in walking distance from patient) . Patient would also like  pneumonia shot requesting orders be placed at [facility] for 10/1. >> Nov 29, 2018 11:29 AM Oneta Rack wrote: LVM advising the patient unable to receive pneumonia shot scheduled for 12/08/2018 at the [facility] location. Patient appointment [facility] to 01/05/2019 due the next available slot. When patient return call please confirm the new date and time.  >> Nov 29, 2018  9:31 AM Cox, Melburn Hake, CMA wrote: Please advise on pna vaccine.   NOTED!

## 2018-12-05 DIAGNOSIS — J9611 Chronic respiratory failure with hypoxia: Secondary | ICD-10-CM | POA: Diagnosis not present

## 2018-12-06 DIAGNOSIS — J9611 Chronic respiratory failure with hypoxia: Secondary | ICD-10-CM | POA: Diagnosis not present

## 2018-12-06 DIAGNOSIS — F319 Bipolar disorder, unspecified: Secondary | ICD-10-CM | POA: Diagnosis not present

## 2018-12-06 DIAGNOSIS — J301 Allergic rhinitis due to pollen: Secondary | ICD-10-CM | POA: Diagnosis not present

## 2018-12-06 DIAGNOSIS — J439 Emphysema, unspecified: Secondary | ICD-10-CM | POA: Diagnosis not present

## 2018-12-08 ENCOUNTER — Other Ambulatory Visit: Payer: Self-pay

## 2018-12-08 ENCOUNTER — Telehealth: Payer: Self-pay | Admitting: Acute Care

## 2018-12-08 ENCOUNTER — Ambulatory Visit (INDEPENDENT_AMBULATORY_CARE_PROVIDER_SITE_OTHER): Payer: Medicare HMO | Admitting: Pulmonary Disease

## 2018-12-08 ENCOUNTER — Ambulatory Visit: Payer: Medicare HMO

## 2018-12-08 ENCOUNTER — Encounter: Payer: Self-pay | Admitting: Pulmonary Disease

## 2018-12-08 DIAGNOSIS — Z9119 Patient's noncompliance with other medical treatment and regimen: Secondary | ICD-10-CM

## 2018-12-08 DIAGNOSIS — J439 Emphysema, unspecified: Secondary | ICD-10-CM

## 2018-12-08 DIAGNOSIS — R0602 Shortness of breath: Secondary | ICD-10-CM

## 2018-12-08 DIAGNOSIS — R0789 Other chest pain: Secondary | ICD-10-CM | POA: Insufficient documentation

## 2018-12-08 DIAGNOSIS — J441 Chronic obstructive pulmonary disease with (acute) exacerbation: Secondary | ICD-10-CM | POA: Diagnosis not present

## 2018-12-08 DIAGNOSIS — J9611 Chronic respiratory failure with hypoxia: Secondary | ICD-10-CM

## 2018-12-08 DIAGNOSIS — Z91199 Patient's noncompliance with other medical treatment and regimen due to unspecified reason: Secondary | ICD-10-CM

## 2018-12-08 NOTE — Assessment & Plan Note (Signed)
Plan: I would like for you to seek emergent evaluation at urgent care or an emergency room as I believe you may be having a COPD exacerbation that could be directly related to a viral exposures such as SARS-CoV-2.  I believe you need a physical in person assessment.  Please proceed to an emergency room such as [facility] or [facility].  Or you can present to [facility] urgent care.  I have provided their telephone number to the patient.  Please continue to take your Symbicort 160 Please continue take your Spiriva Respimat 2.5 Follow-up with our office in 4 weeks

## 2018-12-08 NOTE — Progress Notes (Signed)
Virtual Visit via Telephone Note  I connected with Laurie Schaefer on 12/08/18 at  3:45 PM EDT by telephone and verified that I am speaking with the correct person using two identifiers.  Location: Patient: Home Provider: Office [facility] - [address] [place], [address], [place], [place] [address]   I discussed the limitations, risks, security and privacy concerns of performing an evaluation and management service by telephone and the availability of in person appointments. I also discussed with the patient that there may be a patient responsible charge related to this service. The patient expressed understanding and agreed to proceed.  Patient consented to consult via telephone: Yes People present and their role in pt care: Pt   History of Present Illness:  Laurie Schaefer is a 63 y.o. female former smoker ( 20 pack years, quit 1992),withCOPD,vocal cord dysfunction,ACE related cough, Bipolar disease and fibromyalgia. She wears nocturnal oxygen ar 2 L NCand as needed.She is on chronic Norco for pain management ( Managed by Dr. Clent Ridges).  She is followed by Dr. Delton Coombes.  Chief complaint: Shortness of breath, wheezing, increased congestion, patient feels that she has a temperature  63 year old female former smoker followed in our office for COPD.  Patient completing a telephone visit with our office because she contacted our office reporting she is had worsened symptoms over the last 2 to 3 days.  Patient reports that her symptoms started a few days ago but really worsened yesterday morning.  She has had increased cough, congestion, increased mucus production.  Mucus is clear.  She is having to use her rescue inhaler 2 times daily.  She is adherent to her Symbicort 160 as well as her Spiriva Respimat 2.5.  Patient informed me that she has had temperatures.  When I inquired what the numbers were as far as for her temperature she said she could not tell me because she did not use a  thermometer.  She reports that she assessed her temperature with her hand and thinks that it was around "99".  When I asked patient to check her temperature over the phone with me with the thermometer that she has at home her temperature was 98.3.  She also reports that she is been having chills, body aches and clammy-like symptoms.  She reports that she has been out in the community but she has been wearing a mask.  She continues to wear 3 L of oxygen she reports her oxygen saturations remain above 90%.  The entire telephone conversation patient was grimacing in pain very hectic with lots of background noise.  Patient reports that she is having a severe amount of chest pain.  She believes that is from coughing.  She also had nonspecific chest pain back in December/2019.  Unfortunately, the patient has not had routine follow-up with our office.  She was last seen in our office in December/2019.  This was also an acute visit.  She was instructed to follow-up in 1 month which she reports she did not do because she was "feeling better".   Patient is followed by primary care and maintained on chronic pain medications for fibromyalgia.  Observations/Objective:  12/08/2018 - temp - 98.3  Grimacing in pain on entire phone call. Very hectic conversation telephonically with lots of background noise.  Patient also slurring words on phone.  07/16/2017-respiratory allergy panel-elevations to [place], IgE 22 07/16/2017-CBC with differential- eosinophils relative 1.2, eosinophils absolute 0.1  02/14/2018-chest x-ray-bronchitic changes with mild bibasilar atelectasis  08/19/2017-CTA chest- no  evidence of PE, no acute cardiopulmonary disease  08/31/2018-pulmonary function test- FVC 1.78 (56% predicted), postbronchodilator ratio 55, postbronchodilator FEV1 1.02 (42% predicted), no bronchodilator response, mid flow reversibility, DLCO 15.88 (67% predicted)  08/25/2017-echocardiogram-LV ejection fraction 65 to 70%,  moderate LVH, grade 1 diastolic dysfunction  Assessment and Plan:  Discussion: Very erratic telephone exam today.  Patient was struggling and slurring her words occasionally.  I believe this may be likely related to her chronic narcotic use.  Unfortunately patient is also reporting worsened chest pain as well as acute shortness of breath, congestion, cough.  I believe she needs to seek emergent evaluation.  I have recommended for her to proceed forward to an emergency room.  She is extremely apprehensive to this.  [facility] emergency [facility].  She reports that she would like to go to an urgent care instead.  I then recommended [facility] urgent care for her to contact their him before presenting so that way they can route her to the appropriate location if they do not feel that she is stable enough to be evaluated in urgent care.  I recommended for her to present to [facility] urgent care versus [facility] so that way we can see this lab work, notes as well as imaging if they do proceed forward with that.  I do not feel comfortable with managing this patient telephonically at or empirically treating her as a COPD exacerbation.  Likely need to also consider in the differential SARS-CoV-2 as patient has been out in the community and has worsened respiratory symptoms.  Chest wall pain needs to be further evaluated with likely an EKG, lab work or imaging.  Once again patient needs to present to an emergency room or urgent care for in person evaluation of her symptoms.  Patient agreed and said that she will present to an emergency room or urgent care after she gets off the phone.  Chronic respiratory failure with hypoxia (HCC) Plan: Continue to wear oxygen as prescribed Maintain oxygen saturations greater than 90% Follow-up with our office in 4 weeks  COPD (chronic obstructive pulmonary disease) (HCC) Plan: I would like for you to seek emergent evaluation at  urgent care or an emergency room as I believe you may be having a COPD exacerbation that could be directly related to a viral exposures such as SARS-CoV-2.  I believe you need a physical in person assessment.  Please proceed to an emergency room such as [facility] or [facility].  Or you can present to [facility] urgent care.  I have provided their telephone number to the patient.  Please continue to take your Symbicort 160 Please continue take your Spiriva Respimat 2.5 Follow-up with our office in 4 weeks    COPD with acute exacerbation (HCC) Likely COPD exacerbation.  I am concerned regarding the patient's persistent chest wall pain.  She is grimacing and active and in a significant amount of pain throughout the entire telephone call.  I believe she needs to have acute in person evaluation.  I recommended for her to proceed to an emergency room.  She was very resistant to this.  She said that she will go to an urgent care.  I have provided her the telephone number to [facility] urgent care.  I have recommended that she call their office prior to presenting as they also may recommend for her to go to an emergency room  I emphasized to the patient multiple times  that she needs to seek emergent evaluation for her symptoms.  We cannot treat the symptoms telephonically based off of the phone conversation and phone assessment that I have had.  She needs an in person evaluation with physical assessment.  Likely will need blood work to rule out other concerns for chest wall pain and chest pain.  Chest wall pain Plan: Patient needs to present to the emergency room or urgent care for further evaluation and in person evaluation  Noncompliance Emphasized repeatedly to the patient that she needs to be compliant with follow-up with our office.  She cannot see if simply present to our office anytime she is having acute visits.  We need to also see her when she is stable and doing intermittently well.  She  reports that she did not understand that she was post to follow-up with [place] when she felt good.  Plan: We will get patient scheduled for a 4-week follow-up in our office.  Patient would like for this to be with Eric Form, NP   Follow Up Instructions:  Return in about 4 weeks (around 01/05/2019), or if symptoms worsen or fail to improve, for Follow up with Eric Form ACAGNP-BC.   I discussed the assessment and treatment plan with the patient. The patient was provided an opportunity to ask questions and all were answered. The patient agreed with the plan and demonstrated an understanding of the instructions.   The patient was advised to call back or seek an in-person evaluation if the symptoms worsen or if the condition fails to improve as anticipated.  I provided 28 minutes of non-face-to-face time during this encounter.   Lauraine Rinne, NP

## 2018-12-08 NOTE — Assessment & Plan Note (Signed)
Plan: Continue to wear oxygen as prescribed Maintain oxygen saturations greater than 90% Follow-up with our office in 4 weeks

## 2018-12-08 NOTE — Patient Instructions (Addendum)
You were seen today by Lauraine Rinne, NP  for:   1. Shortness of breath  Concerned that you need to be potentially evaluated in person or tested for SARS-CoV-2  I recommend that you proceed forward to an emergency room for further evaluation, you declined this and you said you would go to an urgent care I provided you the telephone number for [facility]  2. Chest wall pain  I recommend that he proceed forward to an emergency room for evaluation, you declined this and they did you would go to an urgent care, I provided you with a telephone number for [facility]  3. Chronic respiratory failure with hypoxia (HCC)  Continue oxygen therapy as prescribed  >>>maintain oxygen saturations greater than 88 percent  >>>if unable to maintain oxygen saturations please contact the office  >>>do not smoke with oxygen  >>>can use nasal saline gel or nasal saline rinses to moisturize nose if oxygen causes dryness   4. Pulmonary emphysema, unspecified emphysema type (HCC)  Continue Symbicort 160 >>> 2 puffs in the morning right when you wake up, rinse out your mouth after use, 12 hours later 2 puffs, rinse after use >>> Take this daily, no matter what >>> This is not a rescue inhaler   Spiriva Respimat 2.5 >>> 2 puffs daily >>> Do this every day >>>This is not a rescue inhaler   Note your daily symptoms > remember "red flags" for COPD:   >>>Increase in cough >>>increase in sputum production >>>increase in shortness of breath or activity  intolerance.   If you notice these symptoms, please call the office to be seen.     5. COPD with acute exacerbation ([place])  Please present to the emergency room for further evaluation, you declined this and stated that you will go to an urgent care.  I provided you the telephone number for [facility] emergency room.   Follow Up:    Return in about 4 weeks (around 01/05/2019), or if symptoms worsen or fail to improve, for Follow up  with Eric Form ACAGNP-BC.   Please do your part to reduce the spread of COVID-19:      Reduce your risk of any infection  and COVID19 by using the similar precautions used for avoiding the common cold or flu:  Marland Kitchen Wash your hands often with soap and warm water for at least 20 seconds.  If soap and water are not readily available, use an alcohol-based hand sanitizer with at least 60% alcohol.  . If coughing or sneezing, cover your mouth and nose by coughing or sneezing into the elbow areas of your shirt or coat, into a tissue or into your sleeve (not your hands). Langley Gauss A MASK when in public  . Avoid shaking hands with others and consider head nods or verbal greetings only. . Avoid touching your eyes, nose, or mouth with unwashed hands.  . Avoid close contact with people who are sick. . Avoid places or events with large numbers of people in one location, like concerts or sporting events. . If you have some symptoms but not all symptoms, continue to monitor at home and seek medical attention if your symptoms worsen. . If you are having a medical emergency, call 911.   [place] / e-Visit: [email or web address]         [facility]: [facility]: [phone]  [facility]: [phone]     It is flu season:   >>> Best ways to protect herself from the flu: Receive the yearly flu vaccine, practice good hand hygiene washing with soap and also using hand sanitizer when available, eat a nutritious meals, get adequate rest, hydrate appropriately   Please contact the office if your symptoms worsen or you have concerns that you are not improving.   Thank you for choosing [facility] for your healthcare, and for allowing [place] to partner with you on your healthcare journey. I am thankful to be able to provide care to  you today.   Wyn Quaker FNP-C

## 2018-12-08 NOTE — Assessment & Plan Note (Signed)
Likely COPD exacerbation.  I am concerned regarding the patient's persistent chest wall pain.  She is grimacing and active and in a significant amount of pain throughout the entire telephone call.  I believe she needs to have acute in person evaluation.  I recommended for her to proceed to an emergency room.  She was very resistant to this.  She said that she will go to an urgent care.  I have provided her the telephone number to [facility] urgent care.  I have recommended that she call their office prior to presenting as they also may recommend for her to go to an emergency room  I emphasized to the patient multiple times that she needs to seek emergent evaluation for her symptoms.  We cannot treat the symptoms telephonically based off of the phone conversation and phone assessment that I have had.  She needs an in person evaluation with physical assessment.  Likely will need blood work to rule out other concerns for chest wall pain and chest pain.

## 2018-12-08 NOTE — Assessment & Plan Note (Addendum)
Plan: Patient needs to present to the emergency room or urgent care for further evaluation and in person evaluation

## 2018-12-08 NOTE — Assessment & Plan Note (Signed)
Emphasized repeatedly to the patient that she needs to be compliant with follow-up with our office.  She cannot see if simply present to our office anytime she is having acute visits.  We need to also see her when she is stable and doing intermittently well.  She reports that she did not understand that she was post to follow-up with [place] when she felt good.  Plan: We will get patient scheduled for a 4-week follow-up in our office.  Patient would like for this to be with Eric Form, NP

## 2018-12-08 NOTE — Telephone Encounter (Signed)
Patient complaining of nausea, shortness of breath, cough, fever, body aches and they are getting worse.   Mychart visit via boyfriends phone set up with Wyn Quaker at [phone]

## 2018-12-09 ENCOUNTER — Encounter (HOSPITAL_COMMUNITY): Payer: Self-pay

## 2018-12-09 ENCOUNTER — Ambulatory Visit (HOSPITAL_COMMUNITY)
Admission: EM | Admit: 2018-12-09 | Discharge: 2018-12-09 | Disposition: A | Discharge status: Home / Self Care | Payer: Medicare HMO | Attending: Family Medicine | Admitting: Family Medicine

## 2018-12-09 ENCOUNTER — Other Ambulatory Visit: Payer: Self-pay

## 2018-12-09 ENCOUNTER — Ambulatory Visit (INDEPENDENT_AMBULATORY_CARE_PROVIDER_SITE_OTHER): Payer: Medicare HMO

## 2018-12-09 DIAGNOSIS — R6883 Chills (without fever): Secondary | ICD-10-CM

## 2018-12-09 DIAGNOSIS — Z20828 Contact with and (suspected) exposure to other viral communicable diseases: Secondary | ICD-10-CM | POA: Insufficient documentation

## 2018-12-09 DIAGNOSIS — M791 Myalgia, unspecified site: Secondary | ICD-10-CM | POA: Diagnosis not present

## 2018-12-09 DIAGNOSIS — R0602 Shortness of breath: Secondary | ICD-10-CM | POA: Diagnosis not present

## 2018-12-09 DIAGNOSIS — Z20822 Contact with and (suspected) exposure to covid-19: Secondary | ICD-10-CM

## 2018-12-09 DIAGNOSIS — Z9981 Dependence on supplemental oxygen: Secondary | ICD-10-CM

## 2018-12-09 DIAGNOSIS — J441 Chronic obstructive pulmonary disease with (acute) exacerbation: Secondary | ICD-10-CM

## 2018-12-09 DIAGNOSIS — R05 Cough: Secondary | ICD-10-CM | POA: Diagnosis not present

## 2018-12-09 LAB — POCT URINALYSIS DIP (DEVICE)
Bilirubin Urine: NEGATIVE
Glucose, UA: NEGATIVE mg/dL
Hgb urine dipstick: NEGATIVE
Ketones, ur: NEGATIVE mg/dL
Leukocytes,Ua: NEGATIVE
Nitrite: NEGATIVE
Protein, ur: NEGATIVE mg/dL
Specific Gravity, Urine: 1.025 (ref 1.005–1.030)
Urobilinogen, UA: 0.2 mg/dL (ref 0.0–1.0)
pH: 6 (ref 5.0–8.0)

## 2018-12-09 MED ORDER — PREDNISONE 20 MG PO TABS: 20.0000 mg | ORAL_TABLET | Freq: Two times a day (BID) | ORAL | 0 refills | Status: DC

## 2018-12-09 MED ORDER — AMOXICILLIN-POT CLAVULANATE 875-125 MG PO TABS: 1.0000 | ORAL_TABLET | Freq: Two times a day (BID) | ORAL | 0 refills | Status: DC

## 2018-12-09 NOTE — ED Provider Notes (Signed)
[facility]    CSN: [id number] Arrival date & time: 12/09/18  1021      History   Chief Complaint Chief Complaint  Patient presents with  . Abdominal Pain  . Headache  . Chills  . Generalized Body Aches    HPI Shameria Trimarco is a 63 y.o. female.   HPI  Had an e visit with pulmonary medicine.  Seen there for O2 dependent COPD that interaction is reviewed, she was advised to go for additional care. Here today with body aches, chills, headache, increased SOB and use of inhalers, fatigue.  NO NVD.  NO fever.  Took temp and it was 98.3  Felt like she had a fever.   She told provider yesterday that she had increased chest pain but does not complain of that today.  No pedal edema.  No history of heart disease. History of anxiety and bipolar illness, fibromyalgia in addition to the lung disease.  She told the nurse that she had been having abdominal pain.  When I specifically asked she says she did not have any abdominal pain at this point. Past Medical History:  Diagnosis Date  . Arthritis   . Chronic neck pain    had previously seen Dr. Salli Real at [facility]   . COPD (chronic obstructive pulmonary disease) (HCC)    sees Dr. Delton Coombes   . Depression   . Fibromyalgia   . Fibromyalgia   . Headache   . Hypertension     Patient Active Problem List   Diagnosis Date Noted  . Chest wall pain 12/08/2018  . Noncompliance 12/08/2018  . COPD with acute exacerbation (HCC) 02/04/2018  . Fatigue 12/08/2017  . Preventative health care 12/08/2017  . Atelectasis 11/25/2017  . Cough variant asthma vs uacs/vcd 07/18/2017  . DOE (dyspnea on exertion) 07/16/2017  . Fibromyalgia 05/03/2017  . Migraines 04/12/2017  . Chronic respiratory failure with hypoxia (HCC) 04/01/2017  . Allergic rhinitis 03/13/2016  . Vocal cord dysfunction 01/29/2016  . COPD (chronic obstructive pulmonary disease) (HCC) 12/19/2015  . Obesity 12/19/2015  . Bipolar I disorder (HCC) 08/27/2015   . INSOMNIA, CHRONIC 11/03/2006  . Depression 11/03/2006  . Essential hypertension 11/03/2006  . MENOPAUSAL SYNDROME 11/03/2006  . Osteoarthritis 11/03/2006    Past Surgical History:  Procedure Laterality Date  . FRACTURE SURGERY      OB History   No obstetric history on file.      Home Medications    Prior to Admission medications   Medication Sig Start Date End Date Taking? Authorizing Provider  albuterol (PROVENTIL HFA;VENTOLIN HFA) 108 (90 Base) MCG/ACT inhaler Inhale 2 puffs into the lungs every 6 (six) hours as needed for wheezing or shortness of breath. 02/02/18   Ivonne Andrew, NP  amLODipine (NORVASC) 5 MG tablet Take 1 tablet (5 mg total) by mouth daily. 06/14/18   Nelwyn Salisbury, MD  amoxicillin-clavulanate (AUGMENTIN) 875-125 MG tablet Take 1 tablet by mouth every 12 (twelve) hours. 12/09/18   Eustace Moore, MD  budesonide-formoterol [facility]) 160-4.5 MCG/ACT inhaler Inhale 2 puffs into the lungs 2 (two) times daily. 06/21/18   Bevelyn Ngo, NP  cholecalciferol (VITAMIN D) 1000 units tablet Take 1,000 Units by mouth daily.    [provider]  estradiol (ESTRACE) 1 MG tablet Take 1 tablet (1 mg total) by mouth daily. 06/14/18   Nelwyn Salisbury, MD  Fexofenadine HCl [facility] ALLERGY PO) Take 1 tablet by mouth daily as needed (allergies/congestion.).  [provider]  fluticasone (FLONASE) 50 MCG/ACT nasal spray Place 2 sprays into both nostrils daily. 03/13/16   Collene Gobble, MD  ibuprofen (ADVIL,MOTRIN) 800 MG tablet TAKE 1 TABLET BY MOUTH EVERY 6 HOURS AS NEEDED FOR PAIN 11/10/17   Laurey Morale, MD  lamoTRIgine (LAMICTAL) 25 MG tablet Take 1 tablet (25 mg total) by mouth 2 (two) times daily. 06/14/18   Laurey Morale, MD  levalbuterol Penne Lash) 0.63 MG/3ML nebulizer solution Take 3 mLs (0.63 mg total) by nebulization every 4 (four) hours as needed for wheezing or shortness of breath. 06/21/18 09/19/18  Magdalen Spatz, NP  LORazepam (ATIVAN) 2 MG  tablet TAKE 1 TABLET(2 MG) BY MOUTH EVERY 8 HOURS AS NEEDED FOR ANXIETY 06/14/18   Laurey Morale, MD  losartan (COZAAR) 50 MG tablet TAKE 1 TABLET(50 MG) BY MOUTH DAILY 11/28/18   Laurey Morale, MD  losartan-hydrochlorothiazide (HYZAAR) 100-25 MG tablet Take 1 tablet by mouth daily. 06/14/18   Laurey Morale, MD  medroxyPROGESTERone (PROVERA) 2.5 MG tablet Take 1 tablet (2.5 mg total) by mouth daily. 06/14/18   Laurey Morale, MD  OLANZapine (ZYPREXA) 10 MG tablet Take 1 tablet (10 mg total) by mouth at bedtime. 06/14/18   Laurey Morale, MD  OXYGEN 3lpm with sleep and exertion if needed    [provider]  pantoprazole (PROTONIX) 40 MG tablet Take 1 tablet (40 mg total) by mouth daily. 06/14/18   Laurey Morale, MD  predniSONE (DELTASONE) 20 MG tablet Take 1 tablet (20 mg total) by mouth 2 (two) times daily with a meal. 12/09/18   Raylene Everts, MD  QUEtiapine (SEROQUEL) 25 MG tablet Take 1 tablet (25 mg total) by mouth every 6 (six) hours as needed. 06/14/18   Laurey Morale, MD  Tiotropium Bromide Monohydrate (SPIRIVA RESPIMAT) 2.5 MCG/ACT AERS Inhale 2 puffs into the lungs daily. 06/21/18   Magdalen Spatz, NP  buPROPion (WELLBUTRIN XL) 300 MG 24 hr tablet Take 1 tablet (300 mg total) by mouth daily. 06/14/18 12/09/18  Laurey Morale, MD  zolpidem (AMBIEN) 10 MG tablet TAKE 1 TABLET BY MOUTH EVERY NIGHT AT BEDTIME AS NEEDED SLEEP 06/14/18 12/09/18  Laurey Morale, MD    Family History History reviewed. No pertinent family history.  Social History Social History   Tobacco Use  . Smoking status: Former Smoker    Packs/day: 0.80    Years: 25.00    Pack years: 20.00    Types: Cigarettes    Quit date: 03/09/1990    Years since quitting: 28.7  . Smokeless tobacco: Never Used  . Tobacco comment: 1 cig every 2 weeks  Substance Use Topics  . Alcohol use: No    Alcohol/week: 0.0 standard drinks  . Drug use: No     Allergies   Codeine   Review of Systems Review of Systems  Constitutional:  Positive for appetite change, chills and fatigue. Negative for fever.  HENT: Negative for ear pain and sore throat.   Eyes: Negative for pain and visual disturbance.  Respiratory: Positive for cough, chest tightness, shortness of breath and wheezing.   Cardiovascular: Negative for chest pain and palpitations.  Gastrointestinal: Negative for abdominal pain and vomiting.  Genitourinary: Negative for dysuria and hematuria.  Musculoskeletal: Positive for myalgias. Negative for arthralgias and back pain.  Skin: Negative for color change and rash.  Neurological: Negative for seizures and syncope.  Psychiatric/Behavioral: Positive for sleep disturbance.  All other systems reviewed  and are negative.    Physical Exam Triage Vital Signs ED Triage Vitals  Enc Vitals Group     BP 12/09/18 1047 134/66     Pulse Rate 12/09/18 1047 67     Resp 12/09/18 1047 15     Temp 12/09/18 1047 97.6 F (36.4 C)     Temp Source 12/09/18 1047 Temporal     SpO2 12/09/18 1047 100 %     Weight --      Height --      Head Circumference --      Peak Flow --      Pain Score 12/09/18 1044 8     Pain Loc --      Pain Edu? --      Excl. in GC? --    No data found.  Updated Vital Signs BP 134/66 (BP Location: Right Arm)   Pulse 67   Temp 97.6 F (36.4 C) (Temporal)   Resp 15   SpO2 100%      Physical Exam Constitutional:      General: She is not in acute distress.    Appearance: She is well-developed.     Comments: Overweight. On portable O 2  HENT:     Head: Normocephalic and atraumatic.     Nose: No congestion.     Mouth/Throat:     Mouth: Mucous membranes are moist.  Eyes:     Conjunctiva/sclera: Conjunctivae normal.     Pupils: Pupils are equal, round, and reactive to light.  Neck:     Musculoskeletal: Normal range of motion and neck supple.  Cardiovascular:     Rate and Rhythm: Normal rate and regular rhythm.     Heart sounds: Normal heart sounds.  Pulmonary:     Effort: Pulmonary  effort is normal. No respiratory distress.     Breath sounds: Wheezing and rhonchi present.     Comments: scattered Abdominal:     General: There is no distension.     Palpations: Abdomen is soft.  Musculoskeletal: Normal range of motion.  Skin:    General: Skin is warm and dry.  Neurological:     General: No focal deficit present.     Mental Status: She is alert.  Psychiatric:        Mood and Affect: Mood is anxious.     Comments: Emotional lability.  Some erratic behavior      UC Treatments / Results  Labs (all labs ordered are listed, but only abnormal results are displayed) Labs Reviewed  NOVEL CORONAVIRUS, NAA (HOSP ORDER, SEND-OUT TO REF LAB; TAT 18-24 HRS)  POCT URINALYSIS DIP (DEVICE)    EKG   Radiology Dg Chest 2 View  Result Date: 12/09/2018 CLINICAL DATA:  Dry cough and shortness of breath EXAM: CHEST - 2 VIEW COMPARISON:  02/14/2018 FINDINGS: Cardiac shadows within normal limits. The lungs are well aerated bilaterally. Platelike atelectasis is noted in the left mid lung. No sizable effusion is seen. No bony abnormality is noted. IMPRESSION: Mild left midlung atelectasis. Electronically Signed   By: Alcide Clever M.D.   On: 12/09/2018 11:43    Procedures Procedures (including critical care time)  Medications Ordered in UC Medications - No data to display  Initial Impression / Assessment and Plan / UC Course  I have reviewed the triage vital signs and the nursing notes.  Pertinent labs & imaging results that were available during my care of the patient were reviewed by me and considered in my medical  decision making (see chart for details).  Clinical Course as of Dec 09 1207  Fri Dec 09, 2018  1148 DG Chest 2 View [YN]    Clinical Course User Index [YN] Eustace MooreNelson, Jenyfer Trawick Sue, MD    The CXR does not resemble COVID, but this is still a possibility.  Quarantine discussed.  Will treat as COPD exacerbation.  She has a variable history.  Some bizarre behavior.   Recommend follow-up with her pulmonary doctor Final Clinical Impressions(s) / UC Diagnoses   Final diagnoses:  None     Discharge Instructions     Take the antibiotic 2 times a day Take the prednisone 2 times a day Drink plenty of fluids Go to the ER if worse instead of better   ED Prescriptions    Medication Sig Dispense Auth. Provider   amoxicillin-clavulanate (AUGMENTIN) 875-125 MG tablet Take 1 tablet by mouth every 12 (twelve) hours. 14 tablet Eustace MooreNelson, Gloriann Riede Sue, MD   predniSONE (DELTASONE) 20 MG tablet Take 1 tablet (20 mg total) by mouth 2 (two) times daily with a meal. 10 tablet Eustace MooreNelson, Burnette Sautter Sue, MD     PDMP not reviewed this encounter.   Eustace MooreNelson, Lamara Brecht Sue, MD 12/09/18 2114

## 2018-12-09 NOTE — ED Triage Notes (Signed)
Pt report having abdominal, headache, chills and generalized body aches x 4 days.

## 2018-12-09 NOTE — Discharge Instructions (Signed)
Take the antibiotic 2 times a day Take the prednisone 2 times a day Drink plenty of fluids Go to the ER if worse instead of better

## 2018-12-10 LAB — NOVEL CORONAVIRUS, NAA (HOSP ORDER, SEND-OUT TO REF LAB; TAT 18-24 HRS): SARS-CoV-2, NAA: NOT DETECTED

## 2018-12-12 ENCOUNTER — Encounter (HOSPITAL_COMMUNITY): Payer: Self-pay

## 2018-12-22 ENCOUNTER — Encounter: Payer: Self-pay | Admitting: Family Medicine

## 2018-12-26 ENCOUNTER — Encounter: Payer: Self-pay | Admitting: Family Medicine

## 2019-01-04 DIAGNOSIS — J9611 Chronic respiratory failure with hypoxia: Secondary | ICD-10-CM | POA: Diagnosis not present

## 2019-01-05 ENCOUNTER — Ambulatory Visit: Payer: Medicare HMO

## 2019-01-05 DIAGNOSIS — J301 Allergic rhinitis due to pollen: Secondary | ICD-10-CM | POA: Diagnosis not present

## 2019-01-05 DIAGNOSIS — J9611 Chronic respiratory failure with hypoxia: Secondary | ICD-10-CM | POA: Diagnosis not present

## 2019-01-05 DIAGNOSIS — F319 Bipolar disorder, unspecified: Secondary | ICD-10-CM | POA: Diagnosis not present

## 2019-01-05 DIAGNOSIS — J439 Emphysema, unspecified: Secondary | ICD-10-CM | POA: Diagnosis not present

## 2019-02-04 DIAGNOSIS — J9611 Chronic respiratory failure with hypoxia: Secondary | ICD-10-CM | POA: Diagnosis not present

## 2019-02-05 DIAGNOSIS — J9611 Chronic respiratory failure with hypoxia: Secondary | ICD-10-CM | POA: Diagnosis not present

## 2019-02-05 DIAGNOSIS — F319 Bipolar disorder, unspecified: Secondary | ICD-10-CM | POA: Diagnosis not present

## 2019-02-05 DIAGNOSIS — J301 Allergic rhinitis due to pollen: Secondary | ICD-10-CM | POA: Diagnosis not present

## 2019-02-05 DIAGNOSIS — J439 Emphysema, unspecified: Secondary | ICD-10-CM | POA: Diagnosis not present

## 2019-02-09 ENCOUNTER — Other Ambulatory Visit: Payer: Self-pay | Admitting: Family Medicine

## 2019-02-09 NOTE — Telephone Encounter (Signed)
Requested medication (s) are due for refill today: yes  Requested medication (s) are on the active medication list: yes  Last refill: 06/13/2018  Future visit scheduled: yes  Notes to clinic:  Refill cannot be delegated    Requested Prescriptions  Pending Prescriptions Disp Refills   LORazepam (ATIVAN) 2 MG tablet 270 tablet 1    Sig: TAKE 1 TABLET(2 MG) BY MOUTH EVERY 8 HOURS AS NEEDED FOR ANXIETY     Not Delegated - Psychiatry:  Anxiolytics/Hypnotics Failed - 02/09/2019 12:06 PM      Failed - This refill cannot be delegated      Failed - Urine Drug Screen completed in last 360 days.      Failed - Valid encounter within last 6 months    Recent Outpatient Visits          7 months ago Chronic narcotic use   [place] at [facility], Ishmael Holter, MD   1 year ago Chronic narcotic use   Therapist, music at [facility], Ishmael Holter, MD   1 year ago [place] at [facility], Ishmael Holter, MD   1 year ago Chronic narcotic use   Therapist, music at [facility], Ishmael Holter, MD   1 year ago Bipolar I disorder [facility])   [place] at [facility], Ishmael Holter, MD      Future Appointments            In 4 days Laurey Morale, MD [place] [facility] [place], [place]            zolpidem (AMBIEN) 10 MG tablet 90 tablet 1    Sig: TAKE 1 TABLET BY MOUTH EVERY NIGHT AT BEDTIME AS NEEDED SLEEP     Not Delegated - Psychiatry:  Anxiolytics/Hypnotics Failed - 02/09/2019 12:06 PM      Failed - This refill cannot be delegated      Failed - Urine Drug Screen completed in last 360 days.      Failed - Valid encounter within last 6 months    Recent Outpatient Visits          7 months ago Chronic narcotic use   [place] at [facility], Ishmael Holter, MD   1 year ago Chronic narcotic use   Therapist, music at [facility], Ishmael Holter, MD   1 year ago [place] at [facility], Ishmael Holter, MD   1  year ago Chronic narcotic use   Therapist, music at [facility], Ishmael Holter, MD   1 year ago Bipolar I disorder [facility])   [place] at [facility], Ishmael Holter, MD      Future Appointments            In 4 days Laurey Morale, MD [place] at [place], [facility]            ibuprofen (ADVIL) 800 MG tablet 120 tablet 5    Sig: Take 1 tablet (800 mg total) by mouth every 6 (six) hours as needed. for pain     Analgesics:  NSAIDS Failed - 02/09/2019 12:06 PM      Failed - Cr in normal range and within 360 days    Creat  Date Value Ref Range Status  08/27/2015 0.74 0.50 - 0.99 mg/dL Final    Comment:      For patients > or = 65 years of age: The upper reference limit for Creatinine is approximately 13% higher for people identified  as African-American.      Creatinine, Ser  Date Value Ref Range Status  08/19/2017 0.67 0.44 - 1.00 mg/dL Final         Failed - HGB in normal range and within 360 days    Hemoglobin  Date Value Ref Range Status  08/19/2017 13.1 12.0 - 15.0 g/dL Final         Passed - Patient is not pregnant      Passed - Valid encounter within last 12 months    Recent Outpatient Visits          7 months ago Chronic narcotic use   [facility] conservation officer at [facility], Tera Mater, MD   1 year ago Chronic narcotic use   Nature conservation officer at [facility], Tera Mater, MD   1 year ago Costochondritis   Nature conservation officer at [facility], Tera Mater, MD   1 year ago Chronic narcotic use   Nature conservation officer at [employer], Tera Mater, MD   1 year ago Bipolar I disorder [facility])   Nature conservation officer at [employer], Tera Mater, MD      Future Appointments            In 4 days Nelwyn Salisbury, MD [facility] at [place], [facility]

## 2019-02-09 NOTE — Telephone Encounter (Signed)
Copied from [place] [phone]. Topic: Quick Communication - Rx Refill/Question >> Feb 09, 2019 11:56 AM Leward Quan A wrote: Medication: zolpidem (AMBIEN) 10 MG tablet, HYDROcodone-acetaminophen (NORCO) 10-325 MG tablet, LORazepam (ATIVAN) 2 MG tablet, ibuprofen (ADVIL,MOTRIN) 800 MG tablet  Has the patient contacted their pharmacy? Yes.   (Agent: If no, request that the patient contact the pharmacy for the refill.) (Agent: If yes, when and what did the pharmacy advise?)  Preferred Pharmacy (with phone number or street name): [facility] [place], [place] - [address] [place] [phone] (Phone) [phone] (Fax)     Agent: Please be advised that RX refills may take up to 3 business days. We ask that you follow-up with your pharmacy.

## 2019-02-09 NOTE — Telephone Encounter (Signed)
Routing to PCP for approval.

## 2019-02-10 MED ORDER — LORAZEPAM 2 MG PO TABS: ORAL_TABLET | ORAL | 1 refills | Status: DC

## 2019-02-10 MED ORDER — IBUPROFEN 800 MG PO TABS: 800.0000 mg | ORAL_TABLET | Freq: Four times a day (QID) | ORAL | 5 refills | Status: DC | PRN

## 2019-02-10 MED ORDER — ZOLPIDEM TARTRATE 10 MG PO TABS: ORAL_TABLET | ORAL | 1 refills | Status: DC

## 2019-02-13 ENCOUNTER — Encounter: Payer: Self-pay | Admitting: Family Medicine

## 2019-02-13 ENCOUNTER — Telehealth (INDEPENDENT_AMBULATORY_CARE_PROVIDER_SITE_OTHER): Payer: Medicare HMO | Admitting: Family Medicine

## 2019-02-13 ENCOUNTER — Other Ambulatory Visit: Payer: Self-pay

## 2019-02-13 VITALS — Ht 64.0 in | Wt 214.0 lb

## 2019-02-13 DIAGNOSIS — F1190 Opioid use, unspecified, uncomplicated: Secondary | ICD-10-CM

## 2019-02-13 DIAGNOSIS — M797 Fibromyalgia: Secondary | ICD-10-CM

## 2019-02-13 MED ORDER — ESTRADIOL 1 MG PO TABS: 1.0000 mg | ORAL_TABLET | Freq: Every day | ORAL | 3 refills | Status: DC

## 2019-02-13 MED ORDER — AMLODIPINE BESYLATE 5 MG PO TABS: 5.0000 mg | ORAL_TABLET | Freq: Every day | ORAL | 3 refills | Status: DC

## 2019-02-13 MED ORDER — IBUPROFEN 800 MG PO TABS: 800.0000 mg | ORAL_TABLET | Freq: Four times a day (QID) | ORAL | 5 refills | Status: DC | PRN

## 2019-02-13 MED ORDER — HYDROCODONE-ACETAMINOPHEN 10-325 MG PO TABS: 1.0000 | ORAL_TABLET | Freq: Four times a day (QID) | ORAL | 0 refills | Status: DC | PRN

## 2019-02-13 MED ORDER — HYDROCODONE-ACETAMINOPHEN 10-325 MG PO TABS: 1.0000 | ORAL_TABLET | Freq: Four times a day (QID) | ORAL | 0 refills | Status: AC | PRN

## 2019-02-13 MED ORDER — LOSARTAN POTASSIUM-HCTZ 100-25 MG PO TABS: 1.0000 | ORAL_TABLET | Freq: Every day | ORAL | 3 refills | Status: DC

## 2019-02-13 MED ORDER — BUDESONIDE-FORMOTEROL FUMARATE 160-4.5 MCG/ACT IN AERO: 2.0000 | INHALATION_SPRAY | Freq: Two times a day (BID) | RESPIRATORY_TRACT | 3 refills | Status: DC

## 2019-02-13 MED ORDER — SPIRIVA RESPIMAT 2.5 MCG/ACT IN AERS: 2.0000 | INHALATION_SPRAY | Freq: Every day | RESPIRATORY_TRACT | 3 refills | Status: DC

## 2019-02-13 MED ORDER — LORAZEPAM 2 MG PO TABS: ORAL_TABLET | ORAL | 1 refills | Status: DC

## 2019-02-13 NOTE — Progress Notes (Signed)
Virtual Visit via Telephone Note  I connected with the patient on 02/13/19 at  2:45 PM EST by telephone and verified that I am speaking with the correct person using two identifiers. We attempted to connect virtually but we had technical difficulties with the audio and video.     I discussed the limitations, risks, security and privacy concerns of performing an evaluation and management service by telephone and the availability of in person appointments. I also discussed with the patient that there may be a patient responsible charge related to this service. The patient expressed understanding and agreed to proceed.  Location patient: home Location provider: work or home office Participants present for the call: patient, provider Patient did not have a visit in the prior 7 days to address this/these issue(s).   History of Present Illness: Here for pain management. She is doing well.  Indication for chronic opioid: fibromyalgia Medication and dose: Norco 10-325 # pills per month: 120 Last UDS date: 04-30-17 Opioid Treatment Agreement signed (Y/N): 04-30-17 Opioid Treatment Agreement last reviewed with patient:  02-13-19 NCCSRS reviewed this encounter (include red flags):  02-13-19    Observations/Objective: Patient sounds cheerful and well on the phone. I do not appreciate any SOB. Speech and thought processing are grossly intact. Patient reported vitals:  Assessment and Plan: Pain management, meds were refilled.  Alysia Penna, MD   Follow Up Instructions:     [phone] 5-10 [phone] 11-20 [id number] 21-30 I did not refer this patient for an OV in the next 24 hours for this/these issue(s).  I discussed the assessment and treatment plan with the patient. The patient was provided an opportunity to ask questions and all were answered. The patient agreed with the plan and demonstrated an understanding of the instructions.   The patient was advised to call back or seek an in-person evaluation  if the symptoms worsen or if the condition fails to improve as anticipated.  I provided 13 minutes of non-face-to-face time during this encounter.   Alysia Penna, MD

## 2019-02-22 ENCOUNTER — Ambulatory Visit (INDEPENDENT_AMBULATORY_CARE_PROVIDER_SITE_OTHER): Payer: Medicare HMO

## 2019-02-22 VITALS — Ht 64.0 in | Wt 214.0 lb

## 2019-02-22 DIAGNOSIS — Z1211 Encounter for screening for malignant neoplasm of colon: Secondary | ICD-10-CM | POA: Diagnosis not present

## 2019-02-22 DIAGNOSIS — Z0000 Encounter for general adult medical examination without abnormal findings: Secondary | ICD-10-CM

## 2019-02-22 DIAGNOSIS — Z1231 Encounter for screening mammogram for malignant neoplasm of breast: Secondary | ICD-10-CM

## 2019-02-22 DIAGNOSIS — Z780 Asymptomatic menopausal state: Secondary | ICD-10-CM | POA: Diagnosis not present

## 2019-02-22 DIAGNOSIS — M8949 Other hypertrophic osteoarthropathy, multiple sites: Secondary | ICD-10-CM | POA: Diagnosis not present

## 2019-02-22 DIAGNOSIS — M159 Polyosteoarthritis, unspecified: Secondary | ICD-10-CM

## 2019-02-22 NOTE — Progress Notes (Signed)
This visit is being conducted via phone call due to the COVID-19 pandemic. This patient has given me verbal consent via phone to conduct this visit, patient states they are participating from their home address. Some vital signs may be absent or patient reported.   Patient identification: identified by name, DOB, and current address.  Location provider: [facility], Office Persons participating in the virtual visit: Ms. Jerome Otter and Franne Forts, LPN.    Subjective:   Laurie Schaefer is a 63 y.o. female who presents for an Initial Medicare Annual Wellness Visit.  Ms.Saltos stated she is very anxious and irritable due to being on steroids chronically and that she never feels good anymore. She verbalized dissatisfaction with the questions I asked her for the wellness visit but declined to stop visit. She reported that she is "exercise intolerant".  Review of Systems         Objective:    Today's Vitals   02/22/19 1107  Weight: 214 lb (97.1 kg)  Height: 5\' 4"  (1.626 m)  PainSc: 10-Worst pain ever   Body mass index is 36.73 kg/m.  Advanced Directives 02/22/2019 03/30/2016 08/31/2015  Does Patient Have a Medical Advance Directive? No No No  Would patient like information on creating a medical advance directive? No - Patient declined Yes (MAU/Ambulatory/Procedural Areas - Information given) No - patient declined information    Current Medications (verified) Outpatient Encounter Medications as of 02/22/2019  Medication Sig  . albuterol (PROVENTIL HFA;VENTOLIN HFA) 108 (90 Base) MCG/ACT inhaler Inhale 2 puffs into the lungs every 6 (six) hours as needed for wheezing or shortness of breath.  Marland Kitchen amLODipine (NORVASC) 5 MG tablet Take 1 tablet (5 mg total) by mouth daily.  . budesonide-formoterol (SYMBICORT) 160-4.5 MCG/ACT inhaler Inhale 2 puffs into the lungs 2 (two) times daily.  Marland Kitchen estradiol (ESTRACE) 1 MG tablet Take 1 tablet (1 mg total) by mouth daily.  Marland Kitchen Fexofenadine HCl  (MUCINEX ALLERGY PO) Take 1 tablet by mouth daily as needed (allergies/congestion.).   Marland Kitchen fluticasone (FLONASE) 50 MCG/ACT nasal spray Place 2 sprays into both nostrils daily.  Derrill Memo ON 04/16/2019] HYDROcodone-acetaminophen (NORCO) 10-325 MG tablet Take 1 tablet by mouth every 6 (six) hours as needed for severe pain.  Marland Kitchen ibuprofen (ADVIL) 800 MG tablet Take 1 tablet (800 mg total) by mouth every 6 (six) hours as needed. for pain  . lamoTRIgine (LAMICTAL) 25 MG tablet Take 1 tablet (25 mg total) by mouth 2 (two) times daily.  Marland Kitchen LORazepam (ATIVAN) 2 MG tablet TAKE 1 TABLET(2 MG) BY MOUTH EVERY 8 HOURS AS NEEDED FOR ANXIETY  . losartan-hydrochlorothiazide (HYZAAR) 100-25 MG tablet Take 1 tablet by mouth daily.  . medroxyPROGESTERone (PROVERA) 2.5 MG tablet Take 1 tablet (2.5 mg total) by mouth daily.  . OXYGEN 3lpm with sleep and exertion if needed  . pantoprazole (PROTONIX) 40 MG tablet Take 1 tablet (40 mg total) by mouth daily.  . QUEtiapine (SEROQUEL) 25 MG tablet Take 1 tablet (25 mg total) by mouth every 6 (six) hours as needed.  . Tiotropium Bromide Monohydrate (SPIRIVA RESPIMAT) 2.5 MCG/ACT AERS Inhale 2 puffs into the lungs daily.  Marland Kitchen zolpidem (AMBIEN) 10 MG tablet TAKE 1 TABLET BY MOUTH EVERY NIGHT AT BEDTIME AS NEEDED SLEEP  . cholecalciferol (VITAMIN D) 1000 units tablet Take 1,000 Units by mouth daily.  Marland Kitchen levalbuterol (XOPENEX) 0.63 MG/3ML nebulizer solution Take 3 mLs (0.63 mg total) by nebulization every 4 (four) hours as needed for wheezing or shortness of breath.  . [  DISCONTINUED] buPROPion (WELLBUTRIN XL) 300 MG 24 hr tablet Take 1 tablet (300 mg total) by mouth daily.   No facility-administered encounter medications on file as of 02/22/2019.    Allergies (verified) Codeine   History: Past Medical History:  Diagnosis Date  . Arthritis   . Chronic neck pain    [facility]   . COPD (chronic obstructive pulmonary disease) (HCC)     sees Dr. Delton CoombesByrum   . Depression   . Fibromyalgia   . Fibromyalgia   . Headache   . Hypertension    Past Surgical History:  Procedure Laterality Date  . FRACTURE SURGERY     History reviewed. No pertinent family history. Social History   Socioeconomic History  . Marital status: Divorced    Spouse name: Not on file  . Number of children: 0  . Years of education: Not on file  . Highest education level: Not on file  Occupational History  . Not on file  Tobacco Use  . Smoking status: Former Smoker    Packs/day: 0.80    Years: 25.00    Pack years: 20.00    Types: Cigarettes    Quit date: 03/09/1990    Years since quitting: 28.9  . Smokeless tobacco: Never Used  . Tobacco comment: 1 cig every 2 weeks  Substance and Sexual Activity  . Alcohol use: No    Alcohol/week: 0.0 standard drinks  . Drug use: No  . Sexual activity: Not on file  Other Topics Concern  . Not on file  Social History Narrative   Divorced, HH 1   No children   States she's been to graduate school    Social Determinants of Health   Financial Resource Strain:   . Difficulty of Paying Living Expenses: Not on file  Food Insecurity:   . Worried About Programme researcher, broadcasting/film/videounning Out of Food in the Last Year: Not on file  . Ran Out of Food in the Last Year: Not on file  Transportation Needs:   . Lack of Transportation (Medical): Not on file  . Lack of Transportation (Non-Medical): Not on file  Physical Activity:   . Days of Exercise per Week: Not on file  . Minutes of Exercise per Session: Not on file  Stress:   . Feeling of Stress : Not on file  Social Connections:   . Frequency of Communication with Friends and Family: Not on file  . Frequency of Social Gatherings with Friends and Family: Not on file  . Attends Religious Services: Not on file  . Active Member of Clubs or Organizations: Not on file  . Attends BankerClub or Organization Meetings: Not on file  . Marital Status: Not on file    Tobacco Counseling Counseling  given: Not Answered Comment: 1 cig every 2 weeks   Clinical Intake:     Pain : 0-10 Pain Score: 10-Worst pain ever Pain Type: Chronic pain("everywhere")     Nutritional Status: BMI > 30  Obese Nutritional Risks: Unintentional weight gain Diabetes: No  How often do you need to have someone help you when you read instructions, pamphlets, or other written materials from your doctor or pharmacy?: 1 - Never  Interpreter Needed?: No      Activities of Daily Living In your present state of health, do you have any difficulty performing the following activities: 02/22/2019  Hearing? N  Vision? N  Difficulty concentrating or making decisions? Y  Walking or climbing stairs? YJeannie Fend  Dressing or bathing? N  Doing errands, shopping? Y  Preparing Food and eating ? N  Using the Toilet? N  In the past six months, have you accidently leaked urine? N  Do you have problems with loss of bowel control? N  Managing your Medications? N  Managing your Finances? N  Housekeeping or managing your Housekeeping? Y  Some recent data might be hidden     Immunizations and Health Maintenance Immunization History  Administered Date(s) Administered  . Influenza Inj Mdck Quad Pf 01/29/2016  . Influenza Split 12/07/2016  . Influenza,inj,Quad PF,6+ Mos 01/29/2016, 12/08/2017  . Pneumococcal Polysaccharide-23 01/29/2016   Health Maintenance Due  Topic Date Due  . Hepatitis C Screening  09-06-1955  . HIV Screening  03/07/1969  . TETANUS/TDAP  03/07/1973  . PAP SMEAR-Modifier  03/08/1975  . MAMMOGRAM  03/07/2004  . COLONOSCOPY  03/07/2004  . INFLUENZA VACCINE  10/08/2018    Patient Care Team: Nelwyn Salisbury, MD as PCP - General (Family Medicine)  Indicate any recent Medical Services you may have received from other than [facility] providers in the past year (date may be approximate).     Assessment:   This is a routine wellness examination for Shammara.  Hearing/Vision screen  Hearing Screening              Right ear:           Left ear:           Comments: No problems   Vision Screening Comments: Wears glasses, stated she needs an eye exam.   Dietary issues and exercise activities discussed: Current Exercise Habits: The patient does not participate in regular exercise at present  Goals    . Patient will find positive outlet for coping      Depression Screen PHQ 2/9 Scores 02/22/2019 03/30/2016  PHQ - 2 Score 6 4  PHQ- 9 Score 19 11    Fall Risk Fall Risk  02/22/2019 03/30/2016  Falls in the past year? 0 Yes  Number falls in past yr: - 1  Injury with Fall? - Yes  Risk for fall due to : Impaired mobility;Medication side effect History of fall(s);Impaired balance/gait  Follow up Falls evaluation completed;Education provided;Falls prevention discussed Falls evaluation completed;Education provided;Falls prevention discussed    Is the patient's home free of loose throw rugs in walkways, pet beds, electrical cords, etc?   yes      Grab bars in the bathroom? yes      Handrails on the stairs?   yes      Adequate lighting?   yes  Timed Get Up and Go Performed N/A due to telephone visit  Cognitive Function:  No concerns about memory noted today. Unable to complete cognitive screen due to patient's unwillingness.        Screening Tests Health Maintenance  Topic Date Due  . Hepatitis C Screening  Aug 01, 1955  . HIV Screening  03/07/1969  . TETANUS/TDAP  03/07/1973  . PAP SMEAR-Modifier  03/08/1975  . MAMMOGRAM  03/07/2004  . COLONOSCOPY  03/07/2004  . INFLUENZA VACCINE  10/08/2018    Qualifies for Shingles Vaccine? Yes, patient instructed to check with local pharmacy for her out of pocket cost of Shingrix vaccines.  Cancer Screenings: Lung: Low Dose CT Chest recommended if Age 54-80 years, 30 pack-year currently smoking OR have quit w/in 15years. Patient does not qualify. Breast: Up to date on Mammogram? No , order  sent to [facility] Up to date of  Bone Density/Dexa? Yes and order sent for repeat due around 04/27/2019. Colorectal: no, referral sent for colonoscopy.  Additional Screenings:  Hepatitis C Screening: defer to next lab draw in office.   Plan:   Ms. Colter would greatly benefit from a mental health provider referral but declines the offer. She stated she doesn't think her medications are helping her but that she doesn't want to make any abrupt changes. She stated that psychiatry in the past just "gave me medications" but repeatedly declined offer for referral to psychiatry or counseling.She shouted on the telephone while expressing her emotions about her depression.   Referral was sent for colonoscopy, orders placed for mammogram and DEXA scan as well. We discussed need for flu vaccine, updated Tdap, and Shingrix vaccines.   I have personally reviewed and noted the following in the patient's chart:   . Medical and social history . Use of alcohol, tobacco or illicit drugs  . Current medications and supplements . Functional ability and status . Nutritional status . Physical activity . Advanced directives . List of other physicians . Hospitalizations, surgeries, and ER visits in previous 12 months . Vitals . Screenings to include cognitive, depression, and falls . Referrals and appointments  In addition, I have reviewed and discussed with patient certain preventive protocols, quality metrics, and best practice recommendations. A written personalized care plan for preventive services as well as general preventive health recommendations were provided to patient.     Nathaniel Man, LPN   [id number]

## 2019-02-22 NOTE — Patient Instructions (Addendum)
Laurie Schaefer , Thank you for taking time to participate your Medicare Wellness Visit. I appreciate your ongoing commitment to your health goals. Please review the following plan we discussed and let me know if I can assist you in the future.   Screening recommendations/referrals: Colorectal Screening: referral sent to [facility]. They will contact you to schedule. Requested after the holidays per your request. Mammogram: referral sent to [facility]. They will contact you to schedule after the holidays per your request.  Bone Density: performed 04/27/2019; due again 04/28/2019. Order sent to [facility]. They will contact you to schedule after this date.  Vision and Dental Exams: Recommended annual ophthalmology exams for early detection of glaucoma and other disorders of the eye Recommended annual dental exams for proper oral hygiene  Diabetic Exams: Diabetic Eye Exam: N/A Diabetic Foot Exam: N/A  Vaccinations: Influenza vaccine: past due; please obtain from your local pharmacy or schedule a nurse visit in our office. Pneumococcal vaccine: completed 01/29/2016; up to date. Tdap vaccine: none on record. Please check with your pharmacy to determine your out of pocket expense. You may also receive this vaccine at your local pharmacy or Health Dept. Shingles vaccine: Please call your pharmacy to determine your out of pocket expense for the Shingrix vaccine. You may receive this vaccine at your local pharmacy. This is a series of 2 injections.  Advanced directives: Advance directives discussed with you today. Advance directives discussed with you today. You have declined to receive documents for completion.  Goals: Recommend to drink at least 6-8 8oz glasses of water per day.  Recommend to remove any items from the home that may cause slips or trips.  Recommend to decrease portion sizes by eating 3 small healthy meals and at least 2 healthy snacks per day.   Next  appointment: Please schedule your Annual Wellness Visit with your Nurse Health Advisor in one year.  Preventive Care 65-64 Years, Female Preventive care refers to lifestyle choices and visits with your health care provider that can promote health and wellness. What does preventive care include?  A yearly physical exam. This is also called an annual well check.  Dental exams once or twice a year.  Routine eye exams. Ask your health care provider how often you should have your eyes checked.  Personal lifestyle choices, including:  Daily care of your teeth and gums.  Regular physical activity.  Eating a healthy diet.  Avoiding tobacco and drug use.  Limiting alcohol use.  Practicing safe sex.  Taking low-dose aspirin daily starting at age 24 if recommended by your health care provider.  Taking vitamin and mineral supplements as recommended by your health care provider. What happens during an annual well check? The services and screenings done by your health care provider during your annual well check will depend on your age, overall health, lifestyle risk factors, and family history of disease. Counseling  Your health care provider may ask you questions about your:  Alcohol use.  Tobacco use.  Drug use.  Emotional well-being.  Home and relationship well-being.  Sexual activity.  Eating habits.  Work and work Statistician.  Method of birth control.  Menstrual cycle.  Pregnancy history. Screening  You may have the following tests or measurements:  Height, weight, and BMI.  Blood pressure.  Lipid and cholesterol levels. These may be checked every 5 years, or more frequently if you are over 1 years old.  Skin check.  Lung cancer screening. You may have  this screening every year starting at age 32 if you have a 30-pack-year history of smoking and currently smoke or have quit within the past 15 years.  Fecal occult blood test (FOBT) of the stool. You may  have this test every year starting at age 74.  Flexible sigmoidoscopy or colonoscopy. You may have a sigmoidoscopy every 5 years or a colonoscopy every 10 years starting at age 53.  Hepatitis C blood test.  Hepatitis B blood test.  Sexually transmitted disease (STD) testing.  Diabetes screening. This is done by checking your blood sugar (glucose) after you have not eaten for a while (fasting). You may have this done every 1-3 years.  Mammogram. This may be done every 1-2 years. Talk to your health care provider about when you should start having regular mammograms. This may depend on whether you have a family history of breast cancer.  BRCA-related cancer screening. This may be done if you have a family history of breast, ovarian, tubal, or peritoneal cancers.  Pelvic exam and Pap test. This may be done every 3 years starting at age 27. Starting at age 33, this may be done every 5 years if you have a Pap test in combination with an HPV test.  Bone density scan. This is done to screen for osteoporosis. You may have this scan if you are at high risk for osteoporosis. Discuss your test results, treatment options, and if necessary, the need for more tests with your health care provider. Vaccines  Your health care provider may recommend certain vaccines, such as:  Influenza vaccine. This is recommended every year.  Tetanus, diphtheria, and acellular pertussis (Tdap, Td) vaccine. You may need a Td booster every 10 years.  Zoster vaccine. You may need this after age 41.  Pneumococcal 13-valent conjugate (PCV13) vaccine. You may need this if you have certain conditions and were not previously vaccinated.  Pneumococcal polysaccharide (PPSV23) vaccine. You may need one or two doses if you smoke cigarettes or if you have certain conditions. Talk to your health care provider about which screenings and vaccines you need and how often you need them. This information is not intended to replace  advice given to you by your health care provider. Make sure you discuss any questions you have with your health care provider. Document Released: 03/22/2015 Document Revised: 11/13/2015 Document Reviewed: 12/25/2014 Elsevier Interactive Patient Education  2017 [place] Prevention in the Home Falls can cause injuries. They can happen to people of all ages. There are many things you can do to make your home safe and to help prevent falls. What can I do on the outside of my home?  Regularly fix the edges of walkways and driveways and fix any cracks.  Remove anything that might make you trip as you walk through a door, such as a raised step or threshold.  Trim any bushes or trees on the path to your home.  Use bright outdoor lighting.  Clear any walking paths of anything that might make someone trip, such as rocks or tools.  Regularly check to see if handrails are loose or broken. Make sure that both sides of any steps have handrails.  Any raised decks and porches should have guardrails on the edges.  Have any leaves, snow, or ice cleared regularly.  Use sand or salt on walking paths during winter.  Clean up any spills in your garage right away. This includes oil or grease spills. What can I do in  the bathroom?  Use night lights.  Install grab bars by the toilet and in the tub and shower. Do not use towel bars as grab bars.  Use non-skid mats or decals in the tub or shower.  If you need to sit down in the shower, use a plastic, non-slip stool.  Keep the floor dry. Clean up any water that spills on the floor as soon as it happens.  Remove soap buildup in the tub or shower regularly.  Attach bath mats securely with double-sided non-slip rug tape.  Do not have throw rugs and other things on the floor that can make you trip. What can I do in the bedroom?  Use night lights.  Make sure that you have a light by your bed that is easy to reach.  Do not use any  sheets or blankets that are too big for your bed. They should not hang down onto the floor.  Have a firm chair that has side arms. You can use this for support while you get dressed.  Do not have throw rugs and other things on the floor that can make you trip. What can I do in the kitchen?  Clean up any spills right away.  Avoid walking on wet floors.  Keep items that you use a lot in easy-to-reach places.  If you need to reach something above you, use a strong step stool that has a grab bar.  Keep electrical cords out of the way.  Do not use floor polish or wax that makes floors slippery. If you must use wax, use non-skid floor wax.  Do not have throw rugs and other things on the floor that can make you trip. What can I do with my stairs?  Do not leave any items on the stairs.  Make sure that there are handrails on both sides of the stairs and use them. Fix handrails that are broken or loose. Make sure that handrails are as long as the stairways.  Check any carpeting to make sure that it is firmly attached to the stairs. Fix any carpet that is loose or worn.  Avoid having throw rugs at the top or bottom of the stairs. If you do have throw rugs, attach them to the floor with carpet tape.  Make sure that you have a light switch at the top of the stairs and the bottom of the stairs. If you do not have them, ask someone to add them for you. What else can I do to help prevent falls?  Wear shoes that:  Do not have high heels.  Have rubber bottoms.  Are comfortable and fit you well.  Are closed at the toe. Do not wear sandals.  If you use a stepladder:  Make sure that it is fully opened. Do not climb a closed stepladder.  Make sure that both sides of the stepladder are locked into place.  Ask someone to hold it for you, if possible.  Clearly mark and make sure that you can see:  Any grab bars or handrails.  First and last steps.  Where the edge of each step  is.  Use tools that help you move around (mobility aids) if they are needed. These include:  Canes.  Walkers.  Scooters.  Crutches.  Turn on the lights when you go into a dark area. Replace any light bulbs as soon as they burn out.  Set up your furniture so you have a clear path. Avoid moving your furniture around.  If any of your floors are uneven, fix them.  If there are any pets around you, be aware of where they are.  Review your medicines with your doctor. Some medicines can make you feel dizzy. This can increase your chance of falling. Ask your doctor what other things that you can do to help prevent falls. This information is not intended to replace advice given to you by your health care provider. Make sure you discuss any questions you have with your health care provider. Document Released: 12/20/2008 Document Revised: 08/01/2015 Document Reviewed: 03/30/2014 Elsevier Interactive Patient Education  2017 [employer].

## 2019-02-23 ENCOUNTER — Other Ambulatory Visit: Payer: Self-pay

## 2019-02-23 NOTE — Telephone Encounter (Signed)
Please send this through [facility]-scribe. Pharmacy would not accept this over the phone. Rx pended.

## 2019-02-23 NOTE — Telephone Encounter (Signed)
-----   Message from Laurey Morale, MD sent at 02/22/2019  4:36 PM EST ----- Regarding: FW: medication request from patient Please call in Trazodone 100 mg to take every evening, #30 with 5 rf ----- Message ----- From: Christiane Ha, LPN Sent: [id number]  12:16 PM EST To: Laurey Morale, MD Subject: medication request from patient                Patient would like you to prescribe trazodone 100mg  po qpm for her since she cannot tolerate olanzapine. She stated this gave her tardive dyskinesia. She would like to take the trazodone early in the evening because she is only sleeping 2 hrs at a time. She would like someone to call her and let her know if you will do this.

## 2019-02-24 MED ORDER — TRAZODONE HCL 100 MG PO TABS: 100.0000 mg | ORAL_TABLET | Freq: Every day | ORAL | 11 refills | Status: DC

## 2019-03-06 DIAGNOSIS — J9611 Chronic respiratory failure with hypoxia: Secondary | ICD-10-CM | POA: Diagnosis not present

## 2019-03-07 DIAGNOSIS — F319 Bipolar disorder, unspecified: Secondary | ICD-10-CM | POA: Diagnosis not present

## 2019-03-07 DIAGNOSIS — J9611 Chronic respiratory failure with hypoxia: Secondary | ICD-10-CM | POA: Diagnosis not present

## 2019-03-07 DIAGNOSIS — J439 Emphysema, unspecified: Secondary | ICD-10-CM | POA: Diagnosis not present

## 2019-03-07 DIAGNOSIS — J301 Allergic rhinitis due to pollen: Secondary | ICD-10-CM | POA: Diagnosis not present

## 2019-04-06 DIAGNOSIS — J9611 Chronic respiratory failure with hypoxia: Secondary | ICD-10-CM | POA: Diagnosis not present

## 2019-04-07 ENCOUNTER — Encounter: Payer: Self-pay | Admitting: Family Medicine

## 2019-04-07 ENCOUNTER — Other Ambulatory Visit: Payer: Self-pay | Admitting: Family Medicine

## 2019-04-07 DIAGNOSIS — M79672 Pain in left foot: Secondary | ICD-10-CM

## 2019-04-07 DIAGNOSIS — F319 Bipolar disorder, unspecified: Secondary | ICD-10-CM | POA: Diagnosis not present

## 2019-04-07 DIAGNOSIS — M79671 Pain in right foot: Secondary | ICD-10-CM

## 2019-04-07 DIAGNOSIS — J439 Emphysema, unspecified: Secondary | ICD-10-CM | POA: Diagnosis not present

## 2019-04-07 DIAGNOSIS — J301 Allergic rhinitis due to pollen: Secondary | ICD-10-CM | POA: Diagnosis not present

## 2019-04-07 DIAGNOSIS — J9611 Chronic respiratory failure with hypoxia: Secondary | ICD-10-CM | POA: Diagnosis not present

## 2019-04-07 NOTE — Telephone Encounter (Signed)
I referred her to Podiatry

## 2019-04-10 ENCOUNTER — Encounter: Payer: Self-pay | Admitting: Family Medicine

## 2019-04-11 MED ORDER — LINACLOTIDE 145 MCG PO CAPS: 145.0000 ug | ORAL_CAPSULE | Freq: Every day | ORAL | 5 refills | Status: DC

## 2019-04-11 NOTE — Telephone Encounter (Signed)
Call in Linzess 145 mcg daily, #30 with 5 rf

## 2019-04-14 ENCOUNTER — Other Ambulatory Visit: Payer: Self-pay

## 2019-04-14 ENCOUNTER — Ambulatory Visit: Payer: Medicare HMO | Admitting: Podiatry

## 2019-04-14 DIAGNOSIS — M79676 Pain in unspecified toe(s): Secondary | ICD-10-CM | POA: Diagnosis not present

## 2019-04-14 DIAGNOSIS — L600 Ingrowing nail: Secondary | ICD-10-CM

## 2019-04-19 ENCOUNTER — Ambulatory Visit: Payer: Medicare HMO | Admitting: Acute Care

## 2019-04-23 ENCOUNTER — Ambulatory Visit: Payer: Medicare HMO | Attending: Internal Medicine

## 2019-04-23 DIAGNOSIS — Z23 Encounter for immunization: Secondary | ICD-10-CM | POA: Insufficient documentation

## 2019-04-23 NOTE — Progress Notes (Signed)
   Covid-19 Vaccination Clinic  Name:  Laurie Schaefer    MRN: [id number] DOB: 09/09/1955  04/23/2019  Laurie Schaefer was observed post Covid-19 immunization for 15 minutes without incidence. She was provided with Vaccine Information Sheet and instruction to access the V-Safe system.   Laurie Schaefer was instructed to call 911 with any severe reactions post vaccine: Marland Kitchen Difficulty breathing  . Swelling of your face and throat  . A fast heartbeat  . A bad rash all over your body  . Dizziness and weakness    Immunizations Administered    Name Date Dose VIS Date Route   Pfizer COVID-19 Vaccine 04/23/2019  9:16 AM 0.3 mL 02/17/2019 Intramuscular   Manufacturer: [employer], [employer]   Lot: [id number]   NDC: [address]-7

## 2019-05-07 DIAGNOSIS — J439 Emphysema, unspecified: Secondary | ICD-10-CM | POA: Diagnosis not present

## 2019-05-07 DIAGNOSIS — F319 Bipolar disorder, unspecified: Secondary | ICD-10-CM | POA: Diagnosis not present

## 2019-05-07 DIAGNOSIS — J9611 Chronic respiratory failure with hypoxia: Secondary | ICD-10-CM | POA: Diagnosis not present

## 2019-05-07 DIAGNOSIS — J301 Allergic rhinitis due to pollen: Secondary | ICD-10-CM | POA: Diagnosis not present

## 2019-05-07 NOTE — Progress Notes (Signed)
  Subjective:  Patient ID: Laurie Schaefer, female    DOB: 08-21-1955,  MRN: [id number]  Chief Complaint  Patient presents with  . Nail Problem    Bilateral 1st toenails lateral borders painful, 2 week duration, discoloration.  . Nail Problem    Right 5th toenail discoloration 3 weeks duration.    64 y.o. female presents with the above complaint. History confirmed with patient.   Objective:  Physical Exam: warm, good capillary refill, nail exam onychomycosis of the toenails and pain to palpation of the nails, ingrown nails bilat 1st to, no trophic changes or ulcerative lesions. DP pulses palpable, PT pulses palpable and protective sensation intact Left Foot: normal exam, no swelling, tenderness, instability; ligaments intact, full range of motion of all ankle/foot joints  Right Foot: normal exam, no swelling, tenderness, instability; ligaments intact, full range of motion of all ankle/foot joints   No images are attached to the encounter.  Assessment:   1. Ingrown nail   2. Pain around toenail      Plan:  Patient was evaluated and treated and all questions answered.  Onychomycosis -Nails debrided in slant back fashion to patient relief. -Other nails debrided courtesy  No follow-ups on file.

## 2019-05-16 ENCOUNTER — Ambulatory Visit: Payer: Medicare HMO | Attending: Internal Medicine

## 2019-05-16 DIAGNOSIS — Z23 Encounter for immunization: Secondary | ICD-10-CM | POA: Insufficient documentation

## 2019-05-16 NOTE — Progress Notes (Signed)
   Covid-19 Vaccination Clinic  Name:  Allexa Acoff    MRN: [id number] DOB: [birth year]  05/16/2019  Ms. Sokolowski was observed post Covid-19 immunization for 15 minutes without incident. She was provided with Vaccine Information Sheet and instruction to access the V-Safe system.   Ms. Toso was instructed to call 911 with any severe reactions post vaccine: Marland Kitchen Difficulty breathing  . Swelling of face and throat  . A fast heartbeat  . A bad rash all over body  . Dizziness and weakness   Immunizations Administered    Name Date Dose VIS Date Route   Pfizer COVID-19 Vaccine 05/16/2019 12:07 PM 0.3 mL 02/17/2019 Intramuscular   Manufacturer: [employer], [employer]   Lot: [id number]   NDC: [address]-8

## 2019-05-19 ENCOUNTER — Encounter: Payer: Self-pay | Admitting: Family Medicine

## 2019-05-29 ENCOUNTER — Other Ambulatory Visit: Payer: Self-pay | Admitting: Nurse Practitioner

## 2019-05-29 ENCOUNTER — Telehealth: Payer: Self-pay | Admitting: Family Medicine

## 2019-05-29 ENCOUNTER — Telehealth: Payer: Self-pay | Admitting: Acute Care

## 2019-05-29 ENCOUNTER — Ambulatory Visit (INDEPENDENT_AMBULATORY_CARE_PROVIDER_SITE_OTHER): Payer: Medicare HMO | Admitting: Adult Health

## 2019-05-29 ENCOUNTER — Other Ambulatory Visit: Payer: Self-pay

## 2019-05-29 ENCOUNTER — Encounter: Payer: Self-pay | Admitting: Adult Health

## 2019-05-29 DIAGNOSIS — J441 Chronic obstructive pulmonary disease with (acute) exacerbation: Secondary | ICD-10-CM

## 2019-05-29 MED ORDER — PREDNISONE 10 MG PO TABS: ORAL_TABLET | ORAL | 0 refills | Status: DC

## 2019-05-29 MED ORDER — BENZONATATE 200 MG PO CAPS: 200.0000 mg | ORAL_CAPSULE | Freq: Three times a day (TID) | ORAL | 1 refills | Status: DC | PRN

## 2019-05-29 MED ORDER — DOXYCYCLINE HYCLATE 100 MG PO TABS: 100.0000 mg | ORAL_TABLET | Freq: Two times a day (BID) | ORAL | 0 refills | Status: DC

## 2019-05-29 NOTE — Telephone Encounter (Signed)
Pt is requesting a zpack due to a COPD flare up. She is experiencing sore throat, mucus, wheezing, fatigue, dry cough, headache, shortness of breath. Pt said she knows exactly what is wrong she has dad it to many times before.      Offered triage nurse, pt decline she does not want to go sit at the ER that would not be good for her health.

## 2019-05-29 NOTE — Telephone Encounter (Signed)
Spoke with pt, aware of recs.  Scheduled televisit with TP this afternoon at 4:00.  Nothing further needed at this time- will close encounter.

## 2019-05-29 NOTE — Progress Notes (Signed)
Virtual Visit via Telephone Note  I connected with Laurie Schaefer on 05/29/19 at  4:00 PM EDT by telephone and verified that I am speaking with the correct person using two identifiers.  Location: Patient: Home  Provider:Home    I discussed the limitations, risks, security and privacy concerns of performing an evaluation and management service by telephone and the availability of in person appointments. I also discussed with the patient that there may be a patient responsible charge related to this service. The patient expressed understanding and agreed to proceed.   History of Present Illness: 64 year old female former smoker followed for COPD and chronic respiratory failure on nocturnal oxygen at 2 L  Today's televisit is an acute office visit for COPD.  Patient complains over the last 5 days she has had increased cough wheezing and shortness of breath.  She remains on Symbicort and Spiriva.  She is had to increase her albuterol use.  Patient says she is been cleaning out her father's house and has been exposed to a lot of dust.  Her father recently passed away.  Patient complains of a congested cough with thick mucus.  Patient has received both of her Covid vaccines.  She denies any fever chest pain orthopnea PND or increased leg swelling  Patient Active Problem List   Diagnosis Date Noted  . Chest wall pain 12/08/2018  . Noncompliance 12/08/2018  . COPD with acute exacerbation ([place]) 02/04/2018  . Fatigue 12/08/2017  . Preventative health care 12/08/2017  . Atelectasis 11/25/2017  . Cough variant asthma vs uacs/vcd 07/18/2017  . DOE (dyspnea on exertion) 07/16/2017  . Fibromyalgia 05/03/2017  . Migraines 04/12/2017  . Chronic respiratory failure with hypoxia ([place]) 04/01/2017  . Allergic rhinitis 03/13/2016  . Vocal cord dysfunction 01/29/2016  . COPD (chronic obstructive pulmonary disease) ([place]) 12/19/2015  . Obesity 12/19/2015  . Bipolar I disorder ([employer]) 08/27/2015  .  INSOMNIA, CHRONIC 11/03/2006  . Depression 11/03/2006  . Essential hypertension 11/03/2006  . MENOPAUSAL SYNDROME 11/03/2006  . Osteoarthritis 11/03/2006   Current Outpatient Medications on File Prior to Visit  Medication Sig Dispense Refill  . amLODipine (NORVASC) 5 MG tablet Take 1 tablet (5 mg total) by mouth daily. 90 tablet 3  . azithromycin (ZITHROMAX) 250 MG tablet Take as directed 6 tablet 0  . cholecalciferol (VITAMIN D) 1000 units tablet Take 1,000 Units by mouth daily.    Marland Kitchen estradiol (ESTRACE) 1 MG tablet TAKE 1 TABLET EVERY DAY 90 tablet 3  . Fexofenadine HCl (MUCINEX ALLERGY PO) Take 1 tablet by mouth daily as needed (allergies/congestion.).     Marland Kitchen fluticasone (FLONASE) 50 MCG/ACT nasal spray Place 2 sprays into both nostrils daily. 16 g 5  . ibuprofen (ADVIL) 800 MG tablet Take 1 tablet (800 mg total) by mouth every 6 (six) hours as needed. for pain 120 tablet 5  . lamoTRIgine (LAMICTAL) 25 MG tablet TAKE 1 TABLET TWICE DAILY 180 tablet 3  . levalbuterol (XOPENEX) 0.63 MG/3ML nebulizer solution Take 3 mLs (0.63 mg total) by nebulization every 4 (four) hours as needed for wheezing or shortness of breath. 810 mL 0  . linaclotide (LINZESS) 145 MCG CAPS capsule Take 1 capsule (145 mcg total) by mouth daily before breakfast. 30 capsule 5  . LORazepam (ATIVAN) 2 MG tablet TAKE 1 TABLET(2 MG) BY MOUTH EVERY 8 HOURS AS NEEDED FOR ANXIETY 270 tablet 1  . losartan-hydrochlorothiazide (HYZAAR) 100-25 MG tablet Take 1 tablet by mouth daily. 90 tablet 3  . medroxyPROGESTERone (PROVERA)  2.5 MG tablet Take 1 tablet (2.5 mg total) by mouth daily. 90 tablet 3  . OXYGEN 3lpm with sleep and exertion if needed    . pantoprazole (PROTONIX) 40 MG tablet Take 1 tablet (40 mg total) by mouth daily. 90 tablet 3  . QUEtiapine (SEROQUEL) 25 MG tablet Take 1 tablet (25 mg total) by mouth every 6 (six) hours as needed. 360 tablet 1  . traZODone (DESYREL) 100 MG tablet Take 1 tablet (100 mg total) by mouth at  bedtime. 30 tablet 11  . zolpidem (AMBIEN) 10 MG tablet TAKE 1 TABLET BY MOUTH EVERY NIGHT AT BEDTIME AS NEEDED SLEEP 90 tablet 1  . [DISCONTINUED] buPROPion (WELLBUTRIN XL) 300 MG 24 hr tablet Take 1 tablet (300 mg total) by mouth daily. 90 tablet 3   No current facility-administered medications on file prior to visit.     Observations/Objective: Speaks in full sentences with no audible wheezing or distress  Assessment and Plan: Acute COPD exacerbation  Plan  Patient Instructions  Doxycycline 100mg  Twice daily  For 1 week . Take with food.  Prednisone taper over next week.  Mucinex DM Twice daily  As needed  Cough/congestion  Tessalon Three times a day  As needed   Refills for Symbicort and Spiriva , Albuterol .  Follow up with NP in 3 months and As needed   Please contact office for sooner follow up if symptoms do not improve or worsen or seek emergency care         Follow Up Instructions: Follow up in 3 months and As needed   Please contact office for sooner follow up if symptoms do not improve or worsen or seek emergency care     I discussed the assessment and treatment plan with the patient. The patient was provided an opportunity to ask questions and all were answered. The patient agreed with the plan and demonstrated an understanding of the instructions.   The patient was advised to call back or seek an in-person evaluation if the symptoms worsen or if the condition fails to improve as anticipated.  I provided 21  minutes of non-face-to-face time during this encounter.   Jefm Bryant, NP

## 2019-05-29 NOTE — Patient Instructions (Addendum)
Doxycycline 100mg  Twice daily  For 1 week . Take with food.  Prednisone taper over next week.  Mucinex DM Twice daily  As needed  Cough/congestion  Tessalon Three times a day  As needed   Refills for Symbicort and Spiriva , Albuterol .  Follow up with NP in 3 months and As needed   Please contact office for sooner follow up if symptoms do not improve or worsen or seek emergency care

## 2019-05-29 NOTE — Telephone Encounter (Signed)
Pt c/o copd exacerbation: fatigue, sore throat, low grade temp, chest tightness, nonprod hacking cough.  Pt states she was cleaning out an old empty house last week, s/s present X2 days.    Denies mucus production, sinus congestion, loss of taste/smell, HA.    Has been taking OTC cough syrup with expectorant, nebulized albuterol, maintenance Spiriva and Symbicort, drinking lots of water.  Pharmacy: [facility] on [address].   Pt was offered appt but declined, stating she only wanted to see SG but she is not in clinic until Wednesday.  Pt requesting an abx and pred taper.    Sending to APP since SG and MW are both not in office today.  Laurie Schaefer please advise on recs.  Thanks!

## 2019-05-29 NOTE — Telephone Encounter (Signed)
Message Routed to PCP  for approval. 

## 2019-05-29 NOTE — Telephone Encounter (Signed)
Patient needs virtual visit with APP or Dr. Sherene Sires.  I can respect the fact that she wants to see Laurie Schaefer and we can coordinate this on Wednesday.  But if she is a symptomatic she likely needs to be willing to see a different provider prior to that.  Laurie Headland FNP

## 2019-05-30 MED ORDER — BUDESONIDE-FORMOTEROL FUMARATE 160-4.5 MCG/ACT IN AERO: 2.0000 | INHALATION_SPRAY | Freq: Two times a day (BID) | RESPIRATORY_TRACT | 1 refills | Status: DC

## 2019-05-30 MED ORDER — AZITHROMYCIN 250 MG PO TABS: ORAL_TABLET | ORAL | 0 refills | Status: DC

## 2019-05-30 MED ORDER — SPIRIVA RESPIMAT 2.5 MCG/ACT IN AERS: 2.0000 | INHALATION_SPRAY | Freq: Every day | RESPIRATORY_TRACT | 1 refills | Status: DC

## 2019-05-30 NOTE — Telephone Encounter (Signed)
Please call in a Zpack  °

## 2019-06-04 DIAGNOSIS — J9611 Chronic respiratory failure with hypoxia: Secondary | ICD-10-CM | POA: Diagnosis not present

## 2019-06-05 DIAGNOSIS — J439 Emphysema, unspecified: Secondary | ICD-10-CM | POA: Diagnosis not present

## 2019-06-05 DIAGNOSIS — J9611 Chronic respiratory failure with hypoxia: Secondary | ICD-10-CM | POA: Diagnosis not present

## 2019-06-05 DIAGNOSIS — F319 Bipolar disorder, unspecified: Secondary | ICD-10-CM | POA: Diagnosis not present

## 2019-06-05 DIAGNOSIS — J301 Allergic rhinitis due to pollen: Secondary | ICD-10-CM | POA: Diagnosis not present

## 2019-06-09 ENCOUNTER — Other Ambulatory Visit: Payer: Self-pay | Admitting: Acute Care

## 2019-07-05 DIAGNOSIS — J9611 Chronic respiratory failure with hypoxia: Secondary | ICD-10-CM | POA: Diagnosis not present

## 2019-07-06 ENCOUNTER — Encounter: Payer: Self-pay | Admitting: Family Medicine

## 2019-07-06 DIAGNOSIS — J439 Emphysema, unspecified: Secondary | ICD-10-CM | POA: Diagnosis not present

## 2019-07-06 DIAGNOSIS — J301 Allergic rhinitis due to pollen: Secondary | ICD-10-CM | POA: Diagnosis not present

## 2019-07-06 DIAGNOSIS — F319 Bipolar disorder, unspecified: Secondary | ICD-10-CM | POA: Diagnosis not present

## 2019-07-06 DIAGNOSIS — J9611 Chronic respiratory failure with hypoxia: Secondary | ICD-10-CM | POA: Diagnosis not present

## 2019-07-12 ENCOUNTER — Ambulatory Visit (INDEPENDENT_AMBULATORY_CARE_PROVIDER_SITE_OTHER): Payer: Medicare HMO | Admitting: Family Medicine

## 2019-07-12 ENCOUNTER — Encounter: Payer: Self-pay | Admitting: Family Medicine

## 2019-07-12 ENCOUNTER — Other Ambulatory Visit: Payer: Self-pay

## 2019-07-12 VITALS — BP 130/64 | HR 90 | Temp 97.8°F | Wt 215.8 lb

## 2019-07-12 DIAGNOSIS — R739 Hyperglycemia, unspecified: Secondary | ICD-10-CM

## 2019-07-12 DIAGNOSIS — Z0000 Encounter for general adult medical examination without abnormal findings: Secondary | ICD-10-CM

## 2019-07-12 DIAGNOSIS — E6609 Other obesity due to excess calories: Secondary | ICD-10-CM

## 2019-07-12 DIAGNOSIS — J439 Emphysema, unspecified: Secondary | ICD-10-CM

## 2019-07-12 DIAGNOSIS — Z6832 Body mass index (BMI) 32.0-32.9, adult: Secondary | ICD-10-CM | POA: Diagnosis not present

## 2019-07-12 LAB — CBC WITH DIFFERENTIAL/PLATELET
Basophils Absolute: 0.1 10*3/uL (ref 0.0–0.1)
Basophils Relative: 0.8 % (ref 0.0–3.0)
Eosinophils Absolute: 0.2 10*3/uL (ref 0.0–0.7)
Eosinophils Relative: 2.6 % (ref 0.0–5.0)
HCT: 38.4 % (ref 36.0–46.0)
Hemoglobin: 13 g/dL (ref 12.0–15.0)
Lymphocytes Relative: 29.5 % (ref 12.0–46.0)
Lymphs Abs: 2.2 10*3/uL (ref 0.7–4.0)
MCHC: 33.7 g/dL (ref 30.0–36.0)
MCV: 86 fl (ref 78.0–100.0)
Monocytes Absolute: 0.5 10*3/uL (ref 0.1–1.0)
Monocytes Relative: 7.3 % (ref 3.0–12.0)
Neutro Abs: 4.5 10*3/uL (ref 1.4–7.7)
Neutrophils Relative %: 59.8 % (ref 43.0–77.0)
Platelets: 248 10*3/uL (ref 150.0–400.0)
RBC: 4.47 Mil/uL (ref 3.87–5.11)
RDW: 13.8 % (ref 11.5–15.5)
WBC: 7.5 10*3/uL (ref 4.0–10.5)

## 2019-07-12 LAB — BASIC METABOLIC PANEL
BUN: 12 mg/dL (ref 6–23)
CO2: 31 mEq/L (ref 19–32)
Calcium: 9.2 mg/dL (ref 8.4–10.5)
Chloride: 102 mEq/L (ref 96–112)
Creatinine, Ser: 0.67 mg/dL (ref 0.40–1.20)
GFR: 88.24 mL/min (ref >60.00)
Glucose, Bld: 101 mg/dL — ABNORMAL HIGH (ref 70–99)
Potassium: 4 mEq/L (ref 3.5–5.1)
Sodium: 140 mEq/L (ref 135–145)

## 2019-07-12 LAB — LIPID PANEL
Cholesterol: 191 mg/dL (ref 0–200)
HDL: 39.5 mg/dL (ref >39.00)
NonHDL: 151.01
Total CHOL/HDL Ratio: 5
Triglycerides: 240 mg/dL — ABNORMAL HIGH (ref 0.0–149.0)
VLDL: 48 mg/dL — ABNORMAL HIGH (ref 0.0–40.0)

## 2019-07-12 LAB — HEPATIC FUNCTION PANEL
ALT: 16 U/L (ref 0–35)
AST: 19 U/L (ref 0–37)
Albumin: 3.9 g/dL (ref 3.5–5.2)
Alkaline Phosphatase: 59 U/L (ref 39–117)
Bilirubin, Direct: 0.1 mg/dL (ref 0.0–0.3)
Total Bilirubin: 0.7 mg/dL (ref 0.2–1.2)
Total Protein: 6.4 g/dL (ref 6.0–8.3)

## 2019-07-12 LAB — LDL CHOLESTEROL, DIRECT: Direct LDL: 116 mg/dL

## 2019-07-12 LAB — T3, FREE: T3, Free: 3.3 pg/mL (ref 2.3–4.2)

## 2019-07-12 LAB — HEMOGLOBIN A1C: Hgb A1c MFr Bld: 5.8 % (ref 4.6–6.5)

## 2019-07-12 LAB — T4, FREE: Free T4: 0.69 ng/dL (ref 0.60–1.60)

## 2019-07-12 LAB — TSH: TSH: 0.46 u[IU]/mL (ref 0.35–4.50)

## 2019-07-12 MED ORDER — PANTOPRAZOLE SODIUM 40 MG PO TBEC: 40.0000 mg | DELAYED_RELEASE_TABLET | Freq: Every day | ORAL | 3 refills | Status: DC

## 2019-07-12 MED ORDER — ZOLPIDEM TARTRATE 10 MG PO TABS: ORAL_TABLET | ORAL | 1 refills | Status: DC

## 2019-07-12 MED ORDER — QUETIAPINE FUMARATE 50 MG PO TABS
50.0000 mg | ORAL_TABLET | Freq: Two times a day (BID) | ORAL | 3 refills | Status: DC
Start: 2019-07-12 — End: 2021-02-19

## 2019-07-12 MED ORDER — LORAZEPAM 2 MG PO TABS: ORAL_TABLET | ORAL | 1 refills | Status: DC

## 2019-07-12 NOTE — Progress Notes (Signed)
Subjective:    Patient ID: Laurie Schaefer, female    DOB: 19-Sep-1955, 64 y.o.   MRN: [id number]  HPI Here with her partner for a well exam. She has been struggling with all her medical issues. She sees Dr. Delton Coombes for her COPD , and she wears continuous [place] oxygen. She uses 2 liters at night and 3 liters during the day. She uses her nebulizer almost daily as well. She still feels very fatigued all the time and she cannot walk very far without having to sit down to rest. Her fibromyalgia has been much worse lately and her pain levels are not well controlled. Her depression has been worse lately as well, and she cries a lot. She does not sleep well. She denies any suicidal thoughts however, and she has no hallucinations. Her BP is stable. She has received full Covid vaccinations. She tries to follow a COPD diet by avoiding dairy products etc, but she finds this very difficult. She asks if she can meet with a Nutritionist about this. She also complains of muscle cramps, especially in the calves. These come at night mostly.    Review of Systems  Constitutional: Positive for fatigue.  HENT: Negative.   Eyes: Negative.   Respiratory: Positive for shortness of breath.   Cardiovascular: Negative.   Gastrointestinal: Negative.   Genitourinary: Negative for decreased urine volume, difficulty urinating, dyspareunia, dysuria, enuresis, flank pain, frequency, hematuria, pelvic pain and urgency.  Musculoskeletal: Positive for myalgias.  Skin: Negative.   Neurological: Negative.   Psychiatric/Behavioral: Positive for agitation, decreased concentration, dysphoric mood and sleep disturbance. Negative for behavioral problems, confusion, hallucinations, self-injury and suicidal ideas. The patient is nervous/anxious.        Objective:   Physical Exam Constitutional:      General: She is not in acute distress.    Appearance: She is well-developed. She is obese.  HENT:     Head: Normocephalic and  atraumatic.     Right Ear: External ear normal.     Left Ear: External ear normal.     Nose: Nose normal.     Mouth/Throat:     Pharynx: No oropharyngeal exudate.  Eyes:     General: No scleral icterus.    Conjunctiva/sclera: Conjunctivae normal.     Pupils: Pupils are equal, round, and reactive to light.  Neck:     Thyroid: No thyromegaly.     Vascular: No JVD.  Cardiovascular:     Rate and Rhythm: Normal rate and regular rhythm.     Pulses: Normal pulses.     Heart sounds: Normal heart sounds. No murmur. No friction rub. No gallop.   Pulmonary:     Effort: Pulmonary effort is normal. No respiratory distress.     Breath sounds: Normal breath sounds. No wheezing or rales.  Chest:     Chest wall: No tenderness.  Abdominal:     General: Abdomen is flat. Bowel sounds are normal. There is no distension.     Palpations: Abdomen is soft. There is no mass.     Tenderness: There is no abdominal tenderness. There is no guarding or rebound.  Musculoskeletal:        General: No tenderness. Normal range of motion.     Cervical back: Normal range of motion and neck supple.  Lymphadenopathy:     Cervical: No cervical adenopathy.  Skin:    General: Skin is warm and dry.     Findings: No erythema or rash.  Neurological:  Mental Status: She is alert and oriented to person, place, and time.     Cranial Nerves: No cranial nerve deficit.     Motor: No abnormal muscle tone.     Coordination: Coordination normal.     Deep Tendon Reflexes: Reflexes are normal and symmetric. Reflexes normal.  Psychiatric:        Mood and Affect: Mood normal.        Behavior: Behavior normal.        Thought Content: Thought content normal.        Judgment: Judgment normal.           Assessment & Plan:  Well exam. We discussed diet and exercise. We will refer her to Nutrition to help with obesity and COPD. Get fasting labs today. For her fibromyalgia, we agreed that she will set up a pain management  visit soon to evaluate this issue. For the cramps, I suggested she take 800 mg of magnesium every evening before bed. For the bipolar depression, we will stop the Lamictal and increase the Seroquel to 50 mg BID. She will report back in 3-4 weeks.  Alysia Penna, MD

## 2019-07-13 ENCOUNTER — Ambulatory Visit: Payer: Medicare HMO | Admitting: Family Medicine

## 2019-07-13 ENCOUNTER — Encounter: Payer: Self-pay | Admitting: Family Medicine

## 2019-07-14 ENCOUNTER — Other Ambulatory Visit: Payer: Self-pay

## 2019-07-14 ENCOUNTER — Telehealth (INDEPENDENT_AMBULATORY_CARE_PROVIDER_SITE_OTHER): Payer: Medicare HMO | Admitting: Family Medicine

## 2019-07-14 ENCOUNTER — Encounter: Payer: Self-pay | Admitting: Family Medicine

## 2019-07-14 DIAGNOSIS — M797 Fibromyalgia: Secondary | ICD-10-CM | POA: Diagnosis not present

## 2019-07-14 MED ORDER — OXYCODONE-ACETAMINOPHEN 10-325 MG PO TABS: 1.0000 | ORAL_TABLET | Freq: Four times a day (QID) | ORAL | 0 refills | Status: DC | PRN

## 2019-07-14 MED ORDER — OXYCODONE-ACETAMINOPHEN 10-325 MG PO TABS: 1.0000 | ORAL_TABLET | Freq: Four times a day (QID) | ORAL | 0 refills | Status: AC | PRN

## 2019-07-14 NOTE — Progress Notes (Signed)
   Subjective:    Patient ID: Laurie Schaefer, female    DOB: 1955/07/03, 64 y.o.   MRN: [id number]  HPI Virtual Visit via Telephone Note  I connected with the patient on 07/14/19 at 10:30 AM EDT by telephone and verified that I am speaking with the correct person using two identifiers.   I discussed the limitations, risks, security and privacy concerns of performing an evaluation and management service by telephone and the availability of in person appointments. I also discussed with the patient that there may be a patient responsible charge related to this service. The patient expressed understanding and agreed to proceed.  Location patient: home Location provider: work or home office Participants present for the call: patient, provider Patient did not have a visit in the prior 7 days to address this/these issue(s).   History of Present Illness: Here for pain management. Her pain levels have been higher lately and she asks to try something a little stronger. Indication for chronic opioid: fibromyalgia Medication and dose: Norco 10-325 # pills per month: 120 Last UDS date: 04-30-17 Opioid Treatment Agreement signed (Y/N): 04-30-17 Opioid Treatment Agreement last reviewed with patient:  07-14-19 N[facility]SRS reviewed this encounter (include red flags): Yes    Observations/Objective: Patient sounds cheerful and well on the phone. I do not appreciate any SOB. Speech and thought processing are grossly intact. Patient reported vitals:  Assessment and Plan: Pain mangement. We will switch to Percocet 10-325 as needed.  Gershon Crane, MD   Follow Up Instructions:     [phone] 5-10 [phone] 11-20 [id number] 21-30 I did not refer this patient for an OV in the next 24 hours for this/these issue(s).  I discussed the assessment and treatment plan with the patient. The patient was provided an opportunity to ask questions and all were answered. The patient agreed with the plan and demonstrated an  understanding of the instructions.   The patient was advised to call back or seek an in-person evaluation if the symptoms worsen or if the condition fails to improve as anticipated.  I provided 13 minutes of non-face-to-face time during this encounter.   Gershon Crane, MD   Review of Systems     Objective:   Physical Exam        Assessment & Plan:

## 2019-07-14 NOTE — Telephone Encounter (Signed)
We covered this during our visit today

## 2019-07-17 ENCOUNTER — Encounter: Payer: Self-pay | Admitting: Family Medicine

## 2019-07-21 ENCOUNTER — Telehealth: Payer: Self-pay | Admitting: Family Medicine

## 2019-07-21 NOTE — Telephone Encounter (Signed)
Left message for patient to call back  

## 2019-07-21 NOTE — Telephone Encounter (Signed)
[facility] states that patient's medication needs prior approval-- pt thinks she is changing to [employer].  Patient is requesting a call back.

## 2019-07-25 NOTE — Telephone Encounter (Signed)
Left message for patient to call back  

## 2019-08-01 NOTE — Telephone Encounter (Signed)
Unable to reach the patient. Message will be closed.  

## 2019-08-04 DIAGNOSIS — J9611 Chronic respiratory failure with hypoxia: Secondary | ICD-10-CM | POA: Diagnosis not present

## 2019-08-05 DIAGNOSIS — J301 Allergic rhinitis due to pollen: Secondary | ICD-10-CM | POA: Diagnosis not present

## 2019-08-05 DIAGNOSIS — F319 Bipolar disorder, unspecified: Secondary | ICD-10-CM | POA: Diagnosis not present

## 2019-08-05 DIAGNOSIS — J439 Emphysema, unspecified: Secondary | ICD-10-CM | POA: Diagnosis not present

## 2019-08-05 DIAGNOSIS — J9611 Chronic respiratory failure with hypoxia: Secondary | ICD-10-CM | POA: Diagnosis not present

## 2019-09-04 DIAGNOSIS — J9611 Chronic respiratory failure with hypoxia: Secondary | ICD-10-CM | POA: Diagnosis not present

## 2019-09-05 DIAGNOSIS — J439 Emphysema, unspecified: Secondary | ICD-10-CM | POA: Diagnosis not present

## 2019-09-05 DIAGNOSIS — J9611 Chronic respiratory failure with hypoxia: Secondary | ICD-10-CM | POA: Diagnosis not present

## 2019-09-05 DIAGNOSIS — F319 Bipolar disorder, unspecified: Secondary | ICD-10-CM | POA: Diagnosis not present

## 2019-09-05 DIAGNOSIS — J301 Allergic rhinitis due to pollen: Secondary | ICD-10-CM | POA: Diagnosis not present

## 2019-09-06 ENCOUNTER — Ambulatory Visit: Payer: Medicare HMO | Admitting: Acute Care

## 2019-09-06 ENCOUNTER — Encounter: Payer: Self-pay | Admitting: Acute Care

## 2019-09-06 ENCOUNTER — Other Ambulatory Visit: Payer: Self-pay

## 2019-09-06 VITALS — BP 128/84 | HR 76 | Temp 97.8°F | Ht 65.0 in | Wt 218.0 lb

## 2019-09-06 DIAGNOSIS — J441 Chronic obstructive pulmonary disease with (acute) exacerbation: Secondary | ICD-10-CM | POA: Diagnosis not present

## 2019-09-06 DIAGNOSIS — R000 Tachycardia, unspecified: Secondary | ICD-10-CM | POA: Diagnosis not present

## 2019-09-06 DIAGNOSIS — G4734 Idiopathic sleep related nonobstructive alveolar hypoventilation: Secondary | ICD-10-CM | POA: Diagnosis not present

## 2019-09-06 MED ORDER — LEVALBUTEROL HCL 0.31 MG/3ML IN NEBU
1.0000 | INHALATION_SOLUTION | RESPIRATORY_TRACT | 12 refills | Status: DC | PRN
Start: 2019-09-06 — End: 2020-01-15

## 2019-09-06 NOTE — Progress Notes (Signed)
History of Present Illness Laurie Schaefer is a 64 y.o. female former smoker followed for COPD, vocal cord dysfunction   and chronic respiratory failure on nocturnal oxygen at 2 L . She is followed by Dr. Delton Coombes.   09/12/2019 Follow up. Pt. Presents for follow up. She was last seen virtually for a COPD exacerbation 05/29/2019. Marland Kitchen At that time she was taking Symbicort and Spiriva. She was having increased albuterol use. She was treated with Doxycycline and a prednisone taper. She states she did better after the treatment. She states she had a episode of tachycardia at home that occurred after using her Symbicort  and Spiriva and  after using her  her nebs. She has had several episodes over the last 2 months. She was seen by her PCP, but she has not had an EKG or a referral to cards.  She states she has not been taking her Symbicort and Spiriva for 1 month, and as a results she has had shortness of breath with tachycardia. .  She has not had weight gain over the last year. She is using her night time oxygen. She states she does have very thick mucus. She states she has had a lot of sweating to her face , head and neck. This does self resolve. Sats today after ambulating back to the office were 96%.  Test Results:  CBC Latest Ref Rng & Units 07/12/2019 08/19/2017 07/16/2017  WBC 4.0 - 10.5 K/uL 7.5 6.5 9.1  Hemoglobin 12.0 - 15.0 g/dL 18.8 41.6 60.6  Hematocrit 36 - 46 % 38.4 38.9 36.5  Platelets 150 - 400 K/uL 248.0 266 328.0    BMP Latest Ref Rng & Units 07/12/2019 08/19/2017 07/16/2017  Glucose 70 - 99 mg/dL 301(S) 010(X) 89  BUN 6 - 23 mg/dL 12 13 10   Creatinine 0.40 - 1.20 mg/dL 3.23 5.57  Sodium 135 - 145 mEq/L 140 142 140  Potassium 3.5 - 5.1 mEq/L 4.0 3.9 3.7  Chloride 96 - 112 mEq/L 102 106 106  CO2 19 - 32 mEq/L 31 28 27   Calcium 8.4 - 10.5 mg/dL 9.2 9.0 8.5    BNP    Component Value Date/Time   BNP 24.6 08/19/2017 1107    ProBNP    Component Value Date/Time   PROBNP  74.0 07/16/2017 1549    PFT    Component Value Date/Time   FEV1PRE 1.12 08/30/2017 0957   FEV1POST 1.02 08/30/2017 0957   FVCPRE 1.78 08/30/2017 0957   FVCPOST 1.84 08/30/2017 0957   TLC 4.10 08/30/2017 0957   DLCOUNC 15.88 08/30/2017 0957   PREFEV1FVCRT 63 08/30/2017 0957   PSTFEV1FVCRT 55 08/30/2017 0957    No results found.   Past medical hx Past Medical History:  Diagnosis Date  . Arthritis   . Bipolar disorder (HCC)   . Chronic neck pain    had previously seen Dr. 09/01/2017 at [facility]   . COPD (chronic obstructive pulmonary disease) (HCC)    sees Dr. Salli Real   . Depression   . Fibromyalgia   . Fibromyalgia   . Headache   . Hypertension      Social History   Tobacco Use  . Smoking status: Former Smoker    Packs/day: 0.80    Years: 25.00    Pack years: 20.00    Types: Cigarettes    Quit date: 03/09/1990    Years since quitting: 29.5  . Smokeless tobacco: Never Used  . Tobacco comment: 1 cig every  2 weeks  Substance Use Topics  . Alcohol use: No    Alcohol/week: 0.0 standard drinks  . Drug use: No    Ms.Bozzi reports that she quit smoking about 29 years ago. Her smoking use included cigarettes. She has a 20.00 pack-year smoking history. She has never used smokeless tobacco. She reports that she does not drink alcohol and does not use drugs.  Tobacco Cessation: Former smoker with a 20 pack year smoking history.  She quit in 1992  Past surgical hx, Family hx, Social hx all reviewed.  Current Outpatient Medications on File Prior to Visit  Medication Sig  . albuterol (VENTOLIN HFA) 108 (90 Base) MCG/ACT inhaler INHALE 2 PUFFS INTO THE LUNGS EVERY 6 HOURS AS NEEDED FOR WHEEZING OR SHORTNESS OF BREATH  . amLODipine (NORVASC) 5 MG tablet Take 1 tablet (5 mg total) by mouth daily.  . budesonide-formoterol (SYMBICORT) 160-4.5 MCG/ACT inhaler Inhale 2 puffs into the lungs 2 (two) times daily. (Patient not taking: Reported on 09/06/2019)  .  cholecalciferol (VITAMIN D) 1000 units tablet Take 1,000 Units by mouth daily.  Marland Kitchen estradiol (ESTRACE) 1 MG tablet TAKE 1 TABLET EVERY DAY  . Fexofenadine HCl (MUCINEX ALLERGY PO) Take 1 tablet by mouth daily as needed (allergies/congestion.).   Marland Kitchen FLUAD QUADRIVALENT 0.5 ML injection   . fluticasone (FLONASE) 50 MCG/ACT nasal spray Place 2 sprays into both nostrils daily.  Marland Kitchen ibuprofen (ADVIL) 800 MG tablet Take 1 tablet (800 mg total) by mouth every 6 (six) hours as needed. for pain  . levalbuterol (XOPENEX) 0.63 MG/3ML nebulizer solution INHALE BY MOUTH VIA NEBULIZER EVERY 4 HOURS AS NEEDED FOR WHEEZE OR SHORTNESS OF BREATH  . linaclotide (LINZESS) 145 MCG CAPS capsule Take 1 capsule (145 mcg total) by mouth daily before breakfast.  . LORazepam (ATIVAN) 2 MG tablet TAKE 1 TABLET(2 MG) BY MOUTH EVERY 8 HOURS AS NEEDED FOR ANXIETY  . losartan-hydrochlorothiazide (HYZAAR) 100-25 MG tablet Take 1 tablet by mouth daily.  . medroxyPROGESTERone (PROVERA) 2.5 MG tablet Take 1 tablet (2.5 mg total) by mouth daily.  Melene Muller ON 09/13/2019] oxyCODONE-acetaminophen (PERCOCET) 10-325 MG tablet Take 1 tablet by mouth every 6 (six) hours as needed for pain.  . OXYGEN 3lpm with sleep and exertion if needed  . pantoprazole (PROTONIX) 40 MG tablet Take 1 tablet (40 mg total) by mouth daily.  Marland Kitchen PREVNAR 13 SUSP injection   . QUEtiapine (SEROQUEL) 50 MG tablet Take 1 tablet (50 mg total) by mouth 2 (two) times daily.  . Tiotropium Bromide Monohydrate (SPIRIVA RESPIMAT) 2.5 MCG/ACT AERS Inhale 2 puffs into the lungs daily. (Patient not taking: Reported on 09/06/2019)  . traZODone (DESYREL) 100 MG tablet Take 1 tablet (100 mg total) by mouth at bedtime.  Marland Kitchen zolpidem (AMBIEN) 10 MG tablet TAKE 1 TABLET BY MOUTH EVERY NIGHT AT BEDTIME AS NEEDED SLEEP  . [DISCONTINUED] buPROPion (WELLBUTRIN XL) 300 MG 24 hr tablet Take 1 tablet (300 mg total) by mouth daily.   No current facility-administered medications on file prior to  visit.     Allergies  Allergen Reactions  . Codeine Nausea Only    Review Of Systems:  Constitutional:   No  weight loss, night sweats,  Fevers, chills, fatigue, or  lassitude.  HEENT:   No headaches,  Difficulty swallowing,  Tooth/dental problems, or  Sore throat,                No sneezing, itching, ear ache, nasal congestion, post nasal drip,  CV:  No chest pain,  Orthopnea, PND, swelling in lower extremities, anasarca, dizziness, + palpitations, syncope.   GI  No heartburn, indigestion, abdominal pain, nausea, vomiting, diarrhea, change in bowel habits, loss of appetite, bloody stools.   Resp: + shortness of breath with exertion less at rest.  + excess mucus, + productive cough,  No non-productive cough,  No coughing up of blood.  No change in color of mucus.  + wheezing.  No chest wall deformity  Skin: no rash or lesions.  GU: no dysuria, change in color of urine, no urgency or frequency.  No flank pain, no hematuria   MS:  No joint pain or swelling.  No decreased range of motion.  No back pain.  Psych:  No change in mood or affect. No depression or anxiety.  No memory loss.   Vital Signs BP 128/84 (BP Location: Left Arm, Cuff Size: Normal)   Pulse 76   Temp 97.8 F (36.6 C) (Oral)   Ht 5\' 5"  (1.651 m)   Wt 218 lb (98.9 kg)   SpO2 96%   BMI 36.28 kg/m    Physical Exam:  General- No distress,  A&Ox3, pleasant ENT: No sinus tenderness, TM clear, pale nasal mucosa, no oral exudate,no post nasal drip, no LAN Cardiac: S1, S2, regular rate and rhythm, no murmur Chest: No wheeze/ rales/ dullness; no accessory muscle use, no nasal flaring, no sternal retractions Abd.: Soft Non-tender, ND, BS + Ext: No clubbing cyanosis, trace edema Neuro:  normal strength, MAE x 4, A&O x 3 Skin: No rashes, No lesions, warm and dry Psych: normal mood and behavior   Assessment/Plan  COPD Plan Restart your Symbicort at 1 puff twice daily Resume your Spiriva 1 puffs once  daily. Rinse mouth after use. If you can tolerate using the half dose inhalers, please increase back to Symbicort 2 puffs twice daily, and Spiriva 2 puffs once daily Try and separate your rescue inhaler and your maintenance inhaler by 4 hours.  Continue Mucinex 1200 mg daily Take with a full glass of water.  Follow with flutter valve 4 puffs 4 times daily Follow up with NP or Dr. Maralyn Sago in 3 months. Please contact office for sooner follow up if symptoms do not improve or worsen or seek emergency care  We will prescribe Xopenex as your rescue inhaler and nebulizer treatments Use as needed for breakthrough shortness of breath or wheezing.   Nocturnal Hypoxemia Plan Continue using Nocturnal oxygen at 2 L [place]    Tachycardia Plan We will refer back to [facility]iology Seek emergency care if tachycardia does not resolve or if you develop chest pain  [facility], NP 09/12/2019  11:43 AM

## 2019-09-06 NOTE — Patient Instructions (Addendum)
It is good to see you today. We will refer you back to [facility] cardiology to evaluate your tachycardia.( Urgent appointment) We will prescribe Xopenex as your rescue inhaler and nebulizer treatments Use as needed for breakthrough shortness of breath or wheezing. Please ask Dr. Clent Ridges to refer you to the Pain management  clinic for worsening pain due to your fibromyalgia. Restart your Symbicort at 1 puff twice daily Resume your Spiriva 1 puffs once daily. Rinse mouth after use. If you can tolerate using the half dose inhalers, please increase back to Symbicort 2 puffs twice daily, and Spiriva 2 puffs once daily Try and separate your rescue inhaler and your maintenance inhaler by 4 hours.  Continue Mucinex 1200 mg daily Take with a full glass of water.  Follow with flutter valve 4 puffs 4 times daily Follow up with Maralyn Sago NP or Dr. Delton Coombes in 3 months. Please contact office for sooner follow up if symptoms do not improve or worsen or seek emergency care

## 2019-09-12 ENCOUNTER — Encounter: Payer: Self-pay | Admitting: Acute Care

## 2019-10-04 DIAGNOSIS — J9611 Chronic respiratory failure with hypoxia: Secondary | ICD-10-CM | POA: Diagnosis not present

## 2019-10-04 NOTE — Progress Notes (Deleted)
Cardiology Office Note:   Date:  10/04/2019  NAME:  Laurie Schaefer    MRN: [id number] DOB:  1955/04/11   PCP:  Laurie Salisbury, MD  Cardiologist:  No primary care provider on file.  Electrophysiologist:  None   Referring MD: Laurie Ngo, NP   No chief complaint on file. ***  History of Present Illness:   Laurie Schaefer is a 64 y.o. female with a hx of COPD, HTN who is being seen today for the evaluation of palpitations at the request of Laurie Salisbury, MD. Echo 2019 normal.   Problem List 1. Moderate COPD 2. HTN  Past Medical History: Past Medical History:  Diagnosis Date  . Arthritis   . Bipolar disorder (HCC)   . Chronic neck pain    had previously seen Dr. Salli Schaefer at [facility]   . COPD (chronic obstructive pulmonary disease) (HCC)    sees Dr. Delton Schaefer   . Depression   . Fibromyalgia   . Fibromyalgia   . Headache   . Hypertension     Past Surgical History: Past Surgical History:  Procedure Laterality Date  . FRACTURE SURGERY      Current Medications: No outpatient medications have been marked as taking for the 10/06/19 encounter (Appointment) with O'Neal, Ronnald Ramp, MD.     Allergies:    Codeine   Social History: Social History   Socioeconomic History  . Marital status: Divorced    Spouse name: Not on file  . Number of children: 0  . Years of education: Not on file  . Highest education level: Not on file  Occupational History  . Not on file  Tobacco Use  . Smoking status: Former Smoker    Packs/day: 0.80    Years: 25.00    Pack years: 20.00    Types: Cigarettes    Quit date: 03/09/1990    Years since quitting: 29.5  . Smokeless tobacco: Never Used  . Tobacco comment: 1 cig every 2 weeks  Substance and Sexual Activity  . Alcohol use: No    Alcohol/week: 0.0 standard drinks  . Drug use: No  . Sexual activity: Not on file  Other Topics Concern  . Not on file  Social History Narrative   Divorced, HH 1   No children     States she's been to graduate school    Social Determinants of Health   Financial Resource Strain:   . Difficulty of Paying Living Expenses:   Food Insecurity:   . Worried About Programme researcher, broadcasting/film/video in the Last Year:   . Barista in the Last Year:   Transportation Needs:   . Freight forwarder (Medical):   Marland Kitchen Lack of Transportation (Non-Medical):   Physical Activity:   . Days of Exercise per Week:   . Minutes of Exercise per Session:   Stress:   . Feeling of Stress :   Social Connections:   . Frequency of Communication with Friends and Family:   . Frequency of Social Gatherings with Friends and Family:   . Attends Religious Services:   . Active Member of Clubs or Organizations:   . Attends Banker Meetings:   Marland Kitchen Marital Status:      Family History: The patient's ***family history is not on file.  ROS:   All other ROS reviewed and negative. Pertinent positives noted in the HPI.     EKGs/Labs/Other Studies Reviewed:   The following studies were  personally reviewed by me today:  EKG:  EKG is *** ordered today.  The ekg ordered today demonstrates ***, and was personally reviewed by me.   TTE 08/25/2017 - Left ventricle: The cavity size was normal. Wall thickness was  increased in a pattern of moderate LVH. Systolic function was  vigorous. The estimated ejection fraction was in the range of 65%  to 70%. Wall motion was normal; there were no regional wall  motion abnormalities. Doppler parameters are consistent with  abnormal left ventricular relaxation (grade 1 diastolic  dysfunction).   Recent Labs: 07/12/2019: ALT 16; BUN 12; Creatinine, Ser 0.67; Hemoglobin 13.0; Platelets 248.0; Potassium 4.0; Sodium 140; TSH 0.46   Recent Lipid Panel    Component Value Date/Time   CHOL 191 07/12/2019 1203   TRIG 240.0 (H) 07/12/2019 1203   HDL 39.50 07/12/2019 1203   CHOLHDL 5 07/12/2019 1203   VLDL 48.0 (H) 07/12/2019 1203   LDLDIRECT 116.0  07/12/2019 1203    Physical Exam:   VS:  There were no vitals taken for this visit.   Wt Readings from Last 3 Encounters:  09/06/19 218 lb (98.9 kg)  07/12/19 215 lb 12.8 oz (97.9 kg)  02/22/19 214 lb (97.1 kg)    General: Well nourished, well developed, in no acute distress Heart: Atraumatic, normal size  Eyes: PEERLA, EOMI  Neck: Supple, no JVD Endocrine: No thryomegaly Cardiac: Normal S1, S2; RRR; no murmurs, rubs, or gallops Lungs: Clear to auscultation bilaterally, no wheezing, rhonchi or rales  Abd: Soft, nontender, no hepatomegaly  Ext: No edema, pulses 2+ Musculoskeletal: No deformities, BUE and BLE strength normal and equal Skin: Warm and dry, no rashes   Neuro: Alert and oriented to person, place, time, and situation, CNII-XII grossly intact, no focal deficits  Psych: Normal mood and affect   ASSESSMENT:   Laurie Schaefer is a 64 y.o. female who presents for the following: No diagnosis found.  PLAN:   There are no diagnoses linked to this encounter.  Disposition: No follow-ups on file.  Medication Adjustments/Labs and Tests Ordered: Current medicines are reviewed at length with the patient today.  Concerns regarding medicines are outlined above.  No orders of the defined types were placed in this encounter.  No orders of the defined types were placed in this encounter.   There are no Patient Instructions on file for this visit.   Time Spent with Patient: I have spent a total of *** minutes with patient reviewing hospital notes, telemetry, EKGs, labs and examining the patient as well as establishing an assessment and plan that was discussed with the patient.  > 50% of time was spent in direct patient care.  Signed, Lenna Gilford. Flora Lipps, MD [facility]  [address], [address] [place], [place] [address] [phone]  10/04/2019 8:09 PM

## 2019-10-05 DIAGNOSIS — J9611 Chronic respiratory failure with hypoxia: Secondary | ICD-10-CM | POA: Diagnosis not present

## 2019-10-05 DIAGNOSIS — J301 Allergic rhinitis due to pollen: Secondary | ICD-10-CM | POA: Diagnosis not present

## 2019-10-05 DIAGNOSIS — F319 Bipolar disorder, unspecified: Secondary | ICD-10-CM | POA: Diagnosis not present

## 2019-10-05 DIAGNOSIS — J439 Emphysema, unspecified: Secondary | ICD-10-CM | POA: Diagnosis not present

## 2019-10-06 ENCOUNTER — Ambulatory Visit: Payer: Medicare HMO | Admitting: Cardiovascular Disease

## 2019-10-24 ENCOUNTER — Telehealth: Payer: Self-pay | Admitting: Acute Care

## 2019-10-24 MED ORDER — AZITHROMYCIN 250 MG PO TABS
ORAL_TABLET | ORAL | 0 refills | Status: DC
Start: 2019-10-24 — End: 2020-01-15

## 2019-10-24 MED ORDER — PREDNISONE 10 MG PO TABS: ORAL_TABLET | ORAL | 0 refills | Status: AC

## 2019-10-24 NOTE — Telephone Encounter (Signed)
This doesn't really sound infectious - suspect some VC and COPD flaring from the dust. Would prefer to treat with short course prednisone if she agrees. If she is Ok with this then give her pred Take 30mg  daily for 3 days, then 20mg  daily for 3 days, then 10mg  daily for 3 days, then stop. Also Ok to give ger z-pack, as may have an antiinflammatory effect

## 2019-10-24 NOTE — Telephone Encounter (Signed)
Called spoke with patient, she states she would like to take both medications.   Orders placed Verified pharmacy Nothing further needed at this time.

## 2019-10-24 NOTE — Telephone Encounter (Signed)
Called and spoke to pt. Pt states she has been cleaning out a basement for the last few days and is now c/o throat tickle, fatigue, sore throat, headache, body aches, chest tightness, non prod cough, SOB x 1 day. Pt states she has tried OTC cough syrup and her nebulizer with no relief. Offered pt an appt but she declined and states she cannot come in because she is in [place], [place]. Pt last seen by Kandice Robinsons, NP, in 08/2019. Pt last seen by Dr. Delton Coombes in 2018. SG unavailable to send message to.   Dr. Delton Coombes please advise. Thanks.

## 2019-11-02 ENCOUNTER — Telehealth: Payer: Self-pay | Admitting: Family Medicine

## 2019-11-02 NOTE — Progress Notes (Signed)
  Chronic Care Management   Outreach Note  11/02/2019 Name: Myrissa Chipley MRN: [id number] DOB: [birth year]  Referred by: Nelwyn Salisbury, MD Reason for referral : No chief complaint on file.   An unsuccessful telephone outreach was attempted today. The patient was referred to the pharmacist for assistance with care management and care coordination.   Follow Up Plan:   Carley Perdue [employer] Scheduler

## 2019-11-04 DIAGNOSIS — J9611 Chronic respiratory failure with hypoxia: Secondary | ICD-10-CM | POA: Diagnosis not present

## 2019-11-05 DIAGNOSIS — J9611 Chronic respiratory failure with hypoxia: Secondary | ICD-10-CM | POA: Diagnosis not present

## 2019-11-05 DIAGNOSIS — J301 Allergic rhinitis due to pollen: Secondary | ICD-10-CM | POA: Diagnosis not present

## 2019-11-05 DIAGNOSIS — J439 Emphysema, unspecified: Secondary | ICD-10-CM | POA: Diagnosis not present

## 2019-11-05 DIAGNOSIS — F319 Bipolar disorder, unspecified: Secondary | ICD-10-CM | POA: Diagnosis not present

## 2019-11-08 ENCOUNTER — Other Ambulatory Visit: Payer: Self-pay

## 2019-11-09 ENCOUNTER — Encounter: Payer: Self-pay | Admitting: Family Medicine

## 2019-11-09 ENCOUNTER — Telehealth (INDEPENDENT_AMBULATORY_CARE_PROVIDER_SITE_OTHER): Payer: Medicare HMO | Admitting: Family Medicine

## 2019-11-09 DIAGNOSIS — F1190 Opioid use, unspecified, uncomplicated: Secondary | ICD-10-CM

## 2019-11-09 DIAGNOSIS — M797 Fibromyalgia: Secondary | ICD-10-CM | POA: Diagnosis not present

## 2019-11-09 MED ORDER — OXYCODONE HCL 20 MG PO TABS: 20.0000 mg | ORAL_TABLET | Freq: Four times a day (QID) | ORAL | 0 refills | Status: DC | PRN

## 2019-11-09 MED ORDER — OXYCODONE HCL 20 MG PO TABS
20.0000 mg | ORAL_TABLET | Freq: Four times a day (QID) | ORAL | 0 refills | Status: DC | PRN
Start: 2019-12-09 — End: 2019-11-09

## 2019-11-09 NOTE — Progress Notes (Signed)
   Subjective:    Patient ID: Laurie Schaefer, female    DOB: Jan 05, 1956, 64 y.o.   MRN: [id number]  HPI Here for pain management. Her pain has not been well controlled on Percocet 10-325. She hurts almost every day, and this makes her want to isolate herself at home and not be around people.  Indication for chronic opioid: fibromyalgia Medication and dose: Percocet 10-325 # pills per month: 120 Last UDS date: 04-30-17 Opioid Treatment Agreement signed (Y/N): 04-30-17 Opioid Treatment Agreement last reviewed with patient:  11-09-19 [facility] reviewed this encounter (include red flags): Yes Virtual Visit via Telephone Note  I connected with the patient on 11/09/19 at 11:30 AM EDT by telephone and verified that I am speaking with the correct person using two identifiers.   I discussed the limitations, risks, security and privacy concerns of performing an evaluation and management service by telephone and the availability of in person appointments. I also discussed with the patient that there may be a patient responsible charge related to this service. The patient expressed understanding and agreed to proceed.  Location patient: home Location provider: work or home office Participants present for the call: patient, provider Patient did not have a visit in the prior 7 days to address this/these issue(s).   History of Present Illness:    Observations/Objective: Patient sounds cheerful and well on the phone. I do not appreciate any SOB. Speech and thought processing are grossly intact. Patient reported vitals:  Assessment and Plan:   Follow Up Instructions:     [id number] 5-10 [id number] 11-20 9443 21-30 I did not refer this patient for an OV in the next 24 hours for this/these issue(s).  I discussed the assessment and treatment plan with the patient. The patient was provided an opportunity to ask questions and all were answered. The patient agreed with the plan and demonstrated an  understanding of the instructions.   The patient was advised to call back or seek an in-person evaluation if the symptoms worsen or if the condition fails to improve as anticipated.  I provided 14 minutes of non-face-to-face time during this encounter.   Gershon Crane, MD    Review of Systems     Objective:   Physical Exam        Assessment & Plan:  Pain management. We will stop the Percocet and change to Oxycodone 20 mg to take as needed.  Gershon Crane, MD

## 2019-11-20 NOTE — Progress Notes (Deleted)
Cardiology Office Note:   Date:  11/20/2019  NAME:  Laurie Schaefer    MRN: [id number] DOB:  07-31-1955   PCP:  Nelwyn Salisbury, MD  Cardiologist:  No primary care provider on file.  Electrophysiologist:  None   Referring MD: Bevelyn Ngo, NP   No chief complaint on file. ***  History of Present Illness:   Laurie Schaefer is a 64 y.o. female with a hx of COPD, HLD who presents for the evaluation of tachycardia at the request of Bevelyn Ngo, NP. Tachycardia noted after inhaler use. Echo in 2019 normal. CT PE study in 2019 without coronary calcifications.   Problem List 1. COPD on 2L [place] 2. HLD -T chol 191, HDL 39, LDL 116, TG 240 -A1c 5.8   Past Medical History: Past Medical History:  Diagnosis Date  . Arthritis   . Bipolar disorder (HCC)   . Chronic neck pain    had previously seen [facility]. Salli Real at [facility]   . COPD (chronic obstructive pulmonary disease) (HCC)    sees Dr. Delton Coombes   . Depression   . Fibromyalgia   . Fibromyalgia   . Headache   . Hypertension     Past Surgical History: Past Surgical History:  Procedure Laterality Date  . FRACTURE SURGERY      Current Medications: No outpatient medications have been marked as taking for the 11/22/19 encounter (Appointment) with O'Neal, Ronnald Ramp, MD.     Allergies:    Codeine   Social History: Social History   Socioeconomic History  . Marital status: Divorced    Spouse name: Not on file  . Number of children: 0  . Years of education: Not on file  . Highest education level: Not on file  Occupational History  . Not on file  Tobacco Use  . Smoking status: Former Smoker    Packs/day: 0.80    Years: 25.00    Pack years: 20.00    Types: Cigarettes    Quit date: 03/09/1990    Years since quitting: 29.7  . Smokeless tobacco: Never Used  . Tobacco comment: 1 cig every 2 weeks  Substance and Sexual Activity  . Alcohol use: No    Alcohol/week: 0.0 standard drinks  . Drug use: No    . Sexual activity: Not on file  Other Topics Concern  . Not on file  Social History Narrative   Divorced, HH 1   No children   States she's been to graduate school    Social Determinants of Health   Financial Resource Strain:   . Difficulty of Paying Living Expenses: Not on file  Food Insecurity:   . Worried About Programme researcher, broadcasting/film/video in the Last Year: Not on file  . Ran Out of Food in the Last Year: Not on file  Transportation Needs:   . Lack of Transportation (Medical): Not on file  . Lack of Transportation (Non-Medical): Not on file  Physical Activity:   . Days of Exercise per Week: Not on file  . Minutes of Exercise per Session: Not on file  Stress:   . Feeling of Stress : Not on file  Social Connections:   . Frequency of Communication with Friends and Family: Not on file  . Frequency of Social Gatherings with Friends and Family: Not on file  . Attends Religious Services: Not on file  . Active Member of Clubs or Organizations: Not on file  . Attends Banker Meetings:  Not on file  . Marital Status: Not on file     Family History: The patient's ***family history is not on file.  ROS:   All other ROS reviewed and negative. Pertinent positives noted in the HPI.     EKGs/Labs/Other Studies Reviewed:   The following studies were personally reviewed by me today:  EKG:  EKG is *** ordered today.  The ekg ordered today demonstrates ***, and was personally reviewed by me.   Recent Labs: 07/12/2019: ALT 16; BUN 12; Creatinine, Ser 0.67; Hemoglobin 13.0; Platelets 248.0; Potassium 4.0; Sodium 140; TSH 0.46   Recent Lipid Panel    Component Value Date/Time   CHOL 191 07/12/2019 1203   TRIG 240.0 (H) 07/12/2019 1203   HDL 39.50 07/12/2019 1203   CHOLHDL 5 07/12/2019 1203   VLDL 48.0 (H) 07/12/2019 1203   LDLDIRECT 116.0 07/12/2019 1203    Physical Exam:   VS:  There were no vitals taken for this visit.   Wt Readings from Last 3 Encounters:  09/06/19 218  lb (98.9 kg)  07/12/19 215 lb 12.8 oz (97.9 kg)  02/22/19 214 lb (97.1 kg)    General: Well nourished, well developed, in no acute distress Heart: Atraumatic, normal size  Eyes: PEERLA, EOMI  Neck: Supple, no JVD Endocrine: No thryomegaly Cardiac: Normal S1, S2; RRR; no murmurs, rubs, or gallops Lungs: Clear to auscultation bilaterally, no wheezing, rhonchi or rales  Abd: Soft, nontender, no hepatomegaly  Ext: No edema, pulses 2+ Musculoskeletal: No deformities, BUE and BLE strength normal and equal Skin: Warm and dry, no rashes   Neuro: Alert and oriented to person, place, time, and situation, CNII-XII grossly intact, no focal deficits  Psych: Normal mood and affect   ASSESSMENT:   Laurie Schaefer is a 64 y.o. female who presents for the following: No diagnosis found.  PLAN:   There are no diagnoses linked to this encounter.  Disposition: No follow-ups on file.  Medication Adjustments/Labs and Tests Ordered: Current medicines are reviewed at length with the patient today.  Concerns regarding medicines are outlined above.  No orders of the defined types were placed in this encounter.  No orders of the defined types were placed in this encounter.   There are no Patient Instructions on file for this visit.   Time Spent with Patient: I have spent a total of *** minutes with patient reviewing hospital notes, telemetry, EKGs, labs and examining the patient as well as establishing an assessment and plan that was discussed with the patient.  > 50% of time was spent in direct patient care.  Signed, Lenna Gilford. Flora Lipps, MD [facility]  [address], [address] [place], [place] [address] [phone]  11/20/2019 7:34 PM

## 2019-11-22 ENCOUNTER — Ambulatory Visit: Payer: Medicare HMO | Admitting: Cardiovascular Disease

## 2019-11-28 ENCOUNTER — Telehealth: Payer: Self-pay | Admitting: Family Medicine

## 2019-11-28 ENCOUNTER — Ambulatory Visit: Payer: Medicare HMO | Admitting: Cardiovascular Disease

## 2019-11-28 NOTE — Progress Notes (Signed)
  Chronic Care Management   Outreach Note  11/28/2019 Name: Laurie Schaefer MRN: [id number] DOB: 12-24-1955  Referred by: Nelwyn Salisbury, MD Reason for referral : No chief complaint on file.   Third unsuccessful telephone outreach was attempted today. The patient was referred to the pharmacist for assistance with care management and care coordination.   Follow Up Plan:   Carley Perdue [employer] Scheduler

## 2019-12-05 DIAGNOSIS — J9611 Chronic respiratory failure with hypoxia: Secondary | ICD-10-CM | POA: Diagnosis not present

## 2019-12-06 DIAGNOSIS — J439 Emphysema, unspecified: Secondary | ICD-10-CM | POA: Diagnosis not present

## 2019-12-06 DIAGNOSIS — J9611 Chronic respiratory failure with hypoxia: Secondary | ICD-10-CM | POA: Diagnosis not present

## 2019-12-06 DIAGNOSIS — J301 Allergic rhinitis due to pollen: Secondary | ICD-10-CM | POA: Diagnosis not present

## 2019-12-06 DIAGNOSIS — F319 Bipolar disorder, unspecified: Secondary | ICD-10-CM | POA: Diagnosis not present

## 2019-12-19 ENCOUNTER — Ambulatory Visit: Payer: Medicare HMO | Attending: Internal Medicine

## 2019-12-19 ENCOUNTER — Ambulatory Visit: Payer: Medicare HMO | Admitting: Internal Medicine

## 2019-12-19 DIAGNOSIS — Z23 Encounter for immunization: Secondary | ICD-10-CM

## 2019-12-19 NOTE — Progress Notes (Signed)
   Covid-19 Vaccination Clinic  Name:  Laurie Schaefer    MRN: [id number] DOB: Aug 03, 1955  12/19/2019  Laurie Schaefer was observed post Covid-19 immunization for 15 minutes without incident. She was provided with Vaccine Information Sheet and instruction to access the V-Safe system.   Laurie Schaefer was instructed to call 911 with any severe reactions post vaccine: Marland Kitchen Difficulty breathing  . Swelling of face and throat  . A fast heartbeat  . A bad rash all over body  . Dizziness and weakness

## 2019-12-22 ENCOUNTER — Telehealth: Payer: Self-pay

## 2019-12-22 ENCOUNTER — Telehealth: Payer: Self-pay | Admitting: Family Medicine

## 2019-12-22 NOTE — Telephone Encounter (Signed)
Patient called, left VM to return the call to the office.  Patient sent a letter to Dr. Clent Ridges and he says she will need an appointment to discuss.

## 2019-12-22 NOTE — Telephone Encounter (Signed)
Done

## 2019-12-27 ENCOUNTER — Telehealth: Payer: Self-pay | Admitting: Pharmacist

## 2019-12-27 NOTE — Telephone Encounter (Signed)
    Laurie Schaefer, [facility]     12/27/19 4:52 PM Note   Patient called as she reports she needs something stronger for her pain management. She took the oxycodone 20 mg and this did not help enough and has not picked up the latest prescription from the pharmacy. She is currently only taking ibuprofen 800 mg for pain. Patient reports that she was told by Dr. Clent Ridges to call if she needs something stronger, they had discussed a higher dose of oxycodone or switching to Dilaudid.   Please call the patient back if you need more information or if she needs to make an appointment with Dr. Clent Ridges.

## 2019-12-27 NOTE — Progress Notes (Signed)
Patient called as she reports she needs something stronger for her pain management. She took the oxycodone 20 mg and this did not help enough and has not picked up the latest prescription from the pharmacy. She is currently only taking ibuprofen 800 mg for pain. Patient reports that she was told by Dr. Clent Ridges to call if she needs something stronger, they had discussed a higher dose of oxycodone or switching to Dilaudid.   Please call the patient back if you need more information or if she needs to make an appointment with Dr. Clent Ridges.

## 2019-12-28 ENCOUNTER — Encounter: Payer: Self-pay | Admitting: Family Medicine

## 2019-12-28 MED ORDER — OXYCODONE HCL 30 MG PO TABS
30.0000 mg | ORAL_TABLET | ORAL | 0 refills | Status: AC | PRN
Start: 2019-12-28 — End: 2020-01-02

## 2019-12-28 NOTE — Telephone Encounter (Signed)
I sent in a small supply for her to try the Oxycodone 30 mg for a few days

## 2019-12-28 NOTE — Telephone Encounter (Signed)
Patient called and advised of Dr. Clent Ridges recommendation to schedule OV to discuss pain management and also notified he sent in Oxy 30 mg to take x 5 days. 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 I advised a virtual visit on Tuesday, she agreed. Appointment scheduled for Tuesday, 01/02/20 at 1100.

## 2019-12-28 NOTE — Telephone Encounter (Signed)
Set up another OV so we can talk about this

## 2020-01-02 ENCOUNTER — Telehealth: Payer: Medicare HMO | Admitting: Family Medicine

## 2020-01-04 DIAGNOSIS — J9611 Chronic respiratory failure with hypoxia: Secondary | ICD-10-CM | POA: Diagnosis not present

## 2020-01-05 DIAGNOSIS — J439 Emphysema, unspecified: Secondary | ICD-10-CM | POA: Diagnosis not present

## 2020-01-05 DIAGNOSIS — F319 Bipolar disorder, unspecified: Secondary | ICD-10-CM | POA: Diagnosis not present

## 2020-01-05 DIAGNOSIS — J9611 Chronic respiratory failure with hypoxia: Secondary | ICD-10-CM | POA: Diagnosis not present

## 2020-01-05 DIAGNOSIS — J301 Allergic rhinitis due to pollen: Secondary | ICD-10-CM | POA: Diagnosis not present

## 2020-01-08 ENCOUNTER — Ambulatory Visit: Payer: Medicare HMO | Admitting: Cardiovascular Disease

## 2020-01-09 NOTE — Progress Notes (Deleted)
Cardiology Office Note:   Date:  01/09/2020  NAME:  Laurie Schaefer    MRN: [id number] DOB:  [birth year]   PCP:  Nelwyn Salisbury, MD  Cardiologist:  No primary care provider on file.  Electrophysiologist:  None   Referring MD: Nelwyn Salisbury, MD   No chief complaint on file. ***  History of Present Illness:   Laurie Schaefer is a 64 y.o. female with a hx of COPD, HLD, chronic pain who is being seen today for the evaluation of tachycardia at the request of Nelwyn Salisbury, MD. Had issues with tachycardia after using inhalers. Recent CT PE study in 2019 with 0 CAC.   Problem List 1. COPD -nocturnal O2 2. Chronic pain/fibromyalgia  3. HLD -T chol 191, HDL 39, LDL 116, TG 240  Past Medical History: Past Medical History:  Diagnosis Date  . Arthritis   . Bipolar disorder (HCC)   . Chronic neck pain    had previously seen Dr. Salli Real at [facility]   . COPD (chronic obstructive pulmonary disease) (HCC)    sees Dr. Delton Coombes   . Depression   . Fibromyalgia   . Fibromyalgia   . Headache   . Hypertension     Past Surgical History: Past Surgical History:  Procedure Laterality Date  . FRACTURE SURGERY      Current Medications: No outpatient medications have been marked as taking for the 01/10/20 encounter (Appointment) with O'Neal, Ronnald Ramp, MD.     Allergies:    Codeine   Social History: Social History   Socioeconomic History  . Marital status: Divorced    Spouse name: Not on file  . Number of children: 0  . Years of education: Not on file  . Highest education level: Not on file  Occupational History  . Not on file  Tobacco Use  . Smoking status: Former Smoker    Packs/day: 0.80    Years: 25.00    Pack years: 20.00    Types: Cigarettes    Quit date: 03/09/1990    Years since quitting: 29.8  . Smokeless tobacco: Never Used  . Tobacco comment: 1 cig every 2 weeks  Substance and Sexual Activity  . Alcohol use: No    Alcohol/week: 0.0  standard drinks  . Drug use: No  . Sexual activity: Not on file  Other Topics Concern  . Not on file  Social History Narrative   Divorced, HH 1   No children   States she's been to graduate school    Social Determinants of Health   Financial Resource Strain:   . Difficulty of Paying Living Expenses: Not on file  Food Insecurity:   . Worried About Programme researcher, broadcasting/film/video in the Last Year: Not on file  . Ran Out of Food in the Last Year: Not on file  Transportation Needs:   . Lack of Transportation (Medical): Not on file  . Lack of Transportation (Non-Medical): Not on file  Physical Activity:   . Days of Exercise per Week: Not on file  . Minutes of Exercise per Session: Not on file  Stress:   . Feeling of Stress : Not on file  Social Connections:   . Frequency of Communication with Friends and Family: Not on file  . Frequency of Social Gatherings with Friends and Family: Not on file  . Attends Religious Services: Not on file  . Active Member of Clubs or Organizations: Not on file  . Attends Club  or Organization Meetings: Not on file  . Marital Status: Not on file     Family History: The patient's ***family history is not on file.  ROS:   All other ROS reviewed and negative. Pertinent positives noted in the HPI.     EKGs/Labs/Other Studies Reviewed:   The following studies were personally reviewed by me today:  EKG:  EKG is *** ordered today.  The ekg ordered today demonstrates ***, and was personally reviewed by me.   Recent Labs: 07/12/2019: ALT 16; BUN 12; Creatinine, Ser 0.67; Hemoglobin 13.0; Platelets 248.0; Potassium 4.0; Sodium 140; TSH 0.46   Recent Lipid Panel    Component Value Date/Time   CHOL 191 07/12/2019 1203   TRIG 240.0 (H) 07/12/2019 1203   HDL 39.50 07/12/2019 1203   CHOLHDL 5 07/12/2019 1203   VLDL 48.0 (H) 07/12/2019 1203   LDLDIRECT 116.0 07/12/2019 1203    Physical Exam:   VS:  There were no vitals taken for this visit.   Wt Readings from  Last 3 Encounters:  09/06/19 218 lb (98.9 kg)  07/12/19 215 lb 12.8 oz (97.9 kg)  02/22/19 214 lb (97.1 kg)    General: Well nourished, well developed, in no acute distress Heart: Atraumatic, normal size  Eyes: PEERLA, EOMI  Neck: Supple, no JVD Endocrine: No thryomegaly Cardiac: Normal S1, S2; RRR; no murmurs, rubs, or gallops Lungs: Clear to auscultation bilaterally, no wheezing, rhonchi or rales  Abd: Soft, nontender, no hepatomegaly  Ext: No edema, pulses 2+ Musculoskeletal: No deformities, BUE and BLE strength normal and equal Skin: Warm and dry, no rashes   Neuro: Alert and oriented to person, place, time, and situation, CNII-XII grossly intact, no focal deficits  Psych: Normal mood and affect   ASSESSMENT:   Laurie Schaefer is a 64 y.o. female who presents for the following: No diagnosis found.  PLAN:   There are no diagnoses linked to this encounter.  Disposition: No follow-ups on file.  Medication Adjustments/Labs and Tests Ordered: Current medicines are reviewed at length with the patient today.  Concerns regarding medicines are outlined above.  No orders of the defined types were placed in this encounter.  No orders of the defined types were placed in this encounter.   There are no Patient Instructions on file for this visit.   Time Spent with Patient: I have spent a total of *** minutes with patient reviewing hospital notes, telemetry, EKGs, labs and examining the patient as well as establishing an assessment and plan that was discussed with the patient.  > 50% of time was spent in direct patient care.  Signed, Lenna Gilford. Flora Lipps, MD [facility]  [address], [address] [place], [place] [address] [phone]  01/09/2020 7:59 PM

## 2020-01-10 ENCOUNTER — Ambulatory Visit: Payer: Medicare HMO | Admitting: Cardiovascular Disease

## 2020-01-10 DIAGNOSIS — R000 Tachycardia, unspecified: Secondary | ICD-10-CM

## 2020-01-10 DIAGNOSIS — E782 Mixed hyperlipidemia: Secondary | ICD-10-CM

## 2020-01-14 ENCOUNTER — Other Ambulatory Visit: Payer: Self-pay | Admitting: Family Medicine

## 2020-01-15 ENCOUNTER — Encounter (HOSPITAL_COMMUNITY): Payer: Self-pay

## 2020-01-15 ENCOUNTER — Other Ambulatory Visit: Payer: Self-pay

## 2020-01-15 ENCOUNTER — Emergency Department (HOSPITAL_COMMUNITY): Payer: Medicare HMO

## 2020-01-15 ENCOUNTER — Observation Stay (HOSPITAL_COMMUNITY)
Admission: EM | Admit: 2020-01-15 | Discharge: 2020-01-16 | Disposition: A | Discharge status: Home / Self Care | Payer: Medicare HMO | Attending: Internal Medicine | Admitting: Internal Medicine

## 2020-01-15 ENCOUNTER — Telehealth: Payer: Self-pay | Admitting: Family Medicine

## 2020-01-15 DIAGNOSIS — M25551 Pain in right hip: Secondary | ICD-10-CM | POA: Diagnosis not present

## 2020-01-15 DIAGNOSIS — R52 Pain, unspecified: Secondary | ICD-10-CM

## 2020-01-15 DIAGNOSIS — F319 Bipolar disorder, unspecified: Secondary | ICD-10-CM | POA: Diagnosis present

## 2020-01-15 DIAGNOSIS — Z87891 Personal history of nicotine dependence: Secondary | ICD-10-CM | POA: Insufficient documentation

## 2020-01-15 DIAGNOSIS — N951 Menopausal and female climacteric states: Secondary | ICD-10-CM | POA: Diagnosis present

## 2020-01-15 DIAGNOSIS — M25552 Pain in left hip: Secondary | ICD-10-CM | POA: Diagnosis not present

## 2020-01-15 DIAGNOSIS — Z20822 Contact with and (suspected) exposure to covid-19: Secondary | ICD-10-CM | POA: Diagnosis not present

## 2020-01-15 DIAGNOSIS — M79605 Pain in left leg: Secondary | ICD-10-CM | POA: Diagnosis not present

## 2020-01-15 DIAGNOSIS — J449 Chronic obstructive pulmonary disease, unspecified: Secondary | ICD-10-CM | POA: Diagnosis present

## 2020-01-15 DIAGNOSIS — I10 Essential (primary) hypertension: Secondary | ICD-10-CM | POA: Diagnosis present

## 2020-01-15 DIAGNOSIS — I2609 Other pulmonary embolism with acute cor pulmonale: Secondary | ICD-10-CM

## 2020-01-15 DIAGNOSIS — M7912 Myalgia of auxiliary muscles, head and neck: Secondary | ICD-10-CM | POA: Diagnosis present

## 2020-01-15 DIAGNOSIS — I2699 Other pulmonary embolism without acute cor pulmonale: Secondary | ICD-10-CM | POA: Diagnosis not present

## 2020-01-15 DIAGNOSIS — R519 Headache, unspecified: Secondary | ICD-10-CM | POA: Diagnosis not present

## 2020-01-15 DIAGNOSIS — Z79899 Other long term (current) drug therapy: Secondary | ICD-10-CM | POA: Insufficient documentation

## 2020-01-15 DIAGNOSIS — K760 Fatty (change of) liver, not elsewhere classified: Secondary | ICD-10-CM | POA: Diagnosis not present

## 2020-01-15 DIAGNOSIS — R1032 Left lower quadrant pain: Secondary | ICD-10-CM | POA: Insufficient documentation

## 2020-01-15 LAB — BASIC METABOLIC PANEL
Anion gap: 9 (ref 5–15)
BUN: 8 mg/dL (ref 8–23)
CO2: 26 mmol/L (ref 22–32)
Calcium: 8.8 mg/dL — ABNORMAL LOW (ref 8.9–10.3)
Chloride: 102 mmol/L (ref 98–111)
Creatinine, Ser: 0.76 mg/dL (ref 0.44–1.00)
GFR, Estimated: >60 mL/min (ref >60)
Glucose, Bld: 119 mg/dL — ABNORMAL HIGH (ref 70–99)
Potassium: 3.7 mmol/L (ref 3.5–5.1)
Sodium: 137 mmol/L (ref 135–145)

## 2020-01-15 LAB — RAPID URINE DRUG SCREEN, HOSP PERFORMED
Amphetamines: NOT DETECTED
Barbiturates: NOT DETECTED
Benzodiazepines: POSITIVE — AB
Cocaine: NOT DETECTED
Opiates: POSITIVE — AB
Tetrahydrocannabinol: NOT DETECTED

## 2020-01-15 LAB — RESPIRATORY PANEL BY RT PCR (FLU A&B, COVID)
Influenza A by PCR: NEGATIVE
Influenza B by PCR: NEGATIVE
SARS Coronavirus 2 by RT PCR: NEGATIVE

## 2020-01-15 LAB — CBG MONITORING, ED: Glucose-Capillary: 108 mg/dL — ABNORMAL HIGH (ref 70–99)

## 2020-01-15 LAB — URINALYSIS, ROUTINE W REFLEX MICROSCOPIC
Bilirubin Urine: NEGATIVE
Glucose, UA: NEGATIVE mg/dL
Hgb urine dipstick: NEGATIVE
Ketones, ur: NEGATIVE mg/dL
Leukocytes,Ua: NEGATIVE
Nitrite: NEGATIVE
Protein, ur: NEGATIVE mg/dL
Specific Gravity, Urine: 1.015 (ref 1.005–1.030)
pH: 5 (ref 5.0–8.0)

## 2020-01-15 LAB — CBC
HCT: 41.9 % (ref 36.0–46.0)
Hemoglobin: 14 g/dL (ref 12.0–15.0)
MCH: 29.5 pg (ref 26.0–34.0)
MCHC: 33.4 g/dL (ref 30.0–36.0)
MCV: 88.2 fL (ref 80.0–100.0)
Platelets: 265 10*3/uL (ref 150–400)
RBC: 4.75 MIL/uL (ref 3.87–5.11)
RDW: 13.2 % (ref 11.5–15.5)
WBC: 10.7 10*3/uL — ABNORMAL HIGH (ref 4.0–10.5)
nRBC: 0 % (ref 0.0–0.2)

## 2020-01-15 LAB — PROTIME-INR
INR: 1.1 (ref 0.8–1.2)
Prothrombin Time: 13.8 seconds (ref 11.4–15.2)

## 2020-01-15 LAB — TROPONIN I (HIGH SENSITIVITY)
Troponin I (High Sensitivity): 3 ng/L (ref <18)
Troponin I (High Sensitivity): 4 ng/L (ref <18)

## 2020-01-15 LAB — D-DIMER, QUANTITATIVE: D-Dimer, Quant: 1.27 ug/mL-FEU — ABNORMAL HIGH (ref 0.00–0.50)

## 2020-01-15 MED ORDER — IOHEXOL 350 MG/ML SOLN
100.0000 mL | Freq: Once | INTRAVENOUS | Status: AC | PRN
  Administered 2020-01-15: 100 mL via INTRAVENOUS

## 2020-01-15 MED ORDER — ACETAMINOPHEN 325 MG PO TABS: 650.0000 mg | ORAL_TABLET | Freq: Four times a day (QID) | ORAL | Status: DC | PRN

## 2020-01-15 MED ORDER — KETOROLAC TROMETHAMINE 15 MG/ML IJ SOLN
15.0000 mg | Freq: Once | INTRAMUSCULAR | Status: AC
  Administered 2020-01-15: 15 mg via INTRAVENOUS
  Filled 2020-01-15: qty 1

## 2020-01-15 MED ORDER — APIXABAN 5 MG PO TABS: 5.0000 mg | ORAL_TABLET | Freq: Two times a day (BID) | ORAL | Status: DC

## 2020-01-15 MED ORDER — ONDANSETRON HCL 4 MG/2ML IJ SOLN: 4.0000 mg | Freq: Four times a day (QID) | INTRAMUSCULAR | Status: DC | PRN

## 2020-01-15 MED ORDER — HYDRALAZINE HCL 20 MG/ML IJ SOLN: 5.0000 mg | INTRAMUSCULAR | Status: DC | PRN

## 2020-01-15 MED ORDER — UMECLIDINIUM BROMIDE 62.5 MCG/INH IN AEPB
1.0000 | INHALATION_SPRAY | Freq: Every day | RESPIRATORY_TRACT | Status: DC
  Filled 2020-01-15: qty 7

## 2020-01-15 MED ORDER — SODIUM CHLORIDE 0.9 % IV BOLUS
1000.0000 mL | Freq: Once | INTRAVENOUS | Status: AC
  Administered 2020-01-15: 1000 mL via INTRAVENOUS

## 2020-01-15 MED ORDER — QUETIAPINE FUMARATE 50 MG PO TABS
50.0000 mg | ORAL_TABLET | Freq: Two times a day (BID) | ORAL | Status: DC
  Administered 2020-01-15 – 2020-01-16 (×3): 50 mg via ORAL
  Filled 2020-01-15 (×5): qty 1

## 2020-01-15 MED ORDER — ACETAMINOPHEN 325 MG PO TABS
650.0000 mg | ORAL_TABLET | Freq: Once | ORAL | Status: AC
  Administered 2020-01-15: 650 mg via ORAL
  Filled 2020-01-15: qty 2

## 2020-01-15 MED ORDER — ALBUTEROL SULFATE (2.5 MG/3ML) 0.083% IN NEBU: 2.5000 mg | INHALATION_SOLUTION | RESPIRATORY_TRACT | Status: DC | PRN

## 2020-01-15 MED ORDER — HYDROCHLOROTHIAZIDE 25 MG PO TABS
25.0000 mg | ORAL_TABLET | Freq: Every day | ORAL | Status: DC
  Administered 2020-01-15 – 2020-01-16 (×2): 25 mg via ORAL
  Filled 2020-01-15 (×2): qty 1

## 2020-01-15 MED ORDER — APIXABAN 5 MG PO TABS
10.0000 mg | ORAL_TABLET | Freq: Two times a day (BID) | ORAL | Status: DC
  Administered 2020-01-15 – 2020-01-16 (×3): 10 mg via ORAL
  Filled 2020-01-15 (×3): qty 2

## 2020-01-15 MED ORDER — ALBUTEROL SULFATE HFA 108 (90 BASE) MCG/ACT IN AERS: 2.0000 | INHALATION_SPRAY | Freq: Four times a day (QID) | RESPIRATORY_TRACT | Status: DC | PRN

## 2020-01-15 MED ORDER — LOSARTAN POTASSIUM-HCTZ 100-25 MG PO TABS: 1.0000 | ORAL_TABLET | Freq: Every day | ORAL | Status: DC

## 2020-01-15 MED ORDER — LORAZEPAM 1 MG PO TABS
2.0000 mg | ORAL_TABLET | Freq: Three times a day (TID) | ORAL | Status: DC | PRN
  Administered 2020-01-15 – 2020-01-16 (×2): 2 mg via ORAL
  Filled 2020-01-15 (×3): qty 2

## 2020-01-15 MED ORDER — ACETAMINOPHEN 650 MG RE SUPP: 650.0000 mg | Freq: Four times a day (QID) | RECTAL | Status: DC | PRN

## 2020-01-15 MED ORDER — DOCUSATE SODIUM 100 MG PO CAPS
100.0000 mg | ORAL_CAPSULE | Freq: Two times a day (BID) | ORAL | Status: DC
  Administered 2020-01-15 – 2020-01-16 (×3): 100 mg via ORAL
  Filled 2020-01-15 (×3): qty 1

## 2020-01-15 MED ORDER — PANTOPRAZOLE SODIUM 40 MG PO TBEC
40.0000 mg | DELAYED_RELEASE_TABLET | Freq: Every day | ORAL | Status: DC
  Administered 2020-01-15 – 2020-01-16 (×2): 40 mg via ORAL
  Filled 2020-01-15 (×2): qty 1

## 2020-01-15 MED ORDER — TRAZODONE HCL 100 MG PO TABS
100.0000 mg | ORAL_TABLET | Freq: Every evening | ORAL | Status: DC | PRN
  Administered 2020-01-15: 100 mg via ORAL
  Filled 2020-01-15 (×2): qty 2

## 2020-01-15 MED ORDER — AMLODIPINE BESYLATE 5 MG PO TABS
5.0000 mg | ORAL_TABLET | Freq: Every day | ORAL | Status: DC
  Administered 2020-01-15 – 2020-01-16 (×2): 5 mg via ORAL
  Filled 2020-01-15 (×2): qty 1

## 2020-01-15 MED ORDER — LACTATED RINGERS IV SOLN: INTRAVENOUS | Status: DC

## 2020-01-15 MED ORDER — LOSARTAN POTASSIUM 50 MG PO TABS
100.0000 mg | ORAL_TABLET | Freq: Every day | ORAL | Status: DC
  Administered 2020-01-15 – 2020-01-16 (×2): 100 mg via ORAL
  Filled 2020-01-15 (×2): qty 2

## 2020-01-15 MED ORDER — BISACODYL 5 MG PO TBEC: 5.0000 mg | DELAYED_RELEASE_TABLET | Freq: Every day | ORAL | Status: DC | PRN

## 2020-01-15 MED ORDER — SODIUM CHLORIDE 0.9% FLUSH
3.0000 mL | Freq: Two times a day (BID) | INTRAVENOUS | Status: DC
  Administered 2020-01-15 – 2020-01-16 (×2): 3 mL via INTRAVENOUS

## 2020-01-15 MED ORDER — APIXABAN 5 MG PO TABS: 10.0000 mg | ORAL_TABLET | Freq: Two times a day (BID) | ORAL | 0 refills | Status: DC

## 2020-01-15 MED ORDER — ONDANSETRON HCL 4 MG PO TABS: 4.0000 mg | ORAL_TABLET | Freq: Four times a day (QID) | ORAL | Status: DC | PRN

## 2020-01-15 MED ORDER — FLUTICASONE FUROATE-VILANTEROL 200-25 MCG/INH IN AEPB
1.0000 | INHALATION_SPRAY | Freq: Every day | RESPIRATORY_TRACT | Status: DC
  Filled 2020-01-15: qty 28

## 2020-01-15 MED ORDER — MEDROXYPROGESTERONE ACETATE 2.5 MG PO TABS
2.5000 mg | ORAL_TABLET | Freq: Every day | ORAL | Status: DC
  Administered 2020-01-15 – 2020-01-16 (×2): 2.5 mg via ORAL
  Filled 2020-01-15 (×2): qty 1

## 2020-01-15 MED ORDER — POLYETHYLENE GLYCOL 3350 17 G PO PACK: 17.0000 g | PACK | Freq: Every day | ORAL | Status: DC | PRN

## 2020-01-15 NOTE — ED Notes (Signed)
Pt is calmer at this time.  Pt ambulated with friend around nurses station without incident.  Pt stated that she's gonna stay now cause it's too late to leave.

## 2020-01-15 NOTE — Consult Note (Deleted)
ER Consult Only - Patient refused to stay in the hospital overnight and left AMA   Laurie Schaefer NWG:[id number] DOB: 1955/05/24 DOA: 01/15/2020  PCP: Nelwyn Salisbury, MD Consultants:  Delton Coombes - pulmonology Patient coming from:  Home - lives with significant other; NOK: Significant other, Laurie Schaefer, [phone]  Chief Complaint: Generalized pain  HPI: Laurie Schaefer is a 64 y.o. female with medical history significant of HTN; bipolar; fibromyalgia; and COPD presenting with generalized pain.  She reports that she has been having pain in her chest and generalized body for about 1 week.  Also with SOB (has COPD and wears nightly O2), worse than baseline.  Last night, she felt "paralyzed in my hips" and was unable to get out of bed.  Symptoms lasted for 10-12 minutes.  She also had sharp, dagger-like pains in her legs.  The weakness has resolved but she continues to feel generalized pain.  Right now she feels "awful" due to the diffuse pain, and also feels "dehydrated" and hungry.  She has some apparent cognitive impairment, with cognitive slowing at times and random questions/behavior ("Are you the hospitalist?" asked at a random time during the evaluation followed by cackling).  No prior h/o VTE.    ED Course:  Multiple complaints.  Found to have RUL PE.  Chronic pain, generalized pain.  Episodic weakness - couldn't walk last night.  Review of Systems: As per HPI; otherwise review of systems reviewed and negative.    COVID Vaccine Status:   Complete plus booster  Past Medical History:  Diagnosis Date  . Arthritis   . Bipolar disorder (HCC)   . Chronic neck pain    had previously seen Dr. Salli Real at [facility]   . COPD (chronic obstructive pulmonary disease) (HCC)    sees Dr. Delton Coombes   . Depression   . Fibromyalgia   . Headache   . Hypertension     Past Surgical History:  Procedure Laterality Date  . FRACTURE SURGERY      Social History   Socioeconomic  History  . Marital status: Divorced    Spouse name: Not on file  . Number of children: 0  . Years of education: Not on file  . Highest education level: Not on file  Occupational History  . Not on file  Tobacco Use  . Smoking status: Former Smoker    Packs/day: 0.80    Years: 25.00    Pack years: 20.00    Types: Cigarettes    Quit date: 03/09/1990    Years since quitting: 29.8  . Smokeless tobacco: Never Used  . Tobacco comment: 1 cig every 2 weeks  Substance and Sexual Activity  . Alcohol use: No    Alcohol/week: 0.0 standard drinks  . Drug use: No  . Sexual activity: Not on file  Other Topics Concern  . Not on file  Social History Narrative   Divorced, HH 1   No children   States she's been to graduate school    Social Determinants of Health   Financial Resource Strain:   . Difficulty of Paying Living Expenses: Not on file  Food Insecurity:   . Worried About Programme researcher, broadcasting/film/video in the Last Year: Not on file  . Ran Out of Food in the Last Year: Not on file  Transportation Needs:   . Lack of Transportation (Medical): Not on file  . Lack of Transportation (Non-Medical): Not on file  Physical Activity:   . Days of  Exercise per Week: Not on file  . Minutes of Exercise per Session: Not on file  Stress:   . Feeling of Stress : Not on file  Social Connections:   . Frequency of Communication with Friends and Family: Not on file  . Frequency of Social Gatherings with Friends and Family: Not on file  . Attends Religious Services: Not on file  . Active Member of Clubs or Organizations: Not on file  . Attends Banker Meetings: Not on file  . Marital Status: Not on file  Intimate Partner Violence:   . Fear of Current or Ex-Partner: Not on file  . Emotionally Abused: Not on file  . Physically Abused: Not on file  . Sexually Abused: Not on file    Allergies  Allergen Reactions  . Codeine Nausea Only    History reviewed. No pertinent family history.  Prior  to Admission medications   Medication Sig Start Date End Date Taking? Authorizing Provider  albuterol (VENTOLIN HFA) 108 (90 Base) MCG/ACT inhaler INHALE 2 PUFFS INTO THE LUNGS EVERY 6 HOURS AS NEEDED FOR WHEEZING OR SHORTNESS OF BREATH 05/30/19   Parrett, Tammy S, NP  amLODipine (NORVASC) 5 MG tablet Take 1 tablet (5 mg total) by mouth daily. 02/13/19   Nelwyn Salisbury, MD  azithromycin (ZITHROMAX) 250 MG tablet Take 2 tablets (500mg  total) today,then 1 tablet (250mg ) x4 days 10/24/19   , MD  budesonide-formoterol [facility]) 160-4.5 MCG/ACT inhaler Inhale 2 puffs into the lungs 2 (two) times daily. Patient not taking: Reported on 09/06/2019 05/30/19   Parrett, 09/08/2019, NP  cholecalciferol (VITAMIN D) 1000 units tablet Take 1,000 Units by mouth daily.    [provider]  estradiol (ESTRACE) 1 MG tablet TAKE 1 TABLET EVERY DAY 04/07/19   Virgel Bouquet, MD  Fexofenadine HCl [facility] ALLERGY PO) Take 1 tablet by mouth daily as needed (allergies/congestion.).     [provider]  FLUAD QUADRIVALENT 0.5 ML injection  06/06/19   [provider]  fluticasone (FLONASE) 50 MCG/ACT nasal spray Place 2 sprays into both nostrils daily. 03/13/16   06/08/19, MD  ibuprofen (ADVIL) 800 MG tablet Take 1 tablet (800 mg total) by mouth every 6 (six) hours as needed. for pain 02/13/19   Leslye Peer, MD  levalbuterol 14/7/20) 0.31 MG/3ML nebulizer solution Take 3 mLs (0.31 mg total) by nebulization every 4 (four) hours as needed for wheezing. 09/06/19   Pauline Aus, NP  levalbuterol (XOPENEX) 0.63 MG/3ML nebulizer solution INHALE 09/08/19 BY MOUTH VIA NEBULIZER EVERY 4 HOURS AS NEEDED FOR WHEEZE OR SHORTNESS OF BREATH 06/12/19   Parrett, , NP  linaclotide (LINZESS) 145 MCG CAPS capsule Take 1 capsule (145 mcg total) by mouth daily before breakfast. 04/11/19   Virgel Bouquet, MD  LORazepam (ATIVAN) 2 MG tablet TAKE 1 TABLET(2 MG) BY MOUTH EVERY 8 HOURS AS NEEDED FOR ANXIETY  07/12/19   Nelwyn Salisbury, MD  losartan-hydrochlorothiazide (HYZAAR) 100-25 MG tablet Take 1 tablet by mouth daily. 02/13/19   Nelwyn Salisbury, MD  medroxyPROGESTERone (PROVERA) 2.5 MG tablet Take 1 tablet (2.5 mg total) by mouth daily. 06/14/18   Nelwyn Salisbury, MD  OXYGEN 3lpm with sleep and exertion if needed    [provider]  pantoprazole (PROTONIX) 40 MG tablet Take 1 tablet (40 mg total) by mouth daily. 07/12/19   Nelwyn Salisbury, MD  PREVNAR 13 SUSP injection  06/06/19   [provider]  QUEtiapine (SEROQUEL) 50 MG tablet Take 1 tablet (50 mg total) by mouth 2 (two) times daily. 07/12/19   Nelwyn Salisbury, MD  Tiotropium Bromide Monohydrate (SPIRIVA RESPIMAT) 2.5 MCG/ACT AERS Inhale 2 puffs into the lungs daily. Patient not taking: Reported on 09/06/2019 05/30/19   Parrett, Virgel Bouquet, NP  traZODone (DESYREL) 100 MG tablet Take 1 tablet (100 mg total) by mouth at bedtime. 02/24/19   Nelwyn Salisbury, MD  zolpidem (AMBIEN) 10 MG tablet TAKE 1 TABLET BY MOUTH EVERY NIGHT AT BEDTIME AS NEEDED SLEEP 07/12/19   Nelwyn Salisbury, MD  buPROPion (WELLBUTRIN XL) 300 MG 24 hr tablet Take 1 tablet (300 mg total) by mouth daily. 06/14/18 12/09/18  Nelwyn Salisbury, MD    Physical Exam: Vitals:   01/15/20 0938 01/15/20 1340 01/15/20 1400 01/15/20 1556  BP: 113/60 (!) 117/54 128/69 133/89  Pulse: 75 65 63 75  Resp: 17 (!) Temp:      TempSrc:      SpO2:  100% 98% 97%  Weight:      Height:         . General:  Appears calm and comfortable and is NAD, mildly somnolent . Eyes:  EOMI, normal lids, iris . ENT:  grossly normal hearing, lips & tongue, mmm . Neck:  no LAD, masses or thyromegal . Cardiovascular:  RRR, no m/r/g. No LE edema.  Marland Kitchen Respiratory:   CTA bilaterally with no wheezes/rales/rhonchi.  Normal respiratory effort at rest, reports significant dyspnea with attempt to sit up. . Abdomen:  soft, NT, ND, NABS . Skin:  no rash or induration seen on limited exam . Musculoskeletal:   grossly normal tone BUE/BLE, good ROM, no bony abnormality . Psychiatric:  Eccentric mood and affect, speech fluent and appropriate with ?mild cognitive slowing, AOx3 . Neurologic:  CN 2-12 grossly intact, moves all extremities in coordinated fashion    Radiological Exams on Admission: Independently reviewed - see discussion in A/P where applicable  CT Head Wo Contrast  Result Date: 01/15/2020 CLINICAL DATA:  Persistent headache EXAM: CT HEAD WITHOUT CONTRAST TECHNIQUE: Contiguous axial images were obtained from the base of the skull through the vertex without intravenous contrast. COMPARISON:  None. FINDINGS: Brain: Ventricles and sulci are within normal limits for age. There is no intracranial mass, hemorrhage, extra-axial fluid collection, or midline shift. Brain parenchyma appears unremarkable. No evident acute infarct. Vascular: No hyperdense vessel.  No vascular calcification evident. Skull: Bony calvarium appears intact. Sinuses/Orbits: There is opacification with air-fluid level in the left maxillary antrum. There is opacification and mucosal thickening in multiple ethmoid air cells. Postoperative changes are noted near each temporomandibular joint. Orbits appear symmetric bilaterally. Other: Mastoid air cells are clear. IMPRESSION: Foci of paranasal sinus disease. No mass or hemorrhage. No findings indicative of acute infarct. Electronically Signed   By: Bretta Bang III M.D.   On: 01/15/2020 09:38   CT Angio Chest PE W and/or Wo Contrast  Result Date: 01/15/2020 CLINICAL DATA:  Chest pain or shortness of breath. Pleurisy or effusion suspected. EXAM: CT ANGIOGRAPHY CHEST WITH CONTRAST TECHNIQUE: Multidetector CT imaging of the chest was performed using the standard protocol during bolus administration of intravenous contrast. Multiplanar CT image reconstructions and MIPs were obtained to evaluate the vascular anatomy. CONTRAST:  OMNIPAQUE IOHEXOL 350 MG/ML SOLN COMPARISON:   08/19/2017 FINDINGS: Cardiovascular: Lobar to segmental filling defect with a portion in the right upper lobe and a portion at the inter lobar pulmonary artery.  No additional pulmonary embolism is seen. No right heart dilatation. Normal heart size. No pericardial effusion. Mediastinum/Nodes: Negative for adenopathy or mass Lungs/Pleura: Intermittent airway collapse compatible with bronchomalacia. Mild atelectasis or scarring on the left. There is no edema, consolidation, effusion, or pneumothorax. Upper Abdomen: Reported separately Musculoskeletal: Negative Review of the MIP images confirms the above findings. Critical Value/emergent results were called by telephone at the time of interpretation on 01/15/2020 at 9:46 am to provider [facility] RAY , who verbally acknowledged these results. IMPRESSION: Single pulmonary embolism that is lobar to segmental at the right upper lobe. Electronically Signed   By: Marnee Spring M.D.   On: 01/15/2020 09:46   CT ABDOMEN PELVIS W CONTRAST  Result Date: 01/15/2020 CLINICAL DATA:  Diffuse pain.  Left lower quadrant pain. EXAM: CT ABDOMEN AND PELVIS WITH CONTRAST TECHNIQUE: Multidetector CT imaging of the abdomen and pelvis was performed using the standard protocol following bolus administration of intravenous contrast. CONTRAST:  OMNIPAQUE IOHEXOL 350 MG/ML SOLN COMPARISON:  None. FINDINGS: Lower chest:  Reported separately. Hepatobiliary: Hepatic steatosis with sparing at the gallbladder fossa.Two probable gas containing gallstone towards the neck of the gallbladder without evidence of acute cholecystitis. No bile duct dilatation. Pancreas: Unremarkable. Spleen: Generalized atrophy.  No acute finding. Adrenals/Urinary Tract: Negative adrenals. No hydronephrosis or stone. Unremarkable bladder. Stomach/Bowel:  No obstruction. No appendicitis. Vascular/Lymphatic: No acute vascular abnormality. No mass or adenopathy. Reproductive:2.7 cm right ovarian cystic density, simple  by CT and below size threshold for imaging follow-up per consensus guidelines. Small hypodense structure at the right cornua, likely a partially cystic fibroid, only 6 mm in diameter. Other: No ascites or pneumoperitoneum. Musculoskeletal: No acute abnormalities. Multilevel disc degeneration. IMPRESSION: 1. No acute finding. 2. Hepatic steatosis and probable cholelithiasis. 3. 2.7 cm right ovarian cyst which has a simple CT appearance. Electronically Signed   By: Marnee Spring M.D.   On: 01/15/2020 09:51   DG Hip Unilat W or Wo Pelvis 2-3 Views Left  Result Date: 01/15/2020 CLINICAL DATA:  Severe bilateral leg pain since last night. Generalized body aches EXAM: DG HIP (WITH OR WITHOUT PELVIS) 2-3V LEFT COMPARISON:  None. FINDINGS: There is no evidence of hip fracture or dislocation. There is no evidence of arthropathy or other focal bone abnormality. IMPRESSION: Negative. Electronically Signed   By: Marnee Spring M.D.   On: 01/15/2020 06:25   DG HIP UNILAT WITH PELVIS 2-3 VIEWS RIGHT  Result Date: 01/15/2020 CLINICAL DATA:  Severe right hip pain. EXAM: DG HIP (WITH OR WITHOUT PELVIS) 2-3V RIGHT COMPARISON:  None. FINDINGS: Both hips are normally located. No significant degenerative changes or evidence of AVN. The right hip is intact. The bony pelvis is intact. IMPRESSION: No acute bony findings. Electronically Signed   By: Rudie Meyer M.D.   On: 01/15/2020 06:28    EKG: Independently reviewed.  NSR with rate 65; no evidence of acute ischemia   Labs on Admission: I have personally reviewed the available labs and imaging studies at the time of the admission.  Pertinent labs:   Glucose 119 WBC 10.7 COVID/flu negative UA WNL UDS + BZD and opiates D-dimer 1.27   Assessment/Plan Principal Problem:   Pulmonary embolism (HCC) Active Problems:   Essential hypertension   MENOPAUSAL SYNDROME   Bipolar I disorder (HCC)   COPD (chronic obstructive pulmonary disease) (HCC)   PE -Patient  with h/o chronic respiratory failure due to COPD, on home O2 qhs -She presented with diffuse and generalized pain as well as  SOB -No hypoxia -She was found to have a lobar PE without heart strain -She was started on Eliquis and was going to be observed overnight -However, she showed significant behavioral disturbance in the ER and eventually decided to leave AMA -She was given a one-week supply of Eliquis 10 mg daily and instructed that she MUST see/call her PCP for ongoing therapy (5 mg daily for likely 6-12 months) due to this life-threatening condition. -The patient understands that thromboembolic disease can be catastrophic and even deadly and that she must be complaint with physician appointments and anticoagulation. -She also needs to stop taking progesterone.  Chronic pain -I have reviewed this patient in the [place] Controlled Substances Reporting System.  She is receiving medications from only one provider and appears to be taking them as prescribed. -She is receiving escalating doses of opiates and demonstrated hostile and belligerent behaviors alternating with periods of somnolence while in the ER. -She is at particularly high risk of opioid misuse, diversion, or overdose.  Menopausal syndrome -Patient will need to stop taking progesterone therapy due to her active VTE and should be considered no longer a candidate for hormonal therapy.  Bipolar -Continue Ativan, Seroquel, Ambien/trazodone (should not be taking both)  COPD -Resume home Symbicort, Albuterol, Spiriva  HTN -Continue home Norvasc, Hyzaar    Note: This patient has been tested and is negative for the novel coronavirus COVID-19. The patient has been fully vaccinated against COVID-19.    The patient was recommended for admission but signed out AMA.  She was given 1 week of supply of Eliquis.  I have sent a Secure Chat message to her PCP and communicated directly with him.  She needs: 1- Outpatient PCP f/u within the  week 2- Ongoing dosing of Eliquis 5 mg daily upon completion of her current 7 day supply of 10 mg daily. 3- To stop HRT/progesterone therapy.    Jonah BlueJennifer Dagen Beevers MD [facility]   How to contact the [facility] Attending or Consulting provider 7A - 7P or covering provider during after hours 7P -7A, for this patient?  1. Check the care team in [facility] and look for a) attending/consulting [facility] provider listed and b) the [facility] team listed 2. Log into [email or web address] and use [place]'s universal password to access. If you do not have the password, please contact the hospital operator. 3. Locate the [facility] provider you are looking for under [facility] and page to a number that you can be directly reached. 4. If you still have difficulty reaching the provider, please page the [facility] (Director on Call) for the Hospitalists listed on amion for assistance.   01/15/2020, 4:08 PM

## 2020-01-15 NOTE — ED Triage Notes (Signed)
Pt states that she has been having severe bilateral leg pain since last night, pt has generalized body aches, denies injury,, reports low grade fevers at home, hx of fibromyalgia

## 2020-01-15 NOTE — ED Notes (Signed)
Visitor at bedside.

## 2020-01-15 NOTE — ED Notes (Signed)
C/o generalized bodyaches x 2 days, states she hasn't slept in days is very sleepy now. However will arouse and is oriented. States last pm she became paralyzed and wasn't able to walk, however she is able to walk now.

## 2020-01-15 NOTE — ED Notes (Signed)
Walked patient to the bathroom patient is now back in bed on the monitor patient is sitting up with eating her tray with call bell in reach

## 2020-01-15 NOTE — ED Notes (Signed)
Patient crying out this RN went to see if there was anything I could do to help her. She became verbally aggressive with me in which this RN told patient it was not ok to speak to staff in the manor in which she was. She wants to know if she is going home or if she is staying. This RN informed her that I would notify her nurse of the question. She continued to be rude to staff. This RN explained to her that her nurse had 5 patients and that he would return to her room to address her questions when he was available to. She continued to be rude and this RN left the patients room.

## 2020-01-15 NOTE — ED Notes (Signed)
Pt returned from CT. Fluids paused so that second troponin can be collected.

## 2020-01-15 NOTE — Telephone Encounter (Signed)
Pts spouse is calling in stating that the pt is at the hospital with a blood clot in her lungs and is wanting to leave going against the medical advise at the hospital.  Spouse would like to see if Dr. Clent Ridges could convince the pt that she is at the best place to get medical assistance.  He was transferred to Triage Nurse for more assistance since he was not able to speak with Dr. Clent Ridges.

## 2020-01-15 NOTE — ED Notes (Signed)
Pt states that she wants to leave AMA and that she is serious about it. Pt is tearful.  Dr. Ophelia Charter is aware.

## 2020-01-15 NOTE — ED Provider Notes (Signed)
[facility]: [facility] EMERGENCY DEPARTMENT Provider Note   CSN: [id number] Arrival date & time: 01/15/20  [id number]     History Chief Complaint  Patient presents with  . Leg Pain    Laurie Schaefer is a 64 y.o. female.  HPI    64 year old female history of depression, fibromyalgia, bipolar disorder, COPD presents today complaining of 6 days of headache, chest pain, abdominal pain, and diffuse myalgias.  She states this began with fever and headache.  She describes the headache as diffuse but improving.  She took muscle relaxant yesterday for neck pain which she repeated today.  She states her temperature has been as high as 101.  She also endorses cough, dyspnea, diffuse abdominal pain, and generalized weakness.  She denies any similar symptoms in the past. Reviewed med record, pmdp, patietn with lorazepam 2 mg tid and oxycodone at recent increase from 20 mg of oxycodone qid now increased to 30 mg-patient and boyfriend state they have not received a 30 mg prescription, but then states that it was obtained but [place] still [place].  Patient states that she does not take the lorazepam or oxycodone as much as it is prescribed.  She states that she only takes a couple of the oxycodone daily.  She says that it strength was increased due to the fact that does not work.  She reports that she takes the Ativan a couple nights per week.  PMD P reviewed and oxycodone 30 mg filled 1021, 20 mg #120 filled 921 and prior to that July 26 10 mg/325 filled Ativan 2 mg 90 dispensed 1018 and 9 4   Past Medical History:  Diagnosis Date  . Arthritis   . Bipolar disorder (HCC)   . Chronic neck pain    had [facility] Salli RealYun Sun [facility]   . COPD (chronic obstructive pulmonary disease) (HCC)    sees Dr. Delton CoombesByrum   . Depression   . Fibromyalgia   . Fibromyalgia   . Headache   . Hypertension     Patient Active Problem List   Diagnosis Date Noted  . Chest wall pain 12/08/2018   . Noncompliance 12/08/2018  . COPD with acute exacerbation (HCC) 02/04/2018  . Fatigue 12/08/2017  . Preventative health care 12/08/2017  . Atelectasis 11/25/2017  . Cough variant asthma vs uacs/vcd 07/18/2017  . DOE (dyspnea on exertion) 07/16/2017  . Fibromyalgia 05/03/2017  . Migraines 04/12/2017  . Chronic respiratory failure with hypoxia (HCC) 04/01/2017  . Allergic rhinitis 03/13/2016  . Vocal cord dysfunction 01/29/2016  . COPD (chronic obstructive pulmonary disease) (HCC) 12/19/2015  . Obesity 12/19/2015  . Bipolar I disorder (HCC) 08/27/2015  . INSOMNIA, CHRONIC 11/03/2006  . Depression 11/03/2006  . Essential hypertension 11/03/2006  . MENOPAUSAL SYNDROME 11/03/2006  . Osteoarthritis 11/03/2006    Past Surgical History:  Procedure Laterality Date  . FRACTURE SURGERY       OB History   No obstetric history on file.     History reviewed. No pertinent family history.  Social History   Tobacco Use  . Smoking status: Former Smoker    Packs/day: 0.80    Years: 25.00    Pack years: 20.00    Types: Cigarettes    Quit date: 03/09/1990    Years since quitting: 29.8  . Smokeless tobacco: Never Used  . Tobacco comment: 1 cig every 2 weeks  Substance Use Topics  . Alcohol use: No    Alcohol/week: 0.0 standard drinks  .  Drug use: No    Home Medications Prior to Admission medications   Medication Sig Start Date End Date Taking? Authorizing Provider  albuterol (VENTOLIN HFA) 108 (90 Base) MCG/ACT inhaler INHALE 2 PUFFS INTO THE LUNGS EVERY 6 HOURS AS NEEDED FOR WHEEZING OR SHORTNESS OF BREATH 05/30/19   Parrett, Tammy S, NP  amLODipine (NORVASC) 5 MG tablet Take 1 tablet (5 mg total) by mouth daily. 02/13/19   Nelwyn Salisbury, MD  azithromycin (ZITHROMAX) 250 MG tablet Take 2 tablets (500mg  total) today,then 1 tablet (250mg ) x4 days 10/24/19   , MD  budesonide-formoterol [facility]) 160-4.5 MCG/ACT inhaler Inhale 2 puffs into the lungs 2 (two) times  daily. Patient not taking: Reported on 09/06/2019 05/30/19   Parrett, 09/08/2019, NP  cholecalciferol (VITAMIN D) 1000 units tablet Take 1,000 Units by mouth daily.    [provider]  estradiol (ESTRACE) 1 MG tablet TAKE 1 TABLET EVERY DAY 04/07/19   Virgel Bouquet, MD  Fexofenadine HCl [facility] ALLERGY PO) Take 1 tablet by mouth daily as needed (allergies/congestion.).     [provider]  FLUAD QUADRIVALENT 0.5 ML injection  06/06/19   [provider]  fluticasone (FLONASE) 50 MCG/ACT nasal spray Place 2 sprays into both nostrils daily. 03/13/16   06/08/19, MD  ibuprofen (ADVIL) 800 MG tablet Take 1 tablet (800 mg total) by mouth every 6 (six) hours as needed. for pain 02/13/19   Leslye Peer, MD  levalbuterol 14/7/20) 0.31 MG/3ML nebulizer solution Take 3 mLs (0.31 mg total) by nebulization every 4 (four) hours as needed for wheezing. 09/06/19   Pauline Aus, NP  levalbuterol (XOPENEX) 0.63 MG/3ML nebulizer solution INHALE 09/08/19 BY MOUTH VIA NEBULIZER EVERY 4 HOURS AS NEEDED FOR WHEEZE OR SHORTNESS OF BREATH 06/12/19   Parrett, , NP  linaclotide (LINZESS) 145 MCG CAPS capsule Take 1 capsule (145 mcg total) by mouth daily before breakfast. 04/11/19   Virgel Bouquet, MD  LORazepam (ATIVAN) 2 MG tablet TAKE 1 TABLET(2 MG) BY MOUTH EVERY 8 HOURS AS NEEDED FOR ANXIETY 07/12/19   Nelwyn Salisbury, MD  losartan-hydrochlorothiazide (HYZAAR) 100-25 MG tablet Take 1 tablet by mouth daily. 02/13/19   Nelwyn Salisbury, MD  medroxyPROGESTERone (PROVERA) 2.5 MG tablet Take 1 tablet (2.5 mg total) by mouth daily. 06/14/18   Nelwyn Salisbury, MD  OXYGEN 3lpm with sleep and exertion if needed    [provider]  pantoprazole (PROTONIX) 40 MG tablet Take 1 tablet (40 mg total) by mouth daily. 07/12/19   Nelwyn Salisbury, MD  PREVNAR 13 SUSP injection  06/06/19   [provider]  QUEtiapine (SEROQUEL) 50 MG tablet Take 1 tablet (50 mg total) by mouth 2 (two) times daily. 07/12/19    06/08/19, MD  Tiotropium Bromide Monohydrate (SPIRIVA RESPIMAT) 2.5 MCG/ACT AERS Inhale 2 puffs into the lungs daily. Patient not taking: Reported on 09/06/2019 05/30/19   Parrett, 09/08/2019, NP  traZODone (DESYREL) 100 MG tablet Take 1 tablet (100 mg total) by mouth at bedtime. 02/24/19   Virgel Bouquet, MD  zolpidem (AMBIEN) 10 MG tablet TAKE 1 TABLET BY MOUTH EVERY NIGHT AT BEDTIME AS NEEDED SLEEP 07/12/19   Nelwyn Salisbury, MD  buPROPion (WELLBUTRIN XL) 300 MG 24 hr tablet Take 1 tablet (300 mg total) by mouth daily. 06/14/18 12/09/18  08/14/18, MD    Allergies    Codeine  Review of Systems   Review of  Systems  Physical Exam Updated Vital Signs BP (!) 100/53 (BP Location: Left Arm)   Pulse 67   Temp 98.2 F (36.8 C) (Oral)   Resp 18   Ht 1.651 m (5\' 5" )   Wt 81.6 kg   SpO2 93%   BMI 29.95 kg/m   Physical Exam Vitals and nursing note reviewed.  Constitutional:      General: She is not in acute distress.    Appearance: Normal appearance.  HENT:     Head: Normocephalic.     Right Ear: External ear normal.     Left Ear: External ear normal.     Nose: Nose normal.     Mouth/Throat:     Mouth: Mucous membranes are dry.  Eyes:     Extraocular Movements: Extraocular movements intact.     Pupils: Pupils are equal, round, and reactive to light.  Cardiovascular:     Rate and Rhythm: Normal rate and regular rhythm.     Pulses: Normal pulses.     Heart sounds: Normal heart sounds.  Pulmonary:     Effort: Pulmonary effort is normal.     Breath sounds: Normal breath sounds.  Abdominal:     General: Abdomen is flat.     Palpations: Abdomen is soft.  Musculoskeletal:        General: Normal range of motion.     Cervical back: Normal range of motion.  Skin:    General: Skin is warm.     Capillary Refill: Capillary refill takes less than 2 seconds.  Neurological:     General: No focal deficit present.     Mental Status: She is alert.  Psychiatric:        Mood and  Affect: Mood normal.     ED Results / Procedures / Treatments   Labs (all labs ordered are listed, but only abnormal results are displayed) Labs Reviewed  CBC - Abnormal; Notable for the following components:      Result Value   WBC 10.7 (*)    All other components within normal limits  BASIC METABOLIC PANEL - Abnormal; Notable for the following components:   Glucose, Bld 119 (*)    Calcium 8.8 (*)    All other components within normal limits  CBG MONITORING, ED - Abnormal; Notable for the following components:   Glucose-Capillary 108 (*)    All other components within normal limits  RESPIRATORY PANEL BY RT PCR (FLU A&B, COVID)  D-DIMER, QUANTITATIVE (NOT AT [facility])  URINALYSIS, ROUTINE W REFLEX MICROSCOPIC  RAPID URINE DRUG SCREEN, HOSP PERFORMED  TROPONIN I (HIGH SENSITIVITY)    EKG EKG Interpretation  Date/Time:  Monday January 15 2020 07:44:17 EST Ventricular Rate:  65 PR Interval:    QRS Duration: 94 QT Interval:  410 QTC Calculation: 427 R Axis:   50 Text Interpretation: Sinus rhythm Confirmed by 02-04-1987 [phone]) on 01/15/2020 8:16:38 AM   Radiology DG Hip Unilat W or Wo Pelvis 2-3 Views Left  Result Date: 01/15/2020 CLINICAL DATA:  Severe bilateral leg pain since last night. Generalized body aches EXAM: DG HIP (WITH OR WITHOUT PELVIS) 2-3V LEFT COMPARISON:  None. FINDINGS: There is no evidence of hip fracture or dislocation. There is no evidence of arthropathy or other focal bone abnormality. IMPRESSION: Negative. Electronically Signed   By: 13/10/2019 M.D.   On: 01/15/2020 06:25   DG HIP UNILAT WITH PELVIS 2-3 VIEWS RIGHT  Result Date: 01/15/2020 CLINICAL DATA:  Severe right hip pain. EXAM: DG  HIP (WITH OR WITHOUT PELVIS) 2-3V RIGHT COMPARISON:  None. FINDINGS: Both hips are normally located. No significant degenerative changes or evidence of AVN. The right hip is intact. The bony pelvis is intact. IMPRESSION: No acute bony findings. Electronically Signed    By: Rudie Meyer M.D.   On: 01/15/2020 06:28    Procedures Procedures (including critical care time)  Medications Ordered in ED Medications  sodium chloride 0.9 % bolus 1,000 mL (has no administration in time range)  ketorolac (TORADOL) 15 MG/ML injection 15 mg (has no administration in time range)  acetaminophen (TYLENOL) tablet 650 mg (650 mg Oral Given 01/15/20 0526)    ED Course  I have reviewed the triage vital signs and the nursing notes.  Pertinent labs & imaging results that were available during my care of the patient were reviewed by me and considered in my medical decision making (see chart for details).    MDM Rules/Calculators/A&P                          9:45 AM Received call from radiologist.  Patient with right upper lobe PE.  1- dyspnea- patient with known copd- no wheezing noted on exam PE noted on cta.  Plan anticoagulation with eliquis. 2- generalized pain in patient with known fibromyalgia and on chronic narcotics 3 headache CT obtained no evidence of acute intracerebral hemorrhage, low suspicion for meningitis, patient has evidence of sinusitis noted 4 patient reports episode of weakness that lasted 10 minutes and unable to walk last night.  And since resolved.  Her neurological exam is normal. Discussed with Dr. Ophelia Charter and she will see for admission Final Clinical Impression(s) / ED Diagnoses Final diagnoses:  Other acute pulmonary embolism with acute cor pulmonale [facility])    Rx / DC Orders ED Discharge Orders    None       Margarita Grizzle, MD 01/15/20 1042

## 2020-01-15 NOTE — Progress Notes (Signed)
ANTICOAGULATION CONSULT NOTE - Initial Consult  Pharmacy Consult for Eliquis Indication: pulmonary embolus  Allergies  Allergen Reactions  . Codeine Nausea Only    Patient Measurements: Height: 5\' 5"  (165.1 cm) Weight: 81.6 kg (180 lb) IBW/kg (Calculated) : 57  Vital Signs: Temp: 98.2 F (36.8 C) (11/08 0646) Temp Source: Oral (11/08 0646) BP: 113/60 (11/08 0938) Pulse Rate: 75 (11/08 0938)  Labs: Recent Labs    01/15/20 0526 01/15/20 0811 01/15/20 0956  HGB 14.0  --   --   HCT 41.9  --   --   PLT 265  --   --   LABPROT  --   --  13.8  INR  --   --  1.1  CREATININE 0.76  --   --   TROPONINIHS  --  3  --     Estimated Creatinine Clearance: 73.9 mL/min (by C-G formula based on SCr of 0.76 mg/dL).   Medical History: Past Medical History:  Diagnosis Date  . Arthritis   . Bipolar disorder (HCC)   . Chronic neck pain    had previously [facility]. 13/08/21 at [facility]   . COPD (chronic obstructive pulmonary disease) (HCC)    sees Dr. [facility]   . Depression   . Fibromyalgia   . Headache   . Hypertension     Assessment: 53 YOF presenting with CP/HA, with single PE R-upper lobe on chest CT, no right heart strain.  Not on anticoagulation PTA, CBC wnl.    Goal of Therapy:  Monitor platelets by anticoagulation protocol: Yes   Plan:  Eliquis 10mg  PO BID x 7d, then 5mg  PO BID thereafter Monitor renal function, s/s bleeding  74, PharmD Clinical Pharmacist ED Pharmacist Phone # [phone] 01/15/2020 10:52 AM

## 2020-01-16 ENCOUNTER — Other Ambulatory Visit (HOSPITAL_COMMUNITY): Payer: Self-pay | Admitting: Internal Medicine

## 2020-01-16 DIAGNOSIS — I2699 Other pulmonary embolism without acute cor pulmonale: Secondary | ICD-10-CM | POA: Diagnosis not present

## 2020-01-16 LAB — BASIC METABOLIC PANEL
Anion gap: 10 (ref 5–15)
BUN: 11 mg/dL (ref 8–23)
CO2: 28 mmol/L (ref 22–32)
Calcium: 9 mg/dL (ref 8.9–10.3)
Chloride: 104 mmol/L (ref 98–111)
Creatinine, Ser: 0.79 mg/dL (ref 0.44–1.00)
GFR, Estimated: >60 mL/min (ref >60)
Glucose, Bld: 102 mg/dL — ABNORMAL HIGH (ref 70–99)
Potassium: 3.5 mmol/L (ref 3.5–5.1)
Sodium: 142 mmol/L (ref 135–145)

## 2020-01-16 LAB — CBC
HCT: 39.3 % (ref 36.0–46.0)
Hemoglobin: 12.8 g/dL (ref 12.0–15.0)
MCH: 28.9 pg (ref 26.0–34.0)
MCHC: 32.6 g/dL (ref 30.0–36.0)
MCV: 88.7 fL (ref 80.0–100.0)
Platelets: 229 10*3/uL (ref 150–400)
RBC: 4.43 MIL/uL (ref 3.87–5.11)
RDW: 13.2 % (ref 11.5–15.5)
WBC: 9.4 10*3/uL (ref 4.0–10.5)
nRBC: 0 % (ref 0.0–0.2)

## 2020-01-16 LAB — HIV ANTIBODY (ROUTINE TESTING W REFLEX): HIV Screen 4th Generation wRfx: NONREACTIVE

## 2020-01-16 MED ORDER — APIXABAN 5 MG PO TABS: 10.0000 mg | ORAL_TABLET | Freq: Two times a day (BID) | ORAL | 0 refills | Status: DC

## 2020-01-16 MED ORDER — APIXABAN 5 MG PO TABS
5.0000 mg | ORAL_TABLET | Freq: Two times a day (BID) | ORAL | 0 refills | Status: DC
Start: 2020-01-22 — End: 2020-01-26

## 2020-01-16 MED ORDER — METHOCARBAMOL 500 MG PO TABS: 500.0000 mg | ORAL_TABLET | Freq: Three times a day (TID) | ORAL | 0 refills | Status: DC | PRN

## 2020-01-16 MED FILL — METHOCARBAMOL 500 MG TABS: 500 | 6 days supply | Qty: 20 | Fill #0

## 2020-01-16 MED FILL — ELIQUIS 5 MG TABLET: 5 | 30 days supply | Qty: 74 | Fill #0

## 2020-01-16 NOTE — Progress Notes (Signed)
HOSPITAL MEDICINE OVERNIGHT EVENT NOTE    Notified by patient placement and flow manager that patient is now agreeable to be admitted to the hospital despite requesting to leave AGAINST MEDICAL ADVICE yesterday afternoon.  Patient was due to be admitted for acute pulmonary embolism by Dr. Ophelia Charter.  All orders have already been placed, patient is currently being treated with Eliquis initiated by Dr. Ophelia Charter several hours ago.  We will cancel the discharge order and place patient in observation medical telemetry bed.  Marinda Elk  MD [facility]

## 2020-01-16 NOTE — TOC Progression Note (Signed)
Transition of Care [facility]) - Progression Note    Patient Details  : Laurie Schaefer MRN: [id number] Date of Birth: 10/23/1955  Transition of Care [facility]) CM/SW Contact  Beckie Busing, RN Phone Number: 01/16/2020, 3:57 PM  Clinical Narrative:    [facility] consulted for patient discharging on Eliquis. 30 day supply has been provided by [facility] pharmacy. 30 day eliquis card has been given to the patient. TOC pharmacy benefit check 30 day copay is $45. Patient made aware that medication will be $45/ month. CM asked patient is she is able to afford the co pay and patient tells CM that she can not afford the copay but that she will follow up with her PCP in one week and that her PCP would be willing to change the medication if needed. Patient is also rambling on about her shoe being missing and that she was going to file a complaint about her shoe. Patient has friend at bedside that states that patient is able to afford the $45 copay. Patient tells friend to shut up and stay out of her business. Patient and bedside nurse are discussing the patient making her PCP aware if she is unable to afford medication. Patient states that she will be ok and she will take the medication once she is home. TOC will sign off.        Expected Discharge Plan and Services           Expected Discharge Date: 01/16/20                                     Social Determinants of Health (SDOH) Interventions    Readmission Risk Interventions No flowsheet data found.

## 2020-01-16 NOTE — Plan of Care (Signed)
  Problem: Clinical Measurements: Goal: Respiratory complications will improve Outcome: Progressing   Problem: Clinical Measurements: Goal: Cardiovascular complication will be avoided Outcome: Progressing   Problem: Coping: Goal: Level of anxiety will decrease Outcome: Progressing   Problem: Safety: Goal: Ability to remain free from injury will improve Outcome: Progressing   

## 2020-01-16 NOTE — Progress Notes (Signed)
Discharge instructions and care provided by nursing staff and case management to both patient and significant other. Both escorted off unit via wheelchair by nursing.

## 2020-01-16 NOTE — Discharge Summary (Signed)
Physician Discharge Summary  Laurie Schaefer ZOX:[id number] DOB: [birth year] DOA: 01/15/2020  PCP: Nelwyn SalisburyFry, Stephen A, MD  Admit date: 01/15/2020 Discharge date: 01/16/2020  Admitted From: Home Disposition:  Home  Discharge Condition:Stable CODE STATUS: DNR Diet recommendation: Heart Healthy   Brief/Interim Summary:  HPI( as per Dr  Ophelia CharterYates): Laurie Schaefer is a 64 y.o. female with medical history significant of HTN; bipolar; fibromyalgia; and COPD presenting with generalized pain.  She reports that she has been having pain in her chest and generalized body for about 1 week.  Also with SOB (has COPD and wears nightly O2), worse than baseline.  Last night, she felt "paralyzed in my hips" and was unable to get out of bed.  Symptoms lasted for 10-12 minutes.  She also had sharp, dagger-like pains in her legs.  The weakness has resolved but she continues to feel generalized pain.  Right now she feels "awful" due to the diffuse pain, and also feels "dehydrated" and hungry.  She has some apparent cognitive impairment, with cognitive slowing at times and random questions/behavior ("Are you the hospitalist?" asked at a random time during the evaluation followed by cackling).  No prior h/o VTE.  Hospital course:  Hospital course remained stable.  She remained hemodynamically stable.  She was on room air this morning, did not complain of any shortness of breath.  All she is concerned  is about her chronic generalized pain.  She has been started on Eliquis.  She needs to follow-up with pain management specialist as an outpatient. We have recommended her to stop taking progesterone.  Other home medications will be resumed. Patient is hemodynamically stable for discharge to home today.   Discharge Diagnoses:  Principal Problem:   Pulmonary embolism (HCC) Active Problems:   Essential hypertension   MENOPAUSAL SYNDROME   Bipolar I disorder (HCC)   COPD (chronic obstructive pulmonary disease) (HCC)    Acute pulmonary embolism (HCC)    Discharge Instructions  Discharge Instructions    Call MD for:   Complete by: As directed    Shortness of breath, worsening of your clinical condition   Diet - low sodium heart healthy   Complete by: As directed    Discharge instructions   Complete by: As directed    You have a life-threatening blood clot and must take blood thinners for however long your PCP recommends (usually 6-12 months with a first time clot).  You have been provided with a prescription for 7 days of medication and then will need a new prescription.  Please schedule an appointment with your PCP or call for the new dose of medication before it runs out.   Discharge instructions   Complete by: As directed    1)Please take prescribed medication as instructed 2)Follow up with your PCP in a week 3)Follow up with pain management as an outpatient. 4)Stop taking Provera   Increase activity slowly   Complete by: As directed    Increase activity slowly   Complete by: As directed      Allergies as of 01/16/2020      Reactions   Codeine Nausea Only      Medication List    STOP taking these medications   ibuprofen 800 MG tablet Commonly known as: ADVIL   medroxyPROGESTERone 2.5 MG tablet Commonly known as: PROVERA     TAKE these medications   albuterol 108 (90 Base) MCG/ACT inhaler Commonly known as: VENTOLIN HFA INHALE 2 PUFFS INTO THE LUNGS EVERY 6 HOURS AS  NEEDED FOR WHEEZING OR SHORTNESS OF BREATH What changed: See the new instructions.   amLODipine 5 MG tablet Commonly known as: NORVASC Take 1 tablet (5 mg total) by mouth daily.   apixaban 5 MG Tabs tablet Commonly known as: ELIQUIS Take 2 tablets (10 mg total) by mouth 2 (two) times daily for 6 days.   apixaban 5 MG Tabs tablet Commonly known as: ELIQUIS Take 1 tablet (5 mg total) by mouth 2 (two) times daily. Start taking on: January 22, 2020   budesonide-formoterol 160-4.5 MCG/ACT inhaler Commonly known  as: Symbicort Inhale 2 puffs into the lungs 2 (two) times daily. What changed:   when to take this  reasons to take this   cholecalciferol 1000 units tablet Commonly known as: VITAMIN D Take 1,000 Units by mouth daily.   fluticasone 50 MCG/ACT nasal spray Commonly known as: FLONASE Place 2 sprays into both nostrils daily. What changed:   when to take this  reasons to take this   levalbuterol 0.63 MG/3ML nebulizer solution Commonly known as: XOPENEX INHALE BY MOUTH VIA NEBULIZER EVERY 4 HOURS AS NEEDED FOR WHEEZE OR SHORTNESS OF BREATH What changed: See the new instructions.   LORazepam 2 MG tablet Commonly known as: ATIVAN TAKE 1 TABLET(2 MG) BY MOUTH EVERY 8 HOURS AS NEEDED FOR ANXIETY What changed: See the new instructions.   losartan-hydrochlorothiazide 100-25 MG tablet Commonly known as: HYZAAR Take 1 tablet by mouth daily.   methocarbamol 500 MG tablet Commonly known as: Robaxin Take 1 tablet (500 mg total) by mouth every 8 (eight) hours as needed for muscle spasms.   MUCINEX ALLERGY PO Take 1 tablet by mouth daily as needed (allergies/congestion.).   OXYGEN 3lpm with sleep and exertion if needed   pantoprazole 40 MG tablet Commonly known as: PROTONIX Take 1 tablet (40 mg total) by mouth daily.   QUEtiapine 50 MG tablet Commonly known as: SEROquel Take 1 tablet (50 mg total) by mouth 2 (two) times daily.   Spiriva Respimat 2.5 MCG/ACT Aers Generic drug: Tiotropium Bromide Monohydrate Inhale 2 puffs into the lungs daily.   traZODone 100 MG tablet Commonly known as: DESYREL Take 1 tablet (100 mg total) by mouth at bedtime. What changed:   when to take this  reasons to take this   zolpidem 10 MG tablet Commonly known as: AMBIEN TAKE 1 TABLET BY MOUTH EVERY NIGHT AT BEDTIME AS NEEDED SLEEP What changed:   how much to take  how to take this  when to take this  reasons to take this       Follow-up Information    Nelwyn Salisbury, MD.  Schedule an appointment as soon as possible for a visit in 1 week(s).   Specialty: Family Medicine Contact information: [address] [place] [address] [phone]              Allergies  Allergen Reactions  . Codeine Nausea Only    Consultations:  None   Procedures/Studies: CT Head Wo Contrast  Result Date: 01/15/2020 CLINICAL DATA:  Persistent headache EXAM: CT HEAD WITHOUT CONTRAST TECHNIQUE: Contiguous axial images were obtained from the base of the skull through the vertex without intravenous contrast. COMPARISON:  None. FINDINGS: Brain: Ventricles and sulci are within normal limits for age. There is no intracranial mass, hemorrhage, extra-axial fluid collection, or midline shift. Brain parenchyma appears unremarkable. No evident acute infarct. Vascular: No hyperdense vessel.  No vascular calcification evident. Skull: Bony calvarium appears intact. Sinuses/Orbits: There is opacification  with air-fluid level in the left maxillary antrum. There is opacification and mucosal thickening in multiple ethmoid air cells. Postoperative changes are noted near each temporomandibular joint. Orbits appear symmetric bilaterally. Other: Mastoid air cells are clear. IMPRESSION: Foci of paranasal sinus disease. No mass or hemorrhage. No findings indicative of acute infarct. Electronically Signed   By: Bretta Bang III M.D.   On: 01/15/2020 09:38   CT Angio Chest PE W and/or Wo Contrast  Result Date: 01/15/2020 CLINICAL DATA:  Chest pain or shortness of breath. Pleurisy or effusion suspected. EXAM: CT ANGIOGRAPHY CHEST WITH CONTRAST TECHNIQUE: Multidetector CT imaging of the chest was performed using the standard protocol during bolus administration of intravenous contrast. Multiplanar CT image reconstructions and MIPs were obtained to evaluate the vascular anatomy. CONTRAST:  OMNIPAQUE IOHEXOL 350 MG/ML SOLN COMPARISON:  08/19/2017 FINDINGS: Cardiovascular: Lobar to  segmental filling defect with a portion in the right upper lobe and a portion at the inter lobar pulmonary artery. No additional pulmonary embolism is seen. No right heart dilatation. Normal heart size. No pericardial effusion. Mediastinum/Nodes: Negative for adenopathy or mass Lungs/Pleura: Intermittent airway collapse compatible with bronchomalacia. Mild atelectasis or scarring on the left. There is no edema, consolidation, effusion, or pneumothorax. Upper Abdomen: Reported separately Musculoskeletal: Negative Review of the MIP images confirms the above findings. Critical Value/emergent results were called by telephone at the time of interpretation on 01/15/2020 at 9:46 am to provider [facility] RAY , who verbally acknowledged these results. IMPRESSION: Single pulmonary embolism that is lobar to segmental at the right upper lobe. Electronically Signed   By: Marnee Spring M.D.   On: 01/15/2020 09:46   CT ABDOMEN PELVIS W CONTRAST  Result Date: 01/15/2020 CLINICAL DATA:  Diffuse pain.  Left lower quadrant pain. EXAM: CT ABDOMEN AND PELVIS WITH CONTRAST TECHNIQUE: Multidetector CT imaging of the abdomen and pelvis was performed using the standard protocol following bolus administration of intravenous contrast. CONTRAST:  OMNIPAQUE IOHEXOL 350 MG/ML SOLN COMPARISON:  None. FINDINGS: Lower chest:  Reported separately. Hepatobiliary: Hepatic steatosis with sparing at the gallbladder fossa.Two probable gas containing gallstone towards the neck of the gallbladder without evidence of acute cholecystitis. No bile duct dilatation. Pancreas: Unremarkable. Spleen: Generalized atrophy.  No acute finding. Adrenals/Urinary Tract: Negative adrenals. No hydronephrosis or stone. Unremarkable bladder. Stomach/Bowel:  No obstruction. No appendicitis. Vascular/Lymphatic: No acute vascular abnormality. No mass or adenopathy. Reproductive:2.7 cm right ovarian cystic density, simple by CT and below size threshold for imaging  follow-up per consensus guidelines. Small hypodense structure at the right cornua, likely a partially cystic fibroid, only 6 mm in diameter. Other: No ascites or pneumoperitoneum. Musculoskeletal: No acute abnormalities. Multilevel disc degeneration. IMPRESSION: 1. No acute finding. 2. Hepatic steatosis and probable cholelithiasis. 3. 2.7 cm right ovarian cyst which has a simple CT appearance. Electronically Signed   By: Marnee Spring M.D.   On: 01/15/2020 09:51   DG Hip Unilat W or Wo Pelvis 2-3 Views Left  Result Date: 01/15/2020 CLINICAL DATA:  Severe bilateral leg pain since last night. Generalized body aches EXAM: DG HIP (WITH OR WITHOUT PELVIS) 2-3V LEFT COMPARISON:  None. FINDINGS: There is no evidence of hip fracture or dislocation. There is no evidence of arthropathy or other focal bone abnormality. IMPRESSION: Negative. Electronically Signed   By: Marnee Spring M.D.   On: 01/15/2020 06:25   DG HIP UNILAT WITH PELVIS 2-3 VIEWS RIGHT  Result Date: 01/15/2020 CLINICAL DATA:  Severe right hip pain. EXAM: DG HIP (WITH  OR WITHOUT PELVIS) 2-3V RIGHT COMPARISON:  None. FINDINGS: Both hips are normally located. No significant degenerative changes or evidence of AVN. The right hip is intact. The bony pelvis is intact. IMPRESSION: No acute bony findings. Electronically Signed   By: Rudie Meyer M.D.   On: 01/15/2020 06:28       Subjective: Patient seen and examined at the bedside this morning.  Hemodynamically stable for discharge.  Discharge Exam: Vitals:   01/16/20 0359 01/16/20 0503  BP: (!) 103/50 (!) 126/59  Pulse: 69 70  Resp: 18 20  Temp: 98.2 F (36.8 C) 98 F (36.7 C)  SpO2: 98% 99%   Vitals:   01/15/20 2132 01/16/20 0002 01/16/20 0359 01/16/20 0503  BP: 130/61 125/72 (!) 103/50 (!) 126/59  Pulse: 78 69 69 70  Resp: 18 13 18 20   Temp:   98.2 F (36.8 C) 98 F (36.7 C)  TempSrc:   Oral   SpO2: 97% 95% 98% 99%  Weight:      Height:        General: Pt is alert,  awake, not in acute distress Cardiovascular: RRR, S1/S2 +, no rubs, no gallops Respiratory: CTA bilaterally, no wheezing, no rhonchi Abdominal: Soft, NT, ND, bowel sounds + Extremities: no edema, no cyanosis    The results of significant diagnostics from this hospitalization (including imaging, microbiology, ancillary and laboratory) are listed below for reference.     Microbiology: Recent Results (from the past 240 hour(s))  Respiratory Panel by RT PCR (Flu A&B, Covid) - Nasopharyngeal Swab     Status: None   Collection Time: 01/15/20  8:06 AM   Specimen: Nasopharyngeal Swab  Result Value Ref Range Status   SARS Coronavirus 2 by RT PCR NEGATIVE NEGATIVE Final    Comment: (NOTE) SARS-CoV-2 target nucleic acids are NOT DETECTED.  The SARS-CoV-2 RNA is generally detectable in upper respiratoy specimens during the acute phase of infection. The lowest concentration of SARS-CoV-2 viral copies this assay can detect is 131 copies/mL. A negative result does not preclude SARS-Cov-2 infection and should not be used as the sole basis for treatment or other patient management decisions. A negative result may occur with  improper specimen collection/handling, submission of specimen other than nasopharyngeal swab, presence of viral mutation(s) within the areas targeted by this assay, and inadequate number of viral copies (<131 copies/mL). A negative result must be combined with clinical observations, patient history, and epidemiological information. The expected result is Negative.  Fact Sheet for Patients:  13/08/21  Fact Sheet for Healthcare Providers:  https://www.moore.com/  This test is no t yet approved or cleared by the https://www.young.biz/ FDA and  has been authorized for detection and/or diagnosis of SARS-CoV-2 by FDA under an Emergency Use Authorization (EUA). This EUA will remain  in effect (meaning this test can be used) for the  duration of the COVID-19 declaration under Section 564(b)(1) of the Act, 21 U.S.C. section 360bbb-3(b)(1), unless the authorization is terminated or revoked sooner.     Influenza A by PCR NEGATIVE NEGATIVE Final   Influenza B by PCR NEGATIVE NEGATIVE Final    Comment: (NOTE) The Xpert Xpress SARS-CoV-2/FLU/RSV assay is intended as an aid in  the diagnosis of influenza from Nasopharyngeal swab specimens and  should not be used as a sole basis for treatment. Nasal washings and  aspirates are unacceptable for Xpert Xpress SARS-CoV-2/FLU/RSV  testing.  Fact Sheet for Patients: [place]  Fact Sheet for Healthcare Providers: https://www.moore.com/  This test is not yet  approved or cleared by the [place] and  has been authorized for detection and/or diagnosis of SARS-CoV-2 by  FDA under an Emergency Use Authorization (EUA). This EUA will remain  in effect (meaning this test can be used) for the duration of the  Covid-19 declaration under Section 564(b)(1) of the Act, 21  U.S.C. section 360bbb-3(b)(1), unless the authorization is  terminated or revoked. Performed at [facility], [address], [place], [place] [address]      Labs: BNP (last 3 results) No results for input(s): BNP in the last 8760 hours. Basic Metabolic Panel: Recent Labs  Lab 01/15/20 0526 01/16/20 0218  NA 137 142  K 3.7 3.5  CL 102 104  CO2 26 28  GLUCOSE 119* 102*  BUN 8 11  CREATININE 0.76 0.79  CALCIUM 8.8* 9.0   Liver Function Tests: No results for input(s): AST, ALT, ALKPHOS, BILITOT, PROT, ALBUMIN in the last 168 hours. No results for input(s): LIPASE, AMYLASE in the last 168 hours. No results for input(s): AMMONIA in the last 168 hours. CBC: Recent Labs  Lab 01/15/20 0526 01/16/20 0218  WBC 10.7* 9.4  HGB 14.0 12.8  HCT 41.9 39.3  MCV 88.2 88.7  PLT 265 229   Cardiac Enzymes: No results for input(s): CKTOTAL, CKMB,  CKMBINDEX, TROPONINI in the last 168 hours. BNP: Invalid input(s): POCBNP CBG: Recent Labs  Lab 01/15/20 0650  GLUCAP 108*   D-Dimer Recent Labs    01/15/20 0811  DDIMER 1.27*   Hgb A1c No results for input(s): HGBA1C in the last 72 hours. Lipid Profile No results for input(s): CHOL, HDL, LDLCALC, TRIG, CHOLHDL, LDLDIRECT in the last 72 hours. Thyroid function studies No results for input(s): TSH, T4TOTAL, T3FREE, THYROIDAB in the last 72 hours.  Invalid input(s): FREET3 Anemia work up No results for input(s): VITAMINB12, FOLATE, FERRITIN, TIBC, IRON, RETICCTPCT in the last 72 hours. Urinalysis    Component Value Date/Time   COLORURINE YELLOW 01/15/2020 0827   APPEARANCEUR CLEAR 01/15/2020 0827   LABSPEC 1.015 01/15/2020 0827   PHURINE 5.0 01/15/2020 0827   GLUCOSEU NEGATIVE 01/15/2020 0827   HGBUR NEGATIVE 01/15/2020 0827   BILIRUBINUR NEGATIVE 01/15/2020 0827   KETONESUR NEGATIVE 01/15/2020 0827   PROTEINUR NEGATIVE 01/15/2020 0827   UROBILINOGEN 0.2 12/09/2018 1100   NITRITE NEGATIVE 01/15/2020 0827   LEUKOCYTESUR NEGATIVE 01/15/2020 0827   Sepsis Labs Invalid input(s): PROCALCITONIN,  WBC,  LACTICIDVEN Microbiology Recent Results (from the past 240 hour(s))  Respiratory Panel by RT PCR (Flu A&B, Covid) - Nasopharyngeal Swab     Status: None   Collection Time: 01/15/20  8:06 AM   Specimen: Nasopharyngeal Swab  Result Value Ref Range Status   SARS Coronavirus 2 by RT PCR NEGATIVE NEGATIVE Final    Comment: (NOTE) SARS-CoV-2 target nucleic acids are NOT DETECTED.  The SARS-CoV-2 RNA is generally detectable in upper respiratoy specimens during the acute phase of infection. The lowest concentration of SARS-CoV-2 viral copies this assay can detect is 131 copies/mL. A negative result does not preclude SARS-Cov-2 infection and should not be used as the sole basis for treatment or other patient management decisions. A negative result may occur with  improper  specimen collection/handling, submission of specimen other than nasopharyngeal swab, presence of viral mutation(s) within the areas targeted by this assay, and inadequate number of viral copies (<131 copies/mL). A negative result must be combined with clinical observations, patient history, and epidemiological information. The expected result is Negative.  Fact Sheet  for Patients:  https://www.moore.com/  Fact Sheet for Healthcare Providers:  https://www.young.biz/  This test is no t yet approved or cleared by the [place] FDA and  has been authorized for detection and/or diagnosis of SARS-CoV-2 by FDA under an Emergency Use Authorization (EUA). This EUA will remain  in effect (meaning this test can be used) for the duration of the COVID-19 declaration under Section 564(b)(1) of the Act, 21 U.S.C. section 360bbb-3(b)(1), unless the authorization is terminated or revoked sooner.     Influenza A by PCR NEGATIVE NEGATIVE Final   Influenza B by PCR NEGATIVE NEGATIVE Final    Comment: (NOTE) The Xpert Xpress SARS-CoV-2/FLU/RSV assay is intended as an aid in  the diagnosis of influenza from Nasopharyngeal swab specimens and  should not be used as a sole basis for treatment. Nasal washings and  aspirates are unacceptable for Xpert Xpress SARS-CoV-2/FLU/RSV  testing.  Fact Sheet for Patients: https://www.moore.com/  Fact Sheet for Healthcare Providers: https://www.young.biz/  This test is not yet approved or cleared by the [place] FDA and  has been authorized for detection and/or diagnosis of SARS-CoV-2 by  FDA under an Emergency Use Authorization (EUA). This EUA will remain  in effect (meaning this test can be used) for the duration of the  Covid-19 declaration under Section 564(b)(1) of the Act, 21  U.S.C. section 360bbb-3(b)(1), unless the authorization is  terminated or revoked. Performed at  [facility], [address], [place], [place] [address]     Please note: You were cared for by a hospitalist during your hospital stay. Once you are discharged, your primary care physician will handle any further medical issues. Please note that NO REFILLS for any discharge medications will be authorized once you are discharged, as it is imperative that you return to your primary care physician (or establish a relationship with a primary care physician if you do not have one) for your post hospital discharge needs so that they can reassess your need for medications and monitor your lab values.    Time coordinating discharge: 40 minutes  SIGNED:   Burnadette Pop, MD  [facility] 01/16/2020, 11:11 AM Pager [phone]  If 7PM-7AM, please contact night-coverage [email or web address] Password [email or web address]

## 2020-01-16 NOTE — Progress Notes (Signed)
Nursing staff responded to patient's room after she pulled to code blue button mistaken it for the phone she says. Patient then angry and belligerent towards staff for responding and then not leaving her unsupervised while her primary nurse gathered her medication. Staff will continue to round frequently, utilizing bed alarm and leaving door open for visibility

## 2020-01-16 NOTE — Telephone Encounter (Signed)
Patient was admitted

## 2020-01-17 NOTE — H&P (Signed)
H&P  Laurie Schaefer GNF:[id number] DOB: 1955/09/13 DOA: 01/15/2020  PCP: Nelwyn Salisbury, MD Consultants:  Delton Coombes - pulmonology Patient coming from:  Home - lives with significant other; NOK: Significant other, Laurie Schaefer, [phone]  Chief Complaint: Generalized pain  HPI: Laurie Schaefer is a 64 y.o. female with medical history significant of HTN; bipolar; fibromyalgia; and COPD presenting with generalized pain.  She reports that she has been having pain in her chest and generalized body for about 1 week.  Also with SOB (has COPD and wears nightly O2), worse than baseline.  Last night, she felt "paralyzed in my hips" and was unable to get out of bed.  Symptoms lasted for 10-12 minutes.  She also had sharp, dagger-like pains in her legs.  The weakness has resolved but she continues to feel generalized pain.  Right now she feels "awful" due to the diffuse pain, and also feels "dehydrated" and hungry.  She has some apparent cognitive impairment, with cognitive slowing at times and random questions/behavior ("Are you the hospitalist?" asked at a random time during the evaluation followed by cackling).  No prior h/o VTE.    ED Course:  Multiple complaints.  Found to have RUL PE.  Chronic pain, generalized pain.  Episodic weakness - couldn't walk last night.  Review of Systems: As per HPI; otherwise review of systems reviewed and negative.    COVID Vaccine Status:   Complete plus booster      Past Medical History:  Diagnosis Date  . Arthritis   . Bipolar disorder (HCC)   . Chronic neck pain    had previously seen Dr. Salli Real at [facility]   . COPD (chronic obstructive pulmonary disease) (HCC)    sees Dr. Delton Coombes   . Depression   . Fibromyalgia   . Headache   . Hypertension          Past Surgical History:  Procedure Laterality Date  . FRACTURE SURGERY      Social History        Socioeconomic History  . Marital status: Divorced     Spouse name: Not on file  . Number of children: 0  . Years of education: Not on file  . Highest education level: Not on file  Occupational History  . Not on file  Tobacco Use  . Smoking status: Former Smoker    Packs/day: 0.80    Years: 25.00    Pack years: 20.00    Types: Cigarettes    Quit date: 03/09/1990    Years since quitting: 29.8  . Smokeless tobacco: Never Used  . Tobacco comment: 1 cig every 2 weeks  Substance and Sexual Activity  . Alcohol use: No    Alcohol/week: 0.0 standard drinks  . Drug use: No  . Sexual activity: Not on file  Other Topics Concern  . Not on file  Social History Narrative   Divorced, HH 1   No children   States she's been to graduate school    Social Determinants of Health      Financial Resource Strain:   . Difficulty of Paying Living Expenses: Not on file  Food Insecurity:   . Worried About Programme researcher, broadcasting/film/video in the Last Year: Not on file  . Ran Out of Food in the Last Year: Not on file  Transportation Needs:   . Lack of Transportation (Medical): Not on file  . Lack of Transportation (Non-Medical): Not on file  Physical Activity:   .  Days of Exercise per Week: Not on file  . Minutes of Exercise per Session: Not on file  Stress:   . Feeling of Stress : Not on file  Social Connections:   . Frequency of Communication with Friends and Family: Not on file  . Frequency of Social Gatherings with Friends and Family: Not on file  . Attends Religious Services: Not on file  . Active Member of Clubs or Organizations: Not on file  . Attends BankerClub or Organization Meetings: Not on file  . Marital Status: Not on file  Intimate Partner Violence:   . Fear of Current or Ex-Partner: Not on file  . Emotionally Abused: Not on file  . Physically Abused: Not on file  . Sexually Abused: Not on file        Allergies  Allergen Reactions  . Codeine Nausea Only    History reviewed. No pertinent family history.          Prior to Admission medications   Medication Sig Start Date End Date Taking? Authorizing Provider  albuterol (VENTOLIN HFA) 108 (90 Base) MCG/ACT inhaler INHALE 2 PUFFS INTO THE LUNGS EVERY 6 HOURS AS NEEDED FOR WHEEZING OR SHORTNESS OF BREATH 05/30/19   Parrett, Tammy S, NP  amLODipine (NORVASC) 5 MG tablet Take 1 tablet (5 mg total) by mouth daily. 02/13/19   Nelwyn SalisburyFry, Stephen A, MD  azithromycin (ZITHROMAX) 250 MG tablet Take 2 tablets (500mg  total) today,then 1 tablet (250mg ) x4 days 10/24/19   Leslye PeerByrum, Robert S, MD  budesonide-formoterol [facility](SYMBICORT) 160-4.5 MCG/ACT inhaler Inhale 2 puffs into the lungs 2 (two) times daily. Patient not taking: Reported on 09/06/2019 05/30/19   Parrett, Virgel Bouquetammy S, NP  cholecalciferol (VITAMIN D) 1000 units tablet Take 1,000 Units by mouth daily.    [provider]  estradiol (ESTRACE) 1 MG tablet TAKE 1 TABLET EVERY DAY 04/07/19   Nelwyn SalisburyFry, Stephen A, MD  Fexofenadine HCl [facility](MUCINEX ALLERGY PO) Take 1 tablet by mouth daily as needed (allergies/congestion.).     [provider]  FLUAD QUADRIVALENT 0.5 ML injection  06/06/19   [provider]  fluticasone (FLONASE) 50 MCG/ACT nasal spray Place 2 sprays into both nostrils daily. 03/13/16   Leslye PeerByrum, Robert S, MD  ibuprofen (ADVIL) 800 MG tablet Take 1 tablet (800 mg total) by mouth every 6 (six) hours as needed. for pain 02/13/19   Nelwyn SalisburyFry, Stephen A, MD  levalbuterol [facility](XOPENEX) 0.31 MG/3ML nebulizer solution Take 3 mLs (0.31 mg total) by nebulization every 4 (four) hours as needed for wheezing. 09/06/19   Bevelyn NgoGroce, Sarah F, NP  levalbuterol (XOPENEX) 0.63 MG/3ML nebulizer solution INHALE 3ML BY MOUTH VIA NEBULIZER EVERY 4 HOURS AS NEEDED FOR WHEEZE OR SHORTNESS OF BREATH 06/12/19   Parrett, Virgel Bouquetammy S, NP  linaclotide (LINZESS) 145 MCG CAPS capsule Take 1 capsule (145 mcg total) by mouth daily before breakfast. 04/11/19   Nelwyn SalisburyFry, Stephen A, MD  LORazepam (ATIVAN) 2 MG tablet TAKE 1 TABLET(2 MG) BY MOUTH  EVERY 8 HOURS AS NEEDED FOR ANXIETY 07/12/19   Nelwyn SalisburyFry, Stephen A, MD  losartan-hydrochlorothiazide (HYZAAR) 100-25 MG tablet Take 1 tablet by mouth daily. 02/13/19   Nelwyn SalisburyFry, Stephen A, MD  medroxyPROGESTERone (PROVERA) 2.5 MG tablet Take 1 tablet (2.5 mg total) by mouth daily. 06/14/18   Nelwyn SalisburyFry, Stephen A, MD  OXYGEN 3lpm with sleep and exertion if needed    [provider]  pantoprazole (PROTONIX) 40 MG tablet Take 1 tablet (40 mg total) by mouth daily. 07/12/19   Nelwyn SalisburyFry, Stephen A,  MD  PREVNAR 13 SUSP injection  06/06/19   [provider]  QUEtiapine (SEROQUEL) 50 MG tablet Take 1 tablet (50 mg total) by mouth 2 (two) times daily. 07/12/19   Nelwyn Salisbury, MD  Tiotropium Bromide Monohydrate (SPIRIVA RESPIMAT) 2.5 MCG/ACT AERS Inhale 2 puffs into the lungs daily. Patient not taking: Reported on 09/06/2019 05/30/19   Parrett, Virgel Bouquet, NP  traZODone (DESYREL) 100 MG tablet Take 1 tablet (100 mg total) by mouth at bedtime. 02/24/19   Nelwyn Salisbury, MD  zolpidem (AMBIEN) 10 MG tablet TAKE 1 TABLET BY MOUTH EVERY NIGHT AT BEDTIME AS NEEDED SLEEP 07/12/19   Nelwyn Salisbury, MD  buPROPion (WELLBUTRIN XL) 300 MG 24 hr tablet Take 1 tablet (300 mg total) by mouth daily. 06/14/18 12/09/18  Nelwyn Salisbury, MD    Physical Exam:       Vitals:   01/15/20 0938 01/15/20 1340 01/15/20 1400 01/15/20 1556  BP: 113/60 (!) 117/54 128/69 133/89  Pulse: 75 65 63 75  Resp: 17 (!) 21 13 16   Temp:      TempSrc:      SpO2:  100% 98% 97%  Weight:      Height:          General:  Appears calm and comfortable and is NAD, mildly somnolent  Eyes:  EOMI, normal lids, iris  ENT:  grossly normal hearing, lips & tongue, mmm  Neck:  no LAD, masses or thyromegal  Cardiovascular:  RRR, no m/r/g. No LE edema.   Respiratory:   CTA bilaterally with no wheezes/rales/rhonchi.  Normal respiratory effort at rest, reports significant dyspnea with attempt to sit up.  Abdomen:  soft, NT,  ND, NABS  Skin:  no rash or induration seen on limited exam  Musculoskeletal:  grossly normal tone BUE/BLE, good ROM, no bony abnormality  Psychiatric:  Eccentric mood and affect, speech fluent and appropriate with ?mild cognitive slowing, AOx3  Neurologic:  CN 2-12 grossly intact, moves all extremities in coordinated fashion    Radiological Exams on Admission: Independently reviewed - see discussion in A/P where applicable   Imaging Results (Last 48 hours)  CT Head Wo Contrast  Result Date: 01/15/2020 CLINICAL DATA:  Persistent headache EXAM: CT HEAD WITHOUT CONTRAST TECHNIQUE: Contiguous axial images were obtained from the base of the skull through the vertex without intravenous contrast. COMPARISON:  None. FINDINGS: Brain: Ventricles and sulci are within normal limits for age. There is no intracranial mass, hemorrhage, extra-axial fluid collection, or midline shift. Brain parenchyma appears unremarkable. No evident acute infarct. Vascular: No hyperdense vessel.  No vascular calcification evident. Skull: Bony calvarium appears intact. Sinuses/Orbits: There is opacification with air-fluid level in the left maxillary antrum. There is opacification and mucosal thickening in multiple ethmoid air cells. Postoperative changes are noted near each temporomandibular joint. Orbits appear symmetric bilaterally. Other: Mastoid air cells are clear. IMPRESSION: Foci of paranasal sinus disease. No mass or hemorrhage. No findings indicative of acute infarct. Electronically Signed   By: 13/10/2019 III M.D.   On: 01/15/2020 09:38   CT Angio Chest PE W and/or Wo Contrast  Result Date: 01/15/2020 CLINICAL DATA:  Chest pain or shortness of breath. Pleurisy or effusion suspected. EXAM: CT ANGIOGRAPHY CHEST WITH CONTRAST TECHNIQUE: Multidetector CT imaging of the chest was performed using the standard protocol during bolus administration of intravenous contrast. Multiplanar CT image reconstructions  and MIPs were obtained to evaluate the vascular anatomy. CONTRAST:  13/10/2019 OMNIPAQUE IOHEXOL 350 MG/ML SOLN  COMPARISON:  08/19/2017 FINDINGS: Cardiovascular: Lobar to segmental filling defect with a portion in the right upper lobe and a portion at the inter lobar pulmonary artery. No additional pulmonary embolism is seen. No right heart dilatation. Normal heart size. No pericardial effusion. Mediastinum/Nodes: Negative for adenopathy or mass Lungs/Pleura: Intermittent airway collapse compatible with bronchomalacia. Mild atelectasis or scarring on the left. There is no edema, consolidation, effusion, or pneumothorax. Upper Abdomen: Reported separately Musculoskeletal: Negative Review of the MIP images confirms the above findings. Critical Value/emergent results were called by telephone at the time of interpretation on 01/15/2020 at 9:46 am to provider [facility] RAY , who verbally acknowledged these results. IMPRESSION: Single pulmonary embolism that is lobar to segmental at the right upper lobe. Electronically Signed   By: Marnee Spring M.D.   On: 01/15/2020 09:46   CT ABDOMEN PELVIS W CONTRAST  Result Date: 01/15/2020 CLINICAL DATA:  Diffuse pain.  Left lower quadrant pain. EXAM: CT ABDOMEN AND PELVIS WITH CONTRAST TECHNIQUE: Multidetector CT imaging of the abdomen and pelvis was performed using the standard protocol following bolus administration of intravenous contrast. CONTRAST:  OMNIPAQUE IOHEXOL 350 MG/ML SOLN COMPARISON:  None. FINDINGS: Lower chest:  Reported separately. Hepatobiliary: Hepatic steatosis with sparing at the gallbladder fossa.Two probable gas containing gallstone towards the neck of the gallbladder without evidence of acute cholecystitis. No bile duct dilatation. Pancreas: Unremarkable. Spleen: Generalized atrophy.  No acute finding. Adrenals/Urinary Tract: Negative adrenals. No hydronephrosis or stone. Unremarkable bladder. Stomach/Bowel:  No obstruction. No appendicitis.  Vascular/Lymphatic: No acute vascular abnormality. No mass or adenopathy. Reproductive:2.7 cm right ovarian cystic density, simple by CT and below size threshold for imaging follow-up per consensus guidelines. Small hypodense structure at the right cornua, likely a partially cystic fibroid, only 6 mm in diameter. Other: No ascites or pneumoperitoneum. Musculoskeletal: No acute abnormalities. Multilevel disc degeneration. IMPRESSION: 1. No acute finding. 2. Hepatic steatosis and probable cholelithiasis. 3. 2.7 cm right ovarian cyst which has a simple CT appearance. Electronically Signed   By: Marnee Spring M.D.   On: 01/15/2020 09:51   DG Hip Unilat W or Wo Pelvis 2-3 Views Left  Result Date: 01/15/2020 CLINICAL DATA:  Severe bilateral leg pain since last night. Generalized body aches EXAM: DG HIP (WITH OR WITHOUT PELVIS) 2-3V LEFT COMPARISON:  None. FINDINGS: There is no evidence of hip fracture or dislocation. There is no evidence of arthropathy or other focal bone abnormality. IMPRESSION: Negative. Electronically Signed   By: Marnee Spring M.D.   On: 01/15/2020 06:25   DG HIP UNILAT WITH PELVIS 2-3 VIEWS RIGHT  Result Date: 01/15/2020 CLINICAL DATA:  Severe right hip pain. EXAM: DG HIP (WITH OR WITHOUT PELVIS) 2-3V RIGHT COMPARISON:  None. FINDINGS: Both hips are normally located. No significant degenerative changes or evidence of AVN. The right hip is intact. The bony pelvis is intact. IMPRESSION: No acute bony findings. Electronically Signed   By: Rudie Meyer M.D.   On: 01/15/2020 06:28     EKG: Independently reviewed.  NSR with rate 65; no evidence of acute ischemia   Labs on Admission: I have personally reviewed the available labs and imaging studies at the time of the admission.  Pertinent labs:   Glucose 119 WBC 10.7 COVID/flu negative UA WNL UDS + BZD and opiates D-dimer 1.27   Assessment/Plan Principal Problem:   Pulmonary embolism (HCC) Active Problems:    Essential hypertension   MENOPAUSAL SYNDROME   Bipolar I disorder (HCC)   COPD (chronic obstructive pulmonary  disease) [facility])   PE -Patient with h/o chronic respiratory failure due to COPD, on home O2 qhs -She presented with diffuse and generalized pain as well as SOB -No hypoxia -She was found to have a lobar PE without heart strain -She was started on Eliquis and was going to be observed overnight -However, she showed significant behavioral disturbance in the ER and eventually decided to leave AMA -She was given a one-week supply of Eliquis 10 mg daily and instructed that she MUST see/call her PCP for ongoing therapy (5 mg daily for likely 6-12 months) due to this life-threatening condition. -The patient understands that thromboembolic disease can be catastrophic and even deadly and that she must be complaint with physician appointments and anticoagulation. -She also needs to stop taking progesterone. -Throughout the evening, the patient waxed and waned about this decision to leave AMA and ultimately ended up staying.  As a result, her Consult Note has been coverted to the H&P it was originally planned to be.  Chronic pain -I have reviewed this patient in the [place] Controlled Substances Reporting System.  She is receiving medications from only one provider and appears to be taking them as prescribed. -She is receiving escalating doses of opiates and demonstrated hostile and belligerent behaviors alternating with periods of somnolence while in the ER. -She is at particularly high risk of opioid misuse, diversion, or overdose.  Menopausal syndrome -Patient will need to stop taking progesterone therapy due to her active VTE and should be considered no longer a candidate for hormonal therapy.  Bipolar -Continue Ativan, Seroquel, Ambien/trazodone (should not be taking both)  COPD -Resume home Symbicort, Albuterol, Spiriva  HTN -Continue home Norvasc, Hyzaar    Note: This patient  has been tested and is negative for the novel coronavirus COVID-19. The patient has been fully vaccinated against COVID-19.    The patient was recommended for admission but decided to be out AMA.  She was given 1 week of supply of Eliquis.  I have sent a Secure Chat message to her PCP and communicated directly with him.  She needs: 1- Outpatient PCP f/u within the week 2- Ongoing dosing of Eliquis 5 mg daily upon completion of her current 7 day supply of 10 mg daily. 3- To stop HRT/progesterone therapy.  She subsequently decided not to leave AMA and so remained in the hospital overnight and was discharged the following day.  Jonah Blue MD [facility]   How to contact the [facility] Attending or Consulting provider 7A - 7P or covering provider during after hours 7P -7A, for this patient?  1. Check the care team in [facility] and look for a) attending/consulting [facility] provider listed and b) the [facility] team listed 2. Log into [email or web address] and use [place]'s universal password to access. If you do not have the password, please contact the hospital operator. 3. Locate the [facility] provider you are looking for under [facility] and page to a number that you can be directly reached. 4. If you still have difficulty reaching the provider, please page the [facility] (Director on Call) for the Hospitalists listed on amion for assistance.   01/15/2020, 4:08 PM

## 2020-01-19 ENCOUNTER — Other Ambulatory Visit: Payer: Self-pay | Admitting: Family Medicine

## 2020-01-19 NOTE — Telephone Encounter (Signed)
Patient wants to know if you can call in Wellbutrin in place fo the apixaban (ELIQUIS) 5 MG TABS tablet it is too expensive.   [id number] [address] [address], [place] - [address] Phone:  [phone]  Fax:  [phone]

## 2020-01-22 NOTE — Telephone Encounter (Signed)
Filled by Burnadette Pop, MD

## 2020-01-23 ENCOUNTER — Other Ambulatory Visit: Payer: Self-pay

## 2020-01-23 ENCOUNTER — Ambulatory Visit: Payer: Medicare HMO | Admitting: Family Medicine

## 2020-01-23 VITALS — BP 124/78 | HR 123 | Temp 98.1°F | Ht 65.0 in | Wt 210.2 lb

## 2020-01-24 ENCOUNTER — Ambulatory Visit: Payer: Medicare HMO | Admitting: Family Medicine

## 2020-01-25 ENCOUNTER — Encounter: Payer: Self-pay | Admitting: Cardiovascular Disease

## 2020-01-25 ENCOUNTER — Ambulatory Visit (INDEPENDENT_AMBULATORY_CARE_PROVIDER_SITE_OTHER): Payer: Medicare HMO | Admitting: Cardiovascular Disease

## 2020-01-25 ENCOUNTER — Ambulatory Visit: Payer: Medicare HMO | Admitting: Family Medicine

## 2020-01-25 ENCOUNTER — Telehealth: Payer: Self-pay | Admitting: Radiology

## 2020-01-25 VITALS — BP 106/68 | HR 119 | Ht 65.0 in | Wt 208.0 lb

## 2020-01-25 DIAGNOSIS — I10 Essential (primary) hypertension: Secondary | ICD-10-CM | POA: Diagnosis not present

## 2020-01-25 DIAGNOSIS — R000 Tachycardia, unspecified: Secondary | ICD-10-CM | POA: Diagnosis not present

## 2020-01-25 DIAGNOSIS — I2699 Other pulmonary embolism without acute cor pulmonale: Secondary | ICD-10-CM | POA: Diagnosis not present

## 2020-01-25 DIAGNOSIS — R002 Palpitations: Secondary | ICD-10-CM

## 2020-01-25 DIAGNOSIS — R0789 Other chest pain: Secondary | ICD-10-CM | POA: Diagnosis not present

## 2020-01-25 NOTE — Progress Notes (Signed)
Cardiology Office Note:   Date:  01/25/2020  NAME:  Laurie Schaefer    MRN: [id number] DOB:  January 04, 1956   PCP:  Nelwyn Salisbury, MD  Cardiologist:  No primary care provider on file.   Referring MD: Nelwyn Salisbury, MD   Chief Complaint  Patient presents with  . Tachycardia   History of Present Illness:   Laurie Schaefer is a 64 y.o. female with a hx of COPD, fibromyalgia, PE who is being seen today for the evaluation of tachycardia at the request of Nelwyn Salisbury, MD. Referred by pulmonary for tachycardia after inhaler use. Recent admission for PE 01/15/2020. CT pe study demonstrated no coronary calcium.   She reports for the last 6 months has had episodic heart racing spells.  She reports that her heart is beating fast.  Can occur several times per day.  It has been associated with inhaler use in the past but not always.  Tends to be associated with movement.  She does have moderate to severe COPD and requires chronic oxygen therapy.  Apparently when she exerts herself she can get profoundly short of breath and have chest tightness.  She will also get the rapid heartbeat sensation as well.  She was recently admitted to the hospital and diagnosed with a pulmonary embolism.  Her heart rate today is 114.  EKG demonstrates sinus tachycardia with no acute ischemic changes or evidence of prior infarction.  She also is quite tearful on today's examination.  She reports she is in a lot of pain.  She suffers from 5 myalgia.  She apparently missed a pain medicine dose before coming to her appointment as she was running late.  She reports she is very stressed and anxious about her recent diagnosis of pulmonary embolism.  She also reports her diagnosis of COPD has not been communicated to her.  She denies any lower extremity edema.  She has no orthopnea or PND.  Her symptoms of chest pain and chest tightness have occurred for years.  She is a never smoker.  She drinks no alcohol and uses no drugs.  She  does not work anymore.  There is no strong family history of heart disease.  She also endorses symptoms of hot flashes.  She reports she sweats all the time.  She denies any productive cough but does have a chronic cough.  She seems to be tolerating Eliquis well.  Her blood pressures well controlled.  She does need an updated TSH.  Her most recent hemoglobin shows she is not anemic with a value of 12.8.  Most recent lipid profile shows a total cholesterol 191, HDL 39, LDL 116, triglycerides 240.  Most recent A1c 5.8.  Problem List 1. COPD on 2L -moderate to severe COPD 2. PE 01/15/2020 3. Fibromyalgia   Past Medical History: Past Medical History:  Diagnosis Date  . Arthritis   . Bipolar disorder (HCC)   . Chronic neck pain    had previously seen Dr. Salli Real at [facility]   . COPD (chronic obstructive pulmonary disease) (HCC)    sees Dr. Delton Coombes   . Depression   . Fibromyalgia   . Headache   . Hypertension     Past Surgical History: Past Surgical History:  Procedure Laterality Date  . FRACTURE SURGERY      Current Medications: Current Meds  Medication Sig  . albuterol (VENTOLIN HFA) 108 (90 Base) MCG/ACT inhaler INHALE 2 PUFFS INTO THE LUNGS EVERY 6 HOURS  AS NEEDED FOR WHEEZING OR SHORTNESS OF BREATH (Patient taking differently: Inhale 2 puffs into the lungs every 6 (six) hours as needed for shortness of breath. )  . amLODipine (NORVASC) 5 MG tablet Take 1 tablet (5 mg total) by mouth daily.  Marland Kitchen apixaban (ELIQUIS) 5 MG TABS tablet Take 1 tablet (5 mg total) by mouth 2 (two) times daily.  . budesonide-formoterol (SYMBICORT) 160-4.5 MCG/ACT inhaler Inhale 2 puffs into the lungs 2 (two) times daily.  . cholecalciferol (VITAMIN D) 1000 units tablet Take 1,000 Units by mouth daily.  . fluticasone (FLONASE) 50 MCG/ACT nasal spray Place 2 sprays into both nostrils daily. (Patient taking differently: Place 2 sprays into both nostrils daily as needed for allergies. )  .  levalbuterol (XOPENEX) 0.63 MG/3ML nebulizer solution INHALE BY MOUTH VIA NEBULIZER EVERY 4 HOURS AS NEEDED FOR WHEEZE OR SHORTNESS OF BREATH (Patient taking differently: Take 0.63 mg by nebulization every 6 (six) hours as needed for shortness of breath. )  . LORazepam (ATIVAN) 2 MG tablet TAKE 1 TABLET(2 MG) BY MOUTH EVERY 8 HOURS AS NEEDED FOR ANXIETY  . losartan-hydrochlorothiazide (HYZAAR) 100-25 MG tablet Take 1 tablet by mouth daily.  . methocarbamol (ROBAXIN) 500 MG tablet Take 1 tablet (500 mg total) by mouth every 8 (eight) hours as needed for muscle spasms.  . OXYGEN 3lpm with sleep and exertion if needed  . pantoprazole (PROTONIX) 40 MG tablet Take 1 tablet (40 mg total) by mouth daily.  . QUEtiapine (SEROQUEL) 50 MG tablet Take 1 tablet (50 mg total) by mouth 2 (two) times daily. (Patient taking differently: Take 50 mg by mouth 2 (two) times daily. Pt takes 1 tablet once a day.)  . Tiotropium Bromide Monohydrate (SPIRIVA RESPIMAT) 2.5 MCG/ACT AERS Inhale 2 puffs into the lungs daily.  . traZODone (DESYREL) 100 MG tablet Take 1 tablet (100 mg total) by mouth at bedtime. (Patient taking differently: Take 100 mg by mouth at bedtime as needed for sleep. )  . zolpidem (AMBIEN) 10 MG tablet TAKE 1 TABLET BY MOUTH EVERY NIGHT AT BEDTIME AS NEEDED SLEEP (Patient taking differently: Take 10 mg by mouth at bedtime as needed for sleep. TAKE 1 TABLET BY MOUTH EVERY NIGHT AT BEDTIME AS NEEDED SLEEP)     Allergies:    Codeine   Social History: Social History   Socioeconomic History  . Marital status: Single    Spouse name: Not on file  . Number of children: 0  . Years of education: Not on file  . Highest education level: Not on file  Occupational History  . Not on file  Tobacco Use  . Smoking status: Never Smoker  . Smokeless tobacco: Never Used  Substance and Sexual Activity  . Alcohol use: No    Alcohol/week: 0.0 standard drinks  . Drug use: No  . Sexual activity: Not on file    Other Topics Concern  . Not on file  Social History Narrative   Divorced, HH 1   No children   States she's been to graduate school    Social Determinants of Health   Financial Resource Strain:   . Difficulty of Paying Living Expenses: Not on file  Food Insecurity:   . Worried About Programme researcher, broadcasting/film/video in the Last Year: Not on file  . Ran Out of Food in the Last Year: Not on file  Transportation Needs:   . Lack of Transportation (Medical): Not on file  . Lack of Transportation (Non-Medical): Not on file  Physical Activity:   . Days of Exercise per Week: Not on file  . Minutes of Exercise per Session: Not on file  Stress:   . Feeling of Stress : Not on file  Social Connections:   . Frequency of Communication with Friends and Family: Not on file  . Frequency of Social Gatherings with Friends and Family: Not on file  . Attends Religious Services: Not on file  . Active Member of Clubs or Organizations: Not on file  . Attends Banker Meetings: Not on file  . Marital Status: Not on file    Family History: The patient's family history includes Depression in her mother; Hypertension in her mother; Stroke in her father.  ROS:   All other ROS reviewed and negative. Pertinent positives noted in the HPI.     EKGs/Labs/Other Studies Reviewed:   The following studies were personally reviewed by me today:  EKG:  EKG is ordered today.  The ekg ordered today demonstrates sinus tachycardia, heart rate 121, no acute ischemic changes, no evidence of prior infarction, and was personally reviewed by me.   TTE 08/25/2017 - Left ventricle: The cavity size was normal. Wall thickness was  increased in a pattern of moderate LVH. Systolic function was  vigorous. The estimated ejection fraction was in the range of 65%  to 70%. Wall motion was normal; there were no regional wall  motion abnormalities. Doppler parameters are consistent with  abnormal left ventricular  relaxation (grade 1 diastolic  dysfunction).   Recent Labs: 07/12/2019: ALT 16; TSH 0.46 01/16/2020: BUN 11; Creatinine, Ser 0.79; Hemoglobin 12.8; Platelets 229; Potassium 3.5; Sodium 142   Recent Lipid Panel    Component Value Date/Time   CHOL 191 07/12/2019 1203   TRIG 240.0 (H) 07/12/2019 1203   HDL 39.50 07/12/2019 1203   CHOLHDL 5 07/12/2019 1203   VLDL 48.0 (H) 07/12/2019 1203   LDLDIRECT 116.0 07/12/2019 1203    Physical Exam:   VS:  BP 106/68 (BP Location: Right Arm, Patient Position: Sitting)   Pulse (!) 119   Ht 5\' 5"  (1.651 m)   Wt 208 lb (94.3 kg)   BMI 34.61 kg/m    Wt Readings from Last 3 Encounters:  01/25/20 208 lb (94.3 kg)  01/23/20 210 lb 3.2 oz (95.3 kg)  01/15/20 180 lb (81.6 kg)    General: Well nourished, well developed, in no acute distress Heart: Atraumatic, normal size  Eyes: PEERLA, EOMI  Neck: Supple, no JVD Endocrine: No thryomegaly Cardiac: Normal S1, S2; regular rate and rhythm, tachycardia noted, no murmurs rubs or gallops Lungs: Diminished breath sounds bilaterally Abd: Soft, nontender, no hepatomegaly  Ext: No edema, pulses 2+ Musculoskeletal: No deformities, BUE and BLE strength normal and equal Skin: Warm and dry, no rashes   Neuro: Alert and oriented to person, place, time, and situation, CNII-XII grossly intact, no focal deficits  Psych: Normal mood and affect   ASSESSMENT:   Analeise Mccleery is a 64 y.o. female who presents for the following: 1. Palpitations   2. Tachycardia   3. Essential hypertension   4. Other acute pulmonary embolism without acute cor pulmonale (HCC)   5. Other chest pain     PLAN:   1. Palpitations 2. Tachycardia 3. Essential hypertension 4. Other acute pulmonary embolism without acute cor pulmonale (HCC) 5. Other chest pain -She reports rapid heartbeat sensation with exertion and after inhaler use.  She has a multitude of reasons to have tachycardia.  She is tachycardic  today on examination.   She was recently diagnosed with a pulmonary embolism which will explain her tachycardia.  She also has COPD and is profoundly short of breath with exertion which will cause tachycardia.  Her TSH a year ago was within normal limits we will repeat that today.  She is not anemic with a recent hemoglobin value of 12.8.  She also endorses she is in a lot of pain.  She has fibromyalgia and suffers from chronic pain.  This could explain her tachycardia.  To exclude arrhythmia we will proceed with a 7-day Zio patch.  We will make sure there is no arrhythmia here.  I have a low suspicion for this.  She has high risk though given her history of COPD.  She will continue her Eliquis as prescribed for pulmonary embolism.  I think she is tachycardic from this.  She is on treatment and was evaluated in the hospital.  Blood pressure is stable.  She had an echocardiogram 2018 that was normal.  Her EKG today shows no ischemic changes.  Her chest pain is likely related to her COPD.  She had a recent CT PE study in the hospital and this demonstrated no coronary calcium.  I have no concerns for underlying obstructive CAD.  We will see her back in 3 months after the above work-up.   Disposition: Return in about 3 months (around 04/26/2020).  Medication Adjustments/Labs and Tests Ordered: Current medicines are reviewed at length with the patient today.  Concerns regarding medicines are outlined above.  Orders Placed This Encounter  Procedures  . TSH  . LONG TERM MONITOR (3-14 DAYS)  . EKG 12-Lead   No orders of the defined types were placed in this encounter.   Patient Instructions  Medication Instructions:  Your Physician recommend you continue on your current medication as directed.    *If you need a refill on your cardiac medications before your next appointment, please call your pharmacy*   Lab Work: Your physician recommends lab work today ( TSH).  If you have labs (blood work) drawn today and your tests are  completely normal, you will receive your results only by: Marland Kitchen. MyChart Message (if you have MyChart) OR . A paper copy in the mail If you have any lab test that is abnormal or we need to change your treatment, we will call you to review the results.   Testing/Procedures: Our physician has recommended that you wear an 3  DAY ZIO-PATCH monitor. The Zio patch cardiac monitor continuously records heart rhythm data for up to 14 days, this is for patients being evaluated for multiple types heart rhythms. For the first 24 hours post application, please avoid getting the Zio monitor wet in the shower or by excessive sweating during exercise. After that, feel free to carry on with regular activities. Keep soaps and lotions away from the ZIO XT Patch.   Someone from our office will call to verify address and mail monitor.     Follow-Up: At [facility], you and your health needs are our priority.  As part of our continuing mission to provide you with exceptional heart care, we have created designated Provider Care Teams.  These Care Teams include your primary Cardiologist (physician) and Advanced Practice Providers (APPs -  Physician Assistants and Nurse Practitioners) who all work together to provide you with the care you need, when you need it.  We recommend signing up for the patient portal called "MyChart".  Sign up information is provided  on this After Visit Summary.  MyChart is used to connect with patients for Virtual Visits (Telemedicine).  Patients are able to view lab/test results, encounter notes, upcoming appointments, etc.  Non-urgent messages can be sent to your provider as well.   To learn more about what you can do with MyChart, go to [email or web address].    Your next appointment:   3 month(s)  The format for your next appointment:   In Person  Provider:   Lennie Odor, MD  Christena Deem- Long Term Monitor Instructions   Your physician has requested you wear your ZIO patch  monitor__3_____days.   This is a single patch monitor.  [employer] supplies one patch monitor per enrollment.  Additional stickers are not available.   Please do not apply patch if you will be having a Nuclear Stress Test, Echocardiogram, Cardiac CT, MRI, or Chest Xray during the time frame you would be wearing the monitor. The patch cannot be worn during these tests.  You cannot remove and re-apply the ZIO XT patch monitor.   Your ZIO patch monitor will be sent USPS Priority mail from [facility] directly to your home address. The monitor may also be mailed to a PO BOX if home delivery is not available.   It may take 3-5 days to receive your monitor after you have been enrolled.   Once you have received you monitor, please review enclosed instructions.  Your monitor has already been registered assigning a specific monitor serial # to you.   Applying the monitor   Shave hair from upper left chest.   Hold abrader disc by orange tab.  Rub abrader in 40 strokes over left upper chest as indicated in your monitor instructions.   Clean area with 4 enclosed alcohol pads .  Use all pads to assure are is cleaned thoroughly.  Let dry.   Apply patch as indicated in monitor instructions.  Patch will be place under collarbone on left side of chest with arrow pointing upward.   Rub patch adhesive wings for 2 minutes.Remove white label marked "1".  Remove white label marked "2".  Rub patch adhesive wings for 2 additional minutes.   While looking in a mirror, press and release button in center of patch.  A small green light will flash 3-4 times .  This will be your only indicator the monitor has been turned on.     Do not shower for the first 24 hours.  You may shower after the first 24 hours.   Press button if you feel a symptom. You will hear a small click.  Record Date, Time and Symptom in the Patient Log Book.   When you are ready to remove patch, follow instructions on last 2 pages of Patient  Log Book.  Stick patch monitor onto last page of Patient Log Book.   Place Patient Log Book in [place] box.  Use locking tab on box and tape box closed securely.  The Orange and [employer] has [employer] on it.  Please place in mailbox as soon as possible.  Your physician should have your test results approximately 7 days after the monitor has been mailed back to [facility].   Call [facility] Customer Care at [phone] if you have questions regarding your ZIO XT patch monitor.  Call them immediately if you see an orange light blinking on your monitor.   If your monitor falls off in less than 4 days contact our Monitor department at [phone].  If your  monitor becomes loose or falls off after 4 days call [employer] at [phone] for suggestions on securing your monitor.        Signed, Lenna Gilford. Flora Lipps, MD [facility]  [address], [address] [place], [place] [address] [phone]  01/25/2020 6:59 PM

## 2020-01-25 NOTE — Telephone Encounter (Signed)
Enrolled patient for a 3 day Zio XT monitor to be mailed to patients home  

## 2020-01-25 NOTE — Patient Instructions (Signed)
Medication Instructions:  Your Physician recommend you continue on your current medication as directed.    *If you need a refill on your cardiac medications before your next appointment, please call your pharmacy*   Lab Work: Your physician recommends lab work today ( TSH).  If you have labs (blood work) drawn today and your tests are completely normal, you will receive your results only by: Marland Kitchen MyChart Message (if you have MyChart) OR . A paper copy in the mail If you have any lab test that is abnormal or we need to change your treatment, we will call you to review the results.   Testing/Procedures: Our physician has recommended that you wear an 3  DAY ZIO-PATCH monitor. The Zio patch cardiac monitor continuously records heart rhythm data for up to 14 days, this is for patients being evaluated for multiple types heart rhythms. For the first 24 hours post application, please avoid getting the Zio monitor wet in the shower or by excessive sweating during exercise. After that, feel free to carry on with regular activities. Keep soaps and lotions away from the ZIO XT Patch.   Someone from our office will call to verify address and mail monitor.     Follow-Up: At [facility], you and your health needs are our priority.  As part of our continuing mission to provide you with exceptional heart care, we have created designated Provider Care Teams.  These Care Teams include your primary Cardiologist (physician) and Advanced Practice Providers (APPs -  Physician Assistants and Nurse Practitioners) who all work together to provide you with the care you need, when you need it.  We recommend signing up for the patient portal called "MyChart".  Sign up information is provided on this After Visit Summary.  MyChart is used to connect with patients for Virtual Visits (Telemedicine).  Patients are able to view lab/test results, encounter notes, upcoming appointments, etc.  Non-urgent messages can be sent to  your provider as well.   To learn more about what you can do with MyChart, go to [email or web address].    Your next appointment:   3 month(s)  The format for your next appointment:   In Person  Provider:   Lennie Odor, MD  Christena Deem- Long Term Monitor Instructions   Your physician has requested you wear your ZIO patch monitor__3_____days.   This is a single patch monitor.  [employer] supplies one patch monitor per enrollment.  Additional stickers are not available.   Please do not apply patch if you will be having a Nuclear Stress Test, Echocardiogram, Cardiac CT, MRI, or Chest Xray during the time frame you would be wearing the monitor. The patch cannot be worn during these tests.  You cannot remove and re-apply the ZIO XT patch monitor.   Your ZIO patch monitor will be sent USPS Priority mail from [facility] directly to your home address. The monitor may also be mailed to a PO BOX if home delivery is not available.   It may take 3-5 days to receive your monitor after you have been enrolled.   Once you have received you monitor, please review enclosed instructions.  Your monitor has already been registered assigning a specific monitor serial # to you.   Applying the monitor   Shave hair from upper left chest.   Hold abrader disc by orange tab.  Rub abrader in 40 strokes over left upper chest as indicated in your monitor instructions.   Clean area with 4 enclosed alcohol pads .  Use all pads to assure are is cleaned thoroughly.  Let dry.   Apply patch as indicated in monitor instructions.  Patch will be place under collarbone on left side of chest with arrow pointing upward.   Rub patch adhesive wings for 2 minutes.Remove white label marked "1".  Remove white label marked "2".  Rub patch adhesive wings for 2 additional minutes.   While looking in a mirror, press and release button in center of patch.  A small green light will flash 3-4 times .  This will be your only  indicator the monitor has been turned on.     Do not shower for the first 24 hours.  You may shower after the first 24 hours.   Press button if you feel a symptom. You will hear a small click.  Record Date, Time and Symptom in the Patient Log Book.   When you are ready to remove patch, follow instructions on last 2 pages of Patient Log Book.  Stick patch monitor onto last page of Patient Log Book.   Place Patient Log Book in [place] box.  Use locking tab on box and tape box closed securely.  The [employer] and [employer] has [employer] on it.  Please place in mailbox as soon as possible.  Your physician should have your test results approximately 7 days after the monitor has been mailed back to [facility].   Call [facility] Customer Care at [phone] if you have questions regarding your ZIO XT patch monitor.  Call them immediately if you see an orange light blinking on your monitor.   If your monitor falls off in less than 4 days contact our Monitor department at [phone].  If your monitor becomes loose or falls off after 4 days call [employer] at [phone] for suggestions on securing your monitor.

## 2020-01-26 ENCOUNTER — Other Ambulatory Visit: Payer: Self-pay

## 2020-01-26 ENCOUNTER — Encounter: Payer: Self-pay | Admitting: Family Medicine

## 2020-01-26 ENCOUNTER — Ambulatory Visit (INDEPENDENT_AMBULATORY_CARE_PROVIDER_SITE_OTHER): Payer: Medicare HMO | Admitting: Family Medicine

## 2020-01-26 VITALS — BP 118/70 | HR 108 | Temp 98.6°F | Ht 65.0 in | Wt 210.6 lb

## 2020-01-26 DIAGNOSIS — J9611 Chronic respiratory failure with hypoxia: Secondary | ICD-10-CM

## 2020-01-26 DIAGNOSIS — I10 Essential (primary) hypertension: Secondary | ICD-10-CM | POA: Diagnosis not present

## 2020-01-26 DIAGNOSIS — M797 Fibromyalgia: Secondary | ICD-10-CM | POA: Diagnosis not present

## 2020-01-26 DIAGNOSIS — J439 Emphysema, unspecified: Secondary | ICD-10-CM

## 2020-01-26 DIAGNOSIS — F319 Bipolar disorder, unspecified: Secondary | ICD-10-CM

## 2020-01-26 DIAGNOSIS — I2699 Other pulmonary embolism without acute cor pulmonale: Secondary | ICD-10-CM

## 2020-01-26 LAB — TSH: TSH: 0.616 u[IU]/mL (ref 0.450–4.500)

## 2020-01-26 MED ORDER — ZOLPIDEM TARTRATE 10 MG PO TABS: ORAL_TABLET | ORAL | 1 refills | Status: DC

## 2020-01-26 MED ORDER — APIXABAN 5 MG PO TABS
5.0000 mg | ORAL_TABLET | Freq: Two times a day (BID) | ORAL | 5 refills | Status: DC
Start: 2020-01-26 — End: 2020-06-19

## 2020-01-26 MED ORDER — LORAZEPAM 2 MG PO TABS: 2.0000 mg | ORAL_TABLET | Freq: Four times a day (QID) | ORAL | 5 refills | Status: DC | PRN

## 2020-01-26 MED ORDER — OXYCODONE HCL 30 MG PO TABS: 30.0000 mg | ORAL_TABLET | ORAL | 0 refills | Status: AC | PRN

## 2020-01-26 NOTE — Progress Notes (Signed)
   Subjective:    Patient ID: Laurie Schaefer, female    DOB: 03/04/1956, 64 y.o.   MRN: [id number]  HPI Here to follow up on a hospital stay from 01-15-20 to 01-16-20 for a PE. She presented with a sharp chest pain, all over body pains, and SOB. A PE was found in the RUL in a branch of the right pulmonary artery. There was no evidence of right heart strain. She was started on Eliquis, and and now she is taking 5 mg BID. She is using continuous oxygen at 2-3 liters of flow. Today on 3 liters her O2 sat in 97%. She has no more chest pain. She asks if her Lorazepam can be increased to 4 a day because her anxiety has been a real problem. She is sleeping fairly well. She had stopped taking Progesterone about 6 months ago, and we will avoid any hormonal medications in the future.    Review of Systems  Constitutional: Negative.   Respiratory: Positive for shortness of breath. Negative for cough.   Cardiovascular: Negative.   Gastrointestinal: Negative.   Genitourinary: Negative.   Musculoskeletal: Positive for myalgias.  Neurological: Negative.        Objective:   Physical Exam Constitutional:      Appearance: Normal appearance. She is not ill-appearing.  Cardiovascular:     Rate and Rhythm: Normal rate and regular rhythm.     Pulses: Normal pulses.     Heart sounds: Normal heart sounds.  Pulmonary:     Effort: Pulmonary effort is normal. No respiratory distress.     Breath sounds: Normal breath sounds. No stridor. No wheezing, rhonchi or rales.  Neurological:     General: No focal deficit present.     Mental Status: She is alert and oriented to person, place, and time.           Assessment & Plan:  She is doing fairly well after getting a PE. She will remain on Eliquis for at least 6 months. We will plan on repeating a CTA of the chest in 6 months. She will wear the oxygen for the time being. She is back on Symbicort BID and she has her albuterol inhaler and her Xopenex  nebulizer if needed. We will increase the Lorazepam to 2 mg QID.  Gershon Crane, MD

## 2020-01-29 ENCOUNTER — Other Ambulatory Visit (INDEPENDENT_AMBULATORY_CARE_PROVIDER_SITE_OTHER): Payer: Medicare HMO

## 2020-01-29 DIAGNOSIS — R002 Palpitations: Secondary | ICD-10-CM

## 2020-02-04 DIAGNOSIS — J9611 Chronic respiratory failure with hypoxia: Secondary | ICD-10-CM | POA: Diagnosis not present

## 2020-02-05 DIAGNOSIS — J439 Emphysema, unspecified: Secondary | ICD-10-CM | POA: Diagnosis not present

## 2020-02-05 DIAGNOSIS — J301 Allergic rhinitis due to pollen: Secondary | ICD-10-CM | POA: Diagnosis not present

## 2020-02-05 DIAGNOSIS — F319 Bipolar disorder, unspecified: Secondary | ICD-10-CM | POA: Diagnosis not present

## 2020-02-05 DIAGNOSIS — J9611 Chronic respiratory failure with hypoxia: Secondary | ICD-10-CM | POA: Diagnosis not present

## 2020-02-08 NOTE — Progress Notes (Signed)
Key: [id number] - PA Case ID: [id number] Need help? Call us at [phone] Status Sent to [employer] oxyCODONE HCl 30MG  tablets Form [employer] Electronic PA Form

## 2020-02-12 ENCOUNTER — Telehealth: Payer: Self-pay | Admitting: Family Medicine

## 2020-02-12 NOTE — Telephone Encounter (Signed)
Pt said she was suppose to call the office for a pain care visit ? Pt would like a call

## 2020-02-12 NOTE — Telephone Encounter (Signed)
Does she need a visit now or her next PMV in 3 months?

## 2020-02-14 ENCOUNTER — Encounter: Payer: Self-pay | Admitting: Family Medicine

## 2020-02-14 ENCOUNTER — Telehealth (INDEPENDENT_AMBULATORY_CARE_PROVIDER_SITE_OTHER): Payer: Medicare HMO | Admitting: Family Medicine

## 2020-02-14 DIAGNOSIS — M797 Fibromyalgia: Secondary | ICD-10-CM | POA: Diagnosis not present

## 2020-02-14 DIAGNOSIS — F1190 Opioid use, unspecified, uncomplicated: Secondary | ICD-10-CM

## 2020-02-14 MED ORDER — OXYCODONE HCL 30 MG PO TABS: 30.0000 mg | ORAL_TABLET | Freq: Four times a day (QID) | ORAL | 0 refills | Status: AC | PRN

## 2020-02-14 MED ORDER — OXYCODONE HCL 30 MG PO TABS: 30.0000 mg | ORAL_TABLET | Freq: Four times a day (QID) | ORAL | 0 refills | Status: DC | PRN

## 2020-02-14 NOTE — Telephone Encounter (Signed)
She needs a PMV now

## 2020-02-14 NOTE — Progress Notes (Signed)
   Subjective:    Patient ID: Laurie Schaefer, female    DOB: Nov 21, 1955, 64 y.o.   MRN: [id number]  HPI Virtual Visit via Telephone Note  I connected with the patient on 02/14/20 at  2:45 PM EST by telephone and verified that I am speaking with the correct person using two identifiers.   I discussed the limitations, risks, security and privacy concerns of performing an evaluation and management service by telephone and the availability of in person appointments. I also discussed with the patient that there may be a patient responsible charge related to this service. The patient expressed understanding and agreed to proceed.  Location patient: home Location provider: work or home office Participants present for the call: patient, provider Patient did not have a visit in the prior 7 days to address this/these issue(s).   History of Present Illness: Here for pain management, she is doing well.  Indication for chronic opioid: fibromyalgia Medication and dose: Oxycodone 30 mg  # pills per month: 120 Last UDS date: 01-15-20 Opioid Treatment Agreement signed (Y/N): 01-15-20 Opioid Treatment Agreement last reviewed with patient:  02-14-20 [facility] reviewed this encounter (include red flags): Yes    Observations/Objective: Patient sounds cheerful and well on the phone. I do not appreciate any SOB. Speech and thought processing are grossly intact. Patient reported vitals:  Assessment and Plan: Pain management, meds were refilled.  Gershon Crane, MD   Follow Up Instructions:     [phone] 5-10 [phone] 11-20 9443 21-30 I did not refer this patient for an OV in the next 24 hours for this/these issue(s).  I discussed the assessment and treatment plan with the patient. The patient was provided an opportunity to ask questions and all were answered. The patient agreed with the plan and demonstrated an understanding of the instructions.   The patient was advised to call back or seek an  in-person evaluation if the symptoms worsen or if the condition fails to improve as anticipated.  I provided 14 minutes of non-face-to-face time during this encounter.   Gershon Crane, MD    Review of Systems     Objective:   Physical Exam        Assessment & Plan:

## 2020-02-14 NOTE — Telephone Encounter (Signed)
Pt has an appointment today

## 2020-02-15 ENCOUNTER — Telehealth: Payer: Medicare HMO | Admitting: Family Medicine

## 2020-02-26 DIAGNOSIS — R002 Palpitations: Secondary | ICD-10-CM | POA: Diagnosis not present

## 2020-03-05 DIAGNOSIS — J9611 Chronic respiratory failure with hypoxia: Secondary | ICD-10-CM | POA: Diagnosis not present

## 2020-03-06 ENCOUNTER — Other Ambulatory Visit: Payer: Self-pay | Admitting: Family Medicine

## 2020-03-06 DIAGNOSIS — J439 Emphysema, unspecified: Secondary | ICD-10-CM | POA: Diagnosis not present

## 2020-03-06 DIAGNOSIS — F319 Bipolar disorder, unspecified: Secondary | ICD-10-CM | POA: Diagnosis not present

## 2020-03-06 DIAGNOSIS — J301 Allergic rhinitis due to pollen: Secondary | ICD-10-CM | POA: Diagnosis not present

## 2020-03-06 DIAGNOSIS — J9611 Chronic respiratory failure with hypoxia: Secondary | ICD-10-CM | POA: Diagnosis not present

## 2020-03-11 IMAGING — DX DG CHEST 2V
2 series · 2 of 2 positions shown · non-contrast
Comparison: 04/01/2017

CLINICAL DATA: Cough, congestion and shortness of breath over the
last week.

EXAM:
CHEST - 2 VIEW

[chest pa]
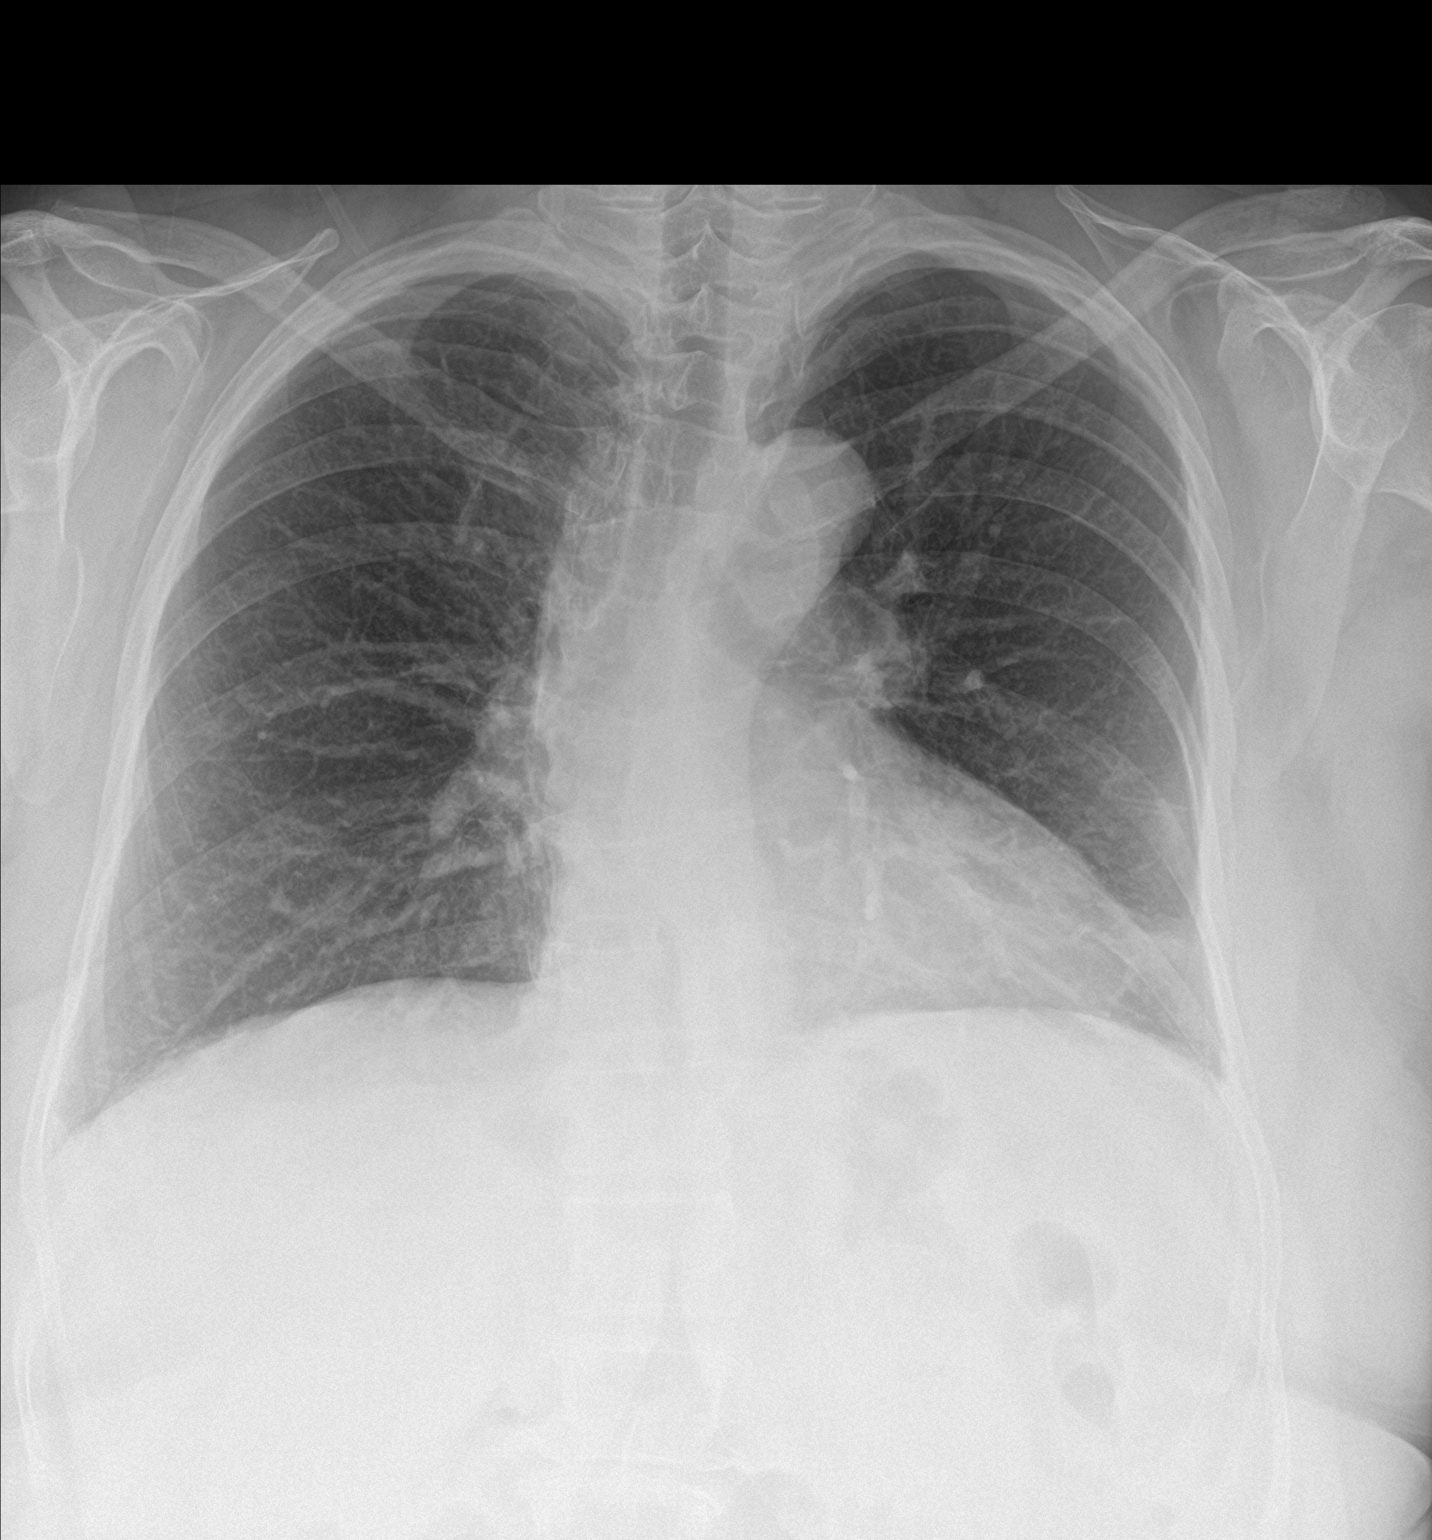

[chest lat]
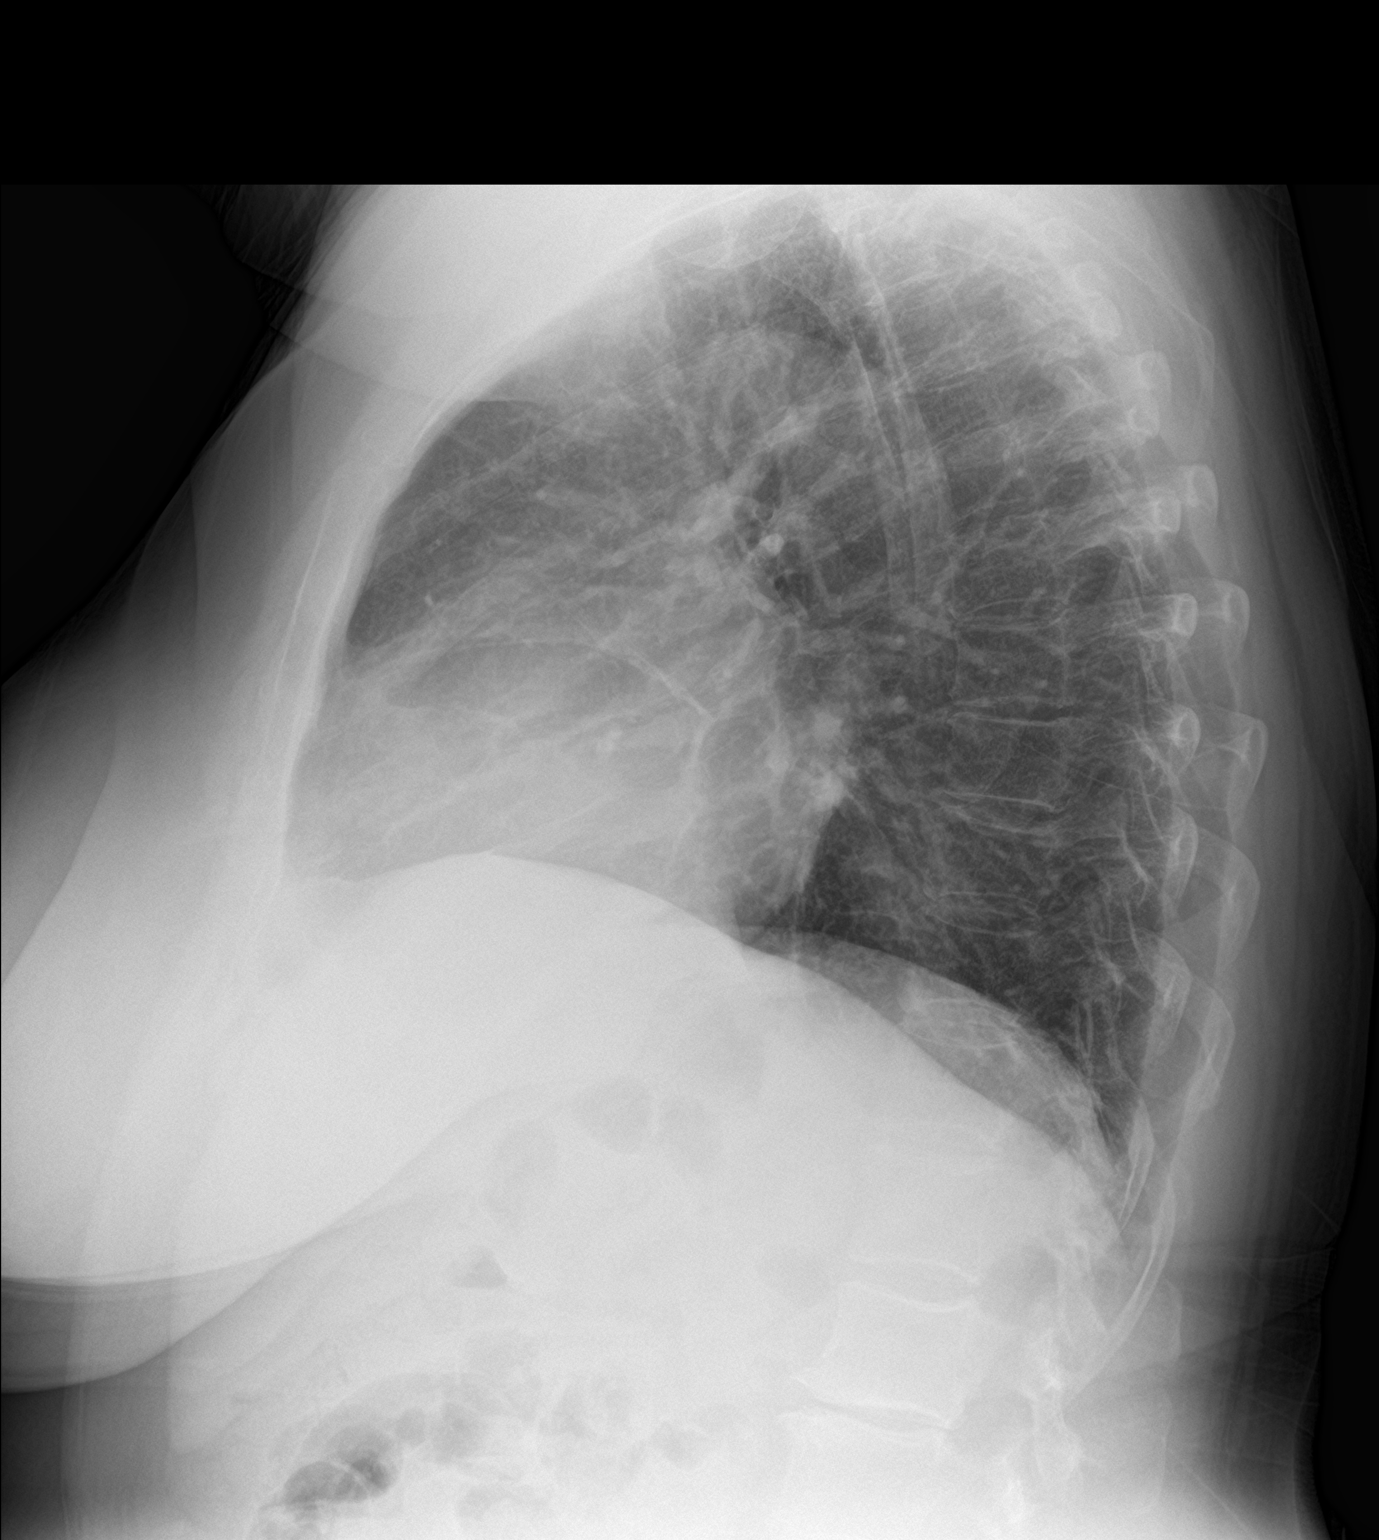

[2 of 2 positions shown; findings below may reference images not displayed]

FINDINGS: Heart size is normal. There is tortuosity of the aorta. The right
lung is clear. There is patchy infiltrate in the lingula. No dense
consolidation or collapse. No effusions. Bony structures
unremarkable except for mild spinal curvature.
IMPRESSION: Mild patchy lingular pneumonia.

## 2020-03-13 ENCOUNTER — Ambulatory Visit: Payer: Medicare HMO | Admitting: Family Medicine

## 2020-03-26 ENCOUNTER — Ambulatory Visit (INDEPENDENT_AMBULATORY_CARE_PROVIDER_SITE_OTHER): Payer: Medicare HMO | Admitting: Family Medicine

## 2020-03-26 ENCOUNTER — Encounter: Payer: Self-pay | Admitting: Family Medicine

## 2020-03-26 ENCOUNTER — Other Ambulatory Visit: Payer: Self-pay

## 2020-03-26 VITALS — BP 124/76 | HR 110 | Ht 65.0 in | Wt 209.2 lb

## 2020-03-26 DIAGNOSIS — R232 Flushing: Secondary | ICD-10-CM

## 2020-03-26 DIAGNOSIS — R252 Cramp and spasm: Secondary | ICD-10-CM | POA: Diagnosis not present

## 2020-03-26 MED ORDER — BUPROPION HCL ER (XL) 150 MG PO TB24: 150.0000 mg | ORAL_TABLET | Freq: Every day | ORAL | 5 refills | Status: DC

## 2020-03-26 MED ORDER — PREGABALIN 100 MG PO CAPS: 100.0000 mg | ORAL_CAPSULE | Freq: Every day | ORAL | 2 refills | Status: DC

## 2020-03-26 NOTE — Progress Notes (Signed)
   Subjective:    Patient ID: Laurie Schaefer, female    DOB: 21-Jul-1955, 65 y.o.   MRN: [id number]  HPI Here for several issues. First she struggles with arm and leg cramps at night. These have been going on about 18 months, but thye are worse lately. They often interfere with her sleep. She had tried Gabapentin for them and it helped, but this gave her headaches. Also she has a lot of sweats, similar to what she had when she went through menopause. She had taken Progesterone, but this was stopped in November when she had the PE.   Review of Systems  Constitutional: Positive for diaphoresis. Negative for fever.  Respiratory: Negative.   Cardiovascular: Negative.   Musculoskeletal: Positive for myalgias.       Objective:   Physical Exam Constitutional:      Appearance: Normal appearance.     Comments: Wearing [place] oxygen   Cardiovascular:     Rate and Rhythm: Normal rate and regular rhythm.     Pulses: Normal pulses.     Heart sounds: Normal heart sounds.  Pulmonary:     Effort: Pulmonary effort is normal.     Breath sounds: Normal breath sounds.  Neurological:     General: No focal deficit present.     Mental Status: She is alert. Mental status is at baseline.           Assessment & Plan:  For the nocturnal muscle cramps, she will try magnesium 400 mg OTC, to take 2 tablets at bedtime. She will also try Lyrica 100 mg at bedtime for a few weeks. For the sweats, she will try black cohosh OTC.  Gershon Crane, MD

## 2020-04-08 ENCOUNTER — Telehealth: Payer: Self-pay | Admitting: Family Medicine

## 2020-04-08 NOTE — Telephone Encounter (Signed)
Left message for patient to call back and schedule Medicare Annual Wellness Visit (AWV) either virtually or in office.   Last AWV 02/22/2019  please schedule at anytime with [facility] Nurse Health Advisor 1 or 2   This should be a 45 minute visit.

## 2020-04-24 ENCOUNTER — Encounter: Payer: Self-pay | Admitting: Family Medicine

## 2020-04-25 MED ORDER — ARIPIPRAZOLE 10 MG PO TABS: 10.0000 mg | ORAL_TABLET | Freq: Every day | ORAL | 2 refills | Status: DC

## 2020-04-25 NOTE — Telephone Encounter (Signed)
I sent in Ability 10 mg daily for her to try. Take this in addition to her current meds for now.  If it works, hopefully we can get off some of the other meds

## 2020-05-02 ENCOUNTER — Other Ambulatory Visit: Payer: Self-pay | Admitting: Family Medicine

## 2020-05-02 DIAGNOSIS — Z1231 Encounter for screening mammogram for malignant neoplasm of breast: Secondary | ICD-10-CM

## 2020-05-07 ENCOUNTER — Ambulatory Visit: Payer: Medicare HMO

## 2020-05-08 MED ORDER — LOSARTAN POTASSIUM-HCTZ 100-25 MG PO TABS: 1.0000 | ORAL_TABLET | Freq: Every day | ORAL | 3 refills | Status: DC

## 2020-05-08 NOTE — Telephone Encounter (Signed)
Aripiprazole is the generic name for Abilify. I did refill the Losartan HCT. For Oxycodone refills she will need a PMV

## 2020-05-09 ENCOUNTER — Telehealth: Payer: Self-pay | Admitting: Family Medicine

## 2020-05-09 NOTE — Telephone Encounter (Signed)
The patient called wanting to know if she can have a mammogram done with a blood clot and also does she need a screening mammogram or a diagnostic mammogram.  She said that they are holding 03/10 at 2 pm for her but if they don't hear from her today then the next appointment will be in May.  The patient would like to have a call back today.

## 2020-05-09 NOTE — Telephone Encounter (Signed)
Patient would like someone to call her regarding her mammogram appointment.  Please advise.

## 2020-05-09 NOTE — Telephone Encounter (Signed)
Please see previous message, has been sent to MD.

## 2020-05-10 NOTE — Telephone Encounter (Signed)
All she needs is a screening mammogram

## 2020-05-10 NOTE — Telephone Encounter (Signed)
Left a detailed message for pt regarding concerns of getting her mammogram done at her imaging appointment

## 2020-05-11 ENCOUNTER — Ambulatory Visit: Payer: Medicare HMO

## 2020-05-15 MED ORDER — PRAZOSIN HCL 2 MG PO CAPS: 2.0000 mg | ORAL_CAPSULE | Freq: Every day | ORAL | 3 refills | Status: AC

## 2020-05-15 NOTE — Telephone Encounter (Signed)
Done

## 2020-05-23 ENCOUNTER — Emergency Department (HOSPITAL_COMMUNITY)
Admission: EM | Admit: 2020-05-23 | Discharge: 2020-05-24 | Disposition: A | Discharge status: Home / Self Care | Payer: Medicare HMO | Attending: Emergency Medicine | Admitting: Emergency Medicine

## 2020-05-23 ENCOUNTER — Other Ambulatory Visit: Payer: Self-pay

## 2020-05-23 DIAGNOSIS — F319 Bipolar disorder, unspecified: Secondary | ICD-10-CM | POA: Diagnosis not present

## 2020-05-23 DIAGNOSIS — Z20822 Contact with and (suspected) exposure to covid-19: Secondary | ICD-10-CM | POA: Diagnosis not present

## 2020-05-23 DIAGNOSIS — F3010 Manic episode without psychotic symptoms, unspecified: Secondary | ICD-10-CM

## 2020-05-23 DIAGNOSIS — Z79899 Other long term (current) drug therapy: Secondary | ICD-10-CM | POA: Insufficient documentation

## 2020-05-23 DIAGNOSIS — R0902 Hypoxemia: Secondary | ICD-10-CM | POA: Diagnosis not present

## 2020-05-23 DIAGNOSIS — J449 Chronic obstructive pulmonary disease, unspecified: Secondary | ICD-10-CM | POA: Diagnosis not present

## 2020-05-23 DIAGNOSIS — W19XXXA Unspecified fall, initial encounter: Secondary | ICD-10-CM | POA: Diagnosis not present

## 2020-05-23 DIAGNOSIS — R0600 Dyspnea, unspecified: Secondary | ICD-10-CM | POA: Diagnosis present

## 2020-05-23 DIAGNOSIS — N390 Urinary tract infection, site not specified: Secondary | ICD-10-CM | POA: Diagnosis not present

## 2020-05-23 DIAGNOSIS — I10 Essential (primary) hypertension: Secondary | ICD-10-CM | POA: Insufficient documentation

## 2020-05-23 DIAGNOSIS — Z7901 Long term (current) use of anticoagulants: Secondary | ICD-10-CM | POA: Insufficient documentation

## 2020-05-23 DIAGNOSIS — Z7951 Long term (current) use of inhaled steroids: Secondary | ICD-10-CM | POA: Diagnosis not present

## 2020-05-23 DIAGNOSIS — R410 Disorientation, unspecified: Secondary | ICD-10-CM | POA: Diagnosis not present

## 2020-05-23 DIAGNOSIS — R4182 Altered mental status, unspecified: Secondary | ICD-10-CM | POA: Diagnosis not present

## 2020-05-23 DIAGNOSIS — R509 Fever, unspecified: Secondary | ICD-10-CM | POA: Diagnosis not present

## 2020-05-23 DIAGNOSIS — F309 Manic episode, unspecified: Secondary | ICD-10-CM | POA: Diagnosis not present

## 2020-05-23 DIAGNOSIS — R0609 Other forms of dyspnea: Secondary | ICD-10-CM | POA: Diagnosis present

## 2020-05-23 DIAGNOSIS — R319 Hematuria, unspecified: Secondary | ICD-10-CM | POA: Diagnosis not present

## 2020-05-24 ENCOUNTER — Other Ambulatory Visit: Payer: Self-pay

## 2020-05-24 ENCOUNTER — Emergency Department (HOSPITAL_COMMUNITY): Payer: Medicare HMO

## 2020-05-24 ENCOUNTER — Telehealth: Payer: Self-pay | Admitting: Family Medicine

## 2020-05-24 ENCOUNTER — Encounter (HOSPITAL_COMMUNITY): Payer: Self-pay

## 2020-05-24 DIAGNOSIS — R410 Disorientation, unspecified: Secondary | ICD-10-CM | POA: Diagnosis not present

## 2020-05-24 DIAGNOSIS — R509 Fever, unspecified: Secondary | ICD-10-CM | POA: Diagnosis not present

## 2020-05-24 DIAGNOSIS — R4182 Altered mental status, unspecified: Secondary | ICD-10-CM | POA: Diagnosis not present

## 2020-05-24 DIAGNOSIS — N390 Urinary tract infection, site not specified: Secondary | ICD-10-CM | POA: Diagnosis not present

## 2020-05-24 DIAGNOSIS — F309 Manic episode, unspecified: Secondary | ICD-10-CM | POA: Diagnosis not present

## 2020-05-24 DIAGNOSIS — I10 Essential (primary) hypertension: Secondary | ICD-10-CM | POA: Diagnosis not present

## 2020-05-24 DIAGNOSIS — F319 Bipolar disorder, unspecified: Secondary | ICD-10-CM | POA: Diagnosis not present

## 2020-05-24 LAB — RAPID URINE DRUG SCREEN, HOSP PERFORMED
Amphetamines: NOT DETECTED
Barbiturates: NOT DETECTED
Benzodiazepines: POSITIVE — AB
Cocaine: NOT DETECTED
Opiates: POSITIVE — AB
Tetrahydrocannabinol: NOT DETECTED

## 2020-05-24 LAB — CBC WITH DIFFERENTIAL/PLATELET
Abs Immature Granulocytes: 0.1 10*3/uL — ABNORMAL HIGH (ref 0.00–0.07)
Basophils Absolute: 0.1 10*3/uL (ref 0.0–0.1)
Basophils Relative: 0 %
Eosinophils Absolute: 0 10*3/uL (ref 0.0–0.5)
Eosinophils Relative: 0 %
HCT: 41.8 % (ref 36.0–46.0)
Hemoglobin: 13.5 g/dL (ref 12.0–15.0)
Immature Granulocytes: 1 %
Lymphocytes Relative: 11 %
Lymphs Abs: 1.6 10*3/uL (ref 0.7–4.0)
MCH: 28.5 pg (ref 26.0–34.0)
MCHC: 32.3 g/dL (ref 30.0–36.0)
MCV: 88.2 fL (ref 80.0–100.0)
Monocytes Absolute: 0.8 10*3/uL (ref 0.1–1.0)
Monocytes Relative: 5 %
Neutro Abs: 12.5 10*3/uL — ABNORMAL HIGH (ref 1.7–7.7)
Neutrophils Relative %: 83 %
Platelets: 290 10*3/uL (ref 150–400)
RBC: 4.74 MIL/uL (ref 3.87–5.11)
RDW: 13.1 % (ref 11.5–15.5)
WBC: 15 10*3/uL — ABNORMAL HIGH (ref 4.0–10.5)
nRBC: 0 % (ref 0.0–0.2)

## 2020-05-24 LAB — RESP PANEL BY RT-PCR (FLU A&B, COVID) ARPGX2
Influenza A by PCR: NEGATIVE
Influenza B by PCR: NEGATIVE
SARS Coronavirus 2 by RT PCR: NEGATIVE

## 2020-05-24 LAB — URINALYSIS, ROUTINE W REFLEX MICROSCOPIC
Bacteria, UA: NONE SEEN
Bilirubin Urine: NEGATIVE
Glucose, UA: NEGATIVE mg/dL
Ketones, ur: 5 mg/dL — AB
Nitrite: NEGATIVE
Protein, ur: NEGATIVE mg/dL
Specific Gravity, Urine: 1.018 (ref 1.005–1.030)
pH: 5 (ref 5.0–8.0)

## 2020-05-24 LAB — CK: Total CK: 1194 U/L — ABNORMAL HIGH (ref 38–234)

## 2020-05-24 LAB — BASIC METABOLIC PANEL
Anion gap: 11 (ref 5–15)
BUN: 14 mg/dL (ref 8–23)
CO2: 28 mmol/L (ref 22–32)
Calcium: 9.1 mg/dL (ref 8.9–10.3)
Chloride: 101 mmol/L (ref 98–111)
Creatinine, Ser: 0.91 mg/dL (ref 0.44–1.00)
GFR, Estimated: >60 mL/min (ref >60)
Glucose, Bld: 130 mg/dL — ABNORMAL HIGH (ref 70–99)
Potassium: 4.2 mmol/L (ref 3.5–5.1)
Sodium: 140 mmol/L (ref 135–145)

## 2020-05-24 MED ORDER — HYDROCHLOROTHIAZIDE 25 MG PO TABS
25.0000 mg | ORAL_TABLET | Freq: Every day | ORAL | Status: DC
  Filled 2020-05-24: qty 1

## 2020-05-24 MED ORDER — LOSARTAN POTASSIUM 25 MG PO TABS: 100.0000 mg | ORAL_TABLET | Freq: Every day | ORAL | Status: DC

## 2020-05-24 MED ORDER — AMLODIPINE BESYLATE 5 MG PO TABS: 5.0000 mg | ORAL_TABLET | Freq: Every day | ORAL | Status: DC

## 2020-05-24 MED ORDER — LORAZEPAM 1 MG PO TABS
2.0000 mg | ORAL_TABLET | Freq: Four times a day (QID) | ORAL | Status: DC | PRN
  Administered 2020-05-24: 2 mg via ORAL
  Filled 2020-05-24: qty 2

## 2020-05-24 MED ORDER — ARIPIPRAZOLE 10 MG PO TABS: 10.0000 mg | ORAL_TABLET | Freq: Every day | ORAL | Status: DC

## 2020-05-24 MED ORDER — ALBUTEROL SULFATE HFA 108 (90 BASE) MCG/ACT IN AERS
2.0000 | INHALATION_SPRAY | Freq: Four times a day (QID) | RESPIRATORY_TRACT | Status: DC | PRN
  Filled 2020-05-24: qty 6.7

## 2020-05-24 MED ORDER — SODIUM CHLORIDE 0.9 % IV SOLN
1.0000 g | Freq: Once | INTRAVENOUS | Status: AC
  Administered 2020-05-24: 1 g via INTRAVENOUS
  Filled 2020-05-24: qty 10

## 2020-05-24 MED ORDER — APIXABAN 5 MG PO TABS
5.0000 mg | ORAL_TABLET | Freq: Once | ORAL | Status: AC
  Administered 2020-05-24: 5 mg via ORAL
  Filled 2020-05-24: qty 1

## 2020-05-24 MED ORDER — CEPHALEXIN 500 MG PO CAPS: 500.0000 mg | ORAL_CAPSULE | Freq: Four times a day (QID) | ORAL | 0 refills | Status: DC

## 2020-05-24 MED ORDER — ACETAMINOPHEN 325 MG PO TABS
650.0000 mg | ORAL_TABLET | Freq: Once | ORAL | Status: AC
  Administered 2020-05-24: 650 mg via ORAL
  Filled 2020-05-24: qty 2

## 2020-05-24 MED ORDER — PREGABALIN 50 MG PO CAPS: 100.0000 mg | ORAL_CAPSULE | Freq: Every day | ORAL | Status: DC

## 2020-05-24 MED ORDER — PANTOPRAZOLE SODIUM 40 MG PO TBEC: 40.0000 mg | DELAYED_RELEASE_TABLET | Freq: Every day | ORAL | Status: DC

## 2020-05-24 MED ORDER — SODIUM CHLORIDE 0.9 % IV BOLUS
1000.0000 mL | Freq: Once | INTRAVENOUS | Status: AC
  Administered 2020-05-24: 1000 mL via INTRAVENOUS

## 2020-05-24 MED ORDER — PRAZOSIN HCL 1 MG PO CAPS: 2.0000 mg | ORAL_CAPSULE | Freq: Every day | ORAL | Status: DC

## 2020-05-24 MED ORDER — BUPROPION HCL ER (XL) 150 MG PO TB24: 150.0000 mg | ORAL_TABLET | Freq: Every day | ORAL | Status: DC

## 2020-05-24 MED ORDER — LOSARTAN POTASSIUM-HCTZ 100-25 MG PO TABS: 1.0000 | ORAL_TABLET | Freq: Every day | ORAL | Status: DC

## 2020-05-24 MED ORDER — QUETIAPINE FUMARATE 50 MG PO TABS: 50.0000 mg | ORAL_TABLET | Freq: Two times a day (BID) | ORAL | Status: DC

## 2020-05-24 MED ORDER — TRAZODONE HCL 100 MG PO TABS: 100.0000 mg | ORAL_TABLET | Freq: Every day | ORAL | Status: DC

## 2020-05-24 MED ORDER — APIXABAN 5 MG PO TABS
5.0000 mg | ORAL_TABLET | Freq: Two times a day (BID) | ORAL | Status: DC
  Filled 2020-05-24: qty 1

## 2020-05-24 NOTE — Telephone Encounter (Signed)
Please advise 

## 2020-05-24 NOTE — ED Notes (Signed)
Pt states " I am hyperventilating and I am on oxygen at home continuously." This nurse researched pts chart and have not found evidence of current home oxygen. This nurse checked pts vitals and pt is satting at 100% on room air rr 17 at this time. Will continue to monitor.

## 2020-05-24 NOTE — ED Provider Notes (Signed)
   Patient seen after prior ED provider.  Patient is cleared by psych for discharge.  Patient's significant other will be contacted for transport of the patient home.  Importance of close follow-up is stressed.  Strict return precautions given understood.   Wynetta Fines, MD 05/24/20 1025

## 2020-05-24 NOTE — ED Notes (Signed)
Breakfast try provided. Pt eating at this time.

## 2020-05-24 NOTE — ED Notes (Signed)
Came to check on pt, but she is no longer in restroom and has left the premises. Pt was already placed up for discharge. Pts husband called again but no answer at this time. Provider made aware

## 2020-05-24 NOTE — ED Notes (Signed)
Pt is continuously up and down out of bed. Pt instructed to stay in bed. Pt informed that we are unable to reach any family

## 2020-05-24 NOTE — ED Provider Notes (Addendum)
[facility]-EMERGENCY DEPT Provider Note: Laurie Dell, MD, FACEP  CSN: [id number] MRN: [id number] ARRIVAL: 05/23/20 at 2355 ROOM: RESA/RESA   CHIEF COMPLAINT  Altered Mental Status  Level 5 caveat: Altered mental status HISTORY OF PRESENT ILLNESS  05/24/20 12:13 AM Laurie Schaefer is a 65 y.o. female who, according to EMS, has a baseline abnormal mental status.  Her husband is her power of attorney.  Her husband called EMS after he was unable to get the bathroom door open because she was sitting on the ground leaning against it.  He states that she was there for hours but is unclear how he knew this.  The patient herself tells me she believes she got overheated/sweaty and sat down because she was going to pass out.  Her husband noticed blood in the toilet bowl but does not know where it came from (urine, stool, or other).  She is not complaining of any pain.  There is no obvious injury.  She was noted to be febrile on arrival with a temperature of 100.6.   Past Medical History:  Diagnosis Date  . Arthritis   . Bipolar disorder (HCC)   . Chronic neck pain    had previously seen Dr. Salli [facility] at [facility]   . COPD (chronic obstructive pulmonary disease) (HCC)    sees Dr. Delton Coombes   . Depression   . Fibromyalgia   . Headache   . Hypertension     Past Surgical History:  Procedure Laterality Date  . FRACTURE SURGERY      Family History  Problem Relation Age of Onset  . Depression Mother   . Hypertension Mother   . Stroke Father     Social History   Tobacco Use  . Smoking status: Never Smoker  . Smokeless tobacco: Never Used  Substance Use Topics  . Alcohol use: No    Alcohol/week: 0.0 standard drinks  . Drug use: No    Prior to Admission medications   Medication Sig Start Date End Date Taking? Authorizing Provider  albuterol (VENTOLIN HFA) 108 (90 Base) MCG/ACT inhaler INHALE 2 PUFFS INTO THE LUNGS EVERY 6 HOURS AS NEEDED FOR WHEEZING OR SHORTNESS OF  BREATH Patient taking differently: Inhale 2 puffs into the lungs every 6 (six) hours as needed for shortness of breath. 05/30/19   Parrett, Tammy S, NP  amLODipine (NORVASC) 5 MG tablet Take 1 tablet (5 mg total) by mouth daily. 02/13/19   Nelwyn Salisbury, MD  apixaban (ELIQUIS) 5 MG TABS tablet Take 1 tablet (5 mg total) by mouth 2 (two) times daily. 01/26/20   Nelwyn Salisbury, MD  ARIPiprazole (ABILIFY) 10 MG tablet Take 1 tablet (10 mg total) by mouth daily. 04/25/20   Nelwyn Salisbury, MD  budesonide-formoterol [facility]) 160-4.5 MCG/ACT inhaler Inhale 2 puffs into the lungs 2 (two) times daily. 05/30/19   Parrett, Virgel Bouquet, NP  buPROPion (WELLBUTRIN XL) 150 MG 24 hr tablet Take 1 tablet (150 mg total) by mouth daily. 03/26/20   Nelwyn Salisbury, MD  cholecalciferol (VITAMIN D) 1000 units tablet Take 1,000 Units by mouth daily.    [provider]  Fexofenadine HCl (MUCINEX ALLERGY PO) Take 1 tablet by mouth daily as needed (allergies/congestion.).     [provider]  fluticasone (FLONASE) 50 MCG/ACT nasal spray Place 2 sprays into both nostrils daily. Patient taking differently: Place 2 sprays into both nostrils daily as needed for allergies. 03/13/16   Leslye Peer, MD  levalbuterol (  XOPENEX) 0.63 MG/3ML nebulizer solution INHALE BY MOUTH VIA NEBULIZER EVERY 4 HOURS AS NEEDED FOR WHEEZE OR SHORTNESS OF BREATH Patient taking differently: Take 0.63 mg by nebulization every 6 (six) hours as needed for shortness of breath. 06/12/19   Parrett, Virgel Bouquet, NP  LORazepam (ATIVAN) 2 MG tablet Take 1 tablet (2 mg total) by mouth every 6 (six) hours as needed for anxiety. 01/26/20   Nelwyn Salisbury, MD  losartan-hydrochlorothiazide (HYZAAR) 100-25 MG tablet Take 1 tablet by mouth daily. 05/08/20   Nelwyn Salisbury, MD  methocarbamol (ROBAXIN) 500 MG tablet Take 1 tablet (500 mg total) by mouth every 8 (eight) hours as needed for muscle spasms. 01/16/20   Burnadette Pop, MD  OXYGEN 3lpm with sleep and  exertion if needed    [provider]  pantoprazole (PROTONIX) 40 MG tablet Take 1 tablet (40 mg total) by mouth daily. 07/12/19   Nelwyn Salisbury, MD  prazosin (MINIPRESS) 2 MG capsule Take 1 capsule (2 mg total) by mouth at bedtime. 05/15/20   Nelwyn Salisbury, MD  pregabalin (LYRICA) 100 MG capsule Take 1 capsule (100 mg total) by mouth at bedtime. 03/26/20   Nelwyn Salisbury, MD  QUEtiapine (SEROQUEL) 50 MG tablet Take 1 tablet (50 mg total) by mouth 2 (two) times daily. Patient taking differently: Take 50 mg by mouth 2 (two) times daily. Pt takes 1 tablet once a day. 07/12/19   Nelwyn Salisbury, MD  Tiotropium Bromide Monohydrate (SPIRIVA RESPIMAT) 2.5 MCG/ACT AERS Inhale 2 puffs into the lungs daily. 05/30/19   Parrett, Virgel Bouquet, NP  traZODone (DESYREL) 100 MG tablet TAKE 1 TABLET(100 MG) BY MOUTH AT BEDTIME 03/06/20   Nelwyn Salisbury, MD    Allergies Codeine   REVIEW OF SYSTEMS  Cannot assess due to altered mental status.   PHYSICAL EXAMINATION  Initial Vital Signs Blood pressure (!) 142/69, pulse (!) 101, temperature (!) 100.6 F (38.1 C), temperature source Oral, resp. rate 18, height 5\' 5"  (1.651 m), weight 94.9 kg, SpO2 90 %.  Examination General: Well-developed, well-nourished female in no acute distress; appearance consistent with age of record HENT: normocephalic; atraumatic Eyes: pupils equal, round and reactive to light; extraocular muscles grossly intact Neck: supple; nontender Heart: regular rate and rhythm Lungs: clear to auscultation bilaterally Abdomen: soft; nondistended; nontender; bowel sounds present Extremities: No deformity; full range of motion; pulses normal Neurologic: Awake, alert, confused; motor function intact in all extremities and symmetric; no facial droop Skin: Warm and dry Psychiatric: Mildly agitated   RESULTS  Summary of this visit's results, reviewed and interpreted by myself:   EKG Interpretation  Date/Time:    Ventricular Rate:    PR  Interval:    QRS Duration:   QT Interval:    QTC Calculation:   R Axis:     Text Interpretation:        Laboratory Studies: Results for orders placed or performed during the hospital encounter of 05/23/20 (from the past 24 hour(s))  CBC with Differential/Platelet     Status: Abnormal   Collection Time: 05/24/20 12:16 AM  Result Value Ref Range   WBC 15.0 (H) 4.0 - 10.5 K/uL   RBC 4.74 3.87 - 5.11 MIL/uL   Hemoglobin 13.5 12.0 - 15.0 g/dL   HCT 05/26/20 13.0 - 86.5 %   MCV 88.2 80.0 - 100.0 fL   MCH 28.5 26.0 - 34.0 pg   MCHC 32.3 30.0 - 36.0 g/dL   RDW 78.4 69.6 -  15.5 %   Platelets 290 150 - 400 K/uL   nRBC 0.0 0.0 - 0.2 %   Neutrophils Relative % 83 %   Neutro Abs 12.5 (H) 1.7 - 7.7 K/uL   Lymphocytes Relative 11 %   Lymphs Abs 1.6 0.7 - 4.0 K/uL   Monocytes Relative 5 %   Monocytes Absolute 0.8 0.1 - 1.0 K/uL   Eosinophils Relative 0 %   Eosinophils Absolute 0.0 0.0 - 0.5 K/uL   Basophils Relative 0 %   Basophils Absolute 0.1 0.0 - 0.1 K/uL   Immature Granulocytes 1 %   Abs Immature Granulocytes 0.10 (H) 0.00 - 0.07 K/uL  Basic metabolic panel     Status: Abnormal   Collection Time: 05/24/20 12:16 AM  Result Value Ref Range   Sodium 140 135 - 145 mmol/L   Potassium 4.2 3.5 - 5.1 mmol/L   Chloride 101 98 - 111 mmol/L   CO2 28 22 - 32 mmol/L   Glucose, Bld 130 (H) 70 - 99 mg/dL   BUN 14 8 - 23 mg/dL   Creatinine, Ser 3.53 0.44 - 1.00 mg/dL   Calcium 9.1 8.9 - 61.4 mg/dL   GFR, Estimated >43 >15 mL/min   Anion gap 11 5 - 15  CK     Status: Abnormal   Collection Time: 05/24/20 12:16 AM  Result Value Ref Range   Total CK 1,194 (H) 38 - 234 U/L  Urinalysis, Routine w reflex microscopic     Status: Abnormal   Collection Time: 05/24/20 12:16 AM  Result Value Ref Range   Color, Urine YELLOW YELLOW   APPearance TURBID (A) CLEAR   Specific Gravity, Urine 1.018 1.005 - 1.030   pH 5.0 5.0 - 8.0   Glucose, UA NEGATIVE NEGATIVE mg/dL   Hgb urine dipstick MODERATE (A)  NEGATIVE   Bilirubin Urine NEGATIVE NEGATIVE   Ketones, ur 5 (A) NEGATIVE mg/dL   Protein, ur NEGATIVE NEGATIVE mg/dL   Nitrite NEGATIVE NEGATIVE   Leukocytes,Ua LARGE (A) NEGATIVE   RBC / HPF 0-5 0 - 5 RBC/hpf   WBC, UA 6-10 0 - 5 WBC/hpf   Bacteria, UA NONE SEEN NONE SEEN   Squamous Epithelial / LPF 0-5 0 - 5   Amorphous Crystal PRESENT    Imaging Studies: DG Chest 2 View  Result Date: 05/24/2020 CLINICAL DATA:  Fever with altered mental status EXAM: CHEST - 2 VIEW COMPARISON:  12/09/2018 FINDINGS: Low lung volumes. No focal consolidation or effusion. Stable cardiomediastinal silhouette. No pneumothorax. Mild scoliosis of the spine IMPRESSION: No active cardiopulmonary disease. Electronically Signed   By: Jasmine Pang M.D.   On: 05/24/2020 00:44   CT Head Wo Contrast  Result Date: 05/24/2020 CLINICAL DATA:  Delirium. Hypertension. Bipolar disorder. Passed out found on floor. EXAM: CT HEAD WITHOUT CONTRAST TECHNIQUE: Contiguous axial images were obtained from the base of the skull through the vertex without intravenous contrast. COMPARISON:  Ct head 01/15/20 FINDINGS: Brain: No evidence of large-territorial acute infarction. No parenchymal hemorrhage. No mass lesion. No extra-axial collection. No mass effect or midline shift. No hydrocephalus. Basilar cisterns are patent. Vascular: No hyperdense vessel. Skull: No acute fracture or focal lesion. Likely surgical similar-appearing and metallic densities along the temporomandibular joints. Severe degenerative changes of the TMJ on the right. Sinuses/Orbits: Paranasal sinuses and mastoid air cells are clear. The orbits are unremarkable. Other: None. IMPRESSION: No acute intracranial abnormality. Electronically Signed   By: Tish Frederickson M.D.   On: 05/24/2020 05:26  ED COURSE and MDM  Nursing notes, initial and subsequent vitals signs, including pulse oximetry, reviewed and interpreted by myself.  Vitals:   05/24/20 0400 05/24/20 0430  05/24/20 0530 05/24/20 0633  BP: 128/70 (!) 118/58 (!) 121/52 (!) 175/77  Pulse: (!) 102 69 73 98  Resp: 18 13 15  (!) 24  Temp:      TempSrc:      SpO2: 94% 94% 97% 99%  Weight:      Height:       Medications  amLODipine (NORVASC) tablet 5 mg (has no administration in time range)  apixaban (ELIQUIS) tablet 5 mg (has no administration in time range)  ARIPiprazole (ABILIFY) tablet 10 mg (has no administration in time range)  buPROPion (WELLBUTRIN XL) 24 hr tablet 150 mg (has no administration in time range)  LORazepam (ATIVAN) tablet 2 mg (has no administration in time range)  losartan-hydrochlorothiazide (HYZAAR) 100-25 MG per tablet 1 tablet (has no administration in time range)  pantoprazole (PROTONIX) EC tablet 40 mg (has no administration in time range)  prazosin (MINIPRESS) capsule 2 mg (has no administration in time range)  pregabalin (LYRICA) capsule 100 mg (has no administration in time range)  QUEtiapine (SEROQUEL) tablet 50 mg (has no administration in time range)  traZODone (DESYREL) tablet 100 mg (has no administration in time range)  albuterol (VENTOLIN HFA) 108 (90 Base) MCG/ACT inhaler 2 puff (has no administration in time range)  acetaminophen (TYLENOL) tablet 650 mg (650 mg Oral Given 05/24/20 0335)  cefTRIAXone (ROCEPHIN) 1 g in sodium chloride 0.9 % 100 mL IVPB (0 g Intravenous Stopped 05/24/20 0422)  sodium chloride 0.9 % bolus 1,000 mL (0 mLs Intravenous Stopped 05/24/20 0613)  apixaban (ELIQUIS) tablet 5 mg (5 mg Oral Given 05/24/20 0630)   3:24 AM The patient's urinalysis is suggestive of an early urinary tract infection.  This is bolstered by her fever, confusion and suspected hematuria.  We will go ahead and treat.   4:29 AM McMahon score 2 indicating low rhabdomyolysis risk.  Will hydrate.  5:33 AM Patient requesting to be discharged home.  Her mood is labile and her speech is sometimes coherent and sometimes disorganized.  It is unclear what her baseline  mental status is that we cannot get a hold of any family members or friends.  I do not see an acute need for medical admission at this time.  I suspect this represents an exacerbation of her bipolar disorder, perhaps exacerbated by an early urinary tract infection..  We will have TTS assess her.  She has been able to ambulate without difficulty.   PROCEDURES  Procedures   ED DIAGNOSES     ICD-10-CM   1. Manic behavior (HCC)  F30.10   2. Lower urinary tract infectious disease  N39.0        Jahking Lesser, MD 05/24/20 0654    Paula LibraMolpus, Yamna Mackel, MD 05/24/20 [phone]

## 2020-05-24 NOTE — ED Notes (Signed)
Notified patient of 2 unsuccessful calls to significant other, pt states her sister lives close but patient is unable to provide phone number. With patient's permission, this RN assisted in going through contacts on patients phone to try to find number. Patient's phone has no saved contacts and patient frequently changing which number she is trying to give.

## 2020-05-24 NOTE — Consult Note (Signed)
Telepsych Consultation   Reason for Consult: Telepsychiatry assessment Referring Physician:  Dr Rodena MedinMessick Location of Patient: [facility] emergency department Location of Provider: Behavioral Health TTS Department  Patient Identification: Laurie Schaefer MRN:  [id number] Principal Diagnosis: DOE (dyspnea on exertion) Diagnosis:  Principal Problem:   DOE (dyspnea on exertion)   Total Time spent with patient: 30 minutes  Subjective:   Laurie Schaefer is a 65 y.o. female patient.  Patient states "I am ready to go home, I need to go home so that I can get some rest."  HPI:   Patient reports she came to the hospital last night after she became "overheated in the shower and passed out."  Patient reports her "mate" overreacted related to the fact that she has recently suffered multiple medical concerns.  Patient reports she passed out behind the door and her significant other was unable to open the door.  Patient believes significant other became overly concerned and called for emergency assistance.  Patient alert and oriented, actively participates in assessment.  Patient noted to state "when I talk it is hard for me to catch my breath."  Patient does not appear short of breath and continues conversation, repeats "I need a minute to catch my breath."  Patient pleasant cooperative during assessment.  Patient denies suicidal and homicidal ideations.  Patient denies any history of suicide attempts, denies any history of self-harm behaviors.  Patient denies auditory and visual hallucinations.  Patient denies symptoms of paranoia.  There is no evidence of delusional thought content and no indication that patient is responding to internal stimuli.  Patient has been diagnosed with depression as well as bipolar 1 disorder.  Patient reports she is followed outpatient by primary care provider, Dr Laurie Schaefer.  Patient reports she is "well satisfied" with outpatient provider.  Patient does not report  being seen by outpatient counseling currently.  Patient reports compliance with current medications including Seroquel, Lyrica, Minipress, Wellbutrin and Abilify.  Patient resides in [place] with her significant other.  Patient denies access to weapons.  Patient endorses average appetite and poor sleep at night. Frequent naps during day to compensate for poor quality of sleep at night.  Patient reports she suffers from insomnia.  Patient denies alcohol and substance use.  Patient offered support and encouragement.  Patient gives verbal consent to speak with her significant other, Laurie Schaefer. TTS counselor spoke with significant other who denies concern for patient safety. Laurie Schaefer reports patient was overheated while bathing last evening.     Past Psychiatric History: Depression, bipolar 1 disorder  Risk to Self:  Denies Risk to Others:  Denies Prior Inpatient Therapy:   none reported Prior Outpatient Therapy:   Currently followed by Primary Care at patient's request  Past Medical History:  Past Medical History:  Diagnosis Date  . Arthritis   . Bipolar disorder (HCC)   . Chronic neck pain    had previously seen Dr. Salli RealYun Sun [facility]   . COPD (chronic obstructive pulmonary disease) (HCC)    sees Dr. Delton CoombesByrum   . Depression   . Fibromyalgia   . Headache   . Hypertension     Past Surgical History:  Procedure Laterality Date  . FRACTURE SURGERY     Family History:  Family History  Problem Relation Age of Onset  . Depression Mother   . Hypertension Mother   . Stroke Father    Family Psychiatric  History: None reported Social History:  Social History  Substance and Sexual Activity  Alcohol Use No  . Alcohol/week: 0.0 standard drinks     Social History   Substance and Sexual Activity  Drug Use No    Social History   Socioeconomic History  . Marital status: Single    Spouse name: Not on file  . Number of children: 0  . Years of education: Not on file   . Highest education level: Not on file  Occupational History  . Not on file  Tobacco Use  . Smoking status: Never Smoker  . Smokeless tobacco: Never Used  Substance and Sexual Activity  . Alcohol use: No    Alcohol/week: 0.0 standard drinks  . Drug use: No  . Sexual activity: Not on file  Other Topics Concern  . Not on file  Social History Narrative   Divorced, HH 1   No children   States she's been to graduate school    Social Determinants of Health   Financial Resource Strain: Not on file  Food Insecurity: Not on file  Transportation Needs: Not on file  Physical Activity: Not on file  Stress: Not on file  Social Connections: Not on file   Additional Social History:    Allergies:   Allergies  Allergen Reactions  . Codeine Nausea Only    Labs:  Results for orders placed or performed during the hospital encounter of 05/23/20 (from the past 48 hour(s))  CBC with Differential/Platelet     Status: Abnormal   Collection Time: 05/24/20 12:16 AM  Result Value Ref Range   WBC 15.0 (H) 4.0 - 10.5 K/uL   RBC 4.74 3.87 - 5.11 MIL/uL   Hemoglobin 13.5 12.0 - 15.0 g/dL   HCT 26.8 34.1 - 96.2 %   MCV 88.2 80.0 - 100.0 fL   MCH 28.5 26.0 - 34.0 pg   MCHC 32.3 30.0 - 36.0 g/dL   RDW 22.9 79.8 - 92.1 %   Platelets 290 150 - 400 K/uL   nRBC 0.0 0.0 - 0.2 %   Neutrophils Relative % 83 %   Neutro Abs 12.5 (H) 1.7 - 7.7 K/uL   Lymphocytes Relative 11 %   Lymphs Abs 1.6 0.7 - 4.0 K/uL   Monocytes Relative 5 %   Monocytes Absolute 0.8 0.1 - 1.0 K/uL   Eosinophils Relative 0 %   Eosinophils Absolute 0.0 0.0 - 0.5 K/uL   Basophils Relative 0 %   Basophils Absolute 0.1 0.0 - 0.1 K/uL   Immature Granulocytes 1 %   Abs Immature Granulocytes 0.10 (H) 0.00 - 0.07 K/uL    Comment: Performed at [facility], [address], [place], [place] [address]  Basic metabolic panel     Status: Abnormal   Collection Time: 05/24/20 12:16 AM  Result Value Ref Range    Sodium 140 135 - 145 mmol/L   Potassium 4.2 3.5 - 5.1 mmol/L   Chloride 101 98 - 111 mmol/L   CO2 28 22 - 32 mmol/L   Glucose, Bld 130 (H) 70 - 99 mg/dL    Comment: Glucose reference range applies only to samples taken after fasting for at least 8 hours.   BUN 14 8 - 23 mg/dL   Creatinine, Ser 4.08 0.44 - 1.00 mg/dL   Calcium 9.1 8.9 - 14.4 mg/dL   GFR, Estimated >81 >85 mL/min    Comment: (NOTE) Calculated using the CKD-EPI Creatinine Equation (2021)    Anion gap 11 5 - 15    Comment: Performed at  [facility], [address], [place], [place] [address]  CK     Status: Abnormal   Collection Time: 05/24/20 12:16 AM  Result Value Ref Range   Total CK 1,194 (H) 38 - 234 U/L    Comment: Performed at [facility], [address], [place], [place] [address]  Urinalysis, Routine w reflex microscopic     Status: Abnormal   Collection Time: 05/24/20 12:16 AM  Result Value Ref Range   Color, Urine YELLOW YELLOW   APPearance TURBID (A) CLEAR   Specific Gravity, Urine 1.018 1.005 - 1.030   pH 5.0 5.0 - 8.0   Glucose, UA NEGATIVE NEGATIVE mg/dL   Hgb urine dipstick MODERATE (A) NEGATIVE   Bilirubin Urine NEGATIVE NEGATIVE   Ketones, ur 5 (A) NEGATIVE mg/dL   Protein, ur NEGATIVE NEGATIVE mg/dL   Nitrite NEGATIVE NEGATIVE   Leukocytes,Ua LARGE (A) NEGATIVE   RBC / HPF 0-5 0 - 5 RBC/hpf   WBC, UA 6-10 0 - 5 WBC/hpf   Bacteria, UA NONE SEEN NONE SEEN   Squamous Epithelial / LPF 0-5 0 - 5   Amorphous Crystal PRESENT     Comment: Performed at [facility], [address] [place], [place] [address]  Rapid urine drug screen (hospital performed)     Status: Abnormal   Collection Time: 05/24/20 12:16 AM  Result Value Ref Range   Opiates POSITIVE (A) NONE DETECTED   Cocaine NONE DETECTED NONE DETECTED   Benzodiazepines POSITIVE (A) NONE DETECTED   Amphetamines NONE DETECTED NONE DETECTED   Tetrahydrocannabinol NONE DETECTED NONE  DETECTED   Barbiturates NONE DETECTED NONE DETECTED    Comment: (NOTE) DRUG SCREEN FOR MEDICAL PURPOSES ONLY.  IF CONFIRMATION IS NEEDED FOR ANY PURPOSE, NOTIFY LAB WITHIN 5 DAYS.  LOWEST DETECTABLE LIMITS FOR URINE DRUG SCREEN Drug Class                     Cutoff (ng/mL) Amphetamine and metabolites    1000 Barbiturate and metabolites    200 Benzodiazepine                 200 Tricyclics and metabolites     300 Opiates and metabolites        300 Cocaine and metabolites        300 THC                            50 Performed at [facility], [address] [place], [place] [address]   Resp Panel by RT-PCR (Flu A&B, Covid) Nasopharyngeal Swab     Status: None   Collection Time: 05/24/20  7:05 AM   Specimen: Nasopharyngeal Swab; Nasopharyngeal(NP) swabs in vial transport medium  Result Value Ref Range   SARS Coronavirus 2 by RT PCR NEGATIVE NEGATIVE    Comment: (NOTE) SARS-CoV-2 target nucleic acids are NOT DETECTED.  The SARS-CoV-2 RNA is generally detectable in upper respiratory specimens during the acute phase of infection. The lowest concentration of SARS-CoV-2 viral copies this assay can detect is 138 copies/mL. A negative result does not preclude SARS-Cov-2 infection and should not be used as the sole basis for treatment or other patient management decisions. A negative result may occur with  improper specimen collection/handling, submission of specimen other than nasopharyngeal swab, presence of viral mutation(s) within the areas targeted by this assay, and inadequate number of viral copies(<138 copies/mL). A negative  result must be combined with clinical observations, patient history, and epidemiological information. The expected result is Negative.  Fact Sheet for Patients:  BloggerCourse.com  Fact Sheet for Healthcare Providers:  SeriousBroker.it  This test is no t yet approved or cleared by the  [place] FDA and  has been authorized for detection and/or diagnosis of SARS-CoV-2 by FDA under an Emergency Use Authorization (EUA). This EUA will remain  in effect (meaning this test can be used) for the duration of the COVID-19 declaration under Section 564(b)(1) of the Act, 21 U.S.C.section 360bbb-3(b)(1), unless the authorization is terminated  or revoked sooner.       Influenza A by PCR NEGATIVE NEGATIVE   Influenza B by PCR NEGATIVE NEGATIVE    Comment: (NOTE) The Xpert Xpress SARS-CoV-2/FLU/RSV plus assay is intended as an aid in the diagnosis of influenza from Nasopharyngeal swab specimens and should not be used as a sole basis for treatment. Nasal washings and aspirates are unacceptable for Xpert Xpress SARS-CoV-2/FLU/RSV testing.  Fact Sheet for Patients: BloggerCourse.com  Fact Sheet for Healthcare Providers: SeriousBroker.it  This test is not yet approved or cleared by the [place] FDA and has been authorized for detection and/or diagnosis of SARS-CoV-2 by FDA under an Emergency Use Authorization (EUA). This EUA will remain in effect (meaning this test can be used) for the duration of the COVID-19 declaration under Section 564(b)(1) of the Act, 21 U.S.C. section 360bbb-3(b)(1), unless the authorization is terminated or revoked.  Performed at [facility], [address], [place], [place] [address]     Medications:  Current Facility-Administered Medications  Medication Dose Route Frequency Provider Last Rate Last Admin  . albuterol (VENTOLIN HFA) 108 (90 Base) MCG/ACT inhaler 2 puff  2 puff Inhalation Q6H PRN Molpus, John, MD      . amLODipine (NORVASC) tablet 5 mg  5 mg Oral Daily Molpus, John, MD      . apixaban (ELIQUIS) tablet 5 mg  5 mg Oral BID Molpus, John, MD      . ARIPiprazole (ABILIFY) tablet 10 mg  10 mg Oral Daily Molpus, John, MD      . buPROPion (WELLBUTRIN XL) 24 hr  tablet 150 mg  150 mg Oral Daily Molpus, John, MD      . losartan (COZAAR) tablet 100 mg  100 mg Oral Daily Molpus, John, MD       And  . hydrochlorothiazide (HYDRODIURIL) tablet 25 mg  25 mg Oral Daily Molpus, John, MD      . LORazepam (ATIVAN) tablet 2 mg  2 mg Oral Q6H PRN Molpus, John, MD   2 mg at 05/24/20 0705  . pantoprazole (PROTONIX) EC tablet 40 mg  40 mg Oral Daily Molpus, John, MD      . prazosin (MINIPRESS) capsule 2 mg  2 mg Oral QHS Molpus, John, MD      . pregabalin (LYRICA) capsule 100 mg  100 mg Oral QHS Molpus, John, MD      . QUEtiapine (SEROQUEL) tablet 50 mg  50 mg Oral BID Molpus, John, MD      . traZODone (DESYREL) tablet 100 mg  100 mg Oral QHS Molpus, John, MD       Current Outpatient Medications  Medication Sig Dispense Refill  . apixaban (ELIQUIS) 5 MG TABS tablet Take 1 tablet (5 mg total) by mouth 2 (two) times daily. 60 tablet 5  . albuterol (VENTOLIN HFA) 108 (90 Base) MCG/ACT inhaler INHALE 2 PUFFS INTO THE LUNGS  EVERY 6 HOURS AS NEEDED FOR WHEEZING OR SHORTNESS OF BREATH (Patient taking differently: Inhale 2 puffs into the lungs every 6 (six) hours as needed for shortness of breath.) 8.5 g 3  . amLODipine (NORVASC) 5 MG tablet Take 1 tablet (5 mg total) by mouth daily. 90 tablet 3  . ARIPiprazole (ABILIFY) 10 MG tablet Take 1 tablet (10 mg total) by mouth daily. 30 tablet 2  . budesonide-formoterol (SYMBICORT) 160-4.5 MCG/ACT inhaler Inhale 2 puffs into the lungs 2 (two) times daily. 3 Inhaler 1  . buPROPion (WELLBUTRIN XL) 150 MG 24 hr tablet Take 1 tablet (150 mg total) by mouth daily. 30 tablet 5  . cholecalciferol (VITAMIN D) 1000 units tablet Take 1,000 Units by mouth daily.    Marland Kitchen Fexofenadine HCl (MUCINEX ALLERGY PO) Take 1 tablet by mouth daily as needed (allergies/congestion.).     Marland Kitchen fluticasone (FLONASE) 50 MCG/ACT nasal spray Place 2 sprays into both nostrils daily. (Patient taking differently: Place 2 sprays into both nostrils daily as needed for  allergies.) 16 g 5  . levalbuterol (XOPENEX) 0.63 MG/3ML nebulizer solution INHALE BY MOUTH VIA NEBULIZER EVERY 4 HOURS AS NEEDED FOR WHEEZE OR SHORTNESS OF BREATH (Patient taking differently: Take 0.63 mg by nebulization every 6 (six) hours as needed for shortness of breath.) 300 mL 2  . LORazepam (ATIVAN) 2 MG tablet Take 1 tablet (2 mg total) by mouth every 6 (six) hours as needed for anxiety. 120 tablet 5  . losartan-hydrochlorothiazide (HYZAAR) 100-25 MG tablet Take 1 tablet by mouth daily. 90 tablet 3  . methocarbamol (ROBAXIN) 500 MG tablet Take 1 tablet (500 mg total) by mouth every 8 (eight) hours as needed for muscle spasms. 20 tablet 0  . OXYGEN 3lpm with sleep and exertion if needed    . pantoprazole (PROTONIX) 40 MG tablet Take 1 tablet (40 mg total) by mouth daily. 90 tablet 3  . prazosin (MINIPRESS) 2 MG capsule Take 1 capsule (2 mg total) by mouth at bedtime. 90 capsule 3  . pregabalin (LYRICA) 100 MG capsule Take 1 capsule (100 mg total) by mouth at bedtime. 30 capsule 2  . QUEtiapine (SEROQUEL) 50 MG tablet Take 1 tablet (50 mg total) by mouth 2 (two) times daily. (Patient taking differently: Take 50 mg by mouth 2 (two) times daily. Pt takes 1 tablet once a day.) 180 tablet 3  . Tiotropium Bromide Monohydrate (SPIRIVA RESPIMAT) 2.5 MCG/ACT AERS Inhale 2 puffs into the lungs daily. 12 g 1  . traZODone (DESYREL) 100 MG tablet TAKE 1 TABLET(100 MG) BY MOUTH AT BEDTIME 30 tablet 11    Musculoskeletal: Strength & Muscle Tone: within normal limits Gait & Station: normal Patient leans: N/A  Psychiatric Specialty Exam: Physical Exam Vitals and nursing note reviewed.  Constitutional:      Appearance: She is well-developed.  HENT:     Head: Normocephalic.  Cardiovascular:     Rate and Rhythm: Normal rate.  Pulmonary:     Effort: Pulmonary effort is normal.  Neurological:     Mental Status: She is alert and oriented to person, place, and time.  Psychiatric:         Attention and Perception: Attention and perception normal.        Mood and Affect: Mood is anxious. Affect is labile.        Speech: Speech normal.        Behavior: Behavior is hyperactive. Behavior is cooperative.        Thought Content:  Thought content normal.        Cognition and Memory: Cognition and memory normal.        Judgment: Judgment normal.     Review of Systems  Constitutional: Negative.   HENT: Negative.   Eyes: Negative.   Respiratory: Negative.   Cardiovascular: Negative.   Gastrointestinal: Negative.   Genitourinary: Negative.   Musculoskeletal: Negative.   Skin: Negative.   Neurological: Negative.   Psychiatric/Behavioral: The patient is nervous/anxious and is hyperactive.     Blood pressure (!) 175/77, pulse 98, temperature 99 F (37.2 C), temperature source Oral, resp. rate (!) 24, height  (1.651 m), weight 94.9 kg, SpO2 99 %.Body mass index is 34.82 kg/m.  General Appearance: Casual and Fairly Groomed  Eye Contact:  Good  Speech:  Clear and Coherent and Normal Rate  Volume:  Normal  Mood:  Anxious  Affect:  Congruent  Thought Process:  Coherent, Goal Directed and Descriptions of Associations: Intact  Orientation:  Full (Time, Place, and Person)  Thought Content:  WDL and Logical  Suicidal Thoughts:  No  Homicidal Thoughts:  No  Memory:  Immediate;   Fair Recent;   Fair Remote;   Fair  Judgement:  Fair  Insight:  Fair  Psychomotor Activity:  Restlessness  Concentration:  Concentration: Fair and Attention Span: Fair  Recall:  Good  Fund of Knowledge:  Good  Language:  Good  Akathisia:  No  Handed:  Right  AIMS (if indicated):     Assets:  Communication Skills Desire for Improvement Financial Resources/Insurance Housing Intimacy Leisure Time Physical Health Resilience Social Support Talents/Skills Transportation  ADL's:  Intact  Cognition:  WNL  Sleep:        Treatment Plan Summary: Plan Patient cleared by psychiatry.   Follow-up with outpatient psychiatry, resources provided. Patient reviewed with Dr. Bronwen Betters.  Disposition: No evidence of imminent risk to self or others at present.   Patient does not meet criteria for psychiatric inpatient admission. Supportive therapy provided about ongoing stressors. Discussed crisis plan, support from social network, calling 911, coming to the Emergency Department, and calling Suicide Hotline.  This service was provided via telemedicine using a 2-way, interactive audio and video technology.  Names of all persons participating in this telemedicine service and their role in this encounter. Name: Laurie Schaefer Role: Patient  Name: Berneice Heinrich Role: FNP  Name: Dr. Bronwen Betters Role: Psychiatry    Patrcia Dolly, FNP 05/24/2020 10:11 AM

## 2020-05-24 NOTE — ED Notes (Signed)
Called patient's significant other, no answer. Left a voicemail asking for a call back.

## 2020-05-24 NOTE — Telephone Encounter (Signed)
Just let them take care of her

## 2020-05-24 NOTE — ED Notes (Signed)
Patient unable to tolerate CT scan. Dr. Read Drivers notified.

## 2020-05-24 NOTE — ED Notes (Signed)
Patient transported to CT 

## 2020-05-24 NOTE — ED Notes (Signed)
Lab called to add on urine drug screen.  °

## 2020-05-24 NOTE — Telephone Encounter (Signed)
Laurie Schaefer called to let Dr. Clent Ridges know that the patient is in the psych ward at [facility].  He called EMS and it took them over an hour to get her in the ambulance.  He said that they are trying to inject her with Abilify.  He wants to know does he go get her or what does he do? He afraid to go get her actually.  Please advise

## 2020-05-24 NOTE — ED Triage Notes (Signed)
Patient BIB [facility] from home. Baseline altered mental status, according to husband. Husband called EMS out because patient was on the ground in the bathroom for several hours with her back against the door so the husband could not come in. Husband said that there was blood in her urine, which is why he called her out.

## 2020-05-24 NOTE — Discharge Instructions (Signed)
Return for any problem.  For your behavioral health needs, you are advised to follow up with an outpatient psychiatrist.  Contact one of the following providers at your earliest opportunity to schedule an initial appointment:       [facility] [address]      [place], [place] [address]      [phone]       [facility]      [address]      [place], [place] [address]      [phone]       [facility]      [address]      [place], [place] [address]      [phone]

## 2020-05-24 NOTE — ED Notes (Signed)
Called pt's significant other per request by MD to inquired if he can come pick the pt up from ER or not.  No answer.  This RN left a message (non-detailed) stating to call the emergency department back once he receives the message.  MD aware.

## 2020-05-24 NOTE — ED Notes (Signed)
Pts husband called to pick her up for discharge. States he is on the way

## 2020-05-24 NOTE — Telephone Encounter (Signed)
Left a detailed message on pt phone

## 2020-05-24 NOTE — BH Assessment (Signed)
[facility] Assessment Progress Note  Per Berneice Heinrich, NP, this voluntary pt does not require psychiatric hospitalization at this time.  Pt is psychiatrically cleared.  Discharge instructions include referrals for several area psychiatry providers.  EDP Kristine Royal, MD and pt's nurse, Valentina Gu, have been notified.  Doylene Canning, MA Triage Specialist [phone]

## 2020-05-24 NOTE — ED Notes (Signed)
I gave patient a breakfast tray 

## 2020-05-24 NOTE — ED Notes (Signed)
Pt changed out at this time. Pt belongings including a red flip phone is in 9-12 storage cabinet .

## 2020-05-25 LAB — URINE CULTURE

## 2020-05-27 ENCOUNTER — Telehealth: Payer: Self-pay | Admitting: Family Medicine

## 2020-05-27 NOTE — Telephone Encounter (Signed)
Left detailed message  for pt regarding MRI results, advised that Dr Clent Ridges has not received the results and pt will be notified soon as the results are back

## 2020-05-27 NOTE — Telephone Encounter (Signed)
Patient is calling and wanted to see if her results were back from her MRI yet, please advise. CB is [phone]

## 2020-05-28 ENCOUNTER — Other Ambulatory Visit: Payer: Self-pay

## 2020-05-29 ENCOUNTER — Inpatient Hospital Stay: Payer: Medicare HMO | Admitting: Family Medicine

## 2020-05-31 ENCOUNTER — Inpatient Hospital Stay: Payer: Medicare HMO | Admitting: Family Medicine

## 2020-06-01 NOTE — Telephone Encounter (Signed)
No MRI results showing in Pt chart, shows that its scheduled for April 2022, pt has an appointment on 06/03/2020 with Dr Clent Ridges

## 2020-06-03 ENCOUNTER — Other Ambulatory Visit: Payer: Self-pay

## 2020-06-03 ENCOUNTER — Ambulatory Visit (INDEPENDENT_AMBULATORY_CARE_PROVIDER_SITE_OTHER): Payer: Medicare HMO | Admitting: Family Medicine

## 2020-06-03 ENCOUNTER — Encounter: Payer: Self-pay | Admitting: Family Medicine

## 2020-06-03 ENCOUNTER — Inpatient Hospital Stay: Payer: Medicare HMO | Admitting: Family Medicine

## 2020-06-03 VITALS — BP 128/80 | HR 117 | Temp 97.7°F | Wt 208.0 lb

## 2020-06-03 DIAGNOSIS — R55 Syncope and collapse: Secondary | ICD-10-CM | POA: Diagnosis not present

## 2020-06-03 DIAGNOSIS — N390 Urinary tract infection, site not specified: Secondary | ICD-10-CM

## 2020-06-03 DIAGNOSIS — R319 Hematuria, unspecified: Secondary | ICD-10-CM | POA: Diagnosis not present

## 2020-06-03 LAB — URINALYSIS
Bilirubin Urine: NEGATIVE
Hgb urine dipstick: NEGATIVE
Ketones, ur: NEGATIVE
Leukocytes,Ua: NEGATIVE
Nitrite: NEGATIVE
Specific Gravity, Urine: <=1.005 — AB (ref 1.000–1.030)
Total Protein, Urine: NEGATIVE
Urine Glucose: NEGATIVE
Urobilinogen, UA: 0.2 (ref 0.0–1.0)
pH: 6 (ref 5.0–8.0)

## 2020-06-03 NOTE — Progress Notes (Signed)
   Subjective:    Patient ID: Laurie Schaefer, female    DOB: 1956/01/23, 65 y.o.   MRN: [id number]  HPI Here to follow up an ED visit on 05-23-20 for a near syncopal episode and a fall at home. She was in the bathroom and had just gotten out of the shower. She says it was a long and very hot shower. She doesn't have full memory of the episode, but she cried out and her husband found her on the floor. There was no head trauma. At the ED a head CT was normal. Labs revealed an elevated WBC of 15.0 and her urine was positive for leukocytes. She had a fever of 100 degrees.  It was determined that she had a UTI and the combination of this and the hot shower caused her to have a vagal hypotensive episode. She was given IV fluids and sent home with a course of Keflex. The urine culture grew multiple species and was not helpful. Since then she has felt fine with no urinary symptoms. No neurologic deficits.    Review of Systems  Constitutional: Negative.   Respiratory: Negative.   Cardiovascular: Negative.   Gastrointestinal: Negative.   Genitourinary: Negative.   Neurological: Negative.        Objective:   Physical Exam Constitutional:      Appearance: Normal appearance.  Cardiovascular:     Rate and Rhythm: Normal rate and regular rhythm.     Pulses: Normal pulses.     Heart sounds: Normal heart sounds.  Pulmonary:     Effort: Pulmonary effort is normal.     Breath sounds: Normal breath sounds.  Neurological:     General: No focal deficit present.     Mental Status: She is alert. Mental status is at baseline.           Assessment & Plan:  She seems to have recovered from a UTI and a hypotensive episode. I remindedher to drink plenty of water every day. We will check a UA today. Gershon Crane, MD

## 2020-06-18 ENCOUNTER — Telehealth: Payer: Self-pay | Admitting: Family Medicine

## 2020-06-18 NOTE — Telephone Encounter (Signed)
Patient is calling and requesting a refill for apixaban (ELIQUIS) 5 MG TABS tablet and oxycodone 30 mg sent to [facility] [address], please advise. CB is [phone]

## 2020-06-19 ENCOUNTER — Telehealth (INDEPENDENT_AMBULATORY_CARE_PROVIDER_SITE_OTHER): Payer: Medicare HMO | Admitting: Family Medicine

## 2020-06-19 ENCOUNTER — Encounter: Payer: Self-pay | Admitting: Family Medicine

## 2020-06-19 DIAGNOSIS — M797 Fibromyalgia: Secondary | ICD-10-CM | POA: Diagnosis not present

## 2020-06-19 DIAGNOSIS — F1190 Opioid use, unspecified, uncomplicated: Secondary | ICD-10-CM | POA: Diagnosis not present

## 2020-06-19 MED ORDER — OXYCODONE HCL 30 MG PO TABS: 30.0000 mg | ORAL_TABLET | Freq: Four times a day (QID) | ORAL | 0 refills | Status: DC | PRN

## 2020-06-19 MED ORDER — OXYCODONE HCL 30 MG PO TABS: 30.0000 mg | ORAL_TABLET | Freq: Four times a day (QID) | ORAL | 0 refills | Status: AC | PRN

## 2020-06-19 MED ORDER — APIXABAN 5 MG PO TABS: 5.0000 mg | ORAL_TABLET | Freq: Two times a day (BID) | ORAL | 5 refills | Status: DC

## 2020-06-19 NOTE — Progress Notes (Signed)
   Subjective:    Patient ID: Laurie Schaefer, female    DOB: Mar 28, 1955, 65 y.o.   MRN: [id number]  HPI Virtual Visit via Telephone Note  I connected with the patient on 06/19/20 at  3:45 PM EDT by telephone and verified that I am speaking with the correct person using two identifiers.   I discussed the limitations, risks, security and privacy concerns of performing an evaluation and management service by telephone and the availability of in person appointments. I also discussed with the patient that there may be a patient responsible charge related to this service. The patient expressed understanding and agreed to proceed.  Location patient: home Location provider: work or home office Participants present for the call: patient, provider Patient did not have a visit in the prior 7 days to address this/these issue(s).   History of Present Illness: Here for pain management, she is doing well.    Observations/Objective: Patient sounds cheerful and well on the phone. I do not appreciate any SOB. Speech and thought processing are grossly intact. Patient reported vitals:  Assessment and Plan: Pain management.  Indication for chronic opioid: fibromyalgia Medication and dose: Oxycodone 30 mg # pills per month: 120 Last UDS date: 01-15-20 Opioid Treatment Agreement signed (Y/N): 01-15-20 Opioid Treatment Agreement last reviewed with patient:  06-19-20 NCCSRS reviewed this encounter (include red flags): Yes Her meds were refilled.  Gershon Crane, MD  Follow Up Instructions:     [phone] 5-10 [phone] 11-20 9443 21-30 I did not refer this patient for an OV in the next 24 hours for this/these issue(s).  I discussed the assessment and treatment plan with the patient. The patient was provided an opportunity to ask questions and all were answered. The patient agreed with the plan and demonstrated an understanding of the instructions.   The patient was advised to call back or seek an  in-person evaluation if the symptoms worsen or if the condition fails to improve as anticipated.  I provided 7 minutes of non-face-to-face time during this encounter.   Gershon Crane, MD    Review of Systems     Objective:   Physical Exam        Assessment & Plan:

## 2020-06-19 NOTE — Telephone Encounter (Signed)
Patient is scheduled for a telephone visit this afternoon at 3:45

## 2020-06-19 NOTE — Telephone Encounter (Signed)
See Dr. Clent Ridges message

## 2020-06-19 NOTE — Telephone Encounter (Signed)
She needs a PMV 

## 2020-07-01 ENCOUNTER — Other Ambulatory Visit: Payer: Self-pay

## 2020-07-01 ENCOUNTER — Ambulatory Visit
Admission: RE | Admit: 2020-07-01 | Discharge: 2020-07-01 | Disposition: A | Discharge status: Home / Self Care | Payer: Medicare HMO | Source: Ambulatory Visit | Attending: Family Medicine | Admitting: Family Medicine

## 2020-07-01 DIAGNOSIS — Z1231 Encounter for screening mammogram for malignant neoplasm of breast: Secondary | ICD-10-CM

## 2020-07-03 ENCOUNTER — Encounter: Payer: Self-pay | Admitting: Family Medicine

## 2020-07-03 ENCOUNTER — Other Ambulatory Visit: Payer: Self-pay | Admitting: Family Medicine

## 2020-07-03 DIAGNOSIS — R928 Other abnormal and inconclusive findings on diagnostic imaging of breast: Secondary | ICD-10-CM

## 2020-07-05 ENCOUNTER — Encounter: Payer: Self-pay | Admitting: Family Medicine

## 2020-07-05 ENCOUNTER — Ambulatory Visit: Payer: Medicare HMO | Admitting: Family Medicine

## 2020-07-09 ENCOUNTER — Ambulatory Visit (INDEPENDENT_AMBULATORY_CARE_PROVIDER_SITE_OTHER): Payer: Medicare HMO

## 2020-07-09 ENCOUNTER — Ambulatory Visit (INDEPENDENT_AMBULATORY_CARE_PROVIDER_SITE_OTHER): Payer: Medicare HMO | Admitting: Family Medicine

## 2020-07-09 ENCOUNTER — Other Ambulatory Visit: Payer: Self-pay

## 2020-07-09 ENCOUNTER — Encounter: Payer: Self-pay | Admitting: Family Medicine

## 2020-07-09 VITALS — BP 124/78 | HR 87 | Temp 97.8°F | Wt 205.0 lb

## 2020-07-09 DIAGNOSIS — M546 Pain in thoracic spine: Secondary | ICD-10-CM

## 2020-07-09 DIAGNOSIS — G43909 Migraine, unspecified, not intractable, without status migrainosus: Secondary | ICD-10-CM

## 2020-07-09 DIAGNOSIS — M797 Fibromyalgia: Secondary | ICD-10-CM | POA: Diagnosis not present

## 2020-07-09 MED ORDER — SUMATRIPTAN SUCCINATE 100 MG PO TABS: 100.0000 mg | ORAL_TABLET | ORAL | 11 refills | Status: DC | PRN

## 2020-07-09 MED ORDER — PREGABALIN 25 MG PO CAPS: 25.0000 mg | ORAL_CAPSULE | Freq: Every day | ORAL | 2 refills | Status: DC

## 2020-07-09 NOTE — Progress Notes (Signed)
   Subjective:    Patient ID: Laurie Schaefer, female    DOB: 1955/10/11, 65 y.o.   MRN: [id number]  HPI Here for several issues. First since she fell at home on 05-24-20 she has had a sharp pain in the center of her back between the shoulder blades. She was seen in the ED that day, but no Xrays of the spine were taken. Second she has had daily headaches for the past 6 weeks. She has a hx of migraines, but these had gone away for a few years. Now these recent headaches feel just like old migraines did. The pain starts on one side of the top of her head and then spreads to both sides. The pain is throbbing and lights and sounds make it worse. She gets nauseated with them but has not vomited. Third she has been trying Lyrica to help with fibromyalgia pain and this has been very helpful, however it is too sedating and she want to try a lower dose. Currently she takes 100 mg of this at bedtime.    Review of Systems  Constitutional: Negative.   Respiratory: Negative.   Cardiovascular: Negative.   Musculoskeletal: Positive for back pain and myalgias.  Neurological: Positive for headaches.       Objective:   Physical Exam Constitutional:      Appearance: Normal appearance.  Cardiovascular:     Rate and Rhythm: Normal rate and regular rhythm.     Pulses: Normal pulses.     Heart sounds: Normal heart sounds.  Pulmonary:     Effort: Pulmonary effort is normal.     Breath sounds: Normal breath sounds.  Musculoskeletal:     Comments: Tender along the thoracic spine with full ROM  Neurological:     General: No focal deficit present.     Mental Status: She is alert. Mental status is at baseline.           Assessment & Plan:  For the thoracic back pain, we will get thoracic spine Xrays to day to rule out a compression fracture, etc. Her headaches do sound like migraines, so she will try Imitrex 100 mg when they start. For the fibromyalgia,. We will reduce the Lyrica to 25 mg at bedtime.   Gershon Crane, MD

## 2020-07-17 ENCOUNTER — Other Ambulatory Visit: Payer: Self-pay

## 2020-07-17 ENCOUNTER — Other Ambulatory Visit: Payer: Self-pay | Admitting: Family Medicine

## 2020-07-17 ENCOUNTER — Ambulatory Visit
Admission: RE | Admit: 2020-07-17 | Discharge: 2020-07-17 | Disposition: A | Discharge status: Home / Self Care | Payer: Medicare HMO | Source: Ambulatory Visit | Attending: Family Medicine | Admitting: Family Medicine

## 2020-07-17 DIAGNOSIS — N6321 Unspecified lump in the left breast, upper outer quadrant: Secondary | ICD-10-CM | POA: Diagnosis not present

## 2020-07-17 DIAGNOSIS — R928 Other abnormal and inconclusive findings on diagnostic imaging of breast: Secondary | ICD-10-CM

## 2020-07-22 ENCOUNTER — Encounter: Payer: Self-pay | Admitting: Family Medicine

## 2020-07-23 ENCOUNTER — Other Ambulatory Visit: Payer: Medicare HMO

## 2020-07-23 NOTE — Telephone Encounter (Signed)
Yes she is safe to have the tooth fixed and to have the masses biopsied

## 2020-07-29 ENCOUNTER — Ambulatory Visit
Admission: RE | Admit: 2020-07-29 | Discharge: 2020-07-29 | Disposition: A | Discharge status: Home / Self Care | Payer: Medicare HMO | Source: Ambulatory Visit | Attending: Family Medicine | Admitting: Family Medicine

## 2020-07-29 ENCOUNTER — Other Ambulatory Visit: Payer: Self-pay

## 2020-07-29 DIAGNOSIS — R928 Other abnormal and inconclusive findings on diagnostic imaging of breast: Secondary | ICD-10-CM

## 2020-07-29 DIAGNOSIS — N6321 Unspecified lump in the left breast, upper outer quadrant: Secondary | ICD-10-CM | POA: Diagnosis not present

## 2020-07-29 DIAGNOSIS — N641 Fat necrosis of breast: Secondary | ICD-10-CM | POA: Diagnosis not present

## 2020-07-29 DIAGNOSIS — N6322 Unspecified lump in the left breast, upper inner quadrant: Secondary | ICD-10-CM | POA: Diagnosis not present

## 2020-07-31 ENCOUNTER — Telehealth: Payer: Self-pay | Admitting: Family Medicine

## 2020-07-31 NOTE — Telephone Encounter (Signed)
Left message for patient to call back and schedule Medicare Annual Wellness Visit (AWV) either virtually or in office.   Last AWV 02/22/2019 please schedule at anytime with [facility] Nurse Health Advisor 1 or 2   This should be a 45 minute visit.

## 2020-08-12 ENCOUNTER — Other Ambulatory Visit: Payer: Self-pay | Admitting: Family Medicine

## 2020-08-12 NOTE — Telephone Encounter (Signed)
Pt LOV was 07/09/2020 Last refill for Lorazepam was done 01/26/2020 for 120 tablets with 5 refills. Please advise Pt pharmacy also state that the they are out of stock on all  Oxycodone doses, state to have a new script sen to [facility] [address], pt is fine with picking up both Rx from [facility] [address]. Please advise

## 2020-08-12 NOTE — Telephone Encounter (Signed)
Patient is also requesting a refill for oxycodone (ROXICODONE) 30 MG immediate release tablet to be sent to   [id number] [place], [place] - [address] AT [facility] [address]  [address] [place] [address]  Phone:  [phone] Fax:  [phone]

## 2020-08-19 ENCOUNTER — Encounter: Payer: Self-pay | Admitting: Family Medicine

## 2020-08-19 DIAGNOSIS — Z0000 Encounter for general adult medical examination without abnormal findings: Secondary | ICD-10-CM

## 2020-08-19 DIAGNOSIS — I2699 Other pulmonary embolism without acute cor pulmonale: Secondary | ICD-10-CM

## 2020-08-21 NOTE — Telephone Encounter (Signed)
I just put in the referral for a colonoscopy. We had not spoken about a CT scan for a blood clot. I know she had one last November. We cannot order another CT now because of the shortage of contrast material. If she wishes, I can order a blood test (a D dimer) instead to rule out any clots in her body

## 2020-08-22 NOTE — Telephone Encounter (Signed)
Left a detailed message for pt to return my call in the office regarding her questions about colonoscopy and CT.

## 2020-09-05 MED ORDER — ZOLPIDEM TARTRATE 10 MG PO TABS: 10.0000 mg | ORAL_TABLET | Freq: Every evening | ORAL | 5 refills | Status: DC | PRN

## 2020-09-05 NOTE — Telephone Encounter (Signed)
I spoke to her husband, Nida Boatman, about 2 issues. First she has a lot of dental pain and she needs a surgery. Her oral surgeon is not comfortable with her holding her Eliquis for several days prior to a surgery because of her hx of a PE. This was discovered on a CT angiogram last November. She has been taking Eliquis since then, and she has not had a follow up CT study since then. The oral surgeon insists on her getting another study before he can proceed with surgery. I think this is a good idea, since it has been 7 months since her last scan. We will order a chest CT angiogram (now that contrast material is again available). Also Pam cannot sleep and she asks for help. We will refill her Zolpidem to take at bedtime.

## 2020-09-11 ENCOUNTER — Telehealth: Payer: Self-pay | Admitting: Family Medicine

## 2020-09-11 ENCOUNTER — Telehealth: Payer: Self-pay | Admitting: Cardiovascular Disease

## 2020-09-11 ENCOUNTER — Telehealth: Payer: Self-pay | Admitting: Physician Assistant

## 2020-09-11 DIAGNOSIS — R079 Chest pain, unspecified: Secondary | ICD-10-CM

## 2020-09-11 DIAGNOSIS — I2699 Other pulmonary embolism without acute cor pulmonale: Secondary | ICD-10-CM

## 2020-09-11 NOTE — Telephone Encounter (Signed)
Returned call to patient-she states she is trying to get her CT completed.   Advised this was ordered by Dr. Clent Ridges and she needs to contact their office to discuss this scan.    Patient request to have this done at our office.  Advised we do not do CTs in our office.   Encouraged patient to call PCP-ordering MD to discuss further.   Patient verbalized understanding.

## 2020-09-11 NOTE — Telephone Encounter (Signed)
Pt is having cramping pain in her R arm. She can hardly move her R arm.  She is also having N&V, diaphoresis.   She feels bad all over.   She is very upset and crying.  Requested she call 911 and go to the hospital.  She does not want to go to a [facility] because they are mean to her there.   They would not give her the pain meds that she takes chronically.  1) Do we have a machine that will check her for a clot. Yes, but the schedule may be full.  2) can you get a clot if you are taking your Eliquis every day.  No  She assured me she has not missed any doses of her Eliquis. She is not taking her Abilify every day.  She will call back at 8 and see if the office has a machine.   Theodore Demark, PA-C 09/11/2020 6:56 AM

## 2020-09-11 NOTE — Telephone Encounter (Signed)
New message:      Patient calling to get a order for a CT. Patient is having a pain and requesting to to have a CT. I told the patient patient it looks like the patient insurance dined it. Patient states she called her PCP doctor and they told her to go to the ER. Patient said she is not going she want to see Dr. Val Eagle' Jennette Kettle today.

## 2020-09-11 NOTE — Telephone Encounter (Signed)
Her insurance declined a chest CT angiogram to follow up her PE, so we changed the order to a contrasted chest CT

## 2020-09-12 ENCOUNTER — Inpatient Hospital Stay: Admission: RE | Admit: 2020-09-12 | Payer: Medicare HMO | Source: Ambulatory Visit

## 2020-09-13 ENCOUNTER — Other Ambulatory Visit: Payer: Medicare HMO

## 2020-09-13 ENCOUNTER — Encounter: Payer: Self-pay | Admitting: Family Medicine

## 2020-09-13 NOTE — Telephone Encounter (Signed)
The scan will tell us about everything in her lungs

## 2020-09-16 ENCOUNTER — Other Ambulatory Visit: Payer: Self-pay | Admitting: Family Medicine

## 2020-09-17 ENCOUNTER — Ambulatory Visit
Admission: RE | Admit: 2020-09-17 | Discharge: 2020-09-17 | Disposition: A | Discharge status: Home / Self Care | Payer: Medicare HMO | Source: Ambulatory Visit | Attending: Family Medicine | Admitting: Family Medicine

## 2020-09-17 ENCOUNTER — Ambulatory Visit: Payer: Medicare HMO

## 2020-09-17 ENCOUNTER — Other Ambulatory Visit: Payer: Self-pay

## 2020-09-17 DIAGNOSIS — R079 Chest pain, unspecified: Secondary | ICD-10-CM

## 2020-09-18 ENCOUNTER — Encounter: Payer: Self-pay | Admitting: Family Medicine

## 2020-09-18 NOTE — Telephone Encounter (Signed)
The first test I ordered was a CT angiogram. But I was told that her insurance company denied it. She needs to talk to [facility] to find out why this was denied

## 2020-09-18 NOTE — Telephone Encounter (Signed)
Spoke with pt spouse, advised that pt insurance denies coverage of her CT Angio, Advised that Dr Clent Ridges recommends to reach out to pt oral surgeon and explain that her insurance will not cover for another CT. Advised per Dr Clent Ridges to tell the oral surgeon that a D-Dimer order can be placed to rule out any blood clots/PE. Awaiting to hear back from pt so Dr Clent Ridges can order the lab.

## 2020-09-19 ENCOUNTER — Ambulatory Visit: Payer: Medicare HMO

## 2020-09-19 ENCOUNTER — Telehealth: Payer: Self-pay | Admitting: Family Medicine

## 2020-09-19 NOTE — Telephone Encounter (Signed)
The patient called to let Dr. Clent Ridges know that she needs the order for the CT Angio of the chest to rule out Pulmonary Emboli  The first one that was ordered was the one that she needs.  Please advise

## 2020-09-19 NOTE — Telephone Encounter (Signed)
Wille Celeste is calling from [facility] [employer] to get a authorization for the pt to get a new CT with the correct information on it due to the one the previously was done incorrectly.  Authorization # [id number] and the case #[id number] will have all the information of the correction.

## 2020-09-19 NOTE — Telephone Encounter (Signed)
Spoke with [employer] agent state that pt  CT thorax w/o contrast Dx Code: (R 07.9) chest pain was approved. Please advise

## 2020-09-20 ENCOUNTER — Telehealth: Payer: Self-pay | Admitting: Family Medicine

## 2020-09-20 NOTE — Telephone Encounter (Signed)
See phone message from 09/18/20

## 2020-09-20 NOTE — Telephone Encounter (Signed)
Patient states she wants the original scan (Angio Ct of the chest to rule out pulmonary emboli) that Dr. Clent Ridges ordered to rule out embolism.  She called [employer] and [employer] was supposed to call the office to get it reordered.  Patient is requesting a call back.

## 2020-09-20 NOTE — Telephone Encounter (Signed)
Message sent to MyChart.

## 2020-09-20 NOTE — Telephone Encounter (Signed)
Well as we already discussed, her insurance will not cover this type of scan. To be quite frank, I think it is ridiculous that the oral surgeon is insisting on this test when her primary care physician (me) does not feel it is necessary. I suggest she find another oral surgeon who will perform the surgery she needs without all this nonsense

## 2020-09-20 NOTE — Telephone Encounter (Signed)
The only type of CT that would serve the purpose is a CT angiogram (which [employer] will not cover). See my other note about this

## 2020-09-20 NOTE — Telephone Encounter (Signed)
See my answer to her Advice Request earlier today

## 2020-09-26 ENCOUNTER — Other Ambulatory Visit: Payer: Self-pay | Admitting: Family Medicine

## 2020-10-01 ENCOUNTER — Telehealth: Payer: Self-pay | Admitting: Family Medicine

## 2020-10-01 NOTE — Telephone Encounter (Signed)
Left message for patient to call back and schedule Medicare Annual Wellness Visit (AWV) either virtually or in office.   Last AWVI 02/22/2019  please schedule at anytime with [facility] Nurse Health Advisor 1 or 2   This should be a 45 minute visit.

## 2020-10-07 ENCOUNTER — Other Ambulatory Visit: Payer: Medicare HMO

## 2020-10-17 ENCOUNTER — Inpatient Hospital Stay: Admission: RE | Admit: 2020-10-17 | Payer: Medicare HMO | Source: Ambulatory Visit

## 2020-10-17 ENCOUNTER — Other Ambulatory Visit: Payer: Medicare HMO

## 2020-10-21 ENCOUNTER — Other Ambulatory Visit: Payer: Medicare HMO

## 2020-10-22 ENCOUNTER — Telehealth: Payer: Self-pay | Admitting: Family Medicine

## 2020-10-22 NOTE — Telephone Encounter (Signed)
oxycodone (ROXICODONE) 30 MG immediate release tablet sent to  [id number] [place], [place] - [address] AT [place] [address] Phone:  [phone]  Fax:  [phone]

## 2020-10-22 NOTE — Telephone Encounter (Signed)
Pt call and stated she need a refill on  

## 2020-10-26 ENCOUNTER — Encounter: Payer: Self-pay | Admitting: Family Medicine

## 2020-10-30 ENCOUNTER — Telehealth: Payer: Self-pay | Admitting: Family Medicine

## 2020-10-30 NOTE — Telephone Encounter (Signed)
Left message for patient to call back and schedule Medicare Annual Wellness Visit (AWV) either virtually or in office.  Left  my Zachery Conch number [phone]   Last AWV ;02/22/2019 please schedule at anytime with [facility] Nurse Health Advisor 1 or 2   This should be a 45 minute visit.

## 2020-10-31 NOTE — Telephone Encounter (Signed)
She needs a PMV 

## 2020-10-31 NOTE — Telephone Encounter (Signed)
Pt is scheduled for PMV on 11/01/2020

## 2020-11-01 ENCOUNTER — Inpatient Hospital Stay: Admission: RE | Admit: 2020-11-01 | Payer: Medicare HMO | Source: Ambulatory Visit

## 2020-11-01 ENCOUNTER — Encounter: Payer: Self-pay | Admitting: Family Medicine

## 2020-11-01 ENCOUNTER — Telehealth (INDEPENDENT_AMBULATORY_CARE_PROVIDER_SITE_OTHER): Payer: Medicare HMO | Admitting: Family Medicine

## 2020-11-01 DIAGNOSIS — F1190 Opioid use, unspecified, uncomplicated: Secondary | ICD-10-CM

## 2020-11-01 DIAGNOSIS — M797 Fibromyalgia: Secondary | ICD-10-CM | POA: Diagnosis not present

## 2020-11-01 MED ORDER — OXYCODONE HCL 30 MG PO TABS: 30.0000 mg | ORAL_TABLET | Freq: Four times a day (QID) | ORAL | 0 refills | Status: DC | PRN

## 2020-11-01 MED ORDER — PREGABALIN 50 MG PO CAPS: 50.0000 mg | ORAL_CAPSULE | Freq: Every day | ORAL | 5 refills | Status: DC

## 2020-11-01 MED ORDER — OXYCODONE HCL 30 MG PO TABS: 30.0000 mg | ORAL_TABLET | Freq: Four times a day (QID) | ORAL | 0 refills | Status: AC | PRN

## 2020-11-01 MED ORDER — LORAZEPAM 2 MG PO TABS: ORAL_TABLET | ORAL | 5 refills | Status: DC

## 2020-11-01 MED ORDER — ZOLPIDEM TARTRATE 10 MG PO TABS
10.0000 mg | ORAL_TABLET | Freq: Every evening | ORAL | 5 refills | Status: DC | PRN
Start: 2020-11-01 — End: 2021-03-06

## 2020-11-01 NOTE — Progress Notes (Signed)
   Subjective:    Patient ID: Laurie Schaefer, female    DOB: Dec 01, 1955, 65 y.o.   MRN: [id number]  HPI Virtual Visit via Telephone Note  I connected with the patient on 11/01/20 at  2:15 PM EDT by telephone and verified that I am speaking with the correct person using two identifiers.   I discussed the limitations, risks, security and privacy concerns of performing an evaluation and management service by telephone and the availability of in person appointments. I also discussed with the patient that there may be a patient responsible charge related to this service. The patient expressed understanding and agreed to proceed.  Location patient: home Location provider: work or home office Participants present for the call: patient, provider Patient did not have a visit in the prior 7 days to address this/these issue(s).   History of Present Illness: Here for pain management, she is doing well.    Observations/Objective: Patient sounds cheerful and well on the phone. I do not appreciate any SOB. Speech and thought processing are grossly intact. Patient reported vitals:  Assessment and Plan: Pain management. Indication for chronic opioid: fibromyalgia  Medication and dose: Oxycodone 30 mg  # pills per month: 120 Last UDS date: 01-15-20 Opioid Treatment Agreement signed (Y/N): 01-15-20 Opioid Treatment Agreement last reviewed with patient:  11-01-20 NCCSRS reviewed this encounter (include red flags): Yes Meds were refilled.  Gershon Crane, MD   Follow Up Instructions:     [phone] 5-10 [phone] 11-20 [phone] 21-30 I did not refer this patient for an OV in the next 24 hours for this/these issue(s).  I discussed the assessment and treatment plan with the patient. The patient was provided an opportunity to ask questions and all were answered. The patient agreed with the plan and demonstrated an understanding of the instructions.   The patient was advised to call back or seek an  in-person evaluation if the symptoms worsen or if the condition fails to improve as anticipated.  I provided 16 minutes of non-face-to-face time during this encounter.   Gershon Crane, MD     Review of Systems     Objective:   Physical Exam        Assessment & Plan:

## 2020-11-07 ENCOUNTER — Telehealth: Payer: Self-pay | Admitting: Family Medicine

## 2020-11-07 NOTE — Telephone Encounter (Signed)
Please advise 

## 2020-11-07 NOTE — Telephone Encounter (Signed)
PT dentist office called to advise that PT is having a extraction done and the PT inform them that she is taking elliquis. They are needing a medical release filled out and are faxing on and wanted to advise [place] of this.

## 2020-11-08 NOTE — Telephone Encounter (Signed)
Pt Treatment Release Form has been completed and faxed to the dentist office, pt was notified.

## 2020-11-08 NOTE — Telephone Encounter (Signed)
The form is ready  

## 2020-11-11 ENCOUNTER — Encounter: Payer: Self-pay | Admitting: Family Medicine

## 2020-11-12 ENCOUNTER — Other Ambulatory Visit: Payer: Self-pay

## 2020-11-12 MED ORDER — AMOXICILLIN 500 MG PO TABS: ORAL_TABLET | ORAL | 0 refills | Status: DC

## 2020-11-12 NOTE — Telephone Encounter (Signed)
Call in Amoxicillin 500 mg TID for 10 days  

## 2020-11-14 ENCOUNTER — Telehealth: Payer: Self-pay | Admitting: Family Medicine

## 2020-11-14 NOTE — Telephone Encounter (Signed)
Left message asking patient to call [phone]  Please r/s 11/15/20  AWV appointment.  Vernona Rieger will be out of office

## 2020-11-15 ENCOUNTER — Encounter: Payer: Self-pay | Admitting: Family Medicine

## 2020-11-15 ENCOUNTER — Ambulatory Visit: Payer: Medicare HMO

## 2020-11-26 ENCOUNTER — Ambulatory Visit (INDEPENDENT_AMBULATORY_CARE_PROVIDER_SITE_OTHER): Payer: Medicare HMO

## 2020-11-26 DIAGNOSIS — Z0100 Encounter for examination of eyes and vision without abnormal findings: Secondary | ICD-10-CM | POA: Diagnosis not present

## 2020-11-26 DIAGNOSIS — Z1211 Encounter for screening for malignant neoplasm of colon: Secondary | ICD-10-CM

## 2020-11-26 DIAGNOSIS — Z0000 Encounter for general adult medical examination without abnormal findings: Secondary | ICD-10-CM

## 2020-11-26 NOTE — Patient Instructions (Signed)
Laurie Schaefer , Thank you for taking time to come for your Medicare Wellness Visit. I appreciate your ongoing commitment to your health goals. Please review the following plan we discussed and let me know if I can assist you in the future.   Screening recommendations/referrals: Colonoscopy: referral 11/26/2020 Mammogram: 07/01/2020 Bone Density: 04/26/2017 Recommended yearly ophthalmology/optometry visit for glaucoma screening and checkup Recommended yearly dental visit for hygiene and checkup  Vaccinations: Influenza vaccine: due in fall 2022  Pneumococcal vaccine: completed series  Tdap vaccine: due with injury  Shingles vaccine: will consider     Advanced directives: will provide copies   Conditions/risks identified: none   Next appointment: none    Preventive Care 65 Years and Older, Female Preventive care refers to lifestyle choices and visits with your health care provider that can promote health and wellness. What does preventive care include? A yearly physical exam. This is also called an annual well check. Dental exams once or twice a year. Routine eye exams. Ask your health care provider how often you should have your eyes checked. Personal lifestyle choices, including: Daily care of your teeth and gums. Regular physical activity. Eating a healthy diet. Avoiding tobacco and drug use. Limiting alcohol use. Practicing safe sex. Taking low-dose aspirin every day. Taking vitamin and mineral supplements as recommended by your health care provider. What happens during an annual well check? The services and screenings done by your health care provider during your annual well check will depend on your age, overall health, lifestyle risk factors, and family history of disease. Counseling  Your health care provider may ask you questions about your: Alcohol use. Tobacco use. Drug use. Emotional well-being. Home and relationship well-being. Sexual activity. Eating  habits. History of falls. Memory and ability to understand (cognition). Work and work Astronomer. Reproductive health. Screening  You may have the following tests or measurements: Height, weight, and BMI. Blood pressure. Lipid and cholesterol levels. These may be checked every 5 years, or more frequently if you are over 55 years old. Skin check. Lung cancer screening. You may have this screening every year starting at age 49 if you have a 30-pack-year history of smoking and currently smoke or have quit within the past 15 years. Fecal occult blood test (FOBT) of the stool. You may have this test every year starting at age 56. Flexible sigmoidoscopy or colonoscopy. You may have a sigmoidoscopy every 5 years or a colonoscopy every 10 years starting at age 54. Hepatitis C blood test. Hepatitis B blood test. Sexually transmitted disease (STD) testing. Diabetes screening. This is done by checking your blood sugar (glucose) after you have not eaten for a while (fasting). You may have this done every 1-3 years. Bone density scan. This is done to screen for osteoporosis. You may have this done starting at age 26. Mammogram. This may be done every 1-2 years. Talk to your health care provider about how often you should have regular mammograms. Talk with your health care provider about your test results, treatment options, and if necessary, the need for more tests. Vaccines  Your health care provider may recommend certain vaccines, such as: Influenza vaccine. This is recommended every year. Tetanus, diphtheria, and acellular pertussis (Tdap, Td) vaccine. You may need a Td booster every 10 years. Zoster vaccine. You may need this after age 42. Pneumococcal 13-valent conjugate (PCV13) vaccine. One dose is recommended after age 82. Pneumococcal polysaccharide (PPSV23) vaccine. One dose is recommended after age 58. Talk to your health care provider  about which screenings and vaccines you need and how  often you need them. This information is not intended to replace advice given to you by your health care provider. Make sure you discuss any questions you have with your health care provider. Document Released: 03/22/2015 Document Revised: 11/13/2015 Document Reviewed: 12/25/2014 Elsevier Interactive Patient Education  2017 [place] Prevention in the Home Falls can cause injuries. They can happen to people of all ages. There are many things you can do to make your home safe and to help prevent falls. What can I do on the outside of my home? Regularly fix the edges of walkways and driveways and fix any cracks. Remove anything that might make you trip as you walk through a door, such as a raised step or threshold. Trim any bushes or trees on the path to your home. Use bright outdoor lighting. Clear any walking paths of anything that might make someone trip, such as rocks or tools. Regularly check to see if handrails are loose or broken. Make sure that both sides of any steps have handrails. Any raised decks and porches should have guardrails on the edges. Have any leaves, snow, or ice cleared regularly. Use sand or salt on walking paths during winter. Clean up any spills in your garage right away. This includes oil or grease spills. What can I do in the bathroom? Use night lights. Install grab bars by the toilet and in the tub and shower. Do not use towel bars as grab bars. Use non-skid mats or decals in the tub or shower. If you need to sit down in the shower, use a plastic, non-slip stool. Keep the floor dry. Clean up any water that spills on the floor as soon as it happens. Remove soap buildup in the tub or shower regularly. Attach bath mats securely with double-sided non-slip rug tape. Do not have throw rugs and other things on the floor that can make you trip. What can I do in the bedroom? Use night lights. Make sure that you have a light by your bed that is easy to  reach. Do not use any sheets or blankets that are too big for your bed. They should not hang down onto the floor. Have a firm chair that has side arms. You can use this for support while you get dressed. Do not have throw rugs and other things on the floor that can make you trip. What can I do in the kitchen? Clean up any spills right away. Avoid walking on wet floors. Keep items that you use a lot in easy-to-reach places. If you need to reach something above you, use a strong step stool that has a grab bar. Keep electrical cords out of the way. Do not use floor polish or wax that makes floors slippery. If you must use wax, use non-skid floor wax. Do not have throw rugs and other things on the floor that can make you trip. What can I do with my stairs? Do not leave any items on the stairs. Make sure that there are handrails on both sides of the stairs and use them. Fix handrails that are broken or loose. Make sure that handrails are as long as the stairways. Check any carpeting to make sure that it is firmly attached to the stairs. Fix any carpet that is loose or worn. Avoid having throw rugs at the top or bottom of the stairs. If you do have throw rugs, attach them to the floor  with carpet tape. Make sure that you have a light switch at the top of the stairs and the bottom of the stairs. If you do not have them, ask someone to add them for you. What else can I do to help prevent falls? Wear shoes that: Do not have high heels. Have rubber bottoms. Are comfortable and fit you well. Are closed at the toe. Do not wear sandals. If you use a stepladder: Make sure that it is fully opened. Do not climb a closed stepladder. Make sure that both sides of the stepladder are locked into place. Ask someone to hold it for you, if possible. Clearly mark and make sure that you can see: Any grab bars or handrails. First and last steps. Where the edge of each step is. Use tools that help you move  around (mobility aids) if they are needed. These include: Canes. Walkers. Scooters. Crutches. Turn on the lights when you go into a dark area. Replace any light bulbs as soon as they burn out. Set up your furniture so you have a clear path. Avoid moving your furniture around. If any of your floors are uneven, fix them. If there are any pets around you, be aware of where they are. Review your medicines with your doctor. Some medicines can make you feel dizzy. This can increase your chance of falling. Ask your doctor what other things that you can do to help prevent falls. This information is not intended to replace advice given to you by your health care provider. Make sure you discuss any questions you have with your health care provider. Document Released: 12/20/2008 Document Revised: 08/01/2015 Document Reviewed: 03/30/2014 Elsevier Interactive Patient Education  2017 [employer].

## 2020-11-26 NOTE — Progress Notes (Signed)
Subjective:   Laurie Schaefer is a 65 y.o. female who presents for Medicare Annual (Subsequent) preventive examination.   I connected with Charm Rings today by telephone and verified that I am speaking with the correct person using two identifiers. Location patient: home Location provider: work Persons participating in the virtual visit: patient, provider.   I discussed the limitations, risks, security and privacy concerns of performing an evaluation and management service by telephone and the availability of in person appointments. I also discussed with the patient that there may be a patient responsible charge related to this service. The patient expressed understanding and verbally consented to this telephonic visit.    Interactive audio and video telecommunications were attempted between this provider and patient, however failed, due to patient having technical difficulties OR patient did not have access to video capability.  We continued and completed visit with audio only.    Review of Systems    N/A Cardiac Risk Factors include: advanced age (>74men, >1 women);dyslipidemia;hypertension     Objective:    Today's Vitals   There is no height or weight on file to calculate BMI.  Advanced Directives 11/26/2020 05/24/2020 02/22/2019 03/30/2016 08/31/2015  Does Patient Have a Medical Advance Directive? Yes Yes No No No  Type of Estate agent of [place];Living will Healthcare Power of Attorney - - -  Does patient want to make changes to medical advance directive? - No - Patient declined - - -  Copy of Healthcare Power of Attorney in Chart? No - copy requested - - - -  Would patient like information on creating a medical advance directive? - - No - Patient declined Yes (MAU/Ambulatory/Procedural Areas - Information given) No - patient declined information    Current Medications (verified) Outpatient Encounter Medications as of 11/26/2020  Medication Sig    albuterol (VENTOLIN HFA) 108 (90 Base) MCG/ACT inhaler INHALE 2 PUFFS INTO THE LUNGS EVERY 6 HOURS AS NEEDED FOR WHEEZING OR SHORTNESS OF BREATH (Patient taking differently: Inhale 2 puffs into the lungs every 6 (six) hours as needed for shortness of breath.)   amLODipine (NORVASC) 5 MG tablet Take 1 tablet (5 mg total) by mouth daily.   amoxicillin (AMOXIL) 500 MG tablet Take 1 tablet by mouth 3 times per day.   apixaban (ELIQUIS) 5 MG TABS tablet Take 1 tablet (5 mg total) by mouth 2 (two) times daily.   ARIPiprazole (ABILIFY) 10 MG tablet TAKE 1 TABLET(10 MG) BY MOUTH DAILY   budesonide-formoterol (SYMBICORT) 160-4.5 MCG/ACT inhaler Inhale 2 puffs into the lungs 2 (two) times daily.   buPROPion (WELLBUTRIN XL) 150 MG 24 hr tablet Take 1 tablet (150 mg total) by mouth daily.   cholecalciferol (VITAMIN D) 1000 units tablet Take 1,000 Units by mouth daily.   Fexofenadine HCl (MUCINEX ALLERGY PO) Take 1 tablet by mouth daily as needed (allergies/congestion.).    fluticasone (FLONASE) 50 MCG/ACT nasal spray Place 2 sprays into both nostrils daily. (Patient taking differently: Place 2 sprays into both nostrils daily as needed for allergies.)   levalbuterol (XOPENEX) 0.63 MG/3ML nebulizer solution INHALE BY MOUTH VIA NEBULIZER EVERY 4 HOURS AS NEEDED FOR WHEEZE OR SHORTNESS OF BREATH (Patient taking differently: Take 0.63 mg by nebulization every 6 (six) hours as needed for shortness of breath.)   LORazepam (ATIVAN) 2 MG tablet TAKE 1 TABLET(2 MG) BY MOUTH EVERY 6 HOURS AS NEEDED FOR ANXIETY   losartan-hydrochlorothiazide (HYZAAR) 100-25 MG tablet Take 1 tablet by mouth daily.   [  START ON 01/01/2021] oxycodone (ROXICODONE) 30 MG immediate release tablet Take 1 tablet (30 mg total) by mouth every 6 (six) hours as needed for pain.   OXYGEN 3lpm with sleep and exertion if needed   pantoprazole (PROTONIX) 40 MG tablet Take 1 tablet (40 mg total) by mouth daily.   prazosin (MINIPRESS) 2 MG capsule Take 1  capsule (2 mg total) by mouth at bedtime.   pregabalin (LYRICA) 50 MG capsule Take 1 capsule (50 mg total) by mouth at bedtime.   QUEtiapine (SEROQUEL) 50 MG tablet Take 1 tablet (50 mg total) by mouth 2 (two) times daily. (Patient taking differently: Take 50 mg by mouth 2 (two) times daily. Pt takes 1 tablet once a day.)   SUMAtriptan (IMITREX) 100 MG tablet Take 1 tablet (100 mg total) by mouth as needed for migraine. May repeat in 2 hours if headache persists or recurs.   Tiotropium Bromide Monohydrate (SPIRIVA RESPIMAT) 2.5 MCG/ACT AERS Inhale 2 puffs into the lungs daily.   traZODone (DESYREL) 100 MG tablet TAKE 1 TABLET(100 MG) BY MOUTH AT BEDTIME   zolpidem (AMBIEN) 10 MG tablet Take 1 tablet (10 mg total) by mouth at bedtime as needed for sleep.   methocarbamol (ROBAXIN) 500 MG tablet TAKE 1 TABLET (500 MG TOTAL) BY MOUTH EVERY EIGHT HOURS AS NEEDED FOR MUSCLE SPASMS. (Patient not taking: Reported on 11/26/2020)   No facility-administered encounter medications on file as of 11/26/2020.    Allergies (verified) Codeine   History: Past Medical History:  Diagnosis Date   Arthritis    Bipolar disorder (HCC)    Chronic neck pain    had previously seen Dr. Salli Real at [facility]    COPD (chronic obstructive pulmonary disease) [facility])    sees Dr. Delton Coombes    Depression    Fibromyalgia    Headache    Hypertension    Past Surgical History:  Procedure Laterality Date   FRACTURE SURGERY     Family History  Problem Relation Age of Onset   Depression Mother    Hypertension Mother    Stroke Father    Social History   Socioeconomic History   Marital status: Single    Spouse name: Not on file   Number of children: 0   Years of education: Not on file   Highest education level: Not on file  Occupational History   Not on file  Tobacco Use   Smoking status: Never   Smokeless tobacco: Never  Substance and Sexual Activity   Alcohol use: No    Alcohol/week: 0.0 standard  drinks   Drug use: No   Sexual activity: Not on file  Other Topics Concern   Not on file  Social History Narrative   Divorced, HH 1   No children   States she's been to graduate school    Social Determinants of Health   Financial Resource Strain: Low Risk    Difficulty of Paying Living Expenses: Not hard at all  Food Insecurity: No Food Insecurity   Worried About Programme researcher, broadcasting/film/video in the Last Year: Never true   Barista in the Last Year: Never true  Transportation Needs: No Transportation Needs   Lack of Transportation (Medical): No   Lack of Transportation (Non-Medical): No  Physical Activity: Inactive   Days of Exercise per Week: 0 days   Minutes of Exercise per Session: 0 min  Stress: No Stress Concern Present   Feeling of Stress : Not at  all  Social Connections: Socially Isolated   Frequency of Communication with Friends and Family: Three times a week   Frequency of Social Gatherings with Friends and Family: Three times a week   Attends Religious Services: Never   Active Member of Clubs or Organizations: No   Attends Engineer, structural: Never   Marital Status: Divorced    Tobacco Counseling Counseling given: Not Answered   Clinical Intake:  Pre-visit preparation completed: Yes  Pain : No/denies pain     Nutritional Risks: None Diabetes: No  How often do you need to have someone help you when you read instructions, pamphlets, or other written materials from your doctor or pharmacy?: 1 - Never What is the last grade level you completed in school?: college  Diabetic?no   Interpreter Needed?: No  Information entered by :: L.Bolden Hagerman,LPN   Activities of Daily Living In your present state of health, do you have any difficulty performing the following activities: 11/26/2020  Hearing? N  Vision? N  Difficulty concentrating or making decisions? N  Walking or climbing stairs? N  Dressing or bathing? N  Doing errands, shopping? N  Preparing  Food and eating ? N  Using the Toilet? N  In the past six months, have you accidently leaked urine? N  Do you have problems with loss of bowel control? N  Managing your Medications? N  Managing your Finances? N  Housekeeping or managing your Housekeeping? N  Some recent data might be hidden    Patient Care Team: Nelwyn Salisbury, MD as PCP - General (Family Medicine)  Indicate any recent Medical Services you may have received from other than [facility] providers in the past year (date may be approximate).     Assessment:   This is a routine wellness examination for Jase.  Hearing/Vision screen Vision Screening - Comments:: Referral eye exam 11/26/2020  Dietary issues and exercise activities discussed: Current Exercise Habits: The patient does not participate in regular exercise at present, Exercise limited by: orthopedic condition(s)   Goals Addressed   None    Depression Screen PHQ 2/9 Scores 11/26/2020 11/26/2020 02/22/2019 03/30/2016  PHQ - 2 Score 0 0 6 4  PHQ- 9 Score - - 19 11    Fall Risk Fall Risk  11/26/2020 02/22/2019 03/30/2016  Falls in the past year? 0 0 Yes  Number falls in past yr: 0 - 1  Injury with Fall? 0 - Yes  Risk for fall due to : No Fall Risks Impaired mobility;Medication side effect History of fall(s);Impaired balance/gait  Follow up Falls evaluation completed Falls evaluation completed;Education provided;Falls prevention discussed Falls evaluation completed;Education provided;Falls prevention discussed    FALL RISK PREVENTION PERTAINING TO THE HOME:  Any stairs in or around the home? Yes  If so, are there any without handrails? No  Home free of loose throw rugs in walkways, pet beds, electrical cords, etc? Yes  Adequate lighting in your home to reduce risk of falls? Yes   ASSISTIVE DEVICES UTILIZED TO PREVENT FALLS:  Life alert? No  Use of a cane, walker or w/c? Yes  Grab bars in the bathroom? Yes  Shower chair or bench in shower? Yes  Elevated  toilet seat or a handicapped toilet? No    Cognitive Function:  Normal cognitive status assessed by direct observation by this Nurse Health Advisor. No abnormalities found.       Immunizations Immunization History  Administered Date(s) Administered   Influenza Inj Mdck Quad Pf 01/29/2016   Influenza  Split 12/07/2016   Influenza,inj,Quad PF,6+ Mos 01/29/2016, 12/08/2017   Influenza-Unspecified 11/29/2019   PFIZER(Purple Top)SARS-COV-2 Vaccination 04/23/2019, 05/16/2019, 12/19/2019   Pneumococcal Polysaccharide-23 01/29/2016    TDAP status: Due, Education has been provided regarding the importance of this vaccine. Advised may receive this vaccine at local pharmacy or Health Dept. Aware to provide a copy of the vaccination record if obtained from local pharmacy or Health Dept. Verbalized acceptance and understanding.  Flu Vaccine status: Up to date  Pneumococcal vaccine status: Up to date  Covid-19 vaccine status: Completed vaccines  Qualifies for Shingles Vaccine? Yes   Zostavax completed No   Shingrix Completed?: No.    Education has been provided regarding the importance of this vaccine. Patient has been advised to call insurance company to determine out of pocket expense if they have not yet received this vaccine. Advised may also receive vaccine at local pharmacy or Health Dept. Verbalized acceptance and understanding.  Screening Tests Health Maintenance  Topic Date Due   Hepatitis C Screening  Never done   TETANUS/TDAP  Never done   COLONOSCOPY (Pts 45-63yrs Insurance coverage will need to be confirmed)  Never done   Zoster Vaccines- Shingrix (1 of 2) Never done   COVID-19 Vaccine (4 - Booster for Pfizer series) 04/20/2020   INFLUENZA VACCINE  10/07/2020   MAMMOGRAM  07/01/2021   DEXA SCAN  Completed   HPV VACCINES  Aged Out    Health Maintenance  Health Maintenance Due  Topic Date Due   Hepatitis C Screening  Never done   TETANUS/TDAP  Never done   COLONOSCOPY  (Pts 45-53yrs Insurance coverage will need to be confirmed)  Never done   Zoster Vaccines- Shingrix (1 of 2) Never done   COVID-19 Vaccine (4 - Booster for Pfizer series) 04/20/2020   INFLUENZA VACCINE  10/07/2020    Colorectal cancer screening: Referral to GI placed 11/26/2020. Pt aware the office will call re: appt.  Mammogram status: Completed 07/01/2020. Repeat every year  Bone Density status: Completed 04/26/2017. Results reflect: Bone density results: OSTEOPENIA. Repeat every 5 years.  Lung Cancer Screening: (Low Dose CT Chest recommended if Age 74-80 years, 30 pack-year currently smoking OR have quit w/in 15years.) does not qualify.   Lung Cancer Screening Referral: n/a  Additional Screening:  Hepatitis C Screening: does qualify;   Vision Screening: Recommended annual ophthalmology exams for early detection of glaucoma and other disorders of the eye. Is the patient up to date with their annual eye exam?  No  Who is the provider or what is the name of the office in which the patient attends annual eye exams? Referral completed 11/26/2020 If pt is not established with a provider, would they like to be referred to a provider to establish care? Yes .   Dental Screening: Recommended annual dental exams for proper oral hygiene  Community Resource Referral / Chronic Care Management: CRR required this visit?  No   CCM required this visit?  No      Plan:     I have personally reviewed and noted the following in the patient's chart:   Medical and social history Use of alcohol, tobacco or illicit drugs  Current medications and supplements including opioid prescriptions.  Functional ability and status Nutritional status Physical activity Advanced directives List of other physicians Hospitalizations, surgeries, and ER visits in previous 12 months Vitals Screenings to include cognitive, depression, and falls Referrals and appointments  In addition, I have reviewed and  discussed with patient certain preventive  protocols, quality metrics, and best practice recommendations. A written personalized care plan for preventive services as well as general preventive health recommendations were provided to patient.     March Rummage, LPN   [id number]   Nurse Notes: none

## 2020-12-03 ENCOUNTER — Telehealth: Payer: Self-pay | Admitting: Cardiovascular Disease

## 2020-12-03 NOTE — Telephone Encounter (Signed)
Pt is returning phone call... pt would also like to provide an alternate number [phone]

## 2020-12-03 NOTE — Telephone Encounter (Signed)
Pt states that she would like to also add in the symptoms of sweating violently, shaking violently, severe headache and nausea, shallow breathing, hand cramps and low pulse.

## 2020-12-03 NOTE — Telephone Encounter (Signed)
Spoke to patient she stated she has been having chest pain, sob off and on since yesterday.She has nausea and vomited x 1 yesterday.She has a bad headache and cramping in both hands.She rates chest pain # 8.Advised she needs to go to ED to be evaluated.She refused to go to ED.Appointment scheduled with Gillian Shields NP 9/29 at 11:00 am at [facility] location.Encouraged patient again she needs to go to ED.

## 2020-12-03 NOTE — Telephone Encounter (Signed)
Left message to call back  

## 2020-12-03 NOTE — Telephone Encounter (Signed)
STAT if HR is under 50 or over 120 (normal HR is 60-100 beats per minute)  What is your heart rate? 50  Do you have a log of your heart rate readings (document readings)? 20,23,34,35  Do you have any other symptoms? Sick, headache, sob, pulse is low

## 2020-12-04 ENCOUNTER — Other Ambulatory Visit: Payer: Self-pay

## 2020-12-04 ENCOUNTER — Encounter: Payer: Self-pay | Admitting: Family Medicine

## 2020-12-04 ENCOUNTER — Ambulatory Visit: Payer: Medicare HMO | Admitting: Family Medicine

## 2020-12-04 ENCOUNTER — Ambulatory Visit (INDEPENDENT_AMBULATORY_CARE_PROVIDER_SITE_OTHER): Payer: Medicare HMO | Admitting: Family Medicine

## 2020-12-04 VITALS — BP 120/78 | HR 66 | Temp 98.5°F | Wt 198.1 lb

## 2020-12-04 DIAGNOSIS — F319 Bipolar disorder, unspecified: Secondary | ICD-10-CM

## 2020-12-04 DIAGNOSIS — J439 Emphysema, unspecified: Secondary | ICD-10-CM | POA: Diagnosis not present

## 2020-12-04 DIAGNOSIS — R5383 Other fatigue: Secondary | ICD-10-CM

## 2020-12-04 DIAGNOSIS — R001 Bradycardia, unspecified: Secondary | ICD-10-CM

## 2020-12-04 DIAGNOSIS — I10 Essential (primary) hypertension: Secondary | ICD-10-CM

## 2020-12-04 LAB — CBC WITH DIFFERENTIAL/PLATELET
Basophils Absolute: 0.1 10*3/uL (ref 0.0–0.1)
Basophils Relative: 1.1 % (ref 0.0–3.0)
Eosinophils Absolute: 0.3 10*3/uL (ref 0.0–0.7)
Eosinophils Relative: 3.1 % (ref 0.0–5.0)
HCT: 41 % (ref 36.0–46.0)
Hemoglobin: 13.6 g/dL (ref 12.0–15.0)
Lymphocytes Relative: 29.8 % (ref 12.0–46.0)
Lymphs Abs: 2.6 10*3/uL (ref 0.7–4.0)
MCHC: 33.1 g/dL (ref 30.0–36.0)
MCV: 84.7 fl (ref 78.0–100.0)
Monocytes Absolute: 0.9 10*3/uL (ref 0.1–1.0)
Monocytes Relative: 10.6 % (ref 3.0–12.0)
Neutro Abs: 4.9 10*3/uL (ref 1.4–7.7)
Neutrophils Relative %: 55.4 % (ref 43.0–77.0)
Platelets: 273 10*3/uL (ref 150.0–400.0)
RBC: 4.84 Mil/uL (ref 3.87–5.11)
RDW: 14.1 % (ref 11.5–15.5)
WBC: 8.8 10*3/uL (ref 4.0–10.5)

## 2020-12-04 LAB — BASIC METABOLIC PANEL
BUN: 8 mg/dL (ref 6–23)
CO2: 27 mEq/L (ref 19–32)
Calcium: 9 mg/dL (ref 8.4–10.5)
Chloride: 106 mEq/L (ref 96–112)
Creatinine, Ser: 0.71 mg/dL (ref 0.40–1.20)
GFR: 88.4 mL/min (ref >60.00)
Glucose, Bld: 104 mg/dL — ABNORMAL HIGH (ref 70–99)
Potassium: 3.6 mEq/L (ref 3.5–5.1)
Sodium: 140 mEq/L (ref 135–145)

## 2020-12-04 LAB — HEPATIC FUNCTION PANEL
ALT: 12 U/L (ref 0–35)
AST: 16 U/L (ref 0–37)
Albumin: 3.8 g/dL (ref 3.5–5.2)
Alkaline Phosphatase: 70 U/L (ref 39–117)
Bilirubin, Direct: 0.1 mg/dL (ref 0.0–0.3)
Total Bilirubin: 0.7 mg/dL (ref 0.2–1.2)
Total Protein: 7 g/dL (ref 6.0–8.3)

## 2020-12-04 LAB — TSH: TSH: 1.13 u[IU]/mL (ref 0.35–5.50)

## 2020-12-04 NOTE — Progress Notes (Signed)
   Subjective:    Patient ID: Laurie Schaefer, female    DOB: [birth year], 65 y.o.   MRN: [id number]  HPI Here with her husband for 3 days of symptoms including wekaness, headaches, sweats, shaky spells, chest pains, nausea (she vomited once), and low pulse rates. At home her pulse rate has been as low as 43 and is often in the 50's. She used to wear her oxygen intermittently but for the past 3 days she has worn it continuously. She is scheduled to see Dr. Cathren Harsh (her cardiologist) tomorrow morning at 11:00. No recent medication changes. She averages only 1 or 2 Oxycodone pills a day. She is not taking a beta blocker.    Review of Systems  Constitutional:  Positive for chills, diaphoresis and fatigue.  Respiratory:  Positive for shortness of breath. Negative for cough and wheezing.   Cardiovascular:  Positive for chest pain. Negative for palpitations.  Gastrointestinal:  Positive for nausea and vomiting. Negative for abdominal distention, abdominal pain, blood in stool, constipation and diarrhea.  Genitourinary: Negative.       Objective:   Physical Exam Constitutional:      General: She is not in acute distress.    Appearance: Normal appearance.     Comments: Wearing her oxygen at 3 liters of flow   Cardiovascular:     Rate and Rhythm: Normal rate and regular rhythm.     Pulses: Normal pulses.     Heart sounds: Normal heart sounds.     Comments: EKG today shows sinus bradycardia  Pulmonary:     Effort: Pulmonary effort is normal.     Breath sounds: Normal breath sounds.  Musculoskeletal:     Right lower leg: No edema.     Left lower leg: No edema.  Neurological:     Mental Status: She is alert.          Assessment & Plan:  She has had a few days of bradycardia and generalized symptoms as above. It is not clear what the etiology my be. We will send her for labs this morning and go from there. She will see Cardiology tomorrow also  We spent a total of ( 38  )  minutes reviewing records and discussing these issues.  Gershon Crane, MD

## 2020-12-04 NOTE — Addendum Note (Signed)
Addended by: Kandra Nicolas on: 12/04/2020 10:15 AM   Modules accepted: Orders

## 2020-12-05 ENCOUNTER — Ambulatory Visit (INDEPENDENT_AMBULATORY_CARE_PROVIDER_SITE_OTHER): Payer: Medicare HMO

## 2020-12-05 ENCOUNTER — Encounter (HOSPITAL_BASED_OUTPATIENT_CLINIC_OR_DEPARTMENT_OTHER): Payer: Self-pay | Admitting: Family

## 2020-12-05 ENCOUNTER — Ambulatory Visit (HOSPITAL_BASED_OUTPATIENT_CLINIC_OR_DEPARTMENT_OTHER): Payer: Medicare HMO | Admitting: Family

## 2020-12-05 ENCOUNTER — Ambulatory Visit (HOSPITAL_BASED_OUTPATIENT_CLINIC_OR_DEPARTMENT_OTHER)
Admission: RE | Admit: 2020-12-05 | Discharge: 2020-12-05 | Disposition: A | Discharge status: Home / Self Care | Payer: Medicare HMO | Source: Ambulatory Visit | Attending: Family | Admitting: Family

## 2020-12-05 VITALS — BP 144/84 | HR 64 | Ht 65.0 in | Wt 199.0 lb

## 2020-12-05 DIAGNOSIS — R112 Nausea with vomiting, unspecified: Secondary | ICD-10-CM | POA: Diagnosis not present

## 2020-12-05 DIAGNOSIS — J449 Chronic obstructive pulmonary disease, unspecified: Secondary | ICD-10-CM | POA: Diagnosis not present

## 2020-12-05 DIAGNOSIS — R0600 Dyspnea, unspecified: Secondary | ICD-10-CM | POA: Insufficient documentation

## 2020-12-05 DIAGNOSIS — R55 Syncope and collapse: Secondary | ICD-10-CM

## 2020-12-05 DIAGNOSIS — Z86711 Personal history of pulmonary embolism: Secondary | ICD-10-CM | POA: Diagnosis not present

## 2020-12-05 DIAGNOSIS — R0609 Other forms of dyspnea: Secondary | ICD-10-CM

## 2020-12-05 DIAGNOSIS — R001 Bradycardia, unspecified: Secondary | ICD-10-CM

## 2020-12-05 DIAGNOSIS — R002 Palpitations: Secondary | ICD-10-CM | POA: Diagnosis not present

## 2020-12-05 DIAGNOSIS — R0602 Shortness of breath: Secondary | ICD-10-CM | POA: Diagnosis not present

## 2020-12-05 DIAGNOSIS — D6859 Other primary thrombophilia: Secondary | ICD-10-CM | POA: Diagnosis not present

## 2020-12-05 NOTE — Progress Notes (Signed)
Office Visit    Patient Name: Laurie Schaefer Date of Encounter: 12/05/2020  PCP:  Laurey Morale, MD   [facility]  Cardiologist:  Evalina Field, MD  Advanced Practice Provider:  No care team member to display Electrophysiologist:  None     Chief Complaint    Laurie Schaefer is a 65 y.o. female with a hx of COPD on chronic O2, fibromyalgia, PE, hypertension, bipolar disorder, depression presents today for bradycardia.    Past Medical History    Past Medical History:  Diagnosis Date   Arthritis    Bipolar disorder ([place])    Chronic neck pain    had previously seen Dr. Sandi Mariscal at [facility]    COPD (chronic obstructive pulmonary disease) [facility])    sees Dr. Lamonte Sakai    Depression    Fibromyalgia    Headache    Hypertension    Past Surgical History:  Procedure Laterality Date   FRACTURE SURGERY      Allergies  Allergies  Allergen Reactions   Codeine Nausea Only    History of Present Illness    Laurie Schaefer is a 65 y.o. female with a hx of COPD on chronic O2, fibromyalgia, PE, hypertension, bipolar disorder, depression last seen 01/25/2020 by Dr. Davina Poke.  Previous echocardiogram 08/2017 with LVEF 45-36%, grade 1 diastolic dysfunction, no significant valvular abnormalities.  She was seen in consult by Dr. Davina Poke 01/2020 after referral by pulmonology for tachycardia.  She had been admitted earlier that month with PE with CT demonstrating no coronary calcium.  Her EKG demonstrated sinus tachycardia 114 bpm with no acute changes.  She reported rapid heartbeat sensation with exertion and after inhaler use.  She wore a ZIO monitor showing predominantly normal sinus rhythm with average heart rate 68 bpm, PAC/PVC less than 1%.  There were 6 patient triggered episodes that concerned with sinus tachycardia 105-137 bpm.  She contacted the office 12/03/2020 noting bradycardia with heart rate in the 50s, feeling sick, headache,  shortness of breath, nausea and vomiting x1, headache, cramping in her hands, shaking violently.  She was recommended for ED evaluation but she did refused.  She was seen by her primary care provider yesterday with heart rate improved to 66.  Lab work with K3.6, NA 140, creatinine 0.71, AST 16, ALT 12, WBC 8.8, hemoglobin 13.6, TSH 1.13.  She presents today for follow-up. She and gentleman with her in clinic today are both very out of sorts during our visit due to being late.  She has a myriad of concerns and is overall difficult to get a clear history.  Tells me four days ago she had an event where she started shaking and sweating all over per her report. Her heart rate dropped to 43 bpm by her pulse oximeter. Tells me she has been sick with a headache, nausea, shallow breathing. She reports she has been wearing her oxygen daily since that time but does not have it on initially during our conversation with O2 93% on room air.  Discernable "feeling bad" since that time. Her heart rate has been maintaining in the 50s per her home readings by pulse oximeter. Endorses both lightheadedness and dizziness with near syncope. She reports running a fever but has not checked temperature at home, tells me she can "just tell". Tells me her chest discomfort "general ache" all over her chest at rest and with activity. Tells me she feels "exhausted all the time". She  wonders if she is in the "latter throes" of COPD but unsure why. Has not seen pulmonology in >1 year. Denies palpitations but tells me her heart feels "sluggish". She reports clear mucus with cough. No wheeze. She reports cramping in her arms and legs.   Tells me she "grazes" during the day but no formal meal times. Drinks water and gingerale throughout the day but probably only 2.5 glasses. Also endorses headache when she gets up and moves, is on her feet too long. This has been ongoing for months. Reports compliance with Imitrex.  She takes half tablet of  Eliquis in the morning, half tablet later in the morning, half tablet in the afternoon, and half tablet in the evening.  Reports nausea with full tablet. We discuss need to take as prescribed.  EKGs/Labs/Other Studies Reviewed:   The following studies were reviewed today:  Echo 08/25/17 Left ventricle:  The cavity size was normal. Wall thickness was  increased in a pattern of moderate LVH. Systolic function was  vigorous. The estimated ejection fraction was in the range of 65%  to 70%. Wall motion was normal; there were no regional wall motion  abnormalities. Doppler parameters are consistent with abnormal left  ventricular relaxation (grade 1 diastolic dysfunction).   -------------------------------------------------------------------  Aortic valve:   Trileaflet; normal thickness leaflets. Mobility was  not restricted.  Doppler:  Transvalvular velocity was within the  normal range. There was no stenosis. There was no regurgitation.    -------------------------------------------------------------------  Aorta: Aortic root: The aortic root was normal in size.   -------------------------------------------------------------------  Mitral valve:   Structurally normal valve.   Mobility was not  restricted.  Doppler:  Transvalvular velocity was within the normal  range. There was no evidence for stenosis. There was no  regurgitation.   -------------------------------------------------------------------  Left atrium:  The atrium was at the upper limits of normal in size.    -------------------------------------------------------------------  Right ventricle:  The cavity size was normal. Wall thickness was  normal. Systolic function was normal.   -------------------------------------------------------------------  Pulmonic valve:   Poorly visualized.  Structurally normal valve.  Cusp separation was normal.  Doppler:  Transvalvular velocity was  within the normal range. There was no  evidence for stenosis. There  was no regurgitation.   -------------------------------------------------------------------  Tricuspid valve:   Structurally normal valve.    Doppler:  Transvalvular velocity was within the normal range. There was no  regurgitation.   -------------------------------------------------------------------  Pulmonary artery:   The main pulmonary artery was normal-sized.  Systolic pressure was within the normal range.   -------------------------------------------------------------------  Right atrium:  The atrium was normal in size.   -------------------------------------------------------------------  Pericardium: There was no pericardial effusion.   -------------------------------------------------------------------  Systemic veins:  Inferior vena cava: The vessel was normal in size.   Long term monitor 02/27/20 Enrollment 01/29/2020-02/01/2020 (2 days 20 hours). Patient had a min HR of 68 bpm (normal sinus rhythm), max HR of 161 bpm (sinus tachycardia), and avg HR of 96 bpm (normal sinus rhythm). Predominant underlying rhythm was Sinus Rhythm. Isolated SVEs were rare (<1.0%), and no SVE Couplets or SVE Triplets were present. Isolated VEs were rare (<1.0%), and no VE Couplets or VE Triplets were present. No diary submitted.    There were 6 patient triggered events that coincided with sinus tachycardia 105-137 bpm.    Impression:   1. No arrhythmias detected.  2. Rare ectopy.   EKG:  No EKG is  ordered today.  The ekg independently reviewed from 12/04/20  demonstrated SB 52 bpm with no acute ST/ T wave changes and normal intervals (PR 146, QTc 413).  Recent Labs: 12/04/2020: ALT 12; BUN 8; Creatinine, Ser 0.71; Hemoglobin 13.6; Platelets 273.0; Potassium 3.6; Sodium 140; TSH 1.13  Recent Lipid Panel    Component Value Date/Time   CHOL 191 07/12/2019 1203   TRIG 240.0 (H) 07/12/2019 1203   HDL 39.50 07/12/2019 1203   CHOLHDL 5 07/12/2019 1203   VLDL  48.0 (H) 07/12/2019 1203   LDLDIRECT 116.0 07/12/2019 1203   Home Medications   Current Meds  Medication Sig   albuterol (VENTOLIN HFA) 108 (90 Base) MCG/ACT inhaler INHALE 2 PUFFS INTO THE LUNGS EVERY 6 HOURS AS NEEDED FOR WHEEZING OR SHORTNESS OF BREATH (Patient taking differently: Inhale 2 puffs into the lungs every 6 (six) hours as needed for shortness of breath.)   amLODipine (NORVASC) 5 MG tablet Take 1 tablet (5 mg total) by mouth daily.   amoxicillin (AMOXIL) 500 MG tablet Take 1 tablet by mouth 3 times per day.   apixaban (ELIQUIS) 5 MG TABS tablet Take 1 tablet (5 mg total) by mouth 2 (two) times daily.   ARIPiprazole (ABILIFY) 10 MG tablet TAKE 1 TABLET(10 MG) BY MOUTH DAILY   budesonide-formoterol (SYMBICORT) 160-4.5 MCG/ACT inhaler Inhale 2 puffs into the lungs 2 (two) times daily.   buPROPion (WELLBUTRIN XL) 150 MG 24 hr tablet Take 1 tablet (150 mg total) by mouth daily.   cholecalciferol (VITAMIN D) 1000 units tablet Take 1,000 Units by mouth daily.   Fexofenadine HCl (MUCINEX ALLERGY PO) Take 1 tablet by mouth daily as needed (allergies/congestion.).    fluticasone (FLONASE) 50 MCG/ACT nasal spray Place 2 sprays into both nostrils daily. (Patient taking differently: Place 2 sprays into both nostrils daily as needed for allergies.)   levalbuterol (XOPENEX) 0.63 MG/3ML nebulizer solution INHALE 3ML BY MOUTH VIA NEBULIZER EVERY 4 HOURS AS NEEDED FOR WHEEZE OR SHORTNESS OF BREATH (Patient taking differently: Take 0.63 mg by nebulization every 6 (six) hours as needed for shortness of breath.)   LORazepam (ATIVAN) 2 MG tablet TAKE 1 TABLET(2 MG) BY MOUTH EVERY 6 HOURS AS NEEDED FOR ANXIETY   losartan-hydrochlorothiazide (HYZAAR) 100-25 MG tablet Take 1 tablet by mouth daily.   methocarbamol (ROBAXIN) 500 MG tablet TAKE 1 TABLET (500 MG TOTAL) BY MOUTH EVERY EIGHT HOURS AS NEEDED FOR MUSCLE SPASMS.   [START ON 01/01/2021] oxycodone (ROXICODONE) 30 MG immediate release tablet Take 1  tablet (30 mg total) by mouth every 6 (six) hours as needed for pain.   OXYGEN 3lpm with sleep and exertion if needed   pantoprazole (PROTONIX) 40 MG tablet Take 1 tablet (40 mg total) by mouth daily.   prazosin (MINIPRESS) 2 MG capsule Take 1 capsule (2 mg total) by mouth at bedtime.   pregabalin (LYRICA) 50 MG capsule Take 1 capsule (50 mg total) by mouth at bedtime.   QUEtiapine (SEROQUEL) 50 MG tablet Take 1 tablet (50 mg total) by mouth 2 (two) times daily. (Patient taking differently: Take 50 mg by mouth 2 (two) times daily. Pt takes 1 tablet once a day.)   SUMAtriptan (IMITREX) 100 MG tablet Take 1 tablet (100 mg total) by mouth as needed for migraine. May repeat in 2 hours if headache persists or recurs.   Tiotropium Bromide Monohydrate (SPIRIVA RESPIMAT) 2.5 MCG/ACT AERS Inhale 2 puffs into the lungs daily.   traZODone (DESYREL) 100 MG tablet TAKE 1 TABLET(100 MG) BY MOUTH AT BEDTIME   zolpidem (AMBIEN) 10 MG  tablet Take 1 tablet (10 mg total) by mouth at bedtime as needed for sleep.     Review of Systems      Review of Systems  Constitutional: Positive for chills, diaphoresis, fever ("felt fever") and malaise/fatigue.  Cardiovascular:  Positive for chest pain, dyspnea on exertion, irregular heartbeat ("slow heart rate"), palpitations and syncope ("near syncope"). Negative for leg swelling, near-syncope and orthopnea.  Respiratory:  Positive for cough and shortness of breath. Negative for sputum production and wheezing.   Gastrointestinal:  Negative for melena, nausea and vomiting.  Genitourinary:  Negative for hematuria.  Neurological:  Positive for dizziness, headaches, light-headedness and weakness.   All other systems reviewed and are otherwise negative except as noted above.  Physical Exam    VS:  BP (!) 144/84   Pulse 64   Ht _0  (1.651 m)   Wt 199 lb (90.3 kg)   SpO2 93%   BMI 33.12 kg/m  , BMI Body mass index is 33.12 kg/m.  Wt Readings from Last 3 Encounters:   12/05/20 199 lb (90.3 kg)  12/04/20 198 lb 2 oz (89.9 kg)  07/09/20 205 lb (93 kg)     GEN: Well nourished, overweight, well developed, in no acute distress. HEENT: normal. Neck: Supple, no JVD, carotid bruits, or masses. Cardiac: RRR, no murmurs, rubs, or gallops. No clubbing, cyanosis, edema.  Radials/PT 2+ and equal bilaterally.  Respiratory:  Respirations regular and unlabored, clear to auscultation bilaterally. GI: Soft, nontender, nondistended. MS: No deformity or atrophy. Skin: Warm and dry, no rash. Neuro:  Strength and sensation are intact. Anxious.  Psych: Normal affect.  Assessment & Plan    Sinus bradycardia / Lightheadedness / Dizziness / Near syncope / Fatigue - EKG yesterday by PCP SB 52 bpm. Labs yesterday normal renal function, liver function, electrolytes, and thyroid function. Previously with tachycardia. Not on AV nodal blocking agent. Plan for 14 day live telemetry monitor given near syncope, lightheadedness, dizziness, marked bradycardia.  Concern previously noted rare PAC/PVC could be making her home readings with pulse oximetry and accurate.  If symptomatic bradycardia noted will likely require PPM. She does take a number of CNS depressing agents including antipsychotics and opioid pain medications, concern these may be contributory to her symptoms. She is on multiple medications which are on [place] Criteria.   Chills / Reported fever / "Feeling sick" - Unclear etiology. Reports home COVID test negative. CBC yesterday with normal WBC. Continued follow up with primary care encouraged.   COPD / Dyspnea - CXR ordered today. Overdue for follow up with pulmonology and encouraged to schedule. She worries she is in the "end throes" of COPD but cannot explain why  Hx of PE on chronic anticoagulation - Denies bleeding complications. CBC 12/04/20 with no anemia. Continue management per PCP.   Hypertension - BP elevated. Notably anxious today likely etiology. Was well  controlled at PCP visit yesterday. Continue current antihypertensive regimen.   Of note, on her return from chest x-ray to have her live monitor placed she was tearful and panicky.  She did have her home 2 L of O2 on at the time.  Pulse oximeter with heart rate 62 bpm and O2 98%.  We talked through deep breathing exercises and she was able to calm down. She continues to decline ED evaluation.  Disposition: Follow up in 6 week(s) with Dr. Audie Box or APP.  Signed, Loel Dubonnet, NP 12/05/2020, 11:37 AM [place]

## 2020-12-05 NOTE — Patient Instructions (Addendum)
Medication Instructions:  Continue your current medications.   *If you need a refill on your cardiac medications before your next appointment, please call your pharmacy*  Lab Work: None ordered today.   Testing/Procedures: A chest x-ray takes a picture of the organs and structures inside the chest, including the heart, lungs, and blood vessels. This test can show several things, including, whether the heart is enlarges; whether fluid is building up in the lungs; and whether pacemaker / defibrillator leads are still in place.   Follow-Up: At [facility], you and your health needs are our priority.  As part of our continuing mission to provide you with exceptional heart care, we have created designated Provider Care Teams.  These Care Teams include your primary Cardiologist (physician) and Advanced Practice Providers (APPs -  Physician Assistants and Nurse Practitioners) who all work together to provide you with the care you need, when you need it.  We recommend signing up for the patient portal called "MyChart".  Sign up information is provided on this After Visit Summary.  MyChart is used to connect with patients for Virtual Visits (Telemedicine).  Patients are able to view lab/test results, encounter notes, upcoming appointments, etc.  Non-urgent messages can be sent to your provider as well.   To learn more about what you can do with MyChart, go to [email or web address].    Your next appointment:   6 week(s)  The format for your next appointment:   In Person  Provider:   You may see Reatha Harps, MD or one of the following Advanced Practice Providers on your designated Care Team:   Azalee Course, PA-C Micah Flesher, [place] or  Judy Pimple, [place]   Other Instructions  Your physician has recommended that you wear a Zio AT Live monitor.   This monitor is a medical device that records the heart's electrical activity. Doctors most often use these monitors to diagnose arrhythmias.  Arrhythmias are problems with the speed or rhythm of the heartbeat. The monitor is a small device applied to your chest. You can wear one while you do your normal daily activities. While wearing this monitor if you have any symptoms to push the button and record what you felt. Once you have worn this monitor for the period of time provider prescribed (Usually 14 days), you will return the monitor device in the postage paid box/bag. Once it is returned they will download the data collected and provide [place] with a report which the provider will then review and we will call you with those results.   Important tips:  Avoid showering during the first 24 hours of wearing the monitor. Avoid excessive sweating to help maximize wear time. Do not submerge the device, no hot tubs, and no swimming pools. Keep any lotions or oils away from the patch. After 24 hours you may shower with the patch on. Take brief showers with your back facing the shower head.  Do not remove patch once it has been placed because that will interrupt data and decrease adhesive wear time. Push the button when you have any symptoms and write down what you were feeling. Once you have completed wearing your monitor, remove and place into box which has postage paid and place in your outgoing mailbox.  If for some reason you have misplaced your box then call our office and we can provide another box and/or mail it off for you. Keep the transmitter within 10 feet at all times.  Expect a welcome phone call  within 24-48 hrs of application from [place].  This call will include your copay information, so please answer any unknown phone calls while wearing Zio (it could also be important information about your heart) The envelope to return Zio is in the back of the transmitter. Removal instructions are on the last page of the symptom diary.  Place the patch sticky side up inside the transmitter and the symptom diary inside the envelope to return on your  last wear day inside your mailbox or any [employer]PS mailbox.

## 2020-12-06 DIAGNOSIS — R55 Syncope and collapse: Secondary | ICD-10-CM | POA: Diagnosis not present

## 2020-12-13 ENCOUNTER — Telehealth: Payer: Self-pay | Admitting: Cardiovascular Disease

## 2020-12-13 NOTE — Telephone Encounter (Signed)
[employer] called about patients monitor

## 2020-12-13 NOTE — Telephone Encounter (Signed)
Spoke with Jonny Ruiz at [facility]. 14 day monitor was only wore for 7 days. Pt thought she was to wear for 7 days. I messaged Gillian Shields, NP. She reviewed the live monitor report and states " Just 7 days is fine. There were no significant pauses nor bradycardia reported by the live monitor. no need for repeat." John made aware of her recommendation.

## 2021-02-10 ENCOUNTER — Other Ambulatory Visit: Payer: Self-pay | Admitting: Family Medicine

## 2021-02-10 ENCOUNTER — Telehealth: Payer: Self-pay | Admitting: Family Medicine

## 2021-02-10 NOTE — Telephone Encounter (Signed)
Patient called needing a refill of her oxycodone sent to [id number] - [place], [place] - [address]  Patient would like a call at [phone].  Please advise.

## 2021-02-11 NOTE — Telephone Encounter (Signed)
She needs a PMV 

## 2021-02-11 NOTE — Telephone Encounter (Signed)
Pt LOV was 12/04/2020 Last refill done on 01/01/2021 for 120 tablets Please advise

## 2021-02-12 NOTE — Telephone Encounter (Signed)
Spoke with pt scheduled MyChart video visit on 02/17/2021

## 2021-02-17 ENCOUNTER — Encounter: Payer: Self-pay | Admitting: Family Medicine

## 2021-02-17 ENCOUNTER — Telehealth (INDEPENDENT_AMBULATORY_CARE_PROVIDER_SITE_OTHER): Payer: Medicare HMO | Admitting: Family Medicine

## 2021-02-17 ENCOUNTER — Telehealth: Payer: Self-pay | Admitting: Family

## 2021-02-17 DIAGNOSIS — M797 Fibromyalgia: Secondary | ICD-10-CM | POA: Diagnosis not present

## 2021-02-17 DIAGNOSIS — F1190 Opioid use, unspecified, uncomplicated: Secondary | ICD-10-CM

## 2021-02-17 MED ORDER — APIXABAN 5 MG PO TABS: 5.0000 mg | ORAL_TABLET | Freq: Two times a day (BID) | ORAL | 3 refills | Status: DC

## 2021-02-17 MED ORDER — OXYCODONE HCL 30 MG PO TABS: 30.0000 mg | ORAL_TABLET | ORAL | 0 refills | Status: AC | PRN

## 2021-02-17 MED ORDER — OXYCODONE HCL 30 MG PO TABS: 30.0000 mg | ORAL_TABLET | ORAL | 0 refills | Status: DC | PRN

## 2021-02-17 NOTE — Telephone Encounter (Signed)
Patient called and mentioned how she was unhappy due to trying to call for 3 hrs. Also stated unhappiness about appointment time. Told patient closest appointment was on 03/21/20 with Hubbard Hartshorn. Patient got really upset and mentioned that she has to get something sooner. Tried to put patient on cancellation list but patient refused saying that no one calls

## 2021-02-17 NOTE — Progress Notes (Signed)
   Subjective:    Patient ID: Laurie Schaefer, female    DOB: Jun 23, 1955, 65 y.o.   MRN: [id number]  HPI Virtual Visit via Telephone Note  I connected with the patient on 02/17/21 at 10:45 AM EST by telephone and verified that I am speaking with the correct person using two identifiers.   I discussed the limitations, risks, security and privacy concerns of performing an evaluation and management service by telephone and the availability of in person appointments. I also discussed with the patient that there may be a patient responsible charge related to this service. The patient expressed understanding and agreed to proceed.  Location patient: home Location provider: work or home office Participants present for the call: patient, provider Patient did not have a visit in the prior 7 days to address this/these issue(s).   History of Present Illness: Here for pain management. She has been taking up to  a day in order to get pain relief, and of course this causes her to run out early.    Observations/Objective: Patient sounds cheerful and well on the phone. I do not appreciate any SOB. Speech and thought processing are grossly intact. Patient reported vitals:  Assessment and Plan: Pain management. Indication for chronic opioid: fibromyalgia Medication and dose: Oxycodone 30 mg # pills per month: 180 Last UDS date: 01-15-20 Opioid Treatment Agreement signed (Y/N): 01-15-20 Opioid Treatment Agreement last reviewed with patient:  02-17-21 [facility] reviewed this encounter (include red flags): Yes We will increase the pills to take every 4 hours as needed. Gershon Crane, MD   Follow Up Instructions:     [phone] 11-20 [phone] 21-30 I did not refer this patient for an OV in the next 24 hours for this/these issue(s).  I discussed the assessment and treatment plan with the patient. The patient was provided an opportunity to ask questions and all were answered. The patient  agreed with the plan and demonstrated an understanding of the instructions.   The patient was advised to call back or seek an in-person evaluation if the symptoms worsen or if the condition fails to improve as anticipated.  I provided 18 minutes of non-face-to-face time during this encounter.   Gershon Crane, MD     Review of Systems     Objective:   Physical Exam        Assessment & Plan:

## 2021-02-18 NOTE — Telephone Encounter (Signed)
Attempted to return pts. Call about getting an appointment, as the schedulers told pt. The earliest appointment time with Gillian Shields, NP is 1/13. No voicemail set up for patient, will also send a mychart message

## 2021-02-18 NOTE — Progress Notes (Addendum)
Cardiology Office Note:    Date:  02/19/2021   ID:  Matisha, Termine Jul 29, 1955, MRN [id number]  PCP:  Nelwyn Salisbury, MD   [facility] Providers Cardiologist:  Reatha Harps, MD      Referring MD: Nelwyn Salisbury, MD   Follow-up for essential hypertension, palpitations, and anticoagulation.  History of Present Illness:    Catalea Labrecque is a 65 y.o. female with a hx of essential hypertension, pulmonary embolism, COPD, chronic respiratory failure, insomnia, bipolar, obesity, fibromyalgia, fatigue, and chest wall pain.  Her echocardiogram 6/19 showed an LVEF of 65-70%, G1 DD, and no significant valvular abnormalities.  She was seen by Dr. Flora Lipps on 01/25/2020.  During that time she was being referred by pulmonology for an evaluation of tachycardia.  She had been admitted earlier in the month with PE and CT demonstrated no coronary calcium.  Her EKG showed tachycardia 114 bpm with no acute changes.  She reported a sensation of a rapid heartbeat after inhaler use.  She wore a cardiac event monitor which showed predominantly sinus rhythm with an average heart rate of 68 bpm.  She was also noted to have PACs and PVCs.  She had 6 triggered events which showed sinus tachycardia.  She contacted the nurse triage line on 12/03/2020.  She noted bradycardia with heart rates in the 50s.  She was feeling sick, headaches, shortness of breath nausea, vomiting 1 time and noted shaking with cramping in her hands.  It was recommended that she present to the emergency department for evaluation but she refused.  She was seen by her primary care provider and her heart rate improved to 66.  Lab work showed potassium of 3.6, sodium of 140 creatinine 0.7 1.  White blood cell count 8.8 and hemoglobin of 13.6.  Her TSH was 1.13  She was seen by Gillian Shields, NP-C on 12/05/2020.  They were somewhat out of sorts during their visit due to late arrival.  She had several concerns and it was difficult to  obtain a clear history.  She reported that 4 days ago she had an event where she started shaking and sweating.  Her heart rate dropped to 43 according to her pulse oximeter.  Her oxygen saturation on room air was 93%.  Since that time she reported that her heart rate stayed in the 50s.  She did note lightheadedness dizziness and near syncope.  She also felt that she had general achiness.  She denied palpitations but reported that she felt sluggish.  She reported a cough that produced clear mucus.  She denied wheezing and she noted cramping in her arms or legs.  She was not eating or drinking well.  She also reported taking half tablets of her apixaban in the morning and half tablet later in the morning.  She was taking 1/2 tablet in the afternoon and half tablet in the evening.  She noted nausea if she would take a full tablet.  Taking medication as prescribed was discussed.  The cardiac event monitor was ordered and showed sinus rhythm with an average heart rate of 96 bpm isolated SVE's less than 1% and no arrhythmias.  She presents to the clinic today for follow-up evaluation states in September she stopped taking her apixaban.  She reports that around 2 weeks ago she had a fall while she was walking at the park.  She did not fall completely to the ground and she caught herself.  After that time she  noticed pain in her left leg behind her knee and at the top of her calf.  She was concerned that she may develop another pulmonary embolus and went back on her apixaban at that time.  She has been taking her apixaban 3 times per day.  She reported that the apixaban medication makes her feel better.  She also indicated that she has intermittent periods of chest discomfort throughout the day which is unchanged.  She notices pain for minutes to hours at rest in the evening which is relieved by laying back.  She also indicated that she has intermittent periods of chest discomfort while she was vacuuming or standing  doing dishes.  We reviewed her cardiac event monitor and she expressed understanding.  We reviewed the importance of taking her apixaban as directed.  I will order lower extremity venous Dopplers, ABIs, lower extremity arterial's, and a D-dimer.  We will have her follow-up after studies.  Today she denies chest pain, shortness of breath, lower extremity edema, fatigue, palpitations, melena, hematuria, hemoptysis, diaphoresis, weakness, presyncope, syncope, orthopnea, and PND.   Past Medical History:  Diagnosis Date   Arthritis    Bipolar disorder ([place])    Chronic neck pain    had previously seen Dr. Sandi Mariscal at [facility]    COPD (chronic obstructive pulmonary disease) [facility])    sees Dr. Lamonte Sakai    Depression    Fibromyalgia    Headache    Hypertension     Past Surgical History:  Procedure Laterality Date   FRACTURE SURGERY      Current Medications: Current Meds  Medication Sig   albuterol (VENTOLIN HFA) 108 (90 Base) MCG/ACT inhaler INHALE 2 PUFFS INTO THE LUNGS EVERY 6 HOURS AS NEEDED FOR WHEEZING OR SHORTNESS OF BREATH (Patient taking differently: Inhale 2 puffs into the lungs every 6 (six) hours as needed for shortness of breath.)   amLODipine (NORVASC) 5 MG tablet Take 1 tablet (5 mg total) by mouth daily.   apixaban (ELIQUIS) 5 MG TABS tablet Take 1 tablet (5 mg total) by mouth 2 (two) times daily. (Patient taking differently: Take 5 mg by mouth in the morning, at noon, and at bedtime.)   ARIPiprazole (ABILIFY) 10 MG tablet TAKE 1 TABLET(10 MG) BY MOUTH DAILY   budesonide-formoterol (SYMBICORT) 160-4.5 MCG/ACT inhaler Inhale 2 puffs into the lungs 2 (two) times daily. (Patient taking differently: Inhale 2 puffs into the lungs daily.)   buPROPion (WELLBUTRIN XL) 150 MG 24 hr tablet Take 1 tablet (150 mg total) by mouth daily.   levalbuterol (XOPENEX) 0.63 MG/3ML nebulizer solution INHALE 3ML BY MOUTH VIA NEBULIZER EVERY 4 HOURS AS NEEDED FOR WHEEZE OR SHORTNESS OF BREATH  (Patient taking differently: Take 0.63 mg by nebulization every 6 (six) hours as needed for shortness of breath.)   LORazepam (ATIVAN) 2 MG tablet TAKE 1 TABLET(2 MG) BY MOUTH EVERY 6 HOURS AS NEEDED FOR ANXIETY   losartan-hydrochlorothiazide (HYZAAR) 100-25 MG tablet Take 1 tablet by mouth daily.   [START ON 04/20/2021] oxycodone (ROXICODONE) 30 MG immediate release tablet Take 1 tablet (30 mg total) by mouth every 4 (four) hours as needed for pain.   OXYGEN 3lpm with sleep and exertion if needed   pantoprazole (PROTONIX) 40 MG tablet Take 1 tablet (40 mg total) by mouth daily.   prazosin (MINIPRESS) 2 MG capsule Take 1 capsule (2 mg total) by mouth at bedtime.   pregabalin (LYRICA) 50 MG capsule Take 1 capsule (50 mg total) by mouth  at bedtime. (Patient taking differently: Take 50 mg by mouth daily as needed.)   SUMAtriptan (IMITREX) 100 MG tablet Take 1 tablet (100 mg total) by mouth as needed for migraine. May repeat in 2 hours if headache persists or recurs.   Tiotropium Bromide Monohydrate (SPIRIVA RESPIMAT) 2.5 MCG/ACT AERS Inhale 2 puffs into the lungs daily. (Patient taking differently: Inhale 2 puffs into the lungs daily as needed.)   zolpidem (AMBIEN) 10 MG tablet Take 1 tablet (10 mg total) by mouth at bedtime as needed for sleep.     Allergies:   Codeine   Social History   Socioeconomic History   Marital status: Single    Spouse name: Not on file   Number of children: 0   Years of education: Not on file   Highest education level: Not on file  Occupational History   Not on file  Tobacco Use   Smoking status: Never   Smokeless tobacco: Never  Substance and Sexual Activity   Alcohol use: No    Alcohol/week: 0.0 standard drinks   Drug use: No   Sexual activity: Not on file  Other Topics Concern   Not on file  Social History Narrative   Divorced, [place] 1   No children   States she's been to graduate school    Social Determinants of Health   Financial Resource Strain: Low  Risk    Difficulty of Paying Living Expenses: Not hard at all  Food Insecurity: No Food Insecurity   Worried About Charity fundraiser in the Last Year: Never true   Arboriculturist in the Last Year: Never true  Transportation Needs: No Transportation Needs   Lack of Transportation (Medical): No   Lack of Transportation (Non-Medical): No  Physical Activity: Inactive   Days of Exercise per Week: 0 days   Minutes of Exercise per Session: 0 min  Stress: No Stress Concern Present   Feeling of Stress : Not at all  Social Connections: Socially Isolated   Frequency of Communication with Friends and Family: Three times a week   Frequency of Social Gatherings with Friends and Family: Three times a week   Attends Religious Services: Never   Active Member of Clubs or Organizations: No   Attends Archivist Meetings: Never   Marital Status: Divorced     Family History: The patient's family history includes Depression in her mother; Hypertension in her mother; Stroke in her father.  ROS:   Please see the history of present illness.     All other systems reviewed and are negative.   Risk Assessment/Calculations:           Physical Exam:    VS:  BP 124/88    Pulse (!) 105    Ht 5\' 5"  (1.651 m)    Wt 195 lb (88.5 kg)    BMI 32.45 kg/m     Wt Readings from Last 3 Encounters:  02/19/21 195 lb (88.5 kg)  12/05/20 199 lb (90.3 kg)  12/04/20 198 lb 2 oz (89.9 kg)     GEN:  Well nourished, well developed in no acute distress HEENT: Normal NECK: No JVD; No carotid bruits LYMPHATICS: No lymphadenopathy CARDIAC: RRR, no murmurs, rubs, gallops RESPIRATORY:  Clear to auscultation without rales, wheezing or rhonchi  ABDOMEN: Soft, non-tender, non-distended MUSCULOSKELETAL:  No edema; No deformity  SKIN: Warm and dry NEUROLOGIC:  Alert and oriented x 3 PSYCHIATRIC:  Normal affect    EKGs/Labs/Other Studies Reviewed:  The following studies were reviewed  today:  Echocardiogram 08/25/2017 Study Conclusions   - Left ventricle: The cavity size was normal. Wall thickness was    increased in a pattern of moderate LVH. Systolic function was    vigorous. The estimated ejection fraction was in the range of 65%    to 70%. Wall motion was normal; there were no regional wall    motion abnormalities. Doppler parameters are consistent with    abnormal left ventricular relaxation (grade 1 diastolic    dysfunction).   Cardiac event monitor 01/29/2020 Enrollment 01/29/2020-02/01/2020 (2 days 20 hours). Patient had a min HR of 68 bpm (normal sinus rhythm), max HR of 161 bpm (sinus tachycardia), and avg HR of 96 bpm (normal sinus rhythm). Predominant underlying rhythm was Sinus Rhythm. Isolated SVEs were rare (<1.0%), and no SVE Couplets or SVE Triplets were present. Isolated VEs were rare (<1.0%), and no VE Couplets or VE Triplets were present. No diary submitted.    There were 6 patient triggered events that coincided with sinus tachycardia 105-137 bpm.    Impression:   1. No arrhythmias detected.  2. Rare ectopy.   EKG: None today.  Recent Labs: 12/04/2020: ALT 12; BUN 8; Creatinine, Ser 0.71; Hemoglobin 13.6; Platelets 273.0; Potassium 3.6; Sodium 140; TSH 1.13  Recent Lipid Panel    Component Value Date/Time   CHOL 191 07/12/2019 1203   TRIG 240.0 (H) 07/12/2019 1203   HDL 39.50 07/12/2019 1203   CHOLHDL 5 07/12/2019 1203   VLDL 48.0 (H) 07/12/2019 1203   LDLDIRECT 116.0 07/12/2019 1203    ASSESSMENT & PLAN    Left lower extremity pain-reports a fall 2 weeks ago.  She started taking apixaban 3 times daily due to concern for repeat PE.  She had stopped her apixaban in September.  On exam she had no swelling or erythema.  Lower extremities equal bilaterally.  Discussed the importance of taking apixaban as directed.  It appears to be musculoskeletal in nature and there is low suspicion for lower extremity DVT.  We will order lower extremity  venous Dopplers, lower extremity arterial's, and ABIs given her history to rule out.  Order D-dimer.  Chest discomfort-reports intermittent episodes of chest discomfort.  Atypical in nature.  Appears to be related to anxiety versus reflux. No plans for ischemic evaluation at this time.  Palpitations- reports intermittent episodes of brief and irregular heartbeats.  Cardiac event monitor showed normal sinus rhythm with no arrhythmias detected and rare ectopy.  On apixaban for history of PE. Heart healthy low-sodium diet-salty 6 given Increase physical activity as tolerated Avoid triggers caffeine, chocolate, EtOH, dehydration etc.  Essential hypertension-BP today 124/88.  Well-controlled at home. Continue amlodipine, losartan, HCTZ Heart healthy low-sodium diet-salty 6 given Increase physical activity as tolerated  COPD/dyspnea-follows with pulmonology.  No increased DOE or increased work of breathing. Continue Flonase, Spiriva Follow-up pulmonology  History of pulmonary embolus-reports compliance with apixaban.  Denies bleeding issues. Continue apixaban  Disposition: Follow-up with Dr. Audie Box in 4-6 weeks .       Medication Adjustments/Labs and Tests Ordered: Current medicines are reviewed at length with the patient today.  Concerns regarding medicines are outlined above.  Orders Placed This Encounter  Procedures   VAS [place] ABI WITH/WO TBI   VAS [place] LOWER EXTREMITY VENOUS (DVT)   VAS [place] LOWER EXTREMITY ARTERIAL DUPLEX   No orders of the defined types were placed in this encounter.   Patient Instructions  Medication Instructions:  Your physician has  recommended you make the following change in your medication:   Change: please take your Eliquis twice daily instead of three times per day    *If you need a refill on your cardiac medications before your next appointment, please call your pharmacy*   Lab Work: None ordered today   Testing/Procedures: Your physician has  requested that you have a lower extremity venous duplex. This test is an ultrasound of the veins in the legs or arms. It looks at venous blood flow that carries blood from the heart to the legs or arms. Allow one hour for a Lower Venous exam. Allow thirty minutes for an Upper Venous exam. There are no restrictions or special instructions.  Your physician has requested that you have a lower extremity arterial duplex. This test is an ultrasound of the arteries in the legs or arms. It looks at arterial blood flow in the legs and arms. Allow one hour for Lower and Upper Arterial scans. There are no restrictions or special instructions  Your physician has requested that you have an ankle brachial index (ABI). During this test an ultrasound and blood pressure cuff are used to evaluate the arteries that supply the arms and legs with blood. Allow thirty minutes for this exam. There are no restrictions or special instructions.     Follow-Up: At [facility], you and your health needs are our priority.  As part of our continuing mission to provide you with exceptional heart care, we have created designated Provider Care Teams.  These Care Teams include your primary Cardiologist (physician) and Advanced Practice Providers (APPs -  Physician Assistants and Nurse Practitioners) who all work together to provide you with the care you need, when you need it.  We recommend signing up for the patient portal called "MyChart".  Sign up information is provided on this After Visit Summary.  MyChart is used to connect with patients for Virtual Visits (Telemedicine).  Patients are able to view lab/test results, encounter notes, upcoming appointments, etc.  Non-urgent messages can be sent to your provider as well.   To learn more about what you can do with MyChart, go to [email or web address].    Your next appointment:   2-3 week(s)  The format for your next appointment:   In Person  Provider:   Evalina Field, MD   or Annamaria Boots or Laurann Montana, NP    Other Instructions None today   Signed, Deberah Pelton, NP  02/19/2021 9:02 AM      Notice: This dictation was prepared with Dragon dictation along with smaller phrase technology. Any transcriptional errors that result from this process are unintentional and may not be corrected upon review.  I spent 14 minutes examining this patient, reviewing medications, and using patient centered shared decision making involving her cardiac care.  Prior to her visit I spent greater than 20 minutes reviewing her past medical history,  medications, and prior cardiac tests.

## 2021-02-18 NOTE — Telephone Encounter (Signed)
Appointment made by scheduler 12/14 with Edd Fabian, NP

## 2021-02-18 NOTE — Telephone Encounter (Signed)
Reattempts to call patient in regards to setting up an appointment and got no answer. Voicemail not set up

## 2021-02-19 ENCOUNTER — Ambulatory Visit (HOSPITAL_COMMUNITY)
Admission: RE | Admit: 2021-02-19 | Discharge: 2021-02-19 | Disposition: A | Discharge status: Home / Self Care | Payer: Medicare HMO | Source: Ambulatory Visit | Attending: Cardiology | Admitting: Cardiology

## 2021-02-19 ENCOUNTER — Encounter (HOSPITAL_BASED_OUTPATIENT_CLINIC_OR_DEPARTMENT_OTHER): Payer: Self-pay | Admitting: General Practice

## 2021-02-19 ENCOUNTER — Encounter (HOSPITAL_BASED_OUTPATIENT_CLINIC_OR_DEPARTMENT_OTHER): Payer: Self-pay

## 2021-02-19 ENCOUNTER — Telehealth (HOSPITAL_BASED_OUTPATIENT_CLINIC_OR_DEPARTMENT_OTHER): Payer: Self-pay | Admitting: General Practice

## 2021-02-19 ENCOUNTER — Ambulatory Visit (HOSPITAL_BASED_OUTPATIENT_CLINIC_OR_DEPARTMENT_OTHER): Payer: Medicare HMO | Admitting: General Practice

## 2021-02-19 ENCOUNTER — Other Ambulatory Visit (HOSPITAL_BASED_OUTPATIENT_CLINIC_OR_DEPARTMENT_OTHER): Payer: Self-pay | Admitting: General Practice

## 2021-02-19 ENCOUNTER — Other Ambulatory Visit: Payer: Self-pay

## 2021-02-19 VITALS — BP 124/88 | HR 95 | Ht 65.0 in | Wt 195.0 lb

## 2021-02-19 DIAGNOSIS — Z86711 Personal history of pulmonary embolism: Secondary | ICD-10-CM

## 2021-02-19 DIAGNOSIS — R0609 Other forms of dyspnea: Secondary | ICD-10-CM

## 2021-02-19 DIAGNOSIS — R002 Palpitations: Secondary | ICD-10-CM

## 2021-02-19 DIAGNOSIS — J449 Chronic obstructive pulmonary disease, unspecified: Secondary | ICD-10-CM

## 2021-02-19 DIAGNOSIS — I10 Essential (primary) hypertension: Secondary | ICD-10-CM | POA: Diagnosis not present

## 2021-02-19 DIAGNOSIS — M79605 Pain in left leg: Secondary | ICD-10-CM

## 2021-02-19 NOTE — Telephone Encounter (Signed)
Per Edd Fabian, NP--called and spoke to pt to let her know that she does not have a DVT or Baker's Cyst. Informed her that she will need to have blood work done today or tomorrow.  Patient stated she could not do blood work today, but she will go to the [employer]. Pt verbalized thanks for the call.  Labs faxed to [facility] office by Rutherford Guys, RN.

## 2021-02-19 NOTE — Addendum Note (Signed)
Addended by: Marlene Lard on: 02/19/2021 10:08 AM   Modules accepted: Orders

## 2021-02-19 NOTE — Patient Instructions (Signed)
Medication Instructions:  Your physician has recommended you make the following change in your medication:   Change: please take your Eliquis twice daily instead of three times per day    *If you need a refill on your cardiac medications before your next appointment, please call your pharmacy*   Lab Work: None ordered today   Testing/Procedures: Your physician has requested that you have a lower extremity venous duplex. This test is an ultrasound of the veins in the legs or arms. It looks at venous blood flow that carries blood from the heart to the legs or arms. Allow one hour for a Lower Venous exam. Allow thirty minutes for an Upper Venous exam. There are no restrictions or special instructions.  Your physician has requested that you have a lower extremity arterial duplex. This test is an ultrasound of the arteries in the legs or arms. It looks at arterial blood flow in the legs and arms. Allow one hour for Lower and Upper Arterial scans. There are no restrictions or special instructions  Your physician has requested that you have an ankle brachial index (ABI). During this test an ultrasound and blood pressure cuff are used to evaluate the arteries that supply the arms and legs with blood. Allow thirty minutes for this exam. There are no restrictions or special instructions.     Follow-Up: At [facility], you and your health needs are our priority.  As part of our continuing mission to provide you with exceptional heart care, we have created designated Provider Care Teams.  These Care Teams include your primary Cardiologist (physician) and Advanced Practice Providers (APPs -  Physician Assistants and Nurse Practitioners) who all work together to provide you with the care you need, when you need it.  We recommend signing up for the patient portal called "MyChart".  Sign up information is provided on this After Visit Summary.  MyChart is used to connect with patients for Virtual Visits  (Telemedicine).  Patients are able to view lab/test results, encounter notes, upcoming appointments, etc.  Non-urgent messages can be sent to your provider as well.   To learn more about what you can do with MyChart, go to [email or web address].    Your next appointment:   2-3 week(s)  The format for your next appointment:   In Person  Provider:   Reatha Harps, MD  or Scheryl Marten or Gillian Shields, NP    Other Instructions None today

## 2021-02-20 ENCOUNTER — Emergency Department (HOSPITAL_COMMUNITY): Payer: Medicare HMO

## 2021-02-20 DIAGNOSIS — Z86711 Personal history of pulmonary embolism: Secondary | ICD-10-CM | POA: Diagnosis not present

## 2021-02-20 NOTE — Telephone Encounter (Signed)
Patient calling to find out why she needs to have the lab work done.

## 2021-02-20 NOTE — Telephone Encounter (Signed)
Called patient, advised of blood work that is on file.  Patient verbalized understanding.

## 2021-02-21 ENCOUNTER — Other Ambulatory Visit: Payer: Self-pay

## 2021-02-21 ENCOUNTER — Emergency Department (HOSPITAL_COMMUNITY)
Admission: EM | Admit: 2021-02-21 | Discharge: 2021-02-21 | Disposition: A | Discharge status: Home / Self Care | Payer: Medicare HMO | Attending: Emergency Medicine | Admitting: Emergency Medicine

## 2021-02-21 ENCOUNTER — Encounter (HOSPITAL_COMMUNITY): Payer: Self-pay

## 2021-02-21 ENCOUNTER — Emergency Department (HOSPITAL_COMMUNITY): Payer: Medicare HMO

## 2021-02-21 ENCOUNTER — Emergency Department (HOSPITAL_BASED_OUTPATIENT_CLINIC_OR_DEPARTMENT_OTHER): Payer: Medicare HMO

## 2021-02-21 DIAGNOSIS — I10 Essential (primary) hypertension: Secondary | ICD-10-CM | POA: Insufficient documentation

## 2021-02-21 DIAGNOSIS — Z7901 Long term (current) use of anticoagulants: Secondary | ICD-10-CM | POA: Diagnosis not present

## 2021-02-21 DIAGNOSIS — R079 Chest pain, unspecified: Secondary | ICD-10-CM | POA: Diagnosis not present

## 2021-02-21 DIAGNOSIS — J449 Chronic obstructive pulmonary disease, unspecified: Secondary | ICD-10-CM | POA: Insufficient documentation

## 2021-02-21 DIAGNOSIS — Z79899 Other long term (current) drug therapy: Secondary | ICD-10-CM | POA: Insufficient documentation

## 2021-02-21 DIAGNOSIS — R7989 Other specified abnormal findings of blood chemistry: Secondary | ICD-10-CM | POA: Diagnosis not present

## 2021-02-21 DIAGNOSIS — M79605 Pain in left leg: Secondary | ICD-10-CM | POA: Diagnosis not present

## 2021-02-21 DIAGNOSIS — M79662 Pain in left lower leg: Secondary | ICD-10-CM | POA: Diagnosis not present

## 2021-02-21 LAB — CBC WITH DIFFERENTIAL/PLATELET
Abs Immature Granulocytes: 0.02 10*3/uL (ref 0.00–0.07)
Basophils Absolute: 0.1 10*3/uL (ref 0.0–0.1)
Basophils Relative: 1 %
Eosinophils Absolute: 0.3 10*3/uL (ref 0.0–0.5)
Eosinophils Relative: 4 %
HCT: 43.3 % (ref 36.0–46.0)
Hemoglobin: 14.7 g/dL (ref 12.0–15.0)
Immature Granulocytes: 0 %
Lymphocytes Relative: 33 %
Lymphs Abs: 2.6 10*3/uL (ref 0.7–4.0)
MCH: 28.7 pg (ref 26.0–34.0)
MCHC: 33.9 g/dL (ref 30.0–36.0)
MCV: 84.6 fL (ref 80.0–100.0)
Monocytes Absolute: 0.8 10*3/uL (ref 0.1–1.0)
Monocytes Relative: 10 %
Neutro Abs: 4.2 10*3/uL (ref 1.7–7.7)
Neutrophils Relative %: 52 %
Platelets: 297 10*3/uL (ref 150–400)
RBC: 5.12 MIL/uL — ABNORMAL HIGH (ref 3.87–5.11)
RDW: 13.6 % (ref 11.5–15.5)
WBC: 8.1 10*3/uL (ref 4.0–10.5)
nRBC: 0 % (ref 0.0–0.2)

## 2021-02-21 LAB — BASIC METABOLIC PANEL
Anion gap: 10 (ref 5–15)
BUN: 9 mg/dL (ref 8–23)
CO2: 23 mmol/L (ref 22–32)
Calcium: 8.9 mg/dL (ref 8.9–10.3)
Chloride: 106 mmol/L (ref 98–111)
Creatinine, Ser: 0.71 mg/dL (ref 0.44–1.00)
GFR, Estimated: >60 mL/min (ref >60)
Glucose, Bld: 110 mg/dL — ABNORMAL HIGH (ref 70–99)
Potassium: 3.4 mmol/L — ABNORMAL LOW (ref 3.5–5.1)
Sodium: 139 mmol/L (ref 135–145)

## 2021-02-21 LAB — TROPONIN I (HIGH SENSITIVITY): Troponin I (High Sensitivity): 5 ng/L (ref <18)

## 2021-02-21 LAB — D-DIMER, QUANTITATIVE: D-DIMER: 0.71 mg/L FEU — ABNORMAL HIGH (ref 0.00–0.49)

## 2021-02-21 MED ORDER — METHOCARBAMOL 500 MG PO TABS: 500.0000 mg | ORAL_TABLET | Freq: Three times a day (TID) | ORAL | 0 refills | Status: DC | PRN

## 2021-02-21 NOTE — Progress Notes (Signed)
Orthopedic Tech Progress Note Patient Details:  Laurie Schaefer 07/29/1955 [id number]  Ortho Devices Type of Ortho Device: Knee Immobilizer Ortho Device/Splint Location: Left knee Ortho Device/Splint Interventions: Application   Post Interventions Patient Tolerated: Well  Genelle Bal Yolette Hastings 02/21/2021, 8:56 PM

## 2021-02-21 NOTE — ED Notes (Signed)
Patient was called to triage #4 for a vascular study. Patient picked up her belongings, but left a bag in a chair and asked another patient to watch her stuff because she was saving a seat for her boyfriend. Patient instructed that a seat could not be saved for her boyfriend and if a patient came and needed the seat then they would have priority over the saved seat. Patient was upset with the writer and was not nice. Explained to the patient that patients came first. Witnessed by [place], [place].

## 2021-02-21 NOTE — Discharge Instructions (Signed)
Follow-up with orthopedic surgery.  There is not a blood clot in that left leg.  It appears as if there could be a tendon or ligamentous injury since it began acutely and you are tender to a specific spot.  The muscle laxer may help.  Follow-up with orthopedic surgery as needed.

## 2021-02-21 NOTE — ED Provider Notes (Signed)
[facility]-EMERGENCY DEPT Provider Note   CSN: [id number] Arrival date & time: 02/21/21  1035     History Chief Complaint  Patient presents with   Abnormal Lab    Laurie Schaefer is a 65 y.o. female.   Abnormal Lab Patient sent in for possible DVT.  Reportedly pain in her left leg.  Has had for around 10 days now.  Had outpatient Doppler that was negative.  Had D-dimer done that was elevated.  Reportedly called and told to come into the ER.  Swelling in the leg has been there since she stepped and felt a sudden pain behind her left knee.  States it hurts to press it.  States it is been hurting her at night and having difficulty sleeping because of it.  States the leg is more swollen.  Previous history of pulmonary embolism.  Has been on her anticoagulation for about a month now.  She had been off it somewhat before that.  States she does feel shortness of breath at times.  No fevers.  No real chest pain.    Past Medical History:  Diagnosis Date   Arthritis    Bipolar disorder (HCC)    Chronic neck pain    had previously seen Dr. Salli Real at [facility]    COPD (chronic obstructive pulmonary disease) [facility])    sees Dr. Delton Coombes    Depression    Fibromyalgia    Headache    Hypertension     Patient Active Problem List   Diagnosis Date Noted   Acute pulmonary embolism (HCC) 01/16/2020   Pulmonary embolism (HCC) 01/15/2020   Chest wall pain 12/08/2018   Noncompliance 12/08/2018   COPD with acute exacerbation (HCC) 02/04/2018   Fatigue 12/08/2017   Atelectasis 11/25/2017   Cough variant asthma vs uacs/vcd 07/18/2017   DOE (dyspnea on exertion) 07/16/2017   Fibromyalgia 05/03/2017   Migraines 04/12/2017   Chronic respiratory failure with hypoxia (HCC) 04/01/2017   Allergic rhinitis 03/13/2016   Vocal cord dysfunction 01/29/2016   COPD (chronic obstructive pulmonary disease) (HCC) 12/19/2015   Obesity 12/19/2015   Bipolar I disorder (HCC)  08/27/2015   INSOMNIA, CHRONIC 11/03/2006   Depression 11/03/2006   Essential hypertension 11/03/2006   MENOPAUSAL SYNDROME 11/03/2006   Osteoarthritis 11/03/2006    Past Surgical History:  Procedure Laterality Date   FRACTURE SURGERY       OB History   No obstetric history on file.     Family History  Problem Relation Age of Onset   Depression Mother    Hypertension Mother    Stroke Father     Social History   Tobacco Use   Smoking status: Never   Smokeless tobacco: Never  Vaping Use   Vaping Use: Never used  Substance Use Topics   Alcohol use: No    Alcohol/week: 0.0 standard drinks   Drug use: No    Home Medications Prior to Admission medications   Medication Sig Start Date End Date Taking? Authorizing Provider  albuterol (VENTOLIN HFA) 108 (90 Base) MCG/ACT inhaler INHALE 2 PUFFS INTO THE LUNGS EVERY 6 HOURS AS NEEDED FOR WHEEZING OR SHORTNESS OF BREATH Patient taking differently: Inhale 2 puffs into the lungs every 6 (six) hours as needed for shortness of breath. 05/30/19   Parrett, Tammy S, NP  amLODipine (NORVASC) 5 MG tablet Take 1 tablet (5 mg total) by mouth daily. 02/13/19   Nelwyn Salisbury, MD  apixaban (ELIQUIS) 5 MG TABS tablet  Take 1 tablet (5 mg total) by mouth 2 (two) times daily. Patient taking differently: Take 5 mg by mouth in the morning, at noon, and at bedtime. 02/17/21   Nelwyn Salisbury, MD  ARIPiprazole (ABILIFY) 10 MG tablet TAKE 1 TABLET(10 MG) BY MOUTH DAILY 09/26/20   Burchette, Elberta Fortis, MD  budesonide-formoterol (SYMBICORT) 160-4.5 MCG/ACT inhaler Inhale 2 puffs into the lungs 2 (two) times daily. Patient taking differently: Inhale 2 puffs into the lungs daily. 05/30/19   Parrett, Virgel Bouquet, NP  buPROPion (WELLBUTRIN XL) 150 MG 24 hr tablet Take 1 tablet (150 mg total) by mouth daily. 03/26/20   Nelwyn Salisbury, MD  levalbuterol (XOPENEX) 0.63 MG/3ML nebulizer solution INHALE BY MOUTH VIA NEBULIZER EVERY 4 HOURS AS NEEDED FOR WHEEZE OR  SHORTNESS OF BREATH Patient taking differently: Take 0.63 mg by nebulization every 6 (six) hours as needed for shortness of breath. 06/12/19   Parrett, Virgel Bouquet, NP  LORazepam (ATIVAN) 2 MG tablet TAKE 1 TABLET(2 MG) BY MOUTH EVERY 6 HOURS AS NEEDED FOR ANXIETY 11/01/20   Nelwyn Salisbury, MD  losartan-hydrochlorothiazide (HYZAAR) 100-25 MG tablet Take 1 tablet by mouth daily. 05/08/20   Nelwyn Salisbury, MD  methocarbamol (ROBAXIN) 500 MG tablet Take 1 tablet (500 mg total) by mouth every 8 (eight) hours as needed for muscle spasms. 02/21/21   Benjiman Core, MD  oxycodone (ROXICODONE) 30 MG immediate release tablet Take 1 tablet (30 mg total) by mouth every 4 (four) hours as needed for pain. 04/20/21 05/20/21  Nelwyn Salisbury, MD  OXYGEN 3lpm with sleep and exertion if needed    [provider]  pantoprazole (PROTONIX) 40 MG tablet Take 1 tablet (40 mg total) by mouth daily. 07/12/19   Nelwyn Salisbury, MD  prazosin (MINIPRESS) 2 MG capsule Take 1 capsule (2 mg total) by mouth at bedtime. 05/15/20   Nelwyn Salisbury, MD  pregabalin (LYRICA) 50 MG capsule Take 1 capsule (50 mg total) by mouth at bedtime. Patient taking differently: Take 50 mg by mouth daily as needed. 11/01/20   Nelwyn Salisbury, MD  SUMAtriptan (IMITREX) 100 MG tablet Take 1 tablet (100 mg total) by mouth as needed for migraine. May repeat in 2 hours if headache persists or recurs. 07/09/20   Nelwyn Salisbury, MD  Tiotropium Bromide Monohydrate (SPIRIVA RESPIMAT) 2.5 MCG/ACT AERS Inhale 2 puffs into the lungs daily. Patient taking differently: Inhale 2 puffs into the lungs daily as needed. 05/30/19   Parrett, Virgel Bouquet, NP  zolpidem (AMBIEN) 10 MG tablet Take 1 tablet (10 mg total) by mouth at bedtime as needed for sleep. 11/01/20   Nelwyn Salisbury, MD    Allergies    Codeine  Review of Systems   Review of Systems  Constitutional:  Negative for diaphoresis.  HENT:  Negative for congestion.   Respiratory:  Negative for cough.    Gastrointestinal:  Negative for abdominal pain.  Genitourinary:  Negative for flank pain.  Musculoskeletal:  Negative for back pain.       Left knee pain.  Skin:  Negative for rash.  Neurological:  Negative for syncope.   Physical Exam Updated Vital Signs BP (!) 147/107 (BP Location: Left Arm)    Pulse (!) 109    Temp 98 F (36.7 C) (Oral)    Resp 16    Ht  (1.651 m)    Wt 77.1 kg    SpO2 100%    BMI 28.29 kg/m  Physical Exam Vitals and nursing note reviewed.  Eyes:     Pupils: Pupils are equal, round, and reactive to light.  Cardiovascular:     Rate and Rhythm: Normal rate.  Pulmonary:     Breath sounds: No wheezing or rhonchi.  Chest:     Chest wall: No tenderness.  Abdominal:     Tenderness: There is no abdominal tenderness.  Musculoskeletal:        General: Tenderness present.     Cervical back: Neck supple.     Comments: Tenderness over posterior knee medially on left.  Pulse intact in foot. mild edema.  Knee stable.  No effusion.  Neurological:     Mental Status: She is alert.    ED Results / Procedures / Treatments   Labs (all labs ordered are listed, but only abnormal results are displayed) Labs Reviewed  CBC WITH DIFFERENTIAL/PLATELET - Abnormal; Notable for the following components:      Result Value   RBC 5.12 (*)    All other components within normal limits  BASIC METABOLIC PANEL - Abnormal; Notable for the following components:   Potassium 3.4 (*)    Glucose, Bld 110 (*)    All other components within normal limits  TROPONIN I (HIGH SENSITIVITY)  TROPONIN I (HIGH SENSITIVITY)    EKG None  Radiology DG Chest 2 View  Result Date: 02/21/2021 CLINICAL DATA:  Chest pain. EXAM: CHEST - 2 VIEW COMPARISON:  12/05/2020 FINDINGS: The cardiomediastinal silhouette is unchanged with normal heart size. Minimal left mid lung scarring is unchanged. No acute airspace consolidation, edema, pleural effusion, pneumothorax is identified. Mild thoracic  dextroscoliosis is noted. IMPRESSION: No active cardiopulmonary disease. Electronically Signed   By: Sebastian Ache M.D.   On: 02/21/2021 13:13   VAS [place] LOWER EXTREMITY VENOUS (DVT) (7a-7p)  Result Date: 02/21/2021  Lower Venous DVT Study Patient Name:  [facility]  Date of Exam:   02/21/2021 Medical Rec #: [id number]            Accession #:    [id number] Date of Birth: 06/07/1955           Patient Gender: F Patient Age:   55 years Exam Location:  [facility] Procedure:      VAS [place] LOWER EXTREMITY VENOUS (DVT) Referring Phys: Lorin Picket [place] --------------------------------------------------------------------------------  Indications: Positive d-dimer (0.71). Other Indications: Swelling x10 days. Risk Factors: HX of PE 01/2020. Anticoagulation: Eliquis. Comparison Study: Previous exam on 02/19/2021 was negative for DVT. Performing Technologist: Ernestene Mention RVT, RDMS  Examination Guidelines: A complete evaluation includes B-mode imaging, spectral Doppler, color Doppler, and power Doppler as needed of all accessible portions of each vessel. Bilateral testing is considered an integral part of a complete examination. Limited examinations for reoccurring indications may be performed as noted. The reflux portion of the exam is performed with the patient in reverse Trendelenburg.  +-----+---------------+---------+-----------+----------+--------------+  RIGHT Compressibility Phasicity Spontaneity Properties Thrombus Aging  +-----+---------------+---------+-----------+----------+--------------+  CFV   Full            Yes       Yes                                    +-----+---------------+---------+-----------+----------+--------------+   +---------+---------------+---------+-----------+----------+--------------+  LEFT      Compressibility Phasicity Spontaneity Properties Thrombus Aging  +---------+---------------+---------+-----------+----------+--------------+  CFV       Full  Yes       Yes                                     +---------+---------------+---------+-----------+----------+--------------+  SFJ       Full                                                             +---------+---------------+---------+-----------+----------+--------------+  FV Prox   Full            Yes       Yes                                    +---------+---------------+---------+-----------+----------+--------------+  FV Mid    Full            Yes       Yes                                    +---------+---------------+---------+-----------+----------+--------------+  FV Distal Full            Yes       Yes                                    +---------+---------------+---------+-----------+----------+--------------+  PFV       Full                                                             +---------+---------------+---------+-----------+----------+--------------+  POP       Full            Yes       Yes                                    +---------+---------------+---------+-----------+----------+--------------+  PTV       Full                                                             +---------+---------------+---------+-----------+----------+--------------+  PERO      Full                                                             +---------+---------------+---------+-----------+----------+--------------+    Summary: RIGHT: - No evidence of common femoral vein obstruction.  LEFT: - There is no evidence of deep vein thrombosis in the lower extremity. - There  is no evidence of superficial venous thrombosis.  - No cystic structure found in the popliteal fossa.  *See table(s) above for measurements and observations.    Preliminary     Procedures Procedures   Medications Ordered in ED Medications - No data to display  ED Course  I have reviewed the triage vital signs and the nursing notes.  Pertinent labs & imaging results that were available during my care of the patient were reviewed by me and considered in my  medical decision making (see chart for details).    MDM Rules/Calculators/A&P                         Patient with leg pain.  Began acutely.  Tenderness behind the knee.  I think most likely it is a musculoskeletal issue.  Has had venous Dopplers negative twice although her D-dimer was slightly elevated.  States she is worried about the amount of pain she been having.  Lab work reassuring.  We will treat symptomatically.  Ortho follow-up as needed.  Pulmonary embolism felt less likely without any clot in the leg.  And patient is already on anticoagulation.  Will discharge home.    Final Clinical Impression(s) / ED Diagnoses Final diagnoses:  Pain of left lower extremity    Rx / DC Orders ED Discharge Orders          Ordered    methocarbamol (ROBAXIN) 500 MG tablet  Every 8 hours PRN,   Status:  Discontinued        02/21/21 2037    methocarbamol (ROBAXIN) 500 MG tablet  Every 8 hours PRN        02/21/21 2057             Benjiman Core, MD 02/21/21 2316

## 2021-02-21 NOTE — ED Notes (Signed)
Ortho tech called at this time.  

## 2021-02-21 NOTE — ED Triage Notes (Signed)
Patient reports that [facility] and told to come to the ED for evaluation of a blood clot. Patient upset about phone call received today.  Patient has swelling of her left leg x 10 days. D-dimer -0.71.

## 2021-02-28 ENCOUNTER — Encounter (HOSPITAL_COMMUNITY): Payer: Medicare HMO

## 2021-03-04 ENCOUNTER — Encounter: Payer: Self-pay | Admitting: Family Medicine

## 2021-03-04 ENCOUNTER — Telehealth: Payer: Self-pay | Admitting: Cardiovascular Disease

## 2021-03-04 NOTE — Telephone Encounter (Signed)
Pt has some questions regarding her upcoming appt 03/07/21. Pt wants to make sure this test was not previously performed when she was recently in the ED

## 2021-03-04 NOTE — Telephone Encounter (Signed)
Tried to call x 2, no voicemail set up

## 2021-03-04 NOTE — Telephone Encounter (Signed)
Called again, no vm set up

## 2021-03-04 NOTE — Telephone Encounter (Signed)
Called again, no  VM set up

## 2021-03-06 MED ORDER — ZOLPIDEM TARTRATE 10 MG PO TABS: 10.0000 mg | ORAL_TABLET | Freq: Every evening | ORAL | 5 refills | Status: DC | PRN

## 2021-03-06 MED ORDER — LORAZEPAM 2 MG PO TABS: ORAL_TABLET | ORAL | 5 refills | Status: DC

## 2021-03-06 NOTE — Telephone Encounter (Signed)
Done

## 2021-03-06 NOTE — Telephone Encounter (Signed)
Left message on patient home phone. Called to see if  assistance is still needed , if no response by 5 pm today will close encounter . -

## 2021-03-07 ENCOUNTER — Other Ambulatory Visit: Payer: Self-pay

## 2021-03-07 ENCOUNTER — Ambulatory Visit (HOSPITAL_COMMUNITY)
Admission: RE | Admit: 2021-03-07 | Discharge: 2021-03-07 | Disposition: A | Discharge status: Home / Self Care | Payer: Medicare HMO | Source: Ambulatory Visit | Attending: Internal Medicine | Admitting: Internal Medicine

## 2021-03-07 DIAGNOSIS — M79605 Pain in left leg: Secondary | ICD-10-CM | POA: Diagnosis not present

## 2021-03-07 DIAGNOSIS — Z86711 Personal history of pulmonary embolism: Secondary | ICD-10-CM | POA: Insufficient documentation

## 2021-03-07 NOTE — Telephone Encounter (Signed)
Patient did not return call. Encounter closed.  

## 2021-03-16 NOTE — Progress Notes (Signed)
Cardiology Office Note:   Date:  03/17/2021  NAME:  Laurie Schaefer    MRN: [id number] DOB:  May 31, 1955   PCP:  Nelwyn SalisburyFry, Stephen A, MD  Cardiologist:  Reatha HarpsWesley T O'Neal, MD  Electrophysiologist:  None   Referring MD: Nelwyn SalisburyFry, Stephen A, MD   Chief Complaint  Patient presents with   Follow-up   History of Present Illness:   Laurie Schaefer is a 66 y.o. female with a hx of COPD, fibromyalgia who presents for follow-up of palpitations. Monitors have been normal.  She reports heart rates have been in the 50s.  She reports has been short of breath with this happens.  I did inform her that all of her monitors have been normal.  She has brief extra heartbeats but nothing bothersome.  Thyroid studies are normal.  She was seen for left leg pain.  D-dimer was slightly elevated and she was sent to the emergency room.  DVT study was negative.  Exam today shows trace edema but no significant findings.  She was started on Lyrica and this is improved her left leg pain.  I suspect this is neuropathy.  Echocardiogram in 2019 normal.  She continues to report chest pain.  This occurs after eating.  She reports vomiting after eating spaghetti.  I suspect this is related to acid reflux.  She remains on Protonix.  She also describes pain in her legs in addition to her chest.  She does suffer from fibromyalgia.  We discussed this is a possibility.  Cardiovascular examination is normal.  EKGs in the past have been normal.  Ambulatory ECG 12/2020: Normal 01/2020: Normal  Problem List 1. COPD on 2L -moderate to severe COPD 2. PE 01/15/2020 -0 coronary calcium 3. Fibromyalgia   Past Medical History: Past Medical History:  Diagnosis Date   Arthritis    Bipolar disorder (HCC)    Chronic neck pain    [facility]    COPD (chronic obstructive pulmonary disease) [facility](HCC)    sees Dr. Delton CoombesByrum    Depression    Fibromyalgia    Headache    Hypertension     Past Surgical  History: Past Surgical History:  Procedure Laterality Date   FRACTURE SURGERY      Current Medications: Current Meds  Medication Sig   albuterol (VENTOLIN HFA) 108 (90 Base) MCG/ACT inhaler INHALE 2 PUFFS INTO THE LUNGS EVERY 6 HOURS AS NEEDED FOR WHEEZING OR SHORTNESS OF BREATH (Patient taking differently: Inhale 2 puffs into the lungs every 6 (six) hours as needed for shortness of breath.)   amLODipine (NORVASC) 5 MG tablet Take 1 tablet (5 mg total) by mouth daily.   apixaban (ELIQUIS) 5 MG TABS tablet Take 1 tablet (5 mg total) by mouth 2 (two) times daily. (Patient taking differently: Take 5 mg by mouth in the morning, at noon, and at bedtime.)   ARIPiprazole (ABILIFY) 10 MG tablet TAKE 1 TABLET(10 MG) BY MOUTH DAILY   budesonide-formoterol (SYMBICORT) 160-4.5 MCG/ACT inhaler Inhale 2 puffs into the lungs 2 (two) times daily. (Patient taking differently: Inhale 2 puffs into the lungs daily.)   buPROPion (WELLBUTRIN XL) 150 MG 24 hr tablet Take 1 tablet (150 mg total) by mouth daily.   levalbuterol (XOPENEX) 0.63 MG/3ML nebulizer solution INHALE 3ML BY MOUTH VIA NEBULIZER EVERY 4 HOURS AS NEEDED FOR WHEEZE OR SHORTNESS OF BREATH (Patient taking differently: Take 0.63 mg by nebulization every 6 (six) hours as needed for shortness of  breath.)   LORazepam (ATIVAN) 2 MG tablet TAKE 1 TABLET(2 MG) BY MOUTH EVERY 6 HOURS AS NEEDED FOR ANXIETY   losartan-hydrochlorothiazide (HYZAAR) 100-25 MG tablet Take 1 tablet by mouth daily.   methocarbamol (ROBAXIN) 500 MG tablet Take 1 tablet (500 mg total) by mouth every 8 (eight) hours as needed for muscle spasms.   [START ON 04/20/2021] oxycodone (ROXICODONE) 30 MG immediate release tablet Take 1 tablet (30 mg total) by mouth every 4 (four) hours as needed for pain.   OXYGEN 3lpm with sleep and exertion if needed   pregabalin (LYRICA) 50 MG capsule Take 1 capsule (50 mg total) by mouth at bedtime. (Patient taking differently: Take 50 mg by mouth daily as  needed.)   SUMAtriptan (IMITREX) 100 MG tablet Take 1 tablet (100 mg total) by mouth as needed for migraine. May repeat in 2 hours if headache persists or recurs.   zolpidem (AMBIEN) 10 MG tablet Take 1 tablet (10 mg total) by mouth at bedtime as needed for sleep.     Allergies:    Lyrica [pregabalin] and Codeine   Social History: Social History   Socioeconomic History   Marital status: Single    Spouse name: Not on file   Number of children: 0   Years of education: Not on file   Highest education level: Not on file  Occupational History   Not on file  Tobacco Use   Smoking status: Never   Smokeless tobacco: Never  Vaping Use   Vaping Use: Never used  Substance and Sexual Activity   Alcohol use: No    Alcohol/week: 0.0 standard drinks   Drug use: No   Sexual activity: Not on file  Other Topics Concern   Not on file  Social History Narrative   Divorced, HH 1   No children   States she's been to graduate school    Social Determinants of Health   Financial Resource Strain: Low Risk    Difficulty of Paying Living Expenses: Not hard at all  Food Insecurity: No Food Insecurity   Worried About Programme researcher, broadcasting/film/video in the Last Year: Never true   Barista in the Last Year: Never true  Transportation Needs: No Transportation Needs   Lack of Transportation (Medical): No   Lack of Transportation (Non-Medical): No  Physical Activity: Inactive   Days of Exercise per Week: 0 days   Minutes of Exercise per Session: 0 min  Stress: No Stress Concern Present   Feeling of Stress : Not at all  Social Connections: Socially Isolated   Frequency of Communication with Friends and Family: Three times a week   Frequency of Social Gatherings with Friends and Family: Three times a week   Attends Religious Services: Never   Active Member of Clubs or Organizations: No   Attends Banker Meetings: Never   Marital Status: Divorced     Family History: The patient's family  history includes Depression in her mother; Hypertension in her mother; Stroke in her father.  ROS:   All other ROS reviewed and negative. Pertinent positives noted in the HPI.     EKGs/Labs/Other Studies Reviewed:   The following studies were personally reviewed by me today:  TTE 08/25/2017  - Left ventricle: The cavity size was normal. Wall thickness was    increased in a pattern of moderate LVH. Systolic function was    vigorous. The estimated ejection fraction was in the range of 65%    to  70%. Wall motion was normal; there were no regional wall    motion abnormalities. Doppler parameters are consistent with    abnormal left ventricular relaxation (grade 1 diastolic    dysfunction).   ABI 03/07/2021 Summary:  Right: Resting right ankle-brachial index is within normal range. No  evidence of significant right lower extremity arterial disease. The right  toe-brachial index is normal.   Left: Resting left ankle-brachial index is within normal range. No  evidence of significant left lower extremity arterial disease. The left  toe-brachial index is normal.   Recent Labs: 12/04/2020: ALT 12; TSH 1.13 02/21/2021: BUN 9; Creatinine, Ser 0.71; Hemoglobin 14.7; Platelets 297; Potassium 3.4; Sodium 139   Recent Lipid Panel    Component Value Date/Time   CHOL 191 07/12/2019 1203   TRIG 240.0 (H) 07/12/2019 1203   HDL 39.50 07/12/2019 1203   CHOLHDL 5 07/12/2019 1203   VLDL 48.0 (H) 07/12/2019 1203   LDLDIRECT 116.0 07/12/2019 1203    Physical Exam:   VS:  BP 130/78    Pulse 63    Ht 5\' 5"  (1.651 m)    Wt 191 lb 9.6 oz (86.9 kg)    SpO2 95%    BMI 31.88 kg/m    Wt Readings from Last 3 Encounters:  03/17/21 191 lb 9.6 oz (86.9 kg)  02/21/21 170 lb (77.1 kg)  02/19/21 195 lb (88.5 kg)    General: Well nourished, well developed, in no acute distress Head: Atraumatic, normal size  Eyes: PEERLA, EOMI  Neck: Supple, no JVD Endocrine: No thryomegaly Cardiac: Normal S1, S2; RRR; no  murmurs, rubs, or gallops Lungs: Clear to auscultation bilaterally, no wheezing, rhonchi or rales  Abd: Soft, nontender, no hepatomegaly  Ext: No edema, pulses 2+ Musculoskeletal: No deformities, BUE and BLE strength normal and equal Skin: Warm and dry, no rashes   Neuro: Alert and oriented to person, place, time, and situation, CNII-XII grossly intact, no focal deficits  Psych: Normal mood and affect   ASSESSMENT:   Sephira Zellman is a 66 y.o. female who presents for the following: 1. Precordial pain   2. Palpitations   3. Pain of left lower extremity     PLAN:   1. Precordial pain -She complains of noncardiac chest pain.  Worse after eating.  Symptoms are not bothersome at all.  EKGs in the past that are normal.  CT PE study in November 2021 showed no evidence of coronary calcium.  I see no need for an ischemia evaluation.  Her echocardiogram in the past has been normal.  Exam is normal.  Symptoms clearly are noncardiac.  If symptoms persist could consider coronary CTA but I see no value in this.  2. Palpitations -She complains of low heart rate episodes.  2 monitors have been normal over the past 2 years.  I see no need for other monitors.  I suspect her symptoms are anxiety related. -given normal work-up she can see December 2021 as needed.   3. Pain of left lower extremity -DVT study negative.  No evidence of edema today on exam.  Symptoms are improved with Lyrica.  I suspect this is neuropathy.  ABIs are normal.  No evidence of PAD.  Disposition: Return if symptoms worsen or fail to improve.  Medication Adjustments/Labs and Tests Ordered: Current medicines are reviewed at length with the patient today.  Concerns regarding medicines are outlined above.  No orders of the defined types were placed in this encounter.  No orders of the  defined types were placed in this encounter.   Patient Instructions  Medication Instructions:  The current medical regimen is effective;  continue  present plan and medications.  *If you need a refill on your cardiac medications before your next appointment, please call your pharmacy*  Follow-Up: At [facility], you and your health needs are our priority.  As part of our continuing mission to provide you with exceptional heart care, we have created designated Provider Care Teams.  These Care Teams include your primary Cardiologist (physician) and Advanced Practice Providers (APPs -  Physician Assistants and Nurse Practitioners) who all work together to provide you with the care you need, when you need it.  We recommend signing up for the patient portal called "MyChart".  Sign up information is provided on this After Visit Summary.  MyChart is used to connect with patients for Virtual Visits (Telemedicine).  Patients are able to view lab/test results, encounter notes, upcoming appointments, etc.  Non-urgent messages can be sent to your provider as well.   To learn more about what you can do with MyChart, go to [email or web address].    Your next appointment:   As needed  The format for your next appointment:   In Person  Provider:   Reatha HarpsWesley T O'Neal, MD       Time Spent with Patient: I have spent a total of 25 minutes with patient reviewing hospital notes, telemetry, EKGs, labs and examining the patient as well as establishing an assessment and plan that was discussed with the patient.  > 50% of time was spent in direct patient care.  Signed, Lenna GilfordWesley T. Flora Lipps'Neal, MD, [facility] [place]   [facility]  [address], [address] [place], [place] [address] [phone]  03/17/2021 5:11 PM

## 2021-03-17 ENCOUNTER — Other Ambulatory Visit: Payer: Self-pay

## 2021-03-17 ENCOUNTER — Ambulatory Visit: Payer: Medicare HMO | Admitting: Cardiovascular Disease

## 2021-03-17 ENCOUNTER — Encounter: Payer: Self-pay | Admitting: Cardiovascular Disease

## 2021-03-17 VITALS — BP 130/78 | HR 63 | Ht 65.0 in | Wt 191.6 lb

## 2021-03-17 DIAGNOSIS — M79605 Pain in left leg: Secondary | ICD-10-CM | POA: Diagnosis not present

## 2021-03-17 DIAGNOSIS — R072 Precordial pain: Secondary | ICD-10-CM | POA: Diagnosis not present

## 2021-03-17 DIAGNOSIS — R002 Palpitations: Secondary | ICD-10-CM | POA: Diagnosis not present

## 2021-03-17 NOTE — Progress Notes (Signed)
SEE RESULT NOTE 

## 2021-03-17 NOTE — Patient Instructions (Signed)
Medication Instructions:  °The current medical regimen is effective;  continue present plan and medications. ° °*If you need a refill on your cardiac medications before your next appointment, please call your pharmacy* ° ° °Follow-Up: °At [facility], you and your health needs are our priority.  As part of our continuing mission to provide you with exceptional heart care, we have created designated Provider Care Teams.  These Care Teams include your primary Cardiologist (physician) and Advanced Practice Providers (APPs -  Physician Assistants and Nurse Practitioners) who all work together to provide you with the care you need, when you need it. ° °We recommend signing up for the patient portal called "MyChart".  Sign up information is provided on this After Visit Summary.  MyChart is used to connect with patients for Virtual Visits (Telemedicine).  Patients are able to view lab/test results, encounter notes, upcoming appointments, etc.  Non-urgent messages can be sent to your provider as well.   °To learn more about what you can do with MyChart, go to [email or web address].   ° °Your next appointment:   °As needed ° °The format for your next appointment:   °In Person ° °Provider:   °Sellers T O'Neal, MD   ° ° °

## 2021-03-19 ENCOUNTER — Other Ambulatory Visit: Payer: Self-pay

## 2021-03-19 ENCOUNTER — Encounter: Payer: Self-pay | Admitting: Acute Care

## 2021-03-19 ENCOUNTER — Ambulatory Visit (INDEPENDENT_AMBULATORY_CARE_PROVIDER_SITE_OTHER): Payer: Medicare HMO | Admitting: Acute Care

## 2021-03-19 VITALS — BP 116/70 | HR 63 | Temp 99.1°F | Ht 65.0 in | Wt 192.6 lb

## 2021-03-19 DIAGNOSIS — G600 Hereditary motor and sensory neuropathy: Secondary | ICD-10-CM

## 2021-03-19 DIAGNOSIS — R112 Nausea with vomiting, unspecified: Secondary | ICD-10-CM

## 2021-03-19 DIAGNOSIS — R634 Abnormal weight loss: Secondary | ICD-10-CM | POA: Diagnosis not present

## 2021-03-19 DIAGNOSIS — J441 Chronic obstructive pulmonary disease with (acute) exacerbation: Secondary | ICD-10-CM | POA: Diagnosis not present

## 2021-03-19 DIAGNOSIS — N390 Urinary tract infection, site not specified: Secondary | ICD-10-CM | POA: Diagnosis not present

## 2021-03-19 MED ORDER — ALBUTEROL SULFATE HFA 108 (90 BASE) MCG/ACT IN AERS
2.0000 | INHALATION_SPRAY | Freq: Four times a day (QID) | RESPIRATORY_TRACT | 2 refills | Status: DC | PRN
Start: 2021-03-19 — End: 2021-04-22

## 2021-03-19 MED ORDER — SULFAMETHOXAZOLE-TRIMETHOPRIM 800-160 MG PO TABS: 1.0000 | ORAL_TABLET | Freq: Two times a day (BID) | ORAL | 0 refills | Status: AC

## 2021-03-19 MED ORDER — BUDESONIDE-FORMOTEROL FUMARATE 160-4.5 MCG/ACT IN AERO: 2.0000 | INHALATION_SPRAY | Freq: Two times a day (BID) | RESPIRATORY_TRACT | 12 refills | Status: DC

## 2021-03-19 MED ORDER — SPIRIVA RESPIMAT 2.5 MCG/ACT IN AERS: 2.0000 | INHALATION_SPRAY | Freq: Every day | RESPIRATORY_TRACT | 12 refills | Status: DC

## 2021-03-19 NOTE — Patient Instructions (Addendum)
It is good to see you today. Please get your Lyrica refilled by Dr. Clent Ridges, as he initiated your therapy. We will refer you to [facility]. You are seen by Dr. Russella Dar, but we will request a female MD  to evaluate nausea and vomiting, and weight loss. I have renewed your Spiriva and Symbicort. And Albuterol. Continue to take as prescribed.  Rinse mouth after use.  We have sent in a prescription for Bactrim DS for suspected UTI.  Take until gone. Follow up with Dr. Clent Ridges is you get worse not better fir further management if your UTI.  Follow up in 6 months or sooner as needed. Note your daily symptoms > remember "red flags" for COPD:  Increase in cough, increase in sputum production, increase in shortness of breath or activity intolerance. If you notice these symptoms, please call to be seen.    Please contact office for sooner follow up if symptoms do not improve or worsen or seek emergency care

## 2021-03-19 NOTE — Progress Notes (Signed)
History of Present Illness Laurie Schaefer is a 66 y.o. female former smoker with COPD , chronic respiratory failure on nocturnal oxygen at 2 L [name]. She is followed by Dr. Melvyn Novas Maintenance Symbicort and Spiriva   03/19/2021 Pt. Presents for an acute visit. She has a temp of 99.1 and she has worsening shortness of breath. She was diagnosed yesterday with myalgias. She has been started on Lyrica for this.  She has nausea and vomiting. She has lost 26 pounds. She has a poor appetite. She is followed by GI, but hs not been as she would prefer a female physician . We will re-refer her to be seen for her nausea and vomiting.  She states she has been having bradycardia. She states that this makes her feel faint. She has had an EKG 02/21/2021 which showed rate of 92. I have talked with her about following up with cardiology for further monitoring, and I reminded her to seek emergency care for when she is symptomatic.  She was asked to go to the ED for her leg pain as she had an elevated D dimer of 0.71.  She never had the CT Angio done, a she was on Eliquis . Patient's primary care and cards determined the pain she was having in her legs  was relate to neuropathies. She has subsequently been diagnosed with neuropathies which she is now taking Lyrica to treat. ( Per Dr. Sarajane Jews) She has recently had a concussion from a fall where she hit her head and was hospitalized 01/14/2021. She had a dvt. She is now on Eliquis. She has a low grade fever. She has been urinating frequently, with burning. She thinks she has a UTI. She has frequent headaches. Dr. Sarajane Jews has given her medication for these. I have asked her  to follow up with Dr. Sarajane Jews in the morning.  She uses her Symbicort and Spiriva.She uses her rescue medication about once every 3 weeks. We have refilled prescriptions for all that she needs.  From a pulmonary standpoint she has had a stable interval since her DVT. She is compliant with her Eliquis, and she is  compliant with her inhalers and rescue medications.  Test Results:  CBC Latest Ref Rng & Units 02/21/2021 12/04/2020 05/24/2020  WBC 4.0 - 10.5 K/uL 8.1 8.8 15.0(H)  Hemoglobin 12.0 - 15.0 g/dL 14.7 13.6 13.5  Hematocrit 36.0 - 46.0 % 43.3 41.0 41.8  Platelets 150 - 400 K/uL 297 273.0 290    BMP Latest Ref Rng & Units 02/21/2021 12/04/2020 05/24/2020  Glucose 70 - 99 mg/dL 110(H) 104(H) 130(H)  BUN 8 - 23 mg/dL 9 8 14   Creatinine 0.44 - 1.00 mg/dL 0.71 0.71 0.91  Sodium 135 - 145 mmol/L 139 140 140  Potassium 3.5 - 5.1 mmol/L 3.4(L) 3.6 4.2  Chloride 98 - 111 mmol/L 106 106 101  CO2 22 - 32 mmol/L 23 27 28   Calcium 8.9 - 10.3 mg/dL 8.9 9.0 9.1    BNP    Component Value Date/Time   BNP 24.6 08/19/2017 1107    ProBNP    Component Value Date/Time   PROBNP 74.0 07/16/2017 1549    PFT    Component Value Date/Time   FEV1PRE 1.12 08/30/2017 0957   FEV1POST 1.02 08/30/2017 0957   FVCPRE 1.78 08/30/2017 0957   FVCPOST 1.84 08/30/2017 0957   TLC 4.10 08/30/2017 0957   DLCOUNC 15.88 08/30/2017 0957   PREFEV1FVCRT 63 08/30/2017 0957   PSTFEV1FVCRT 55 08/30/2017 0957  DG Chest 2 View  Result Date: 02/21/2021 CLINICAL DATA:  Chest pain. EXAM: CHEST - 2 VIEW COMPARISON:  12/05/2020 FINDINGS: The cardiomediastinal silhouette is unchanged with normal heart size. Minimal left mid lung scarring is unchanged. No acute airspace consolidation, edema, pleural effusion, pneumothorax is identified. Mild thoracic dextroscoliosis is noted. IMPRESSION: No active cardiopulmonary disease. Electronically Signed   By: Logan Bores M.D.   On: 02/21/2021 13:13   VAS [place] LOWER EXT ART SEG MULTI (SEGMENTALS & LE RAYNAUDS)  Result Date: 03/07/2021  LOWER EXTREMITY DOPPLER STUDY Patient Name:  Laurie Schaefer  Date of Exam:   03/07/2021 Medical Rec #: [id number]            Accession #:    [id number] Date of Birth: [birth year]           Patient Gender: F Patient Age:   41 years Exam Location:   [facility] Procedure:      VAS [place] LOWER EXT ART SEG MULTI (SEGMENTALS & LE RAYNAUDS) Referring Phys: JESSE CLEAVER --------------------------------------------------------------------------------  Indications: Pain of lower extremities. Patient reports bilateral leg pain;              concentrated from the knee down to her toes. This pain is moreso in              her left leg and is constant. The pain does wake her at night but              is not increased with walking. High Risk Factors: Hypertension, no history of smoking.  Comparison Study: NA Performing Technologist: Leavy Cella RDCS Supporting Technologist: Mariane Masters RVT  Examination Guidelines: A complete evaluation includes at minimum, Doppler waveform signals and systolic blood pressure reading at the level of bilateral brachial, anterior tibial, and posterior tibial arteries, when vessel segments are accessible. Bilateral testing is considered an integral part of a complete examination. Photoelectric Plethysmograph (PPG) waveforms and toe systolic pressure readings are included as required and additional duplex testing as needed. Limited examinations for reoccurring indications may be performed as noted.  ABI Findings: +---------+------------------+-----+---------+--------+  Right     Rt Pressure (mmHg) Index Waveform  Comment   +---------+------------------+-----+---------+--------+  Brachial  129                                          +---------+------------------+-----+---------+--------+  CFA                                triphasic           +---------+------------------+-----+---------+--------+  Popliteal                          triphasic           +---------+------------------+-----+---------+--------+  PTA       150                1.15  triphasic           +---------+------------------+-----+---------+--------+  PERO      145                      triphasic 1.11      +---------+------------------+-----+---------+--------+  DP         147  1.12  triphasic           +---------+------------------+-----+---------+--------+  Great Toe 94                 0.72  Normal              +---------+------------------+-----+---------+--------+ +---------+------------------+-----+---------+-------+  Left      Lt Pressure (mmHg) Index Waveform  Comment  +---------+------------------+-----+---------+-------+  Brachial  131                                         +---------+------------------+-----+---------+-------+  CFA                                triphasic          +---------+------------------+-----+---------+-------+  Popliteal                          triphasic          +---------+------------------+-----+---------+-------+  PTA       151                1.15  triphasic          +---------+------------------+-----+---------+-------+  PERO      155                      triphasic 1.18     +---------+------------------+-----+---------+-------+  DP        149                1.14  triphasic          +---------+------------------+-----+---------+-------+  Great Toe 102                0.78  Normal             +---------+------------------+-----+---------+-------+ +-------+-----------+-----------+------------+------------+  ABI/TBI Today's ABI Today's TBI Previous ABI Previous TBI  +-------+-----------+-----------+------------+------------+  Right   1.15        0.72                                   +-------+-----------+-----------+------------+------------+  Left    1.18        0.78                                   +-------+-----------+-----------+------------+------------+   Summary: Right: Resting right ankle-brachial index is within normal range. No evidence of significant right lower extremity arterial disease. The right toe-brachial index is normal. Left: Resting left ankle-brachial index is within normal range. No evidence of significant left lower extremity arterial disease. The left toe-brachial index is normal.  *See table(s) above for  measurements and observations.  Electronically signed by Jenkins Rouge MD on 03/07/2021 at 3:26:53 PM.    Final    VAS [place] LOWER EXTREMITY VENOUS (DVT) (7a-7p)  Result Date: 02/22/2021  Lower Venous DVT Study Patient Name:  [facility]  Date of Exam:   02/21/2021 Medical Rec #: [id number]            Accession #:    [id number] Date of Birth: Apr 13, 1955           Patient Gender: F Patient Age:  64 years Exam Location:  [facility] Procedure:      VAS [place] LOWER EXTREMITY VENOUS (DVT) Referring Phys: Nicki Reaper [place] --------------------------------------------------------------------------------  Indications: Positive d-dimer (0.71). Other Indications: Swelling x10 days. Risk Factors: HX of PE 01/2020. Anticoagulation: Eliquis. Comparison Study: Previous exam on 02/19/2021 was negative for DVT. Performing Technologist: Rogelia Rohrer RVT, RDMS  Examination Guidelines: A complete evaluation includes B-mode imaging, spectral Doppler, color Doppler, and power Doppler as needed of all accessible portions of each vessel. Bilateral testing is considered an integral part of a complete examination. Limited examinations for reoccurring indications may be performed as noted. The reflux portion of the exam is performed with the patient in reverse Trendelenburg.  +-----+---------------+---------+-----------+----------+--------------+  RIGHT Compressibility Phasicity Spontaneity Properties Thrombus Aging  +-----+---------------+---------+-----------+----------+--------------+  CFV   Full            Yes       Yes                                    +-----+---------------+---------+-----------+----------+--------------+   +---------+---------------+---------+-----------+----------+--------------+  LEFT      Compressibility Phasicity Spontaneity Properties Thrombus Aging  +---------+---------------+---------+-----------+----------+--------------+  CFV       Full            Yes       Yes                                     +---------+---------------+---------+-----------+----------+--------------+  SFJ       Full                                                             +---------+---------------+---------+-----------+----------+--------------+  FV Prox   Full            Yes       Yes                                    +---------+---------------+---------+-----------+----------+--------------+  FV Mid    Full            Yes       Yes                                    +---------+---------------+---------+-----------+----------+--------------+  FV Distal Full            Yes       Yes                                    +---------+---------------+---------+-----------+----------+--------------+  PFV       Full                                                             +---------+---------------+---------+-----------+----------+--------------+  POP  Full            Yes       Yes                                    +---------+---------------+---------+-----------+----------+--------------+  PTV       Full                                                             +---------+---------------+---------+-----------+----------+--------------+  PERO      Full                                                             +---------+---------------+---------+-----------+----------+--------------+     Summary: RIGHT: - No evidence of common femoral vein obstruction.  LEFT: - There is no evidence of deep vein thrombosis in the lower extremity. - There is no evidence of superficial venous thrombosis.  - No cystic structure found in the popliteal fossa.  *See table(s) above for measurements and observations. Electronically signed by Orlie Pollen on 02/22/2021 at 10:24:14 AM.    Final    VAS [place] LOWER EXTREMITY VENOUS (DVT)  Result Date: 02/19/2021  Lower Venous DVT Study Patient Name:  [facility]  Date of Exam:   02/19/2021 Medical Rec #: [id number]            Accession #:    [id number] Date of Birth: [birth year]           Patient  Gender: F Patient Age:   35 years Exam Location:  [facility] Procedure:      VAS [place] LOWER EXTREMITY VENOUS (DVT) Referring Phys: JESSE CLEAVER --------------------------------------------------------------------------------  Indications: Pain. Other Indications: Patient complains of left knee pain for several weeks. She                    denies any shortness of breath. Risk Factors: History of PE 01/2020. Anticoagulation: Eliquis. Comparison Study: NA Performing Technologist: Leavy Cella RDCS Supporting Technologist: Wilkie Aye RVT                           Patient denies any shortness of breath and chest pain.  Examination Guidelines: A complete evaluation includes B-mode imaging, spectral Doppler, color Doppler, and power Doppler as needed of all accessible portions of each vessel. Bilateral testing is considered an integral part of a complete examination. Limited examinations for reoccurring indications may be performed as noted. The reflux portion of the exam is performed with the patient in reverse Trendelenburg.  +-----+---------------+---------+-----------+----------+--------------+  RIGHT Compressibility Phasicity Spontaneity Properties Thrombus Aging  +-----+---------------+---------+-----------+----------+--------------+  CFV   Full            Yes       Yes                                    +-----+---------------+---------+-----------+----------+--------------+   +---------+---------------+---------+-----------+----------+--------------+  LEFT      Compressibility  Phasicity Spontaneity Properties Thrombus Aging  +---------+---------------+---------+-----------+----------+--------------+  CFV       Full            Yes       Yes                                    +---------+---------------+---------+-----------+----------+--------------+  SFJ       Full            Yes       Yes                                    +---------+---------------+---------+-----------+----------+--------------+  FV Prox   Full             Yes       Yes                                    +---------+---------------+---------+-----------+----------+--------------+  FV Mid    Full            Yes       Yes                                    +---------+---------------+---------+-----------+----------+--------------+  FV Distal Full            Yes       Yes                                    +---------+---------------+---------+-----------+----------+--------------+  PFV       Full                                                             +---------+---------------+---------+-----------+----------+--------------+  POP       Full            Yes       Yes                                    +---------+---------------+---------+-----------+----------+--------------+  PTV       Full            Yes       Yes                                    +---------+---------------+---------+-----------+----------+--------------+  PERO      Full            Yes       Yes                                    +---------+---------------+---------+-----------+----------+--------------+  Gastroc   Full                                                             +---------+---------------+---------+-----------+----------+--------------+  GSV       Full            Yes       Yes                                    +---------+---------------+---------+-----------+----------+--------------+     Summary: RIGHT: - No evidence of common femoral vein obstruction.  LEFT: - No evidence of deep vein thrombosis in the lower extremity. No indirect evidence of obstruction proximal to the inguinal ligament. - No cystic structure found in the popliteal fossa.  *See table(s) above for measurements and observations. Electronically signed by Quay Burow MD on 02/19/2021 at 3:28:33 PM.    Final      Past medical hx Past Medical History:  Diagnosis Date   Arthritis    Bipolar disorder ([place])    Chronic neck pain    had previously seen Dr. Sandi Mariscal at [facility]    COPD  (chronic obstructive pulmonary disease) [facility])    sees Dr. Lamonte Sakai    Depression    Fibromyalgia    Headache    Hypertension      Social History   Tobacco Use   Smoking status: Never   Smokeless tobacco: Never  Vaping Use   Vaping Use: Never used  Substance Use Topics   Alcohol use: No    Alcohol/week: 0.0 standard drinks   Drug use: No    Ms.Cliett reports that she has never smoked. She has never used smokeless tobacco. She reports that she does not drink alcohol and does not use drugs.  Tobacco Cessation: Never smoker   Past surgical hx, Family hx, Social hx all reviewed.  Current Outpatient Medications on File Prior to Visit  Medication Sig   albuterol (VENTOLIN HFA) 108 (90 Base) MCG/ACT inhaler INHALE 2 PUFFS INTO THE LUNGS EVERY 6 HOURS AS NEEDED FOR WHEEZING OR SHORTNESS OF BREATH (Patient taking differently: Inhale 2 puffs into the lungs every 6 (six) hours as needed for shortness of breath.)   amLODipine (NORVASC) 5 MG tablet Take 1 tablet (5 mg total) by mouth daily.   apixaban (ELIQUIS) 5 MG TABS tablet Take 1 tablet (5 mg total) by mouth 2 (two) times daily. (Patient taking differently: Take 5 mg by mouth in the morning, at noon, and at bedtime.)   ARIPiprazole (ABILIFY) 10 MG tablet TAKE 1 TABLET(10 MG) BY MOUTH DAILY   budesonide-formoterol (SYMBICORT) 160-4.5 MCG/ACT inhaler Inhale 2 puffs into the lungs 2 (two) times daily. (Patient taking differently: Inhale 2 puffs into the lungs daily.)   buPROPion (WELLBUTRIN XL) 150 MG 24 hr tablet Take 1 tablet (150 mg total) by mouth daily.   levalbuterol (XOPENEX) 0.63 MG/3ML nebulizer solution INHALE 3ML BY MOUTH VIA NEBULIZER EVERY 4 HOURS AS NEEDED FOR WHEEZE OR SHORTNESS OF BREATH (Patient taking differently: Take 0.63 mg by nebulization every 6 (six) hours as needed for shortness of breath.)   LORazepam (ATIVAN) 2 MG tablet TAKE 1 TABLET(2 MG) BY MOUTH EVERY 6 HOURS AS NEEDED FOR ANXIETY   losartan-hydrochlorothiazide  (HYZAAR) 100-25 MG tablet Take 1 tablet by mouth daily.   methocarbamol (ROBAXIN) 500 MG tablet Take 1 tablet (500 mg total) by mouth every 8 (eight) hours as needed for muscle spasms.   [START ON 04/20/2021] oxycodone (ROXICODONE) 30 MG immediate release tablet Take 1 tablet (30 mg total) by mouth every 4 (four) hours as needed for  pain.   OXYGEN 3lpm with sleep and exertion if needed   prazosin (MINIPRESS) 2 MG capsule Take 1 capsule (2 mg total) by mouth at bedtime.   SUMAtriptan (IMITREX) 100 MG tablet Take 1 tablet (100 mg total) by mouth as needed for migraine. May repeat in 2 hours if headache persists or recurs.   Tiotropium Bromide Monohydrate (SPIRIVA RESPIMAT) 2.5 MCG/ACT AERS Inhale 2 puffs into the lungs daily.   zolpidem (AMBIEN) 10 MG tablet Take 1 tablet (10 mg total) by mouth at bedtime as needed for sleep.   pantoprazole (PROTONIX) 40 MG tablet Take 1 tablet (40 mg total) by mouth daily. (Patient not taking: Reported on 03/17/2021)   pregabalin (LYRICA) 50 MG capsule Take 1 capsule (50 mg total) by mouth at bedtime. (Patient not taking: Reported on 03/19/2021)   No current facility-administered medications on file prior to visit.     Allergies  Allergen Reactions   Lyrica [Pregabalin] Nausea And Vomiting   Codeine Nausea Only    Review Of Systems:  Constitutional:   No  weight loss, night sweats,  Fevers, chills,+ fatigue, or  lassitude.  HEENT:   No headaches,  Difficulty swallowing,  Tooth/dental problems, or  Sore throat,                No sneezing, itching, ear ache, nasal congestion, post nasal drip,   CV:  No chest pain,  Orthopnea, PND, swelling in lower extremities, anasarca, dizziness, palpitations, syncope.   GI  No heartburn, indigestion, abdominal pain, nausea, vomiting, diarrhea, change in bowel habits, loss of appetite, bloody stools.   Resp: + baseline  shortness of breath with exertion none at rest.  No excess mucus, no productive cough,  No non-productive  cough,  No coughing up of blood.  No change in color of mucus.  No wheezing.  No chest wall deformity  Skin: no rash or lesions.  GU: + dysuria, change in color of urine, + urgency or frequency.  + flank pain, no hematuria   MS:  No joint pain or swelling.  No decreased range of motion.  No back pain.  Psych:  No change in mood or affect. No depression or anxiety.  No memory loss.   Vital Signs BP 116/70 (BP Location: Left Arm, Cuff Size: Normal)    Pulse 63    Temp 99.1 F (37.3 C) (Oral)    Ht 5\' 5"  (1.651 m)    Wt 192 lb 9.6 oz (87.4 kg)    SpO2 97%    BMI 32.05 kg/m    Physical Exam:  General- No distress,  A&Ox3, pleasant  ENT: No sinus tenderness, TM clear, pale nasal mucosa, no oral exudate,no post nasal drip, no LAN Cardiac: S1, S2, regular rate and rhythm, no murmur Chest: No wheeze/ rales/ dullness; no accessory muscle use, no nasal flaring, no sternal retractions, slightly diminished per bases.  Abd.: Soft Non-tender, ND, BS +, Body mass index is 32.05 kg/m.  Ext: No clubbing cyanosis, edema Neuro:  normal strength, MAE x 4, A&O x 3, appropriate Skin: No rashes, warm and dry, No lesions  Psych: normal mood and behavior   Assessment/Plan COPD Stable interval PE Plan   I have renewed your Spiriva and Symbicort. And Albuterol. Continue to take as prescribed.  Rinse mouth after use.  Continue Eliquis daily for PE treatment , 5 mg twice daily  Take every day without fail.  Follow up in 6 months or sooner as needed. Note your daily  symptoms > remember "red flags" for COPD:  Increase in cough, increase in sputum production, increase in shortness of breath or activity intolerance. If you notice these symptoms, please call to be seen.    Please contact office for sooner follow up if symptoms do not improve or worsen or seek emergency care    Nausea, Vomiting, Weight Loss Plan We will refer you to [facility]. You are seen by Dr. Fuller Plan, but we will request a female MD   to evaluate nausea and vomiting, and weight loss.  Neuropathies Plan Continue Lyrica per Dr. Sarajane Jews  Suspected UTI Febrile with urinary urgency and burning Plan Bactrim DS one tablet twice daily x 5 days Follow up with Dr. Sarajane Jews for further treatment   I spent 45 minutes dedicated to the care of this patient on the date of this encounter to include pre-visit review of records, face-to-face time with the patient discussing conditions above, post visit ordering of testing, clinical documentation with the electronic health record, making appropriate referrals as documented, and communicating necessary information to the patient's healthcare team.   Magdalen Spatz, NP 03/19/2021  4:23 PM

## 2021-03-24 ENCOUNTER — Encounter: Payer: Self-pay | Admitting: Family Medicine

## 2021-03-24 ENCOUNTER — Encounter: Payer: Self-pay | Admitting: Acute Care

## 2021-03-25 ENCOUNTER — Telehealth: Payer: Self-pay | Admitting: Family Medicine

## 2021-03-25 ENCOUNTER — Telehealth: Payer: Self-pay

## 2021-03-25 NOTE — Telephone Encounter (Signed)
Pt PA for oxycodone was sent  to pt pan for approval on 03/25/2021

## 2021-03-25 NOTE — Telephone Encounter (Signed)
Pt PA for Oxycodone was sent to pt plan on 03/25/2021

## 2021-03-25 NOTE — Telephone Encounter (Signed)
She already has refills until March 13. Please call her pharmacy to straighten this out

## 2021-03-25 NOTE — Telephone Encounter (Signed)
Laurie Schaefer is calling and oxycodone (ROXICODONE) 30 MG immediate release tablet needs PA [phone]

## 2021-03-25 NOTE — Telephone Encounter (Signed)
Outcome Approvedtoday for Oxycodone 30 mg PA Case: [id number], Status: Approved, Coverage Starts on: 03/09/2021 12:00:00 AM, Coverage Ends on: 03/08/2022 12:00:00 AM.

## 2021-03-26 ENCOUNTER — Telehealth (INDEPENDENT_AMBULATORY_CARE_PROVIDER_SITE_OTHER): Payer: Medicare HMO | Admitting: Family Medicine

## 2021-03-26 ENCOUNTER — Telehealth: Payer: Medicare HMO | Admitting: Family Medicine

## 2021-03-26 ENCOUNTER — Encounter: Payer: Self-pay | Admitting: Family Medicine

## 2021-03-26 DIAGNOSIS — M797 Fibromyalgia: Secondary | ICD-10-CM

## 2021-03-26 NOTE — Progress Notes (Signed)
° °  Subjective:    Patient ID: Laurie Schaefer, female    DOB: 1955-12-15, 66 y.o.   MRN: [id number]  HPI Virtual Visit via Telephone Note  I connected with the patient on 03/26/21 at 11:00 AM EST by telephone and verified that I am speaking with the correct person using two identifiers.   I discussed the limitations, risks, security and privacy concerns of performing an evaluation and management service by telephone and the availability of in person appointments. I also discussed with the patient that there may be a patient responsible charge related to this service. The patient expressed understanding and agreed to proceed.  Location patient: home Location provider: work or home office Participants present for the call: patient, provider Patient did not have a visit in the prior 7 days to address this/these issue(s).   History of Present Illness: Here asking about her pain medication. She is not due for another pain management visit until next month. She was under the impression that she was due now because she had trouble getting her last refill at the pharmacy. I explained to her that last time we had to do a PA with her insurance company. This was then approved, so she is able to fill the last RX of her Oxycodone now.    Observations/Objective: Patient sounds cheerful and well on the phone. I do not appreciate any SOB. Speech and thought processing are grossly intact. Patient reported vitals:  Assessment and Plan: Fibromyalgia. She understands that she can fill her Oxycodone any time now, and we will have another PMV next month. Alysia Penna, MD   Follow Up Instructions:     [phone] 5-10 [phone] 11-20 [id number] 21-30 I did not refer this patient for an OV in the next 24 hours for this/these issue(s).  I discussed the assessment and treatment plan with the patient. The patient was provided an opportunity to ask questions and all were answered. The patient agreed with the plan  and demonstrated an understanding of the instructions.   The patient was advised to call back or seek an in-person evaluation if the symptoms worsen or if the condition fails to improve as anticipated.  I provided 13 minutes of non-face-to-face time during this encounter.   Alysia Penna, MD     Review of Systems     Objective:   Physical Exam        Assessment & Plan:

## 2021-03-26 NOTE — Telephone Encounter (Signed)
Pt and pt pharmacy was notified of approval

## 2021-04-01 ENCOUNTER — Ambulatory Visit: Payer: Medicare HMO | Admitting: Family Medicine

## 2021-04-02 ENCOUNTER — Ambulatory Visit: Payer: Medicare HMO | Admitting: Family Medicine

## 2021-04-03 ENCOUNTER — Ambulatory Visit: Payer: Medicare HMO | Admitting: Family Medicine

## 2021-04-09 ENCOUNTER — Other Ambulatory Visit: Payer: Self-pay | Admitting: Acute Care

## 2021-04-09 DIAGNOSIS — N390 Urinary tract infection, site not specified: Secondary | ICD-10-CM

## 2021-04-11 NOTE — Telephone Encounter (Signed)
Pt needs to contact PCP

## 2021-04-20 ENCOUNTER — Encounter: Payer: Self-pay | Admitting: Family Medicine

## 2021-04-21 MED ORDER — POLYETHYLENE GLYCOL 3350 17 GM/SCOOP PO POWD: 17.0000 g | Freq: Two times a day (BID) | ORAL | 5 refills | Status: DC | PRN

## 2021-04-21 MED ORDER — PREGABALIN 200 MG PO CAPS: 200.0000 mg | ORAL_CAPSULE | Freq: Every day | ORAL | 5 refills | Status: DC

## 2021-04-21 NOTE — Telephone Encounter (Signed)
I sent in a new RX for Pregabalin and I increased the dose to 200 mg at bedtime. I also sent in a RX for polyethylene glycol (PEG)

## 2021-04-22 ENCOUNTER — Telehealth: Payer: Self-pay

## 2021-04-22 ENCOUNTER — Ambulatory Visit (INDEPENDENT_AMBULATORY_CARE_PROVIDER_SITE_OTHER): Payer: Medicare HMO | Admitting: Internal Medicine

## 2021-04-22 ENCOUNTER — Encounter: Payer: Self-pay | Admitting: Internal Medicine

## 2021-04-22 VITALS — BP 94/68 | HR 67 | Ht 65.0 in | Wt 193.0 lb

## 2021-04-22 DIAGNOSIS — R634 Abnormal weight loss: Secondary | ICD-10-CM

## 2021-04-22 DIAGNOSIS — Z1211 Encounter for screening for malignant neoplasm of colon: Secondary | ICD-10-CM | POA: Diagnosis not present

## 2021-04-22 DIAGNOSIS — K5900 Constipation, unspecified: Secondary | ICD-10-CM | POA: Diagnosis not present

## 2021-04-22 DIAGNOSIS — R1310 Dysphagia, unspecified: Secondary | ICD-10-CM | POA: Diagnosis not present

## 2021-04-22 MED ORDER — PSYLLIUM 25 % PO POWD: ORAL | 3 refills | Status: AC

## 2021-04-22 MED ORDER — POLYETHYLENE GLYCOL 3350 17 GM/SCOOP PO POWD: 17.0000 g | Freq: Two times a day (BID) | ORAL | 5 refills | Status: DC | PRN

## 2021-04-22 MED ORDER — PSYLLIUM 25 % PO POWD: 17.0000 g | Freq: Every day | ORAL | 3 refills | Status: DC

## 2021-04-22 NOTE — Patient Instructions (Addendum)
If you are age 68 or older, your body mass index should be between 23-30. Your Body mass index is 32.12 kg/m. If this is out of the aforementioned range listed, please consider follow up with your Primary Care Provider.  If you are age 37 or younger, your body mass index should be between 19-25. Your Body mass index is 32.12 kg/m. If this is out of the aformentioned range listed, please consider follow up with your Primary Care Provider.   We have sent the following medications to your pharmacy for you to pick up at your convenience: Miralax-(17 grams) once daily , Psyllium (1-2 teaspoons)- once daily.  Drink 8 cups of water daily .  You have been scheduled for a colonoscopy. Please follow written instructions given to you at your visit today.  Please pick up your prep supplies at the pharmacy within the next 1-3 days. If you use inhalers (even only as needed), please bring them with you on the day of your procedure.   The [facility] providers would like to encourage you to use [facility] to communicate with providers for non-urgent requests or questions.  Due to long hold times on the telephone, sending your provider a message by [facility] may be a faster and more efficient way to get a response.  Please allow 48 business hours for a response.  Please remember that this is for non-urgent requests.   It was a pleasure to see you today!  Thank you for trusting me with your gastrointestinal care!    Eulah Pont, MD

## 2021-04-22 NOTE — Progress Notes (Signed)
Chief Complaint: Colon cancer screening  HPI : 66 year old female with history of bipolar disorder, PE on Eliquis 01/2020, COPD, chronic neck pain, fibromyalgia presents for colon cancer screening  Her last colonoscopy was 10 years ago, done in [place], [place]. She was told that she had benign polyps at that time. She does notice occasional rectal bleeding when she has constipation issues. The rectal bleeding really occurs after she strains.  She describes the rectal bleeding is scant.  She has requested to be started on laxative therapy by her PCP. She has one BM on average once every 5 days.  She needs to go more frequently with 1 BM every 2 to 3 days.  She is still on Eliquis therapy due to history of PE, and has been told in the past that she has to be on Eliquis for the rest of her life. Denies melena. Has occasional nausea. She has lost about 25 lbs not deliberately over the last couple of months. Has had poor appetite. Has occasional trouble swallowing where solid foods will take longer go down. This dysphagia has been happening off-and-on for the last 10 months. She has never had an EGD in the past. She uses oxygen about 2-3 L as needed due to her history of COPD.   Past Medical History:  Diagnosis Date   Arthritis    Bipolar disorder (HCC)    Chronic neck pain    had previously seen Dr. Salli Real at [facility]    COPD (chronic obstructive pulmonary disease) [facility])    sees Dr. Delton Coombes    Depression    Fibromyalgia    Headache    Hypertension     Past Surgical History:  Procedure Laterality Date   COLONOSCOPY     FRACTURE SURGERY     Family History  Problem Relation Age of Onset   Depression Mother    Hypertension Mother    Stroke Father    Hypertension Father    Social History   Tobacco Use   Smoking status: Never   Smokeless tobacco: Never  Vaping Use   Vaping Use: Never used  Substance Use Topics   Alcohol use: No    Alcohol/week: 0.0 standard drinks   Drug  use: No   Current Outpatient Medications  Medication Sig Dispense Refill   albuterol (VENTOLIN HFA) 108 (90 Base) MCG/ACT inhaler INHALE 2 PUFFS INTO THE LUNGS EVERY 6 HOURS AS NEEDED FOR WHEEZING OR SHORTNESS OF BREATH (Patient taking differently: Inhale 2 puffs into the lungs every 6 (six) hours as needed for shortness of breath.) 8.5 g 3   amLODipine (NORVASC) 5 MG tablet Take 1 tablet (5 mg total) by mouth daily. 90 tablet 3   apixaban (ELIQUIS) 5 MG TABS tablet Take 1 tablet (5 mg total) by mouth 2 (two) times daily. 180 tablet 3   ARIPiprazole (ABILIFY) 10 MG tablet TAKE 1 TABLET(10 MG) BY MOUTH DAILY 30 tablet 2   budesonide-formoterol (SYMBICORT) 160-4.5 MCG/ACT inhaler Inhale 2 puffs into the lungs in the morning and at bedtime. 1 each 12   LORazepam (ATIVAN) 2 MG tablet TAKE 1 TABLET(2 MG) BY MOUTH EVERY 6 HOURS AS NEEDED FOR ANXIETY 120 tablet 5   losartan-hydrochlorothiazide (HYZAAR) 100-25 MG tablet Take 1 tablet by mouth daily. 90 tablet 3   methocarbamol (ROBAXIN) 500 MG tablet Take 1 tablet (500 mg total) by mouth every 8 (eight) hours as needed for muscle spasms. 8 tablet 0   oxycodone (ROXICODONE) 30  MG immediate release tablet Take 1 tablet (30 mg total) by mouth every 4 (four) hours as needed for pain. 180 tablet 0   OXYGEN 3lpm with sleep and exertion if needed     pantoprazole (PROTONIX) 40 MG tablet Take 1 tablet (40 mg total) by mouth daily. 90 tablet 3   polyethylene glycol powder (GLYCOLAX/MIRALAX) 17 GM/SCOOP powder Take 17 g by mouth 2 (two) times daily as needed. 3350 g 5   prazosin (MINIPRESS) 2 MG capsule Take 1 capsule (2 mg total) by mouth at bedtime. 90 capsule 3   pregabalin (LYRICA) 200 MG capsule Take 1 capsule (200 mg total) by mouth at bedtime. 30 capsule 5   SUMAtriptan (IMITREX) 100 MG tablet Take 1 tablet (100 mg total) by mouth as needed for migraine. May repeat in 2 hours if headache persists or recurs. 10 tablet 11   Tiotropium Bromide Monohydrate  (SPIRIVA RESPIMAT) 2.5 MCG/ACT AERS Inhale 2 puffs into the lungs daily. 1 each 12   zolpidem (AMBIEN) 10 MG tablet Take 1 tablet (10 mg total) by mouth at bedtime as needed for sleep. 30 tablet 5   No current facility-administered medications for this visit.   Allergies  Allergen Reactions   Lyrica [Pregabalin] Nausea And Vomiting   Codeine Nausea Only     Review of Systems: All systems reviewed and negative except where noted in HPI.   Physical Exam: BP 94/68    Pulse 67    Ht 5\' 5"  (1.651 m)    Wt 193 lb (87.5 kg)    SpO2 99%    BMI 32.12 kg/m  Constitutional: Pleasant,well-developed, female in no acute distress. HEENT: Normocephalic and atraumatic. Conjunctivae are normal. No scleral icterus. Cardiovascular: Normal rate, regular rhythm.  Pulmonary/chest: Effort normal and breath sounds normal. No wheezing, rales or rhonchi. Abdominal: Soft, nondistended, tender to palpation over the entire abdomen. Bowel sounds active throughout. There are no masses palpable. No hepatomegaly. Extremities: No edema Neurological: Alert and oriented to person place and time. Skin: Skin is warm and dry. No rashes noted. Psychiatric: Normal mood and affect. Behavior is normal.  Labs 02/2021: BMP with mildly low K of 3.4. CBC unremarkable.  Labs 11/2020: LFTs unremarkable. TSH nml.  CT A/P w/contrast 01/15/20: IMPRESSION: 1. No acute finding. 2. Hepatic steatosis and probable cholelithiasis. 3. 2.7 cm right ovarian cyst which has a simple CT appearance.  ASSESSMENT AND PLAN:  Colon cancer screening Weight loss Constipation Dysphagia Patient presents for discussion of colon cancer screening.  She has lost weight unintentionally over the last several months.  Has also had some worsening issues with constipation.  We will go ahead and start her on some constipation therapies to see if this will help get her ready for a colonoscopy preparation.  Patient also describes some issues with dysphagia  today but was not interested in endoscopy procedure at this time. - 8 cups of water per day - Start psyllium QD - Start Miralax QD - Colonoscopy [facility] due to patient's use of oxygen PRN. Will check with Dr. 13/8/21 if Eliquis can be held 2 days beforehand - Patient is not interested in EGD at this time  Flora Lipps, MD  I spent 61 minutes of time, including in depth chart review, independent review of results as outlined above, communicating results with the patient directly, face-to-face time with the patient, coordinating care, ordering studies and medications as appropriate, and documentation.

## 2021-04-22 NOTE — Telephone Encounter (Signed)
[facility] Pre-operative Risk Assessment     Request for surgical clearance:     Endoscopy Procedure  What type of surgery is being performed?     Colonoscopy  When is this surgery scheduled?     06/16/21  What type of clearance is required ?   Pharmacy  Are there any medications that need to be held prior to surgery and how long? Eliquis 2 days  Practice name and name of physician performing surgery?      [facility]  What is your office phone and fax number?      Phone- [phone]  [phone]  Anesthesia type (None, local, MAC, general) ?       MAC

## 2021-04-23 NOTE — Telephone Encounter (Signed)
Please see clearance below on patient's Eliquis clearance.

## 2021-04-23 NOTE — Telephone Encounter (Signed)
Spoke with [facility] regarding Dr Clent Ridges advise on pt Eliquis clearance for her colonoscopy /endoscopy, verbalized understanding that they can see Dr Clent Ridges advise on Epic

## 2021-04-23 NOTE — Telephone Encounter (Signed)
° °  Patient Name: Marialuiza Car  DOB: [birth year] MRN: [id number]  Primary Cardiologist: Reatha Harps, MD  Chart reviewed as part of pre-operative protocol coverage. Patient last saw Dr. Flora Lipps 03/17/21 for precordial pain felt to be noncardiac in nature and palpitations. Per his notes, " 2 monitors have been normal over the past 2 years.  I see no need for other monitors.  I suspect her symptoms are anxiety related. -given normal work-up she can see [place] as needed."  Cardiology asked for clearance to hold Eliquis. We do not prescribe or manage this for her. It appears she is on this for hx of PE last refilled by primary care. Recommend GI team reach out to prescribing provider for clearance recommendations.  Will route this bundled recommendation to requesting provider via Epic fax function. Please call with questions.  Laurann Montana, PA-C 04/23/2021, 11:36 AM

## 2021-04-23 NOTE — Telephone Encounter (Signed)
I would like for Pam to stay on Eliquis for the long term, but she is cleared to HOLD this for 5 days prior to her colonoscopy

## 2021-04-24 NOTE — Telephone Encounter (Signed)
Called [phone] and LM for patient to call back to discuss.  Patient has been cleared to hold Eliquis 2 days for procedure with Dr. Leonides Schanz on 4-10.  LM on home phone as well to call back

## 2021-04-25 NOTE — Telephone Encounter (Signed)
Patient returned the call was advised of recommendations to hold Eliquis for two day. Patient understood no further questions.

## 2021-05-06 ENCOUNTER — Encounter: Payer: Self-pay | Admitting: Family Medicine

## 2021-05-06 ENCOUNTER — Telehealth: Payer: Self-pay | Admitting: Family Medicine

## 2021-05-06 ENCOUNTER — Ambulatory Visit (INDEPENDENT_AMBULATORY_CARE_PROVIDER_SITE_OTHER): Payer: Medicare HMO | Admitting: Family Medicine

## 2021-05-06 VITALS — BP 110/70 | HR 74 | Temp 99.2°F | Wt 190.5 lb

## 2021-05-06 DIAGNOSIS — R0602 Shortness of breath: Secondary | ICD-10-CM | POA: Diagnosis not present

## 2021-05-06 DIAGNOSIS — J439 Emphysema, unspecified: Secondary | ICD-10-CM | POA: Diagnosis not present

## 2021-05-06 DIAGNOSIS — I10 Essential (primary) hypertension: Secondary | ICD-10-CM

## 2021-05-06 DIAGNOSIS — R001 Bradycardia, unspecified: Secondary | ICD-10-CM | POA: Diagnosis not present

## 2021-05-06 DIAGNOSIS — F319 Bipolar disorder, unspecified: Secondary | ICD-10-CM | POA: Diagnosis not present

## 2021-05-06 NOTE — Telephone Encounter (Signed)
FYI

## 2021-05-06 NOTE — Telephone Encounter (Signed)
Pt was sent to triage nurse for slow heart beat and SOB and was advise to go to ER . Pt decline to go to er and has an appt with dr fry today at 345 pm

## 2021-05-06 NOTE — Progress Notes (Signed)
° °  Subjective:    Patient ID: Laurie Schaefer, female    DOB: 1955-11-28, 66 y.o.   MRN: [id number]  HPI Here for low heart rates and feeling weak and lightheaded and SOB at times. This started several weeks ago. She attributes most of these symptoms to a low HR. She averages resting HR's in the 60s, but she often drops into the 50's, and occasionally into the 40's her HR was 43 several days ago. When this happens she turns her oxygen up to 3 liters from her usual 2 liters. No chest pain. She saw her cardiologist, Dr. Eleonore Chiquito on 03-27-21 and he seemed to be pleased with her status then. To review, she wore a monitor in December 2021 which showed sinus rhythm with rates from 68 to 160. No episodes of bradycardia. She had an ECHO done in June 2019 which was normal, showing an EF of 65-70%. Dr. Audie Box also noted that she recently had a chest CTA to rule out PE's (it was negative) and no coronary artery calcifications were noted.  Today Laurie Schaefer says she feels to be at her baseline.   Review of Systems  Constitutional:  Positive for fatigue.  Respiratory:  Positive for shortness of breath. Negative for cough, chest tightness and wheezing.   Cardiovascular: Negative.   Neurological:  Positive for light-headedness. Negative for dizziness and headaches.      Objective:   Physical Exam Constitutional:      General: She is not in acute distress.    Comments: Walks with a cane   Cardiovascular:     Rate and Rhythm: Normal rate and regular rhythm.     Pulses: Normal pulses.     Heart sounds: Normal heart sounds.     Comments: EKG today shows normal sinus rhythm  Pulmonary:     Effort: Pulmonary effort is normal.     Breath sounds: No rhonchi or rales.     Comments: Scattered wheezes  Musculoskeletal:     Right lower leg: No edema.     Left lower leg: No edema.  Neurological:     Mental Status: She is alert.          Assessment & Plan:  She has periods of bradycardia that are  symptomatic. Her BP has been stable. We will stop her Amlodipine to see if this is depressing her heart rates. I asked her to follow her HR and BP closely and report back to [place] in 4 weeks. We spent a total of ( 33  ) minutes reviewing records and discussing these issues.  Laurie Penna, MD

## 2021-05-29 ENCOUNTER — Encounter: Payer: Self-pay | Admitting: Family Medicine

## 2021-06-04 ENCOUNTER — Ambulatory Visit (INDEPENDENT_AMBULATORY_CARE_PROVIDER_SITE_OTHER): Payer: Medicare HMO | Admitting: Family Medicine

## 2021-06-04 ENCOUNTER — Other Ambulatory Visit: Payer: Self-pay | Admitting: Family Medicine

## 2021-06-04 ENCOUNTER — Encounter: Payer: Self-pay | Admitting: Family Medicine

## 2021-06-04 VITALS — BP 118/72 | HR 80 | Temp 98.7°F | Wt 191.0 lb

## 2021-06-04 DIAGNOSIS — N390 Urinary tract infection, site not specified: Secondary | ICD-10-CM | POA: Diagnosis not present

## 2021-06-04 DIAGNOSIS — R300 Dysuria: Secondary | ICD-10-CM | POA: Diagnosis not present

## 2021-06-04 LAB — POC URINALSYSI DIPSTICK (AUTOMATED)
Bilirubin, UA: NEGATIVE
Blood, UA: NEGATIVE
Glucose, UA: NEGATIVE
Ketones, UA: NEGATIVE
Nitrite, UA: NEGATIVE
Protein, UA: NEGATIVE
Spec Grav, UA: 1.015 (ref 1.010–1.025)
Urobilinogen, UA: 0.2 E.U./dL
pH, UA: 7 (ref 5.0–8.0)

## 2021-06-04 MED ORDER — PREGABALIN 50 MG PO CAPS: ORAL_CAPSULE | ORAL | 5 refills | Status: DC

## 2021-06-04 MED ORDER — NITROFURANTOIN MONOHYD MACRO 100 MG PO CAPS: 100.0000 mg | ORAL_CAPSULE | Freq: Two times a day (BID) | ORAL | 0 refills | Status: DC

## 2021-06-04 NOTE — Progress Notes (Signed)
? ?  Subjective:  ? ? Patient ID: Keaja Reaume, female    DOB: [birth year], 66 y.o.   MRN: [id number] ? ?HPI ?He for 4 days of urgency and burning on urination. No fever or nausea.  ? ? ?Review of Systems  ?Constitutional: Negative.   ?Respiratory: Negative.    ?Cardiovascular: Negative.   ?Genitourinary:  Positive for dysuria, frequency and urgency. Negative for flank pain and hematuria.  ? ?   ?Objective:  ? Physical Exam ?Constitutional:   ?   Appearance: Normal appearance. She is not ill-appearing.  ?Cardiovascular:  ?   Rate and Rhythm: Normal rate and regular rhythm.  ?   Pulses: Normal pulses.  ?   Heart sounds: Normal heart sounds.  ?Pulmonary:  ?   Effort: Pulmonary effort is normal.  ?   Breath sounds: Normal breath sounds.  ?Abdominal:  ?   Tenderness: There is no right CVA tenderness or left CVA tenderness.  ?Neurological:  ?   Mental Status: She is alert.  ? ? ? ? ? ?   ?Assessment & Plan:  ?UTI, treat with Macrobid. Culture the sample.  ?Gershon Crane, MD ? ? ?

## 2021-06-04 NOTE — Addendum Note (Signed)
Addended by: Rosalyn Gess D on: 06/04/2021 04:45 PM ? ? Modules accepted: Orders ? ?

## 2021-06-06 LAB — URINE CULTURE
MICRO NUMBER:: 13196054
SPECIMEN QUALITY:: ADEQUATE

## 2021-06-06 NOTE — Progress Notes (Signed)
Attempted to obtain medical history via telephone, unable to reach at this time. I left a voicemail to return pre surgical testing department's phone call.  

## 2021-06-11 ENCOUNTER — Other Ambulatory Visit: Payer: Self-pay

## 2021-06-11 ENCOUNTER — Telehealth: Payer: Self-pay | Admitting: Internal Medicine

## 2021-06-11 DIAGNOSIS — N390 Urinary tract infection, site not specified: Secondary | ICD-10-CM

## 2021-06-11 MED ORDER — NITROFURANTOIN MONOHYD MACRO 100 MG PO CAPS: 100.0000 mg | ORAL_CAPSULE | Freq: Two times a day (BID) | ORAL | 0 refills | Status: DC

## 2021-06-11 NOTE — Telephone Encounter (Signed)
Left VM for patient letting her know that we have a prep kit that she can come pick up in the office. ?

## 2021-06-11 NOTE — Telephone Encounter (Signed)
Called patient and listened to her concerns. I let patient know that she was only scheduled for a Colonoscopy and that I would check for a prep kit. ?

## 2021-06-11 NOTE — Telephone Encounter (Signed)
Returned patients call. Patient stated that she was told [facility] on 06/16/21 @ 2pm. I could not find in the chart where this happened. It appears that patient has always been scheduled for [facility] on 06/16/21 @ 9:30am. Patient spoke with Dr.Dorsey and continued to express discontent with Scheduling and instructions. I let patient know that she could pick up prep kit and instructions today or tomorrow because we are closed Friday. Patient stated she would pray on the situation and let us know if she wants to proceed with Colonoscopy. ?

## 2021-06-11 NOTE — Telephone Encounter (Signed)
Patient called to verify procedure date, time, and location. I advised the patient she is expected at [facility] hospital 06/16/21 at 9:30am with Dr. Leonides Schanz for a colonoscopy. Patient states she was told [facility] for an EGD 06/16/21 at 2pm. Patient is requesting a call from a nurse or Doctor to verify this information. Please advise.  ?

## 2021-06-11 NOTE — Telephone Encounter (Signed)
Patient called states she was not given any prep instructions and her procedure is coming up. She is also concerned because she said she saw something in MyChart indication she was scheduled for an EGD and Colon but she only wants to do a colonoscopy. Please call her to discuss further.  ?

## 2021-06-12 NOTE — Telephone Encounter (Signed)
Procedure at [facility] has been cancelled. ?

## 2021-06-12 NOTE — Telephone Encounter (Signed)
Wants to cancel her procedure at the hospital on Monday. Unhappy when I continued to ask her for the reason she wanted to cancel. States" She prefers to not have it" ?

## 2021-06-16 ENCOUNTER — Telehealth: Payer: Self-pay

## 2021-06-16 ENCOUNTER — Encounter (HOSPITAL_COMMUNITY): Admission: RE | Payer: Self-pay | Source: Home / Self Care

## 2021-06-16 ENCOUNTER — Ambulatory Visit (HOSPITAL_COMMUNITY): Admission: RE | Admit: 2021-06-16 | Payer: Medicare HMO | Source: Home / Self Care | Admitting: Internal Medicine

## 2021-06-16 SURGERY — COLONOSCOPY WITH PROPOFOL
Anesthesia: Monitor Anesthesia Care

## 2021-06-16 NOTE — Telephone Encounter (Signed)
Primary Cardiologist:Laurie Cleophus Molt, MD ? ?Chart reviewed as part of pre-operative protocol coverage. Because of Laurie Schaefer's past medical history and time since last visit, he/she will require a virtual visit/telephone call in order to better assess preoperative cardiovascular risk. ? ?Pre-op covering staff: ?- Please contact patient, obtain consent, and schedule appointment  ? ?Patient's Eliquis is prescribed by PCP for history of PE. Clearance to hold Eliquis will need to go to Dr. Clent Ridges.   ? ? ?Levi Aland, NP-C ? ?  ?06/16/2021, 2:09 PM ?[facility] ?[address], [address] ?Office [phone] Fax [phone] ? ?

## 2021-06-16 NOTE — Telephone Encounter (Signed)
? ?  Pre-operative Risk Assessment  ?  ?Patient Name: Laurie Schaefer [facility]  ?DOB: [birth year] ?MRN: [id number]  ? ?  ? ?Request for Surgical Clearance   ? ?Procedure:   RIGHT HIP BURSECTOMY; GLUTEAL TENDON REPAIR ? ?Date of Surgery:  Clearance 07/15/21                              ?   ?Surgeon:  DR Ollen Gross ?Surgeon's Group or Practice Name:  [facility] ?Phone number:  [phone] ?Fax number:  [phone] ?  ?Type of Clearance Requested:   ?- Medical  ?- Pharmacy:  Hold Apixaban (Eliquis) NOT LISTED ?  ?Type of Anesthesia:   CHOICE ?  ?Additional requests/questions:   ?

## 2021-06-16 NOTE — Telephone Encounter (Signed)
1st attempt to reach pt regarding surgical clearance and the need for a telephone visit.  Left a message for pt to call back and ask for the preop team.  ?

## 2021-06-17 NOTE — Telephone Encounter (Signed)
Left message for the pt to call the office to schedule a tele pre op appt 

## 2021-06-18 ENCOUNTER — Encounter: Payer: Self-pay | Admitting: Cardiovascular Disease

## 2021-06-18 ENCOUNTER — Encounter: Payer: Self-pay | Admitting: *Deleted

## 2021-06-18 NOTE — Telephone Encounter (Signed)
Left message x 3 to call for tele pre op appt. I will send letter to pt to call the office. Will send FYI to requesting office the pt needs appt. Will remove from the pre op call back pool at this time. Will re-address once the pt calls back.  ? ?Letter sent thru MY CHART ?

## 2021-06-18 NOTE — Telephone Encounter (Signed)
I called and left a message for the tpt to please accept our apology for our error. We will be sure to correct her chart so that is not reflecting the clearance request that has been placed in her chart. If any questions please call the office [phone].  ?

## 2021-07-16 DIAGNOSIS — Z1211 Encounter for screening for malignant neoplasm of colon: Secondary | ICD-10-CM | POA: Diagnosis not present

## 2021-07-18 ENCOUNTER — Other Ambulatory Visit: Payer: Self-pay

## 2021-07-18 ENCOUNTER — Telehealth: Payer: Self-pay | Admitting: Family Medicine

## 2021-07-18 MED ORDER — LOSARTAN POTASSIUM-HCTZ 100-25 MG PO TABS: 1.0000 | ORAL_TABLET | Freq: Every day | ORAL | 3 refills | Status: DC

## 2021-07-18 NOTE — Telephone Encounter (Signed)
Pt Rx was sent to pharmacy ?

## 2021-07-18 NOTE — Telephone Encounter (Signed)
Pt calling in requesting a refill of losartan to be sent to  ?[id number] [place], [place] Phone:  [phone]  ?Fax:  [phone]  ?  ? ?

## 2021-07-25 ENCOUNTER — Other Ambulatory Visit: Payer: Self-pay | Admitting: Family Medicine

## 2021-07-25 DIAGNOSIS — G43809 Other migraine, not intractable, without status migrainosus: Secondary | ICD-10-CM

## 2021-07-28 ENCOUNTER — Telehealth (INDEPENDENT_AMBULATORY_CARE_PROVIDER_SITE_OTHER): Payer: Medicare HMO | Admitting: Family Medicine

## 2021-07-28 ENCOUNTER — Telehealth: Payer: Self-pay | Admitting: Family Medicine

## 2021-07-28 ENCOUNTER — Encounter: Payer: Self-pay | Admitting: Family Medicine

## 2021-07-28 DIAGNOSIS — F1190 Opioid use, unspecified, uncomplicated: Secondary | ICD-10-CM | POA: Diagnosis not present

## 2021-07-28 DIAGNOSIS — M797 Fibromyalgia: Secondary | ICD-10-CM | POA: Diagnosis not present

## 2021-07-28 MED ORDER — OXYCODONE HCL 30 MG PO TABA: 30.0000 mg | ORAL_TABLET | ORAL | 0 refills | Status: DC | PRN

## 2021-07-28 NOTE — Telephone Encounter (Signed)
Greig Castilla from the pharmacy called because they need clarification on the oxyCODONE HCl 30 MG TABA  Directions are written for IR and patient normally gets IR but this one is extended release.     Please send clarification to [id number] [address] [address], [place] - [address] AT [place] [address] Phone:  [phone]  Fax:  [phone]          Please advise

## 2021-07-28 NOTE — Progress Notes (Signed)
   Subjective:    Patient ID: Laurie Schaefer, female    DOB: 03/24/1955, 66 y.o.   MRN: [id number]  HPI Virtual Visit via Telephone Note  I connected with the patient on 07/28/21 at  3:00 PM EDT by telephone and verified that I am speaking with the correct person using two identifiers.   I discussed the limitations, risks, security and privacy concerns of performing an evaluation and management service by telephone and the availability of in person appointments. I also discussed with the patient that there may be a patient responsible charge related to this service. The patient expressed understanding and agreed to proceed.  Location patient: home Location provider: work or home office Participants present for the call: patient, provider Patient did not have a visit in the prior 7 days to address this/these issue(s).   History of Present Illness: Here for pain management. She is doing well. She is past due for a urine drug test however.    Observations/Objective: Patient sounds cheerful and well on the phone. I do not appreciate any SOB. Speech and thought processing are grossly intact. Patient reported vitals:  Assessment and Plan: Pain management.  Indication for chronic opioid: fibromyalgia Medication and dose: Oxycodone 30 mg  # pills per month: 180 Last UDS date: 05-24-20 Opioid Treatment Agreement signed (Y/N): 01-15-20 Opioid Treatment Agreement last reviewed with patient:  07-28-21 NCCSRS reviewed this encounter (include red flags): Yes We will send in a 30 day supply of Oxycodone. She will come by the  lab for a urine drug screen before any further refills can be sent in. Gershon Crane, MD   Follow Up Instructions:     [phone] 11-20 9443 21-30 I did not refer this patient for an OV in the next 24 hours for this/these issue(s).  I discussed the assessment and treatment plan with the patient. The patient was provided an opportunity to ask questions and  all were answered. The patient agreed with the plan and demonstrated an understanding of the instructions.   The patient was advised to call back or seek an in-person evaluation if the symptoms worsen or if the condition fails to improve as anticipated.  I provided 14 minutes of non-face-to-face time during this encounter.   Gershon Crane, MD     Review of Systems     Objective:   Physical Exam        Assessment & Plan:

## 2021-07-29 ENCOUNTER — Ambulatory Visit: Payer: Medicare HMO | Admitting: Family

## 2021-07-29 ENCOUNTER — Encounter: Payer: Self-pay | Admitting: Family Medicine

## 2021-07-29 ENCOUNTER — Ambulatory Visit (INDEPENDENT_AMBULATORY_CARE_PROVIDER_SITE_OTHER): Payer: Medicare HMO | Admitting: Family Medicine

## 2021-07-29 ENCOUNTER — Telehealth: Payer: Self-pay

## 2021-07-29 VITALS — BP 108/80 | HR 130 | Temp 97.5°F | Wt 188.0 lb

## 2021-07-29 DIAGNOSIS — F1190 Opioid use, unspecified, uncomplicated: Secondary | ICD-10-CM

## 2021-07-29 DIAGNOSIS — N390 Urinary tract infection, site not specified: Secondary | ICD-10-CM

## 2021-07-29 LAB — POC URINALSYSI DIPSTICK (AUTOMATED)
Bilirubin, UA: NEGATIVE
Blood, UA: NEGATIVE
Clarity, UA: NEGATIVE
Glucose, UA: NEGATIVE
Ketones, UA: NEGATIVE
Nitrite, UA: NEGATIVE
Protein, UA: POSITIVE — AB
Spec Grav, UA: 1.025 (ref 1.010–1.025)
Urobilinogen, UA: 0.2 E.U./dL
pH, UA: 6 (ref 5.0–8.0)

## 2021-07-29 MED ORDER — CIPROFLOXACIN HCL 500 MG PO TABS: 500.0000 mg | ORAL_TABLET | Freq: Two times a day (BID) | ORAL | 0 refills | Status: DC

## 2021-07-29 MED ORDER — ONDANSETRON HCL 8 MG PO TABS: 8.0000 mg | ORAL_TABLET | Freq: Four times a day (QID) | ORAL | 1 refills | Status: DC | PRN

## 2021-07-29 MED ORDER — OXYCODONE HCL 30 MG PO TABS: 30.0000 mg | ORAL_TABLET | ORAL | 0 refills | Status: DC | PRN

## 2021-07-29 MED ORDER — CEFTRIAXONE SODIUM 500 MG IJ SOLR
500.0000 mg | Freq: Once | INTRAMUSCULAR | Status: AC
  Administered 2021-07-29: 500 mg via INTRAMUSCULAR

## 2021-07-29 NOTE — Progress Notes (Signed)
   Subjective:    Patient ID: Laurie Schaefer, female    DOB: 09-05-1955, 66 y.o.   MRN: [id number]  HPI Here for one week of weakness, chills, low grade fevers, nausea and vomiting, and decreased appetite. No coughing or ST. She is mildly SOB, but no more than usual. No abdominal pain or diarrhea. Drinking Gatorade. She was treated with Nitrofurantoin for a UTI in March, but the culture showed no predominant species.    Review of Systems  Constitutional:  Positive for chills, fatigue and fever.  HENT: Negative.    Eyes: Negative.   Respiratory: Negative.    Cardiovascular: Negative.   Gastrointestinal:  Positive for nausea and vomiting. Negative for abdominal distention, abdominal pain, blood in stool, constipation and diarrhea.  Genitourinary: Negative.       Objective:   Physical Exam Constitutional:      Comments: She appears mildly ill   Cardiovascular:     Rate and Rhythm: Normal rate and regular rhythm.     Pulses: Normal pulses.     Heart sounds: Normal heart sounds.  Pulmonary:     Effort: Pulmonary effort is normal. No respiratory distress.     Breath sounds: No rhonchi or rales.     Comments: Soft scattered wheezes  Abdominal:     General: Abdomen is flat. Bowel sounds are normal. There is no distension.     Palpations: Abdomen is soft. There is no mass.     Tenderness: There is no abdominal tenderness. There is no right CVA tenderness, left CVA tenderness, guarding or rebound.     Hernia: No hernia is present.  Neurological:     Mental Status: She is alert.          Assessment & Plan:  Recurrent UTI. Treat with 7 days of Cipro. Use Zofran for nausea. Culture the sample. Given a shot of Rocephin.  Gershon Crane, MD

## 2021-07-29 NOTE — Telephone Encounter (Signed)
I just sent in refills for Oxycodone IR. Please call the pharmacy to cancel the ones for TABA

## 2021-07-29 NOTE — Telephone Encounter (Signed)
--  Caller states that she needs a chest x-ray she states she thinks she has pneumonia. SX shortness of breath, extreme exhaustion, low grade fever, and sweating.  Reason: 911 dispo given, caller adamantly refusing. States " no, no no, im not going there it stinks, i would have a better chance of seeing the doctor at my scheduled appointment today at 12:30 in the office" Caller educated that her symptoms would require immediate medical care and encouraged not to wait, encouraged to call 911 for immediate medical attention, reinforced the importance of getting immediate medical attention, caller again states "i heard you" Attempted to warm transfer pt to back line as pt wanted to let office know she would be late to her appointment d/t transportation issues, office back line did not answer.  07/29/2021 10:16:33 AM Call EMS 911 Now Renato Gails, RN, Cristan  Comments User: Caprice Red, RN Date/Time Lamount Cohen Time): 07/29/2021 10:09:17 AM b/p reading 98/52  User: Caprice Red, RN Date/Time Lamount Cohen Time): 07/29/2021 10:10:15 AM reports has appointment at 12:30  User: Caprice Red, RN Date/Time (Eastern Time): 07/29/2021 10:16:03 AM attempted warm transfer was unable to reach anyone on office backline.  Referrals GO TO FACILITY REFUSED

## 2021-07-29 NOTE — Addendum Note (Signed)
Addended by: Carola Rhine on: 07/29/2021 04:08 PM   Modules accepted: Orders

## 2021-07-29 NOTE — Addendum Note (Signed)
Addended by: Carola Rhine on: 07/29/2021 05:12 PM   Modules accepted: Orders

## 2021-07-29 NOTE — Addendum Note (Signed)
Addended by: Carola Rhine on: 07/29/2021 04:55 PM   Modules accepted: Orders

## 2021-07-30 LAB — URINE CULTURE
MICRO NUMBER:: 13433727
SPECIMEN QUALITY:: ADEQUATE

## 2021-07-31 NOTE — Telephone Encounter (Signed)
Spoke with pt pharmacy state that the old Rx has been cancelled

## 2021-08-01 LAB — DRUG MONITOR, PANEL 1, W/CONF, URINE
Alphahydroxyalprazolam: NEGATIVE ng/mL (ref <25)
Alphahydroxymidazolam: NEGATIVE ng/mL (ref <50)
Alphahydroxytriazolam: NEGATIVE ng/mL (ref <50)
Aminoclonazepam: NEGATIVE ng/mL (ref <25)
Amphetamines: NEGATIVE ng/mL (ref <500)
Barbiturates: NEGATIVE ng/mL (ref <300)
Benzodiazepines: POSITIVE ng/mL — AB (ref <100)
Cocaine Metabolite: NEGATIVE ng/mL (ref <150)
Codeine: NEGATIVE ng/mL (ref <50)
Creatinine: 133.3 mg/dL (ref >=20.0)
Hydrocodone: NEGATIVE ng/mL (ref <50)
Hydromorphone: NEGATIVE ng/mL (ref <50)
Hydroxyethylflurazepam: NEGATIVE ng/mL (ref <50)
Lorazepam: 1777 ng/mL — ABNORMAL HIGH (ref <50)
Marijuana Metabolite: NEGATIVE ng/mL (ref <20)
Methadone Metabolite: NEGATIVE ng/mL (ref <100)
Morphine: NEGATIVE ng/mL (ref <50)
Nordiazepam: NEGATIVE ng/mL (ref <50)
Norhydrocodone: NEGATIVE ng/mL (ref <50)
Noroxycodone: >10000 ng/mL — ABNORMAL HIGH (ref <50)
Opiates: NEGATIVE ng/mL (ref <100)
Oxazepam: NEGATIVE ng/mL (ref <50)
Oxidant: NEGATIVE ug/mL (ref <200)
Oxycodone: 5524 ng/mL — ABNORMAL HIGH (ref <50)
Oxycodone: POSITIVE ng/mL — AB (ref <100)
Oxymorphone: 8421 ng/mL — ABNORMAL HIGH (ref <50)
Phencyclidine: NEGATIVE ng/mL (ref <25)
Temazepam: NEGATIVE ng/mL (ref <50)
pH: 6.3 (ref 4.5–9.0)

## 2021-08-01 LAB — DM TEMPLATE

## 2021-08-01 NOTE — Telephone Encounter (Signed)
Pt is calling and per pharm oxycodone was due for refill on 08-24-2021 and June 23-23 not for may 2023 as per pt.  [id number] [address] [place] [address] Phone:  [phone]  Fax:  [phone]

## 2021-08-06 NOTE — Telephone Encounter (Signed)
Rx sent to pt pharmacy by Dr Clent Ridges

## 2021-08-07 ENCOUNTER — Telehealth: Payer: Self-pay

## 2021-08-07 NOTE — Telephone Encounter (Signed)
Pt calling for refill, advised she can pick up the prescription on 09/28/21

## 2021-08-08 MED ORDER — OXYCODONE HCL 30 MG PO TABS: 30.0000 mg | ORAL_TABLET | ORAL | 0 refills | Status: DC | PRN

## 2021-08-08 MED ORDER — OXYCODONE HCL 30 MG PO TABS: 30.0000 mg | ORAL_TABLET | ORAL | 0 refills | Status: AC | PRN

## 2021-08-08 NOTE — Addendum Note (Signed)
Addended by: Gershon Crane A on: 08/08/2021 03:57 PM   Modules accepted: Orders

## 2021-08-08 NOTE — Telephone Encounter (Signed)
Okay I will [facility] the full [facility] [address].please call her old pharmacy to cancel ALL remaining refills.

## 2021-08-08 NOTE — Telephone Encounter (Signed)
Rx at pt old pharmacy have been cancelled. Pt is aware to pick up refill from the [facility]

## 2021-08-08 NOTE — Telephone Encounter (Signed)
Spoke with pt pharmacy this morning, state that they have 3 refills but the June refill is written for 08/22/21, and 08/29/2021, they also have one to fill in 09/28/2021. Pt pharmacy does not currently have Rx in stock, state that is on back order. Attempted to call pt left a message to call back and provide alternative pharmacy to send her June refill. Please advise

## 2021-08-08 NOTE — Telephone Encounter (Signed)
Tell her pharmacy to cancel the refill dated 08-22-21. Keep the one dated 08-29-21. I can send in a partial supply (21 days worth) to cover her from today until 08-29-21 to a different pharmacy, so have the patient find one that his the medication so we can send it in

## 2021-08-08 NOTE — Telephone Encounter (Signed)
[facility] [address] state that they have Oxycodone HCL 30 mg in stock, pt requests to have the 90 days supply sent to [facility] [place] to avoid running out of medication.

## 2021-08-08 NOTE — Telephone Encounter (Signed)
I do not understand. She had a PMV with me on 07-29-21. I sent in 3 RX dated 07-29-21, 08-29-21, and 09-28-21

## 2021-08-26 ENCOUNTER — Encounter: Payer: Self-pay | Admitting: Family Medicine

## 2021-08-26 ENCOUNTER — Ambulatory Visit (INDEPENDENT_AMBULATORY_CARE_PROVIDER_SITE_OTHER): Payer: Medicare HMO | Admitting: Family Medicine

## 2021-08-26 VITALS — BP 126/80 | HR 78 | Temp 98.3°F | Wt 196.0 lb

## 2021-08-26 DIAGNOSIS — N390 Urinary tract infection, site not specified: Secondary | ICD-10-CM

## 2021-08-26 DIAGNOSIS — R300 Dysuria: Secondary | ICD-10-CM | POA: Diagnosis not present

## 2021-08-26 LAB — POC URINALSYSI DIPSTICK (AUTOMATED)
Bilirubin, UA: NEGATIVE
Blood, UA: NEGATIVE
Glucose, UA: NEGATIVE
Ketones, UA: NEGATIVE
Nitrite, UA: NEGATIVE
Protein, UA: NEGATIVE
Spec Grav, UA: 1.015 (ref 1.010–1.025)
Urobilinogen, UA: 0.2 E.U./dL
pH, UA: 6.5 (ref 5.0–8.0)

## 2021-08-26 MED ORDER — NITROFURANTOIN MONOHYD MACRO 100 MG PO CAPS: 100.0000 mg | ORAL_CAPSULE | Freq: Two times a day (BID) | ORAL | 0 refills | Status: DC

## 2021-08-26 NOTE — Progress Notes (Signed)
   Subjective:    Patient ID: Laurie Schaefer, female    DOB: [birth year], 66 y.o.   MRN: [id number]  HPI Here for 3 days of low back pain, burning on urination and urgency. No fever. We saw her on 07-29-21 for similar symptoms. We diagnosed a UTI, and we treated her with 7 days of Cipro. We sent the sample for a culture but no bacteria could be isolated. She says she felt she was totally over it by then time she finished the Cipro. However a few weeks later the symptoms reappeared. She is drinking plenty of fluids.    Review of Systems  Constitutional: Negative.   Respiratory: Negative.    Cardiovascular: Negative.   Gastrointestinal: Negative.   Genitourinary:  Positive for dysuria, frequency and urgency. Negative for flank pain and hematuria.       Objective:   Physical Exam Constitutional:      Appearance: Normal appearance. She is not ill-appearing.  Cardiovascular:     Rate and Rhythm: Normal rate and regular rhythm.     Pulses: Normal pulses.     Heart sounds: Normal heart sounds.  Pulmonary:     Effort: Pulmonary effort is normal.     Breath sounds: Normal breath sounds.  Abdominal:     Tenderness: There is no right CVA tenderness or left CVA tenderness.  Neurological:     Mental Status: She is alert.           Assessment & Plan:  Partially treated UTI. We will attempt to culture the sample again. Treat with 10 days of Macrobid.  Gershon Crane, MD

## 2021-08-26 NOTE — Addendum Note (Signed)
Addended by: Carola Rhine on: 08/26/2021 04:28 PM   Modules accepted: Orders

## 2021-08-27 LAB — URINE CULTURE
MICRO NUMBER:: 13548226
Result:: NO GROWTH
SPECIMEN QUALITY:: ADEQUATE

## 2021-08-29 DIAGNOSIS — Z1211 Encounter for screening for malignant neoplasm of colon: Secondary | ICD-10-CM | POA: Diagnosis not present

## 2021-08-29 HISTORY — PX: COLONOSCOPY: SHX174

## 2021-09-09 DIAGNOSIS — K635 Polyp of colon: Secondary | ICD-10-CM | POA: Diagnosis not present

## 2021-09-16 ENCOUNTER — Telehealth: Payer: Self-pay

## 2021-09-16 DIAGNOSIS — N6320 Unspecified lump in the left breast, unspecified quadrant: Secondary | ICD-10-CM

## 2021-09-16 NOTE — Telephone Encounter (Signed)
No documented CPE in last year. Last mammogram 07/01/20.  LVM instructions for pt to call office to schedule CPE. If pt returns call, please schedule CPE & then transfer to Deja Kaigler to scheduled mammogram.

## 2021-09-19 ENCOUNTER — Other Ambulatory Visit: Payer: Self-pay | Admitting: Family Medicine

## 2021-09-22 NOTE — Telephone Encounter (Signed)
Last mammogram 07/29/20 impression:  The patient was instructed to return for left diagnostic mammography and possible ultrasound in 6 months to follow probably benign findings on recent diagnostic exam that could not be reproduced today for biopsy and are most likely resolved/benign cysts or intraductal debris that has cleared. The patient was informed a reminder notice would be sent regarding this appointment.  Pt notified of above. She would like the order placed for her to be called to breast u/s; she would prefer to make the appt herself when she has her appt book available. Will send to PCP for verbal order.

## 2021-09-22 NOTE — Telephone Encounter (Signed)
Last OV- 08/26/21 Last refill- 03/06/21-60 tabs, 5 refills  Next OV physical- 10/06/2021

## 2021-09-23 NOTE — Addendum Note (Signed)
Addended by: Gershon Crane A on: 09/23/2021 08:00 AM   Modules accepted: Orders

## 2021-09-23 NOTE — Telephone Encounter (Signed)
I put in the orders. This will not be due until the end of October (6 months from 07-01-21)

## 2021-10-06 ENCOUNTER — Ambulatory Visit (INDEPENDENT_AMBULATORY_CARE_PROVIDER_SITE_OTHER): Payer: Medicare HMO | Admitting: Family Medicine

## 2021-10-06 ENCOUNTER — Ambulatory Visit: Payer: Medicare HMO

## 2021-10-06 ENCOUNTER — Ambulatory Visit (INDEPENDENT_AMBULATORY_CARE_PROVIDER_SITE_OTHER): Payer: Medicare HMO

## 2021-10-06 ENCOUNTER — Encounter: Payer: Self-pay | Admitting: Family Medicine

## 2021-10-06 VITALS — BP 120/80 | HR 92 | Temp 98.0°F | Ht 65.0 in | Wt 194.5 lb

## 2021-10-06 DIAGNOSIS — M1712 Unilateral primary osteoarthritis, left knee: Secondary | ICD-10-CM | POA: Diagnosis not present

## 2021-10-06 DIAGNOSIS — M25561 Pain in right knee: Secondary | ICD-10-CM

## 2021-10-06 DIAGNOSIS — G8929 Other chronic pain: Secondary | ICD-10-CM

## 2021-10-06 DIAGNOSIS — Z0000 Encounter for general adult medical examination without abnormal findings: Secondary | ICD-10-CM

## 2021-10-06 DIAGNOSIS — M25562 Pain in left knee: Secondary | ICD-10-CM

## 2021-10-06 DIAGNOSIS — Z0001 Encounter for general adult medical examination with abnormal findings: Secondary | ICD-10-CM | POA: Diagnosis not present

## 2021-10-06 DIAGNOSIS — F32A Depression, unspecified: Secondary | ICD-10-CM

## 2021-10-06 DIAGNOSIS — L578 Other skin changes due to chronic exposure to nonionizing radiation: Secondary | ICD-10-CM

## 2021-10-06 MED ORDER — PREGABALIN 50 MG PO CAPS: ORAL_CAPSULE | ORAL | 5 refills | Status: DC

## 2021-10-06 MED ORDER — ARIPIPRAZOLE 20 MG PO TABS: 20.0000 mg | ORAL_TABLET | Freq: Every day | ORAL | 3 refills | Status: DC

## 2021-10-06 MED ORDER — PANTOPRAZOLE SODIUM 40 MG PO TBEC: 40.0000 mg | DELAYED_RELEASE_TABLET | Freq: Every day | ORAL | 3 refills | Status: DC

## 2021-10-06 MED ORDER — LORAZEPAM 2 MG PO TABS
ORAL_TABLET | ORAL | 5 refills | Status: DC
Start: 2021-10-06 — End: 2022-05-04

## 2021-10-06 MED ORDER — POLYETHYLENE GLYCOL 3350 17 GM/SCOOP PO POWD: 17.0000 g | Freq: Two times a day (BID) | ORAL | 5 refills | Status: DC | PRN

## 2021-10-06 NOTE — Progress Notes (Signed)
Subjective:    Patient ID: Laurie Schaefer, female    DOB: [birth year], 66 y.o.   MRN: [id number]  HPI Here for a well exam. She has a few issues to discuss. First her depression has been acting up a bit lately, and she often gets sad and tearful in the evenings. Also she has had pain in both knees for years, but over the past few months this has gotten worse, especially in the left knee. She uses ice and wears a brace at times. She had a colonoscopy on 08-29-21 and she is still waiting on the results (she had some polyps removed).    Review of Systems  Constitutional: Negative.   HENT: Negative.    Eyes: Negative.   Respiratory: Negative.    Cardiovascular: Negative.   Gastrointestinal: Negative.   Genitourinary:  Negative for decreased urine volume, difficulty urinating, dyspareunia, dysuria, enuresis, flank pain, frequency, hematuria, pelvic pain and urgency.  Musculoskeletal:  Positive for arthralgias.  Skin: Negative.   Neurological: Negative.  Negative for headaches.  Psychiatric/Behavioral:  Positive for dysphoric mood. Negative for agitation, behavioral problems, confusion, hallucinations, self-injury, sleep disturbance and suicidal ideas. The patient is not nervous/anxious.        Objective:   Physical Exam Constitutional:      General: She is not in acute distress.    Appearance: She is well-developed.  HENT:     Head: Normocephalic and atraumatic.     Right Ear: External ear normal.     Left Ear: External ear normal.     Nose: Nose normal.     Mouth/Throat:     Pharynx: No oropharyngeal exudate.  Eyes:     General: No scleral icterus.    Conjunctiva/sclera: Conjunctivae normal.     Pupils: Pupils are equal, round, and reactive to light.  Neck:     Thyroid: No thyromegaly.     Vascular: No JVD.  Cardiovascular:     Rate and Rhythm: Normal rate and regular rhythm.     Heart sounds: Normal heart sounds. No murmur heard.    No friction rub. No gallop.   Pulmonary:     Effort: Pulmonary effort is normal. No respiratory distress.     Breath sounds: Normal breath sounds. No wheezing or rales.  Chest:     Chest wall: No tenderness.  Abdominal:     General: Bowel sounds are normal. There is no distension.     Palpations: Abdomen is soft. There is no mass.     Tenderness: There is no abdominal tenderness. There is no guarding or rebound.  Musculoskeletal:        General: Normal range of motion.     Cervical back: Normal range of motion and neck supple.     Comments: The right knee ins mildly tender in both joint spaces, with full ROM. The left knee is vert tender in both joint spaces and in the posterior knee. ROM is limited by pain.   Lymphadenopathy:     Cervical: No cervical adenopathy.  Skin:    General: Skin is warm and dry.     Comments: She has scattered areas of actinic damage on the trunk and forearms.   Neurological:     Mental Status: She is alert and oriented to person, place, and time.     Cranial Nerves: No cranial nerve deficit.     Motor: No abnormal muscle tone.     Coordination: Coordination normal.     Deep Tendon Reflexes:  Reflexes are normal and symmetric. Reflexes normal.  Psychiatric:        Mood and Affect: Mood normal.        Behavior: Behavior normal.        Thought Content: Thought content normal.        Judgment: Judgment normal.           Assessment & Plan:  Well exam. We discussed diet and exercise. Get fasting labs. For her depression, we will increase the Abilify to 20 mg daily. She likely has OA in the knees, so we will get Xrays of both knees today. Refer to Dermatology for the skin lesions.  Gershon Crane, MD

## 2021-10-07 ENCOUNTER — Encounter: Payer: Self-pay | Admitting: Family Medicine

## 2021-10-10 NOTE — Telephone Encounter (Signed)
She left without getting any of her labs done. The orders are still in place. Have her schedule a fasting lab appt

## 2021-10-15 ENCOUNTER — Other Ambulatory Visit: Payer: Medicare HMO

## 2021-10-15 ENCOUNTER — Other Ambulatory Visit (INDEPENDENT_AMBULATORY_CARE_PROVIDER_SITE_OTHER): Payer: Medicare HMO

## 2021-10-15 DIAGNOSIS — Z0000 Encounter for general adult medical examination without abnormal findings: Secondary | ICD-10-CM | POA: Diagnosis not present

## 2021-10-15 LAB — CBC WITH DIFFERENTIAL/PLATELET
Basophils Absolute: 0.1 10*3/uL (ref 0.0–0.1)
Basophils Relative: 1.3 % (ref 0.0–3.0)
Eosinophils Absolute: 0.3 10*3/uL (ref 0.0–0.7)
Eosinophils Relative: 2.8 % (ref 0.0–5.0)
HCT: 44.2 % (ref 36.0–46.0)
Hemoglobin: 14.7 g/dL (ref 12.0–15.0)
Lymphocytes Relative: 29.8 % (ref 12.0–46.0)
Lymphs Abs: 2.6 10*3/uL (ref 0.7–4.0)
MCHC: 33.2 g/dL (ref 30.0–36.0)
MCV: 86.4 fl (ref 78.0–100.0)
Monocytes Absolute: 0.9 10*3/uL (ref 0.1–1.0)
Monocytes Relative: 10.1 % (ref 3.0–12.0)
Neutro Abs: 5 10*3/uL (ref 1.4–7.7)
Neutrophils Relative %: 56 % (ref 43.0–77.0)
Platelets: 318 10*3/uL (ref 150.0–400.0)
RBC: 5.11 Mil/uL (ref 3.87–5.11)
RDW: 14.3 % (ref 11.5–15.5)
WBC: 8.9 10*3/uL (ref 4.0–10.5)

## 2021-10-15 LAB — LIPID PANEL
Cholesterol: 208 mg/dL — ABNORMAL HIGH (ref 0–200)
HDL: 48.5 mg/dL (ref >39.00)
LDL Cholesterol: 140 mg/dL — ABNORMAL HIGH (ref 0–99)
NonHDL: 159.31
Total CHOL/HDL Ratio: 4
Triglycerides: 99 mg/dL (ref 0.0–149.0)
VLDL: 19.8 mg/dL (ref 0.0–40.0)

## 2021-10-15 LAB — TSH: TSH: 1 u[IU]/mL (ref 0.35–5.50)

## 2021-10-15 LAB — BASIC METABOLIC PANEL
BUN: 14 mg/dL (ref 6–23)
CO2: 29 mEq/L (ref 19–32)
Calcium: 9.7 mg/dL (ref 8.4–10.5)
Chloride: 100 mEq/L (ref 96–112)
Creatinine, Ser: 0.79 mg/dL (ref 0.40–1.20)
GFR: 77.3 mL/min (ref >60.00)
Glucose, Bld: 105 mg/dL — ABNORMAL HIGH (ref 70–99)
Potassium: 3.2 mEq/L — ABNORMAL LOW (ref 3.5–5.1)
Sodium: 139 mEq/L (ref 135–145)

## 2021-10-15 LAB — HEPATIC FUNCTION PANEL
ALT: 15 U/L (ref 0–35)
AST: 23 U/L (ref 0–37)
Albumin: 4.3 g/dL (ref 3.5–5.2)
Alkaline Phosphatase: 81 U/L (ref 39–117)
Bilirubin, Direct: 0.2 mg/dL (ref 0.0–0.3)
Total Bilirubin: 1.1 mg/dL (ref 0.2–1.2)
Total Protein: 7.9 g/dL (ref 6.0–8.3)

## 2021-10-15 LAB — HEMOGLOBIN A1C: Hgb A1c MFr Bld: 5.7 % (ref 4.6–6.5)

## 2021-10-16 DIAGNOSIS — R030 Elevated blood-pressure reading, without diagnosis of hypertension: Secondary | ICD-10-CM | POA: Diagnosis not present

## 2021-10-16 DIAGNOSIS — Z6832 Body mass index (BMI) 32.0-32.9, adult: Secondary | ICD-10-CM | POA: Diagnosis not present

## 2021-10-16 DIAGNOSIS — K648 Other hemorrhoids: Secondary | ICD-10-CM | POA: Diagnosis not present

## 2021-10-16 DIAGNOSIS — Z86010 Personal history of colonic polyps: Secondary | ICD-10-CM | POA: Diagnosis not present

## 2021-10-16 DIAGNOSIS — E669 Obesity, unspecified: Secondary | ICD-10-CM | POA: Diagnosis not present

## 2021-10-17 MED ORDER — POTASSIUM CHLORIDE ER 10 MEQ PO TBCR: 10.0000 meq | EXTENDED_RELEASE_TABLET | Freq: Every day | ORAL | 3 refills | Status: DC

## 2021-10-18 ENCOUNTER — Other Ambulatory Visit: Payer: Self-pay | Admitting: Family Medicine

## 2021-10-18 DIAGNOSIS — G43809 Other migraine, not intractable, without status migrainosus: Secondary | ICD-10-CM

## 2021-11-13 ENCOUNTER — Telehealth: Payer: Self-pay | Admitting: Family Medicine

## 2021-11-13 NOTE — Telephone Encounter (Signed)
Pt is calling and need a refill  sent to new pharm  oxycodone  30 mg  [id number] [address], [place] - [address] Phone:  [phone]  Fax:  [phone]

## 2021-11-14 ENCOUNTER — Other Ambulatory Visit: Payer: Self-pay

## 2021-11-14 NOTE — Telephone Encounter (Signed)
Spoke with patient.   [facility] scheduled 11-18-21 at 10am.

## 2021-11-14 NOTE — Telephone Encounter (Signed)
She needs a PMV for this  

## 2021-11-14 NOTE — Telephone Encounter (Signed)
Last OV CPE- 10/06/2021 Medication not on med list. Pharmacy updated  No future OV scheduled.

## 2021-11-17 ENCOUNTER — Ambulatory Visit (INDEPENDENT_AMBULATORY_CARE_PROVIDER_SITE_OTHER): Payer: Medicare HMO | Admitting: Family Medicine

## 2021-11-17 ENCOUNTER — Encounter: Payer: Self-pay | Admitting: Family Medicine

## 2021-11-17 VITALS — BP 100/70 | HR 85 | Temp 98.2°F | Ht 65.0 in | Wt 203.7 lb

## 2021-11-17 DIAGNOSIS — J209 Acute bronchitis, unspecified: Secondary | ICD-10-CM

## 2021-11-17 DIAGNOSIS — R300 Dysuria: Secondary | ICD-10-CM | POA: Diagnosis not present

## 2021-11-17 LAB — POCT INFLUENZA A/B
Influenza A, POC: NEGATIVE
Influenza B, POC: NEGATIVE

## 2021-11-17 LAB — POC COVID19 BINAXNOW: SARS Coronavirus 2 Ag: NEGATIVE

## 2021-11-17 MED ORDER — METHYLPREDNISOLONE ACETATE 40 MG/ML IJ SUSP
40.0000 mg | Freq: Once | INTRAMUSCULAR | Status: AC
  Administered 2021-11-17: 40 mg via INTRAMUSCULAR

## 2021-11-17 MED ORDER — DOXYCYCLINE HYCLATE 100 MG PO TABS: 100.0000 mg | ORAL_TABLET | Freq: Two times a day (BID) | ORAL | 0 refills | Status: AC

## 2021-11-17 NOTE — Progress Notes (Signed)
Established Patient Office Visit  Subjective   Patient ID: Laurie Schaefer, female    DOB: 1956-02-03  Age: 66 y.o. MRN: [id number]  Chief Complaint  Patient presents with  . Cough    Productive with grey sputum x4 days and chest congestion, tried Mucinex with no relief    Pt reports symptoms started about 4-5 days ago, states that she thinks that she is having a "bad kidney infection" is having increased urinary frequency and headache, reporting subjective fever off and on, she is reporting sore throat, soreness in chest, nausea, states that she usually   Cough This is a new problem. The current episode started in the past 7 days. The problem has been unchanged.    {History (Optional):23778}  Review of Systems  Respiratory:  Positive for cough.   All other systems reviewed and are negative.     Objective:     BP 100/70 (BP Location: Left Arm, Patient Position: Sitting, Cuff Size: Large)   Pulse 85   Temp 98.2 F (36.8 C) (Oral)   Ht 5\' 5"  (1.651 m)   Wt 203 lb 11.2 oz (92.4 kg)   SpO2 98%   BMI 33.90 kg/m  {Vitals History (Optional):23777}  Physical Exam Vitals reviewed.  Constitutional:      Appearance: Normal appearance. She is well-groomed and normal weight.  HENT:     Head: Normocephalic and atraumatic.     Right Ear: Tympanic membrane and ear canal normal.     Left Ear: Tympanic membrane and ear canal normal.  Eyes:     Extraocular Movements: Extraocular movements intact.     Conjunctiva/sclera: Conjunctivae normal.  Cardiovascular:     Rate and Rhythm: Normal rate and regular rhythm.     Pulses: Normal pulses.     Heart sounds: S1 normal and S2 normal.  Pulmonary:     Effort: Pulmonary effort is normal.     Breath sounds: Normal breath sounds and air entry.  Abdominal:     General: Abdomen is flat. Bowel sounds are normal.     Palpations: Abdomen is soft.  Musculoskeletal:        General: Normal range of motion.     Cervical back: Normal  range of motion and neck supple.     Right lower leg: No edema.     Left lower leg: No edema.  Skin:    General: Skin is warm and dry.  Neurological:     Mental Status: She is alert and oriented to person, place, and time. Mental status is at baseline.     Gait: Gait is intact.  Psychiatric:        Mood and Affect: Mood and affect normal.        Speech: Speech normal.        Behavior: Behavior normal.        Judgment: Judgment normal.     No results found for any visits on 11/17/21.  {Labs (Optional):23779}  The 10-year ASCVD risk score (Arnett DK, et al., 2019) is: 6%    Assessment & Plan:   Problem List Items Addressed This Visit       Respiratory   Acute bronchitis   Relevant Medications   methylPREDNISolone acetate (DEPO-MEDROL) injection 40 mg (Start on 11/17/2021  1:30 PM)   doxycycline (VIBRA-TABS) 100 MG tablet   Other Visit Diagnoses     Dysuria    -  Primary   Relevant Orders   POC Urinalysis Dipstick  No follow-ups on file.    Farrel Conners, MD

## 2021-11-18 ENCOUNTER — Encounter: Payer: Self-pay | Admitting: Family Medicine

## 2021-11-18 ENCOUNTER — Telehealth: Payer: Medicare HMO | Admitting: Family Medicine

## 2021-11-19 ENCOUNTER — Encounter: Payer: Self-pay | Admitting: Family Medicine

## 2021-11-19 ENCOUNTER — Telehealth: Payer: Self-pay | Admitting: Acute Care

## 2021-11-19 ENCOUNTER — Telehealth (INDEPENDENT_AMBULATORY_CARE_PROVIDER_SITE_OTHER): Payer: Medicare HMO | Admitting: Family Medicine

## 2021-11-19 DIAGNOSIS — M797 Fibromyalgia: Secondary | ICD-10-CM | POA: Diagnosis not present

## 2021-11-19 DIAGNOSIS — F1190 Opioid use, unspecified, uncomplicated: Secondary | ICD-10-CM | POA: Diagnosis not present

## 2021-11-19 MED ORDER — OXYCODONE HCL 30 MG PO TABS: 30.0000 mg | ORAL_TABLET | ORAL | 0 refills | Status: DC | PRN

## 2021-11-19 MED ORDER — OXYCODONE HCL 30 MG PO TABS: 30.0000 mg | ORAL_TABLET | ORAL | 0 refills | Status: AC | PRN

## 2021-11-19 NOTE — Telephone Encounter (Signed)
Pt called in and left voicemail for Kandice Robinsons, NP but didn't leave details of what was needed. I attempted to return call to pt at [phone] but had to leave a message for pt to call back. Will await call back from patient.

## 2021-11-19 NOTE — Assessment & Plan Note (Signed)
Pt has a history of COPD, this is likely acute bronchitis in the setting of COPD underlying. She did not have any wheezing on exam, I advised she continue her inhalers at home as prescribed and I will add doxycycline 100 mg BID for 7 days. Pt was unable to produce a urine for [place] at this time. I advised that if she has urinary symptoms after the abx she should return to the office for a UA.

## 2021-11-19 NOTE — Progress Notes (Signed)
   Subjective:    Patient ID: Laurie Schaefer, female    DOB: 01-Jan-1956, 66 y.o.   MRN: [id number]  HPI Virtual Visit via Telephone Note  I connected with the patient on 11/19/21 at  3:00 PM EDT by telephone and verified that I am speaking with the correct person using two identifiers.   I discussed the limitations, risks, security and privacy concerns of performing an evaluation and management service by telephone and the availability of in person appointments. I also discussed with the patient that there may be a patient responsible charge related to this service. The patient expressed understanding and agreed to proceed.  Location patient: home Location provider: work or home office Participants present for the call: patient, provider Patient did not have a visit in the prior 7 days to address this/these issue(s).   History of Present Illness: Here for pain management. She is doing fairly well, as her pain waxes and wanes.    Observations/Objective: Patient sounds cheerful and well on the phone. I do not appreciate any SOB. Speech and thought processing are grossly intact. Patient reported vitals:  Assessment and Plan: Pain management. Indication for chronic opioid: fibromyalgia Medication and dose: Oxycodone 30 mg # pills per month: 180 Last UDS date: 07-29-21 Opioid Treatment Agreement signed (Y/N): 01-15-20 Opioid Treatment Agreement last reviewed with patient:  11-19-21 NCCSRS reviewed this encounter (include red flags): Yes Meds were refilled.  Gershon Crane, MD   Follow Up Instructions:     [phone] [phone] 11-20 9443 21-30 I did not refer this patient for an OV in the next 24 hours for this/these issue(s).  I discussed the assessment and treatment plan with the patient. The patient was provided an opportunity to ask questions and all were answered. The patient agreed with the plan and demonstrated an understanding of the instructions.   The patient was  advised to call back or seek an in-person evaluation if the symptoms worsen or if the condition fails to improve as anticipated.  I provided 18 minutes of non-face-to-face time during this encounter.   Gershon Crane, MD     Review of Systems     Objective:   Physical Exam        Assessment & Plan:

## 2021-11-21 ENCOUNTER — Other Ambulatory Visit: Payer: Self-pay | Admitting: Family Medicine

## 2021-11-21 NOTE — Telephone Encounter (Signed)
Spoke with patient by phone.  She is requesting to reschedule a Chest CT- as ordered by Kandice Robinsons, NP.  CT was ordered by PCP office and patient had some 'issue with insurance.'   Advised we would need to schedule an appointment in order to evaluate patient for recommendations.  Patient is currently 'sick with bronchitis' but has been seen by PCP and on antibiotics.  Scheduled OV with SG for Oct 3.  Patient advised to follow up with PCP if urgent needs before this appointment, since she is receiving bronchitis related care at present.  Patient acknowledged understanding and appointment is confirmed.

## 2021-11-24 ENCOUNTER — Telehealth: Payer: Self-pay | Admitting: Family Medicine

## 2021-11-24 NOTE — Telephone Encounter (Signed)
Tried calling patient to schedule Medicare Annual Wellness Visit (AWV) either virtually or in office.   Last AWV ; 11/26/20 please schedule at anytime with [facility] Nurse Health Advisor 1 or 2

## 2021-11-28 ENCOUNTER — Telehealth: Payer: Self-pay | Admitting: Family Medicine

## 2021-11-28 NOTE — Telephone Encounter (Signed)
Call in Macrobid 100 mg BID for 7 days  

## 2021-11-28 NOTE — Telephone Encounter (Signed)
Pharmacy updated.

## 2021-11-28 NOTE — Telephone Encounter (Signed)
Pt was prescribed medication for kidney infection, does not feel it is helping and requesting a refill of nitrofurantoin, macrocrystal-monohydrate, (MACROBID) 100 MG capsule   [id number] [address], [place] - [place] Phone:  [phone]  Fax:  [phone]    Pls call patient to advise

## 2021-12-01 ENCOUNTER — Other Ambulatory Visit: Payer: Self-pay | Admitting: Family Medicine

## 2021-12-01 ENCOUNTER — Other Ambulatory Visit: Payer: Self-pay

## 2021-12-01 DIAGNOSIS — R300 Dysuria: Secondary | ICD-10-CM

## 2021-12-01 MED ORDER — NITROFURANTOIN MONOHYD MACRO 100 MG PO CAPS: 100.0000 mg | ORAL_CAPSULE | Freq: Two times a day (BID) | ORAL | 0 refills | Status: AC

## 2021-12-01 NOTE — Telephone Encounter (Signed)
[facility]mg  [facility] [address]   Call patient to inform

## 2021-12-02 ENCOUNTER — Telehealth: Payer: Self-pay | Admitting: Family Medicine

## 2021-12-02 NOTE — Telephone Encounter (Signed)
Spoke with Leroy Sea to schedule AWV for patient.  He stated they both received their covid booster shot yesterday.    He said she was very sick.  It was hard for her to sit up, she has a bad headache and fever.    I spoke with Leshandra @ [facility] [phone] then transfer Leroy Sea to her.

## 2021-12-03 ENCOUNTER — Telehealth: Payer: Self-pay | Admitting: Family Medicine

## 2021-12-03 NOTE — Telephone Encounter (Signed)
Please check on her today. Set up an OV if needed

## 2021-12-03 NOTE — Telephone Encounter (Signed)
Pt stated they received a COVID & Flu shot on Mon. While being treated for Kidney infection. Pt stated she never been so sick wondering if taking both shots together has made her sick. Would like a call back. Have appt sched for Fri. Offered Thurs but she stated she wanted Fri.  Call [phone]  Please advise.

## 2021-12-04 NOTE — Telephone Encounter (Signed)
Please advise 

## 2021-12-04 NOTE — Telephone Encounter (Signed)
Left detailed message for pt advised to call the office and schedule appointment per Dr Sarajane Jews if any need for one

## 2021-12-05 ENCOUNTER — Ambulatory Visit: Payer: Medicare HMO | Admitting: Family Medicine

## 2021-12-05 NOTE — Telephone Encounter (Signed)
Yes there is no doubt she is sick due to taking the shots together. This will pass in the next few days, she will have to wait it out. In the future I would always wait at least 2 weeks in between vaccines

## 2021-12-08 NOTE — Telephone Encounter (Signed)
Spoke with pt, state that she is feeling much better, state that she called this morning and cancelled her appointment since she did not have a need to see Dr Sarajane Jews

## 2021-12-09 ENCOUNTER — Ambulatory Visit: Payer: Medicare HMO | Admitting: Acute Care

## 2021-12-09 ENCOUNTER — Telehealth: Payer: Self-pay

## 2021-12-09 NOTE — Telephone Encounter (Signed)
Patient left VM on LCS line to cancel her sick visit today with Gladstone Pih, NP.  Patient stated she could not get through to the main office number. Attempt to call back but no answer and Vm cut off before taking a message.  Appt for OV today has been canceled per Vm instruction.

## 2021-12-09 NOTE — Progress Notes (Deleted)
History of Present Illness Laurie Schaefer is a 66 y.o. female with ***   12/09/2021  Test Results:     Latest Ref Rng & Units 10/15/2021   10:00 AM 02/21/2021    1:48 PM 12/04/2020   10:15 AM  CBC  WBC 4.0 - 10.5 K/uL 8.9  8.1  8.8   Hemoglobin 12.0 - 15.0 g/dL 14.7  14.7  13.6   Hematocrit 36.0 - 46.0 % 44.2  43.3  41.0   Platelets 150.0 - 400.0 K/uL 318.0  297  273.0        Latest Ref Rng & Units 10/15/2021   10:00 AM 02/21/2021    1:48 PM 12/04/2020   10:15 AM  BMP  Glucose 70 - 99 mg/dL 105  110  104   BUN 6 - 23 mg/dL 14  9  8    Creatinine 0.40 - 1.20 mg/dL 0.79  0.71  0.71   Sodium 135 - 145 mEq/L 139  139  140   Potassium 3.5 - 5.1 mEq/L 3.2  3.4  3.6   Chloride 96 - 112 mEq/L 100  106  106   CO2 19 - 32 mEq/L 29  23  27    Calcium 8.4 - 10.5 mg/dL 9.7  8.9  9.0     BNP    Component Value Date/Time   BNP 24.6 08/19/2017 1107    ProBNP    Component Value Date/Time   PROBNP 74.0 07/16/2017 1549    PFT    Component Value Date/Time   FEV1PRE 1.12 08/30/2017 0957   FEV1POST 1.02 08/30/2017 0957   FVCPRE 1.78 08/30/2017 0957   FVCPOST 1.84 08/30/2017 0957   TLC 4.10 08/30/2017 0957   DLCOUNC 15.88 08/30/2017 0957   PREFEV1FVCRT 63 08/30/2017 0957   PSTFEV1FVCRT 55 08/30/2017 0957    No results found.   Past medical hx Past Medical History:  Diagnosis Date   Arthritis    Bipolar disorder ([place])    Chronic neck pain    had previously seen Dr. Sandi [facility]cal at [facility]    COPD (chronic obstructive pulmonary disease) [facility])    sees Dr. Lamonte Sakai    Depression    Fibromyalgia    Headache    Hypertension      Social History   Tobacco Use   Smoking status: Never   Smokeless tobacco: Never  Vaping Use   Vaping Use: Never used  Substance Use Topics   Alcohol use: No    Alcohol/week: 0.0 standard drinks of alcohol   Drug use: No    Laurie Schaefer reports that she has never smoked. She has never used smokeless tobacco. She reports that  she does not drink alcohol and does not use drugs.  Tobacco Cessation: Counseling given: Not Answered   Past surgical hx, Family hx, Social hx all reviewed.  Current Outpatient Medications on File Prior to Visit  Medication Sig   albuterol (VENTOLIN HFA) 108 (90 Base) MCG/ACT inhaler INHALE 2 PUFFS INTO THE LUNGS EVERY 6 HOURS AS NEEDED FOR WHEEZING OR SHORTNESS OF BREATH   apixaban (ELIQUIS) 5 MG TABS tablet Take 1 tablet (5 mg total) by mouth 2 (two) times daily.   ARIPiprazole (ABILIFY) 20 MG tablet Take 1 tablet (20 mg total) by mouth daily.   budesonide-formoterol (SYMBICORT) 160-4.5 MCG/ACT inhaler Inhale 2 puffs into the lungs in the morning and at bedtime.   LORazepam (ATIVAN) 2 MG tablet TAKE 1 TABLET(2 MG) BY MOUTH EVERY 6 HOURS AS NEEDED  FOR ANXIETY   losartan-hydrochlorothiazide (HYZAAR) 100-25 MG tablet Take 1 tablet by mouth daily.   methocarbamol (ROBAXIN) 500 MG tablet Take 1 tablet (500 mg total) by mouth every 8 (eight) hours as needed for muscle spasms.   ondansetron (ZOFRAN) 8 MG tablet Take 1 tablet (8 mg total) by mouth every 6 (six) hours as needed for nausea or vomiting.   [START ON 01/19/2022] oxycodone (ROXICODONE) 30 MG immediate release tablet Take 1 tablet (30 mg total) by mouth every 4 (four) hours as needed for pain.   OXYGEN Place 3 L into the nose continuous.   pantoprazole (PROTONIX) 40 MG tablet Take 1 tablet (40 mg total) by mouth daily.   polyethylene glycol powder (GLYCOLAX/MIRALAX) 17 GM/SCOOP powder Take 17 g by mouth 2 (two) times daily as needed.   potassium chloride (KLOR-CON) 10 MEQ tablet Take 1 tablet (10 mEq total) by mouth daily.   prazosin (MINIPRESS) 2 MG capsule Take 1 capsule (2 mg total) by mouth at bedtime. (Patient taking differently: Take 2 mg by mouth at bedtime as needed (sleep).)   pregabalin (LYRICA) 200 MG capsule Take 1 capsule (200 mg total) by mouth at bedtime.   pregabalin (LYRICA) 50 MG capsule Take one capsule in the morning  and one in the afternoon   Psyllium 25 % POWD Take 1-2 teaspoons once daily with first meal of the day.   SUMAtriptan (IMITREX) 100 MG tablet TAKE 1 TABLET BY MOUTH AS NEEDED FOR MIGRAINE. MAY REPEAT IN 2 HOURS IF HEADACHE PERSISTS OR RECURS.   Tiotropium Bromide Monohydrate (SPIRIVA RESPIMAT) 2.5 MCG/ACT AERS Inhale 2 puffs into the lungs daily.   zolpidem (AMBIEN) 10 MG tablet TAKE 1 TABLET BY MOUTH AT BEDTIME AS NEEDED FOR SLEEP.   No current facility-administered medications on file prior to visit.     Allergies  Allergen Reactions   Lyrica [Pregabalin] Nausea And Vomiting   Codeine Nausea Only   Latex Itching    Review Of Systems:  Constitutional:   No  weight loss, night sweats,  Fevers, chills, fatigue, or  lassitude.  HEENT:   No headaches,  Difficulty swallowing,  Tooth/dental problems, or  Sore throat,                No sneezing, itching, ear ache, nasal congestion, post nasal drip,   CV:  No chest pain,  Orthopnea, PND, swelling in lower extremities, anasarca, dizziness, palpitations, syncope.   GI  No heartburn, indigestion, abdominal pain, nausea, vomiting, diarrhea, change in bowel habits, loss of appetite, bloody stools.   Resp: No shortness of breath with exertion or at rest.  No excess mucus, no productive cough,  No non-productive cough,  No coughing up of blood.  No change in color of mucus.  No wheezing.  No chest wall deformity  Skin: no rash or lesions.  GU: no dysuria, change in color of urine, no urgency or frequency.  No flank pain, no hematuria   MS:  No joint pain or swelling.  No decreased range of motion.  No back pain.  Psych:  No change in mood or affect. No depression or anxiety.  No memory loss.   Vital Signs There were no vitals taken for this visit.   Physical Exam:  General- No distress,  A&Ox3 ENT: No sinus tenderness, TM clear, pale nasal mucosa, no oral exudate,no post nasal drip, no LAN Cardiac: S1, S2, regular rate and rhythm, no  murmur Chest: No wheeze/ rales/ dullness; no accessory muscle use,  no nasal flaring, no sternal retractions Abd.: Soft Non-tender Ext: No clubbing cyanosis, edema Neuro:  normal strength Skin: No rashes, warm and dry Psych: normal mood and behavior   Assessment/Plan  No problem-specific Assessment & Plan notes found for this encounter.    Bevelyn Ngo, NP 12/09/2021  10:28 AM

## 2021-12-16 ENCOUNTER — Ambulatory Visit (INDEPENDENT_AMBULATORY_CARE_PROVIDER_SITE_OTHER): Payer: Medicare HMO | Admitting: Family Medicine

## 2021-12-16 ENCOUNTER — Encounter: Payer: Self-pay | Admitting: Family Medicine

## 2021-12-16 VITALS — BP 118/76 | HR 85 | Temp 98.0°F | Wt 198.2 lb

## 2021-12-16 DIAGNOSIS — Z23 Encounter for immunization: Secondary | ICD-10-CM

## 2021-12-16 DIAGNOSIS — N390 Urinary tract infection, site not specified: Secondary | ICD-10-CM | POA: Diagnosis not present

## 2021-12-16 LAB — POC URINALSYSI DIPSTICK (AUTOMATED)
Bilirubin, UA: NEGATIVE
Blood, UA: NEGATIVE
Glucose, UA: NEGATIVE
Ketones, UA: NEGATIVE
Nitrite, UA: NEGATIVE
Protein, UA: POSITIVE — AB
Spec Grav, UA: 1.015 (ref 1.010–1.025)
Urobilinogen, UA: 1 E.U./dL
pH, UA: 6 (ref 5.0–8.0)

## 2021-12-16 MED ORDER — CIPROFLOXACIN HCL 500 MG PO TABS: 500.0000 mg | ORAL_TABLET | Freq: Two times a day (BID) | ORAL | 0 refills | Status: DC

## 2021-12-16 NOTE — Addendum Note (Signed)
Addended by: Wyvonne Lenz on: 12/16/2021 04:20 PM   Modules accepted: Orders

## 2021-12-16 NOTE — Progress Notes (Signed)
   Subjective:    Patient ID: Laurie Schaefer, female    DOB: [birth year], 66 y.o.   MRN: [id number]  HPI Here for 4 days of increased urgency to urinate and the urine has a foul odor. Some back pain but no fever.    Review of Systems  Constitutional: Negative.   Respiratory: Negative.    Cardiovascular: Negative.   Gastrointestinal: Negative.   Genitourinary:  Positive for frequency and urgency. Negative for dysuria and hematuria.       Objective:   Physical Exam Constitutional:      Appearance: Normal appearance. She is not ill-appearing.  Cardiovascular:     Rate and Rhythm: Normal rate and regular rhythm.     Pulses: Normal pulses.     Heart sounds: Normal heart sounds.  Pulmonary:     Effort: Pulmonary effort is normal.     Breath sounds: Normal breath sounds.  Abdominal:     Tenderness: There is no right CVA tenderness or left CVA tenderness.  Neurological:     Mental Status: She is alert.           Assessment & Plan:  UTI, treat with 7 days of Cipro. Culture the sample. Drink plenty of water.  Alysia Penna, MD

## 2021-12-17 LAB — URINE CULTURE
MICRO NUMBER:: 14031539
SPECIMEN QUALITY:: ADEQUATE

## 2021-12-18 ENCOUNTER — Encounter: Payer: Self-pay | Admitting: Family Medicine

## 2021-12-19 MED ORDER — NITROFURANTOIN MONOHYD MACRO 100 MG PO CAPS: 100.0000 mg | ORAL_CAPSULE | Freq: Two times a day (BID) | ORAL | 0 refills | Status: DC

## 2021-12-19 NOTE — Telephone Encounter (Signed)
Noted. I sent in Nitrofurantoin for 7 days

## 2021-12-22 ENCOUNTER — Encounter: Payer: Self-pay | Admitting: Family Medicine

## 2021-12-22 NOTE — Telephone Encounter (Signed)
Stop the Nitrofurantoin and take Benadryl tablets every 4 hours as needed. Call in Keflex 500 mg to take TID for 7days.

## 2021-12-23 NOTE — Telephone Encounter (Signed)
No, it sounds like she wants to finish the Nitrofurantoin

## 2021-12-23 NOTE — Telephone Encounter (Signed)
Noted  

## 2021-12-23 NOTE — Telephone Encounter (Signed)
Message complete.   See patient message dated 12/22/21.

## 2021-12-30 ENCOUNTER — Telehealth: Payer: Self-pay | Admitting: Family Medicine

## 2021-12-30 NOTE — Telephone Encounter (Signed)
Tried calling patient to schedule Medicare Annual Wellness Visit (AWV) either virtually or in office.   No answer    Last AWV 11/26/20 please schedule with Nurse Health Adviser   45 min for awv-i and in office appointments 30 min for awv-s  phone/virtual appointments  

## 2022-01-01 ENCOUNTER — Other Ambulatory Visit: Payer: Self-pay

## 2022-01-01 MED ORDER — CEPHALEXIN 500 MG PO CAPS: 500.0000 mg | ORAL_CAPSULE | Freq: Four times a day (QID) | ORAL | 0 refills | Status: DC

## 2022-01-01 NOTE — Telephone Encounter (Signed)
Rx for Keflex sent to pt pharmacy, left a message regarding Dr Sarajane Jews advise on pt voicemail.

## 2022-01-02 ENCOUNTER — Ambulatory Visit (INDEPENDENT_AMBULATORY_CARE_PROVIDER_SITE_OTHER): Payer: Medicare HMO | Admitting: Acute Care

## 2022-01-02 ENCOUNTER — Encounter: Payer: Self-pay | Admitting: Acute Care

## 2022-01-02 VITALS — BP 120/74 | HR 84 | Temp 97.9°F | Ht 65.0 in | Wt 199.0 lb

## 2022-01-02 DIAGNOSIS — R079 Chest pain, unspecified: Secondary | ICD-10-CM

## 2022-01-02 DIAGNOSIS — R0683 Snoring: Secondary | ICD-10-CM | POA: Diagnosis not present

## 2022-01-02 DIAGNOSIS — I4891 Unspecified atrial fibrillation: Secondary | ICD-10-CM | POA: Diagnosis not present

## 2022-01-02 DIAGNOSIS — J441 Chronic obstructive pulmonary disease with (acute) exacerbation: Secondary | ICD-10-CM | POA: Diagnosis not present

## 2022-01-02 NOTE — Patient Instructions (Addendum)
It is good to see you today We will send a pre-op risk assessment to your dentist. ( Fax number [phone]) You do have additional risk for complications due to your underlying pulmonary disease  Recommendations for minimizing surgical risk 1.Eliquis will need to be held prior to procedure for wash out >> per cardiology>> please getpre-procedure  risk assessment form them also 2. Short duration of surgery as much as possible to avoid hypercapnia  and avoid paralytic if possible 3. Recovery until patient is fully awake and recovered from anesthesia 4. Oxygen with saturation and EKG monitoring during entire procedure, end CO2 monitoring if available 5. Take inhalers prior to procedure  6. Take nebulizer machine and albuterol nebs to procedure in case they are needed.  7. Aggressive pulmonary toilet with o2, bronchodilatation, and incentive spirometry and early ambulation after procedure 8. Resume Eliquis post procedure based on surgical recommendations  9. Follow up in Pulmonary post procedure to ensure you have returned to baseline    Continue oxygen at 2 L [place] at rest and 3 L [place] with exertion. Oxygen saturations should always be > 88% Continue Symbicort and Spiriva as you have been doing. Rinse mouth after use. We will schedule PFT's>> 02/24/2022 They will call to get these scheduled.  Please follow up with your cardiologist for low heart rate and chest pain. We will do a CT Chest to better evaluate your lungs.  You will get a call to get this scheduled.  Note your daily symptoms > remember "red flags" for COPD:  Increase in cough, increase in sputum production, increase in shortness of breath or increase activity intolerance. If you notice these symptoms, please call to be seen.    Follow up after PFT's and CT Chest. >> 02/24/2022 My nurse will place orders for oxygen equipment with [employer]. ( Karen>>  [phone]/ [phone]) Please contact office for sooner follow up if symptoms do  not improve or worsen or seek emergency care

## 2022-01-02 NOTE — Progress Notes (Signed)
History of Present Illness Laurie Schaefer is a 66 y.o. female  former smoker with DOE, Cough variant asthma, chronic respiratory failure with hypoxia  on oxygen at 3 L [place], COPD per PFT numbers, and PE in 01/2020, Eliquis treatment for a fib rvr.. She is followed by Dr. Melvyn Novas Maintenance Symbicort and Spiriva Rescue albuterol    01/02/2022 Pt presents for follow up. She is here today  for multiple  reasons.  She  needs to have root canal and lots of dental work as her teeth are cracking, and breaking  due to her long history of prednisone use .She needs root canal x 2,  bridge x 2  and 2 extractions ). These procedures are scheduled for 03/04/2022.The procedure is scheduled to be done at Dr. Ardelle Lesches and [facility]. They are requesting a pre-op risk assessment  She states she has  chronic chest pain, and a low heart rate. Heart rate today in the office was 84 BPM. She states she is compliant with her Maintenance Symbicort and Spiriva, she has minimal secretions , rare wheezing, but she does experience chest tightness . She is currently not having to use her rescue inhaler often. She is wearing her oxygen at 3 L. At rest and 4 L with exertion.  She does have an oxygen  saturation monitor to monitor her oxygen saturations  at home. She is followed by cardiology and on Eliquis for her atrial fibrillation with RVR.  She has not had PFT's since 2019, and at that time she had Moderately severe Obstructive Airways Disease with Probable restriction and mild diffusion deficit. We will repeat these to get better evaluation of her true pulmonary function  She is switching DME and needs new ordered for all current DMT therapies for  transfer of DME   Pre- Op risk assessment is as noted below.     1) RISK FOR PROLONGED MECHANICAL VENTILAION - > 48h  1A) Arozullah - Prolonged mech ventilation risk Arozullah Postperative Pulmonary Risk Score - for mech ventilation dependence >48h [employer], Ann Surg 2000, major non-cardiac surgery) Comment Score  Type of surgery - abd ao aneurysm (27), thoracic (21), neurosurgery / upper abdominal / vascular (21), neck (11) Dental surgery 0  Emergency Surgery - (11)  0  ALbumin < 3 or poor nutritional state - (9) 4.3 0  BUN > 30 -  (8) 14 0  Partial or completely dependent functional status - (7)  0  COPD -  (6) Moderately severe 6  Age - 60 to 35 (4), > 70  (6)  4  TOTAL  10  Risk Stratifcation scores  - < 10 (0.5%), 11-19 (1.8%), 20-27 (4.2%), 28-40 (10.1%), >40 (26.6%)  0.5% increased risk  Should she require intubation>> 0.5% increased risk of prolonged intubation   2) RISK FOR POST OP PNEUMONIA Score source Risk  Lyndel Safe - Post Op Pnemounia risk  [email or web address] 3.9%  Calculated at 3.9%  R3) ISK FOR ANY POST-OP PULMONARY COMPLICATION Score source Risk  CANET/ARISCAT Score - risk for ANY/ALl pulmonary complications - > risk of in-hospital post-op pulmonary complications (composite including respiratory failure, respiratory infection, pleural effusion, atelectasis, pneumothorax, bronchospasm, aspiration pneumonitis) [email or web address] - based on age, anemia, pulse ox, resp infection prior 30d, incision site, duration of surgery, and emergency v elective surgery 1.6%   1.6% risk of in-hospital post-op pulmonary complications (composite including respiratory failure, respiratory infection, pleural effusion, atelectasis, pneumothorax, bronchospasm, aspiration pneumonitis)  Risk ameliorating factors are  noted  in recommendations below   Major Pulmonary risks identified in the multifactorial risk analysis are but not limited to a) pneumonia; b) recurrent intubation risk; c) prolonged or recurrent acute respiratory failure needing mechanical ventilation; d) prolonged hospitalization; e) DVT/Pulmonary embolism; f) Acute Pulmonary  edema  Recommend 1.Eliquis will need to be held prior to procedure for wash out >> per cardiology 2. Short duration of surgery as much as possible to avoid hypercapnia  and avoid paralytic if possible 3. Recovery until patient is fully awake and recovered from anesthesia 4. Oxygen with saturation and EKG monitoring during entire procedure, end CO2 monitoring if available 5. Take inhalers prior to procedure  6. Take nebulizer machine and albuterol nebs to procedure in case they are needed.  7. Aggressive pulmonary toilet with o2, bronchodilatation, and incentive spirometry and early ambulation after procedure 8. Resume Eliquis post procedure based on surgical recommendations  9. Follow up in Pulmonary post procedure to ensure you have returned to baseline   Test Results:     Latest Ref Rng & Units 10/15/2021   10:00 AM 02/21/2021    1:48 PM 12/04/2020   10:15 AM  CBC  WBC 4.0 - 10.5 K/uL 8.9  8.1  8.8   Hemoglobin 12.0 - 15.0 g/dL 14.7  14.7  13.6   Hematocrit 36.0 - 46.0 % 44.2  43.3  41.0   Platelets 150.0 - 400.0 K/uL 318.0  297  273.0        Latest Ref Rng & Units 10/15/2021   10:00 AM 02/21/2021    1:48 PM 12/04/2020   10:15 AM  BMP  Glucose 70 - 99 mg/dL 105  110  104   BUN 6 - 23 mg/dL 14  9  8    Creatinine 0.40 - 1.20 mg/dL 0.79  0.71  0.71   Sodium 135 - 145 mEq/L 139  139  140   Potassium 3.5 - 5.1 mEq/L 3.2  3.4  3.6   Chloride 96 - 112 mEq/L 100  106  106   CO2 19 - 32 mEq/L 29  23  27    Calcium 8.4 - 10.5 mg/dL 9.7  8.9  9.0     BNP    Component Value Date/Time   BNP 24.6 08/19/2017 1107    ProBNP    Component Value Date/Time   PROBNP 74.0 07/16/2017 1549    PFT    Component Value Date/Time   FEV1PRE 1.12 08/30/2017 0957   FEV1POST 1.02 08/30/2017 0957   FVCPRE 1.78 08/30/2017 0957   FVCPOST 1.84 08/30/2017 0957   TLC 4.10 08/30/2017 0957   DLCOUNC 15.88 08/30/2017 0957   PREFEV1FVCRT 63 08/30/2017 0957   PSTFEV1FVCRT 55 08/30/2017 0957    No  results found.   Past medical hx Past Medical History:  Diagnosis Date   Arthritis    Bipolar disorder ([place])    Chronic neck pain    had previously seen Dr. Sandi Mariscal at [facility]    COPD (chronic obstructive pulmonary disease) [facility])    sees Dr. Lamonte Sakai    Depression    Fibromyalgia    Headache    Hypertension      Social History   Tobacco Use   Smoking status: Never   Smokeless tobacco: Never  Vaping Use   Vaping Use: Never used  Substance Use Topics   Alcohol use: No    Alcohol/week: 0.0 standard drinks of alcohol   Drug use: No    Ms.Kitchens reports that she has  never smoked. She has never used smokeless tobacco. She reports that she does not drink alcohol and does not use drugs.  Tobacco Cessation: Never smoker    Past surgical hx, Family hx, Social hx all reviewed.  Current Outpatient Medications on File Prior to Visit  Medication Sig   albuterol (VENTOLIN HFA) 108 (90 Base) MCG/ACT inhaler INHALE 2 PUFFS INTO THE LUNGS EVERY 6 HOURS AS NEEDED FOR WHEEZING OR SHORTNESS OF BREATH   apixaban (ELIQUIS) 5 MG TABS tablet Take 1 tablet (5 mg total) by mouth 2 (two) times daily.   ARIPiprazole (ABILIFY) 20 MG tablet Take 1 tablet (20 mg total) by mouth daily.   budesonide-formoterol (SYMBICORT) 160-4.5 MCG/ACT inhaler Inhale 2 puffs into the lungs in the morning and at bedtime.   LORazepam (ATIVAN) 2 MG tablet TAKE 1 TABLET(2 MG) BY MOUTH EVERY 6 HOURS AS NEEDED FOR ANXIETY   losartan-hydrochlorothiazide (HYZAAR) 100-25 MG tablet Take 1 tablet by mouth daily.   methocarbamol (ROBAXIN) 500 MG tablet Take 1 tablet (500 mg total) by mouth every 8 (eight) hours as needed for muscle spasms.   ondansetron (ZOFRAN) 8 MG tablet Take 1 tablet (8 mg total) by mouth every 6 (six) hours as needed for nausea or vomiting.   [START ON 01/19/2022] oxycodone (ROXICODONE) 30 MG immediate release tablet Take 1 tablet (30 mg total) by mouth every 4 (four) hours as needed for pain.    OXYGEN Place 3 L into the nose continuous.   pantoprazole (PROTONIX) 40 MG tablet Take 1 tablet (40 mg total) by mouth daily.   polyethylene glycol powder (GLYCOLAX/MIRALAX) 17 GM/SCOOP powder Take 17 g by mouth 2 (two) times daily as needed.   potassium chloride (KLOR-CON) 10 MEQ tablet Take 1 tablet (10 mEq total) by mouth daily.   prazosin (MINIPRESS) 2 MG capsule Take 1 capsule (2 mg total) by mouth at bedtime. (Patient taking differently: Take 2 mg by mouth at bedtime as needed (sleep).)   pregabalin (LYRICA) 200 MG capsule Take 1 capsule (200 mg total) by mouth at bedtime.   pregabalin (LYRICA) 50 MG capsule Take one capsule in the morning and one in the afternoon   Psyllium 25 % POWD Take 1-2 teaspoons once daily with first meal of the day.   SUMAtriptan (IMITREX) 100 MG tablet TAKE 1 TABLET BY MOUTH AS NEEDED FOR MIGRAINE. MAY REPEAT IN 2 HOURS IF HEADACHE PERSISTS OR RECURS.   Tiotropium Bromide Monohydrate (SPIRIVA RESPIMAT) 2.5 MCG/ACT AERS Inhale 2 puffs into the lungs daily.   zolpidem (AMBIEN) 10 MG tablet TAKE 1 TABLET BY MOUTH AT BEDTIME AS NEEDED FOR SLEEP.   No current facility-administered medications on file prior to visit.     Allergies  Allergen Reactions   Lyrica [Pregabalin] Nausea And Vomiting   Ciprofloxacin Other (See Comments)    Per patient makes her "wacko"    Codeine Nausea Only   Latex Itching    Review Of Systems:  Constitutional:   No  weight loss, night sweats,  Fevers, chills, + fatigue, or  lassitude.  HEENT:   No headaches,  Difficulty swallowing,  ++ Tooth/dental problems, or  Sore throat, No sneezing, itching, ear ache, nasal congestion, post nasal drip,   CV:  + chest tightness ,  No Orthopnea, PND, swelling in lower extremities, anasarca, dizziness, palpitations, syncope. Complains of low heart rate  GI  + heartburn, indigestion,  No abdominal pain, nausea, vomiting, diarrhea, change in bowel habits, loss of appetite, bloody stools.  Resp: + baseline  shortness of breath with exertion less  at rest.  No excess mucus, no productive cough,  No non-productive cough,  No coughing up of blood.  No change in color of mucus.  No wheezing.  No chest wall deformity  Skin: no rash or lesions.  GU: no dysuria, change in color of urine, no urgency or frequency.  No flank pain, no hematuria   MS:  + joint pain or swelling.  +decreased range of motion.  No back pain.  Psych:  No change in mood or affect. No depression or anxiety.  No memory loss.   Vital Signs BP 120/74 (BP Location: Left Arm, Cuff Size: Normal)   Pulse 84   Temp 97.9 F (36.6 C)   Ht 5\' 5"  (1.651 m)   Wt 199 lb (90.3 kg)   SpO2 98%   BMI 33.12 kg/m    Physical Exam:  General- No distress,  A&Ox3, pleasant  ENT: No sinus tenderness, TM clear, pale nasal mucosa, no oral exudate,no post nasal drip, no LAN Cardiac: S1, S2, regular rate and rhythm, no murmur, HR 84 Chest: + RUL Exp.  wheeze/ No rales/ dullness; no accessory muscle use, no nasal flaring, no sternal retractions Abd.: Soft Non-tender, ND, BS +, Body mass index is 33.12 kg/m.  Ext: No clubbing cyanosis, edema, no obvious deformities Neuro:  physical deconditioning, MAE x 4, in wheelchair Skin: No rashes, warm and dry, no lesions  Psych: normal mood and behavior   Assessment/Plan DOE,  Cough variant asthma, chronic respiratory failure with hypoxia  on oxygen at 3 L   PE in 01/2020, Plan We will send a pre-op risk assessment to your dentist. ( Fax number [phone]) Continue oxygen at 2 L [place] at rest and 3 L [place] with exertion. Oxygen saturations should always be > 88% Continue Symbicort and Spiriva as you have been doing. Rinse mouth after use. We will schedule PFT's They will call to get these scheduled.  Please follow up with your cardiologist for low heart rate and chest pain. We will do a CT Chest to better evaluate your lungs.  You will get a call to get this scheduled.  Note  your daily symptoms > remember "red flags" for COPD:  Increase in cough, increase in sputum production, increase in shortness of breath or increase activity intolerance. If you notice these symptoms, please call to be seen.    Follow up after PFT's and CT Chest. >> shceduled for 12/19 We will consider home sleep for OSA at follow up. My nurse will place orders for oxygen equipment with [employer]. ( Karen>>  [phone]/ [phone]) Please contact office for sooner follow up if symptoms do not improve or worsen or seek emergency care    Atrial Fibrillation with RVR Plan Follow up with cardiology regarding risk assessment for holding Eliquis and for directions for holding and resuming therapy  Pt. Needs follow up with Dr. Melvyn Novas. She has not seen him for awhile  I spent 50 minutes dedicated to the care of this patient on the date of this encounter to include pre-visit review of records, face-to-face time with the patient discussing conditions above, post visit ordering of testing, clinical documentation with the electronic health record, making appropriate referrals as documented, and communicating necessary information to the patient's healthcare team.   Magdalen Spatz, NP 01/02/2022  10:47 AM

## 2022-01-05 ENCOUNTER — Telehealth: Payer: Self-pay | Admitting: Acute Care

## 2022-01-05 NOTE — Telephone Encounter (Signed)
-----   Message from Magdalen Spatz, NP sent at 01/02/2022 10:42 PM EDT ----- Please fax OV notes with risk assessment to patient's oral surgeon. Fax number is on after visit summary. Please call patient and let her know she should also get a clearance form cardiology as she will need to come off her Eliqius which she takes for her a fib with rvr.  thanks

## 2022-01-05 NOTE — Telephone Encounter (Signed)
Looked at pt's last OV and saw that pt was not walked at that [place]. Looks like pt has not been walked since 2019.  Pt has [employer] and due to this, pt is needing to switch to [employer] for O2.  If [employer] does need pt to be re-walked for evaluation, pt does have an upcoming appt scheduled 12/19 and the walk can be done at that [place].

## 2022-01-05 NOTE — Telephone Encounter (Signed)
Eulas Post, Jasmine  [place], Ronny Flurry, [place]; [place], [place]; [place], Leory Plowman; Marlowe Sax, Erskine Speed got it, looks like we just needed order on file.

## 2022-01-05 NOTE — Telephone Encounter (Signed)
[employer] wants to know if this patient has been walked or tested recently for 02

## 2022-01-05 NOTE — Telephone Encounter (Signed)
OV notes and clearance form have been faxed back to [facility] . Nothing further needed at this time.

## 2022-01-08 NOTE — Progress Notes (Signed)
Cardiology Office Note:   Date:  01/09/2022  NAME:  Laurie Schaefer    MRN: [id number] DOB:  [birth year]   PCP:  Laurey Morale, MD  Cardiologist:  Evalina Field, MD  Electrophysiologist:  None   Referring MD: Laurey Morale, MD   Chief Complaint  Patient presents with   Follow-up   Chest Pain   Headache   Shortness of Breath    History of Present Illness:   Laurie Schaefer is a 66 y.o. female with a hx of COPD, prior myalgia who presents for follow-up of chest pain and shortness of breath.  She presents with several complaints.  She gets short of breath with minimal activity.  She has severe COPD on home oxygen.  She also gets daily chest tightness.  It occurs with activity and alleviated by rest.  It is improved with narcotic medications and pain medicines for her fibromyalgia.  It is difficult to delineate if she actually has myocardial ischemia.  Chest CTs in the past have showed 0 coronary calcium.  Her EKG shows sinus rhythm with no acute ischemic changes or evidence of infarction.  She also reports her heart rate was low at 1 point.  This was detected on a pulse ox.  I did inform her that this is likely inaccurate but she believes this could be real.  We discussed a cardia mobile.  Her blood pressure also was low at times.  She reports it is ranging in the 90s to 100s.  We discussed reducing her blood pressure medication.  She is in agreement.  CV exam normal.  Problem List 1. COPD on 2L -moderate to severe COPD -nonsmoker  2. PE 01/15/2020 -0 coronary calcium -RUL PE 3. Fibromyalgia   Past Medical History: Past Medical History:  Diagnosis Date   Arthritis    Bipolar disorder ([place])    Chronic neck pain    had previously seen Dr. Sandi Mariscal at [facility]    COPD (chronic obstructive pulmonary disease) [facility])    sees Dr. Lamonte Sakai    Depression    Fibromyalgia    Headache    Hypertension     Past Surgical History: Past Surgical History:  Procedure  Laterality Date   COLONOSCOPY  08/29/2021   per Dr. Gerri Spore in [place]      Current Medications: Current Meds  Medication Sig   albuterol (VENTOLIN HFA) 108 (90 Base) MCG/ACT inhaler INHALE 2 PUFFS INTO THE LUNGS EVERY 6 HOURS AS NEEDED FOR WHEEZING OR SHORTNESS OF BREATH   apixaban (ELIQUIS) 5 MG TABS tablet Take 1 tablet (5 mg total) by mouth 2 (two) times daily.   ARIPiprazole (ABILIFY) 20 MG tablet Take 1 tablet (20 mg total) by mouth daily.   budesonide-formoterol (SYMBICORT) 160-4.5 MCG/ACT inhaler Inhale 2 puffs into the lungs in the morning and at bedtime.   LORazepam (ATIVAN) 2 MG tablet TAKE 1 TABLET(2 MG) BY MOUTH EVERY 6 HOURS AS NEEDED FOR ANXIETY   methocarbamol (ROBAXIN) 500 MG tablet Take 1 tablet (500 mg total) by mouth every 8 (eight) hours as needed for muscle spasms.   metoprolol tartrate (LOPRESSOR) 100 MG tablet Take 1 tablet by mouth once for procedure.   ondansetron (ZOFRAN) 8 MG tablet Take 1 tablet (8 mg total) by mouth every 6 (six) hours as needed for nausea or vomiting.   [START ON 01/19/2022] oxycodone (ROXICODONE) 30 MG immediate release tablet Take 1 tablet (30 mg  total) by mouth every 4 (four) hours as needed for pain.   OXYGEN Place 3 L into the nose continuous.   pantoprazole (PROTONIX) 40 MG tablet Take 1 tablet (40 mg total) by mouth daily.   polyethylene glycol powder (GLYCOLAX/MIRALAX) 17 GM/SCOOP powder Take 17 g by mouth 2 (two) times daily as needed.   potassium chloride (KLOR-CON) 10 MEQ tablet Take 1 tablet (10 mEq total) by mouth daily.   prazosin (MINIPRESS) 2 MG capsule Take 1 capsule (2 mg total) by mouth at bedtime. (Patient taking differently: Take 2 mg by mouth at bedtime as needed (sleep).)   pregabalin (LYRICA) 200 MG capsule Take 1 capsule (200 mg total) by mouth at bedtime.   pregabalin (LYRICA) 50 MG capsule Take one capsule in the morning and one in the afternoon   Psyllium 25 % POWD Take 1-2 teaspoons once  daily with first meal of the day.   SUMAtriptan (IMITREX) 100 MG tablet TAKE 1 TABLET BY MOUTH AS NEEDED FOR MIGRAINE. MAY REPEAT IN 2 HOURS IF HEADACHE PERSISTS OR RECURS.   Tiotropium Bromide Monohydrate (SPIRIVA RESPIMAT) 2.5 MCG/ACT AERS Inhale 2 puffs into the lungs daily.   zolpidem (AMBIEN) 10 MG tablet TAKE 1 TABLET BY MOUTH AT BEDTIME AS NEEDED FOR SLEEP.   [DISCONTINUED] losartan-hydrochlorothiazide (HYZAAR) 100-25 MG tablet Take 1 tablet by mouth daily.     Allergies:    Lyrica [pregabalin], Ciprofloxacin, Codeine, and Latex   Social History: Social History   Socioeconomic History   Marital status: Single    Spouse name: Not on file   Number of children: 0   Years of education: Not on file   Highest education level: Not on file  Occupational History   Not on file  Tobacco Use   Smoking status: Never   Smokeless tobacco: Never  Vaping Use   Vaping Use: Never used  Substance and Sexual Activity   Alcohol use: No    Alcohol/week: 0.0 standard drinks of alcohol   Drug use: No   Sexual activity: Not Currently  Other Topics Concern   Not on file  Social History Narrative   Divorced, [place] 1   No children   States she's been to graduate school    Social Determinants of Health   Financial Resource Strain: Low Risk  (11/26/2020)   Overall Financial Resource Strain (CARDIA)    Difficulty of Paying Living Expenses: Not hard at all  Food Insecurity: No Food Insecurity (11/26/2020)   Hunger Vital Sign    Worried About Running Out of Food in the Last Year: Never true    [place] in the Last Year: Never true  Transportation Needs: No Transportation Needs (11/26/2020)   PRAPARE - Hydrologist (Medical): No    Lack of Transportation (Non-Medical): No  Physical Activity: Inactive (11/26/2020)   Exercise Vital Sign    Days of Exercise per Week: 0 days    Minutes of Exercise per Session: 0 min  Stress: No Stress Concern Present (11/26/2020)    [place]    Feeling of Stress : Not at all  Social Connections: Socially Isolated (11/26/2020)   Social Connection and Isolation Panel [NHANES]    Frequency of Communication with Friends and Family: Three times a week    Frequency of Social Gatherings with Friends and Family: Three times a week    Attends Religious Services: Never    Active Member of Clubs  or Organizations: No    Attends Archivist Meetings: Never    Marital Status: Divorced     Family History: The patient's family history includes Depression in her mother; Hypertension in her father and mother; Stroke in her father.  ROS:   All other ROS reviewed and negative. Pertinent positives noted in the HPI.     EKGs/Labs/Other Studies Reviewed:   The following studies were personally reviewed by me today:  EKG:  EKG is ordered today.  The ekg ordered today demonstrates normal sinus rhythm heart rate 87, no acute ischemic changes or evidence of infarction, and was personally reviewed by me.   [place] 11/2020  Impression: 1. No significant arrhythmia detected.  2. Rare ectopy.   Recent Labs: 10/15/2021: ALT 15; BUN 14; Creatinine, Ser 0.79; Hemoglobin 14.7; Platelets 318.0; Potassium 3.2; Sodium 139; TSH 1.00   Recent Lipid Panel    Component Value Date/Time   CHOL 208 (H) 10/15/2021 1000   TRIG 99.0 10/15/2021 1000   HDL 48.50 10/15/2021 1000   CHOLHDL 4 10/15/2021 1000   VLDL 19.8 10/15/2021 1000   LDLCALC 140 (H) 10/15/2021 1000   LDLDIRECT 116.0 07/12/2019 1203    Physical Exam:   VS:  BP 122/78 (BP Location: Right Arm, Patient Position: Sitting, Cuff Size: Large)   Pulse 87   Ht 5\' 5"  (1.651 m)   Wt 201 lb (91.2 kg)   BMI 33.45 kg/m    Wt Readings from Last 3 Encounters:  01/09/22 201 lb (91.2 kg)  01/02/22 199 lb (90.3 kg)  12/16/21 198 lb 4 oz (89.9 kg)    General: Well nourished, well developed, in no acute distress Head:  Atraumatic, normal size  Eyes: PEERLA, EOMI  Neck: Supple, no JVD Endocrine: No thryomegaly Cardiac: Normal S1, S2; RRR; no murmurs, rubs, or gallops Lungs: Clear to auscultation bilaterally, no wheezing, rhonchi or rales  Abd: Soft, nontender, no hepatomegaly  Ext: No edema, pulses 2+ Musculoskeletal: No deformities, BUE and BLE strength normal and equal Skin: Warm and dry, no rashes   Neuro: Alert and oriented to person, place, time, and situation, CNII-XII grossly intact, no focal deficits  Psych: Normal mood and affect   ASSESSMENT:   Wilsie Kern is a 66 y.o. female who presents for the following: 1. Dyspnea on exertion   2. Precordial pain   3. Essential hypertension     PLAN:   1. Dyspnea on exertion 2. Precordial pain -Symptoms are difficult to delineate from COPD.  She does get short of breath and has daily chest discomfort.  Proceed with coronary CTA.  100 mg of metoprolol tartrate 2 hours before the scan.  We need to get a BMP today.  She also needs to obtain an echocardiogram.  She does have a longstanding history of COPD.  Echo in 2019 was unremarkable.  I would like to make sure she is not developing pulmonary hypertension.  She has no symptoms of heart failure today on examination.  3. Essential hypertension -BP too low.  Reduce losartan to 50 mg daily and HCTZ to 12.5 mg daily.  Check her blood pressure daily.  I suspect her pulse was not 25 on her pulse ox.  I believe this is an accurate.  Her monitor from last year showed average heart rate in the 60s.  She will get a cardia mobile to keep an eye on this.      Disposition: Return in about 1 year (around 01/10/2023).  Medication Adjustments/Labs and  Tests Ordered: Current medicines are reviewed at length with the patient today.  Concerns regarding medicines are outlined above.  Orders Placed This Encounter  Procedures   CT CORONARY MORPH W/CTA COR W/SCORE W/CA W/CM &/OR WO/CM   Basic metabolic panel    ECHOCARDIOGRAM COMPLETE   Meds ordered this encounter  Medications   metoprolol tartrate (LOPRESSOR) 100 MG tablet    Sig: Take 1 tablet by mouth once for procedure.    Dispense:  1 tablet    Refill:  0   losartan-hydrochlorothiazide (HYZAAR) 100-25 MG tablet    Sig: Take 0.5 tablets by mouth daily.    Dispense:  45 tablet    Refill:  3    Patient Instructions  Medication Instructions:  Take Metoprolol 100 mg two hours before CT when scheduled.   Cut Losartan/HCTZ 0.5 tablet (50-25 mg)   *If you need a refill on your cardiac medications before your next appointment, please call your pharmacy*   Lab Work: BMET today   If you have labs (blood work) drawn today and your tests are completely normal, you will receive your results only by: [place] (if you have MyChart) OR A paper copy in the mail If you have any lab test that is abnormal or we need to change your treatment, we will call you to review the results.   Testing/Procedures: Your physician has requested that you have cardiac CT. Cardiac computed tomography (CT) is a painless test that uses an x-ray machine to take clear, detailed pictures of your heart. For further information please visit [email or web address].[email or web address]. Please follow instruction sheet as given.  Echocardiogram - Your physician has requested that you have an echocardiogram. Echocardiography is a painless test that uses sound waves to create images of your heart. It provides your doctor with information about the size and shape of your heart and how well your heart's chambers and valves are working. This procedure takes approximately one hour. There are no restrictions for this procedure.     Follow-Up: At [facility], you and your health needs are our priority.  As part of our continuing mission to provide you with exceptional heart care, we have created designated Provider Care Teams.  These Care Teams include your primary Cardiologist (physician)  and Advanced Practice Providers (APPs -  Physician Assistants and Nurse Practitioners) who all work together to provide you with the care you need, when you need it.  We recommend signing up for the patient portal called "MyChart".  Sign up information is provided on this After Visit Summary.  MyChart is used to connect with patients for Virtual Visits (Telemedicine).  Patients are able to view lab/test results, encounter notes, upcoming appointments, etc.  Non-urgent messages can be sent to your provider as well.   To learn more about what you can do with MyChart, go to [email or web address].    Your next appointment:   12 month(s)  The format for your next appointment:   In Person  Provider:   Evalina Field, MD    Look into [facility].      Your cardiac CT will be scheduled at one of the below locations:   [facility] [address] [place], [place] [address] [phone]  If scheduled at [facility], please arrive at the [facility] and Children's Entrance (Entrance C2) of [facility] 30 minutes prior to test start time. You can use the FREE valet parking offered at entrance C (encouraged to control the heart  rate for the test)  Proceed to the [facility] Radiology Department (first floor) to check-in and test prep.  All radiology patients and guests should use entrance C2 at [facility], accessed from [facility], even though the hospital's physical address listed is [address]     Please follow these instructions carefully (unless otherwise directed):  Hold all erectile dysfunction medications at least 3 days (72 hrs) prior to test. (Ie viagra, cialis, sildenafil, tadalafil, etc) We will administer nitroglycerin during this exam.   On the Night Before the Test: Be sure to Drink plenty of water. Do not consume any caffeinated/decaffeinated beverages or chocolate 12 hours prior to your test. Do not take  any antihistamines 12 hours prior to your test.  On the Day of the Test: Drink plenty of water until 1 hour prior to the test. Do not eat any food 1 hour prior to test. You may take your regular medications prior to the test.  Take metoprolol (Lopressor) two hours prior to test. HOLD Furosemide/Hydrochlorothiazide morning of the test. FEMALES- please wear underwire-free bra if available, avoid dresses & tight clothing       After the Test: Drink plenty of water. After receiving IV contrast, you may experience a mild flushed feeling. This is normal. On occasion, you may experience a mild rash up to 24 hours after the test. This is not dangerous. If this occurs, you can take Benadryl 25 mg and increase your fluid intake. If you experience trouble breathing, this can be serious. If it is severe call 911 IMMEDIATELY. If it is mild, please call our office. If you take any of these medications: Glipizide/Metformin, Avandament, Glucavance, please do not take 48 hours after completing test unless otherwise instructed.  We will call to schedule your test 2-4 weeks out understanding that some insurance companies will need an authorization prior to the service being performed.   For non-scheduling related questions, please contact the cardiac imaging nurse navigator should you have any questions/concerns: Marchia Bond, Cardiac Imaging Nurse Navigator Gordy Clement, Cardiac Imaging Nurse Navigator [facility] Direct Office Dial: [phone]   For scheduling needs, including cancellations and rescheduling, please call [place], [phone].      Time Spent with Patient: I have spent a total of 35 minutes with patient reviewing hospital notes, telemetry, EKGs, labs and examining the patient as well as establishing an assessment and plan that was discussed with the patient.  > 50% of time was spent in direct patient care.  Signed, Addison Naegeli. Audie Box, MD, [place]  [address] [place], [place] [address] [phone]  01/09/2022 10:08 AM

## 2022-01-09 ENCOUNTER — Encounter: Payer: Self-pay | Admitting: Cardiovascular Disease

## 2022-01-09 ENCOUNTER — Ambulatory Visit: Payer: Medicare HMO | Attending: Cardiovascular Disease | Admitting: Cardiovascular Disease

## 2022-01-09 VITALS — BP 122/78 | HR 87 | Ht 65.0 in | Wt 201.0 lb

## 2022-01-09 DIAGNOSIS — R072 Precordial pain: Secondary | ICD-10-CM

## 2022-01-09 DIAGNOSIS — I10 Essential (primary) hypertension: Secondary | ICD-10-CM

## 2022-01-09 DIAGNOSIS — R0609 Other forms of dyspnea: Secondary | ICD-10-CM | POA: Diagnosis not present

## 2022-01-09 LAB — BASIC METABOLIC PANEL
BUN/Creatinine Ratio: 17 (ref 12–28)
BUN: 12 mg/dL (ref 8–27)
CO2: 27 mmol/L (ref 20–29)
Calcium: 9.3 mg/dL (ref 8.7–10.3)
Chloride: 101 mmol/L (ref 96–106)
Creatinine, Ser: 0.7 mg/dL (ref 0.57–1.00)
Glucose: 99 mg/dL (ref 70–99)
Potassium: 3.4 mmol/L — ABNORMAL LOW (ref 3.5–5.2)
Sodium: 141 mmol/L (ref 134–144)
eGFR: 95 mL/min/{1.73_m2} (ref >59)

## 2022-01-09 MED ORDER — LOSARTAN POTASSIUM-HCTZ 100-25 MG PO TABS: 0.5000 | ORAL_TABLET | Freq: Every day | ORAL | 3 refills | Status: DC

## 2022-01-09 MED ORDER — METOPROLOL TARTRATE 100 MG PO TABS: ORAL_TABLET | ORAL | 0 refills | Status: DC

## 2022-01-09 NOTE — Patient Instructions (Signed)
Medication Instructions:  Take Metoprolol 100 mg two hours before CT when scheduled.   Cut Losartan/HCTZ 0.5 tablet (50-25 mg)   *If you need a refill on your cardiac medications before your next appointment, please call your pharmacy*   Lab Work: BMET today   If you have labs (blood work) drawn today and your tests are completely normal, you will receive your results only by: MyChart Message (if you have MyChart) OR A paper copy in the mail If you have any lab test that is abnormal or we need to change your treatment, we will call you to review the results.   Testing/Procedures: Your physician has requested that you have cardiac CT. Cardiac computed tomography (CT) is a painless test that uses an x-ray machine to take clear, detailed pictures of your heart. For further information please visit [email or web address] Please follow instruction sheet as given.  Echocardiogram - Your physician has requested that you have an echocardiogram. Echocardiography is a painless test that uses sound waves to create images of your heart. It provides your doctor with information about the size and shape of your heart and how well your heart's chambers and valves are working. This procedure takes approximately one hour. There are no restrictions for this procedure.     Follow-Up: At [facility], you and your health needs are our priority.  As part of our continuing mission to provide you with exceptional heart care, we have created designated Provider Care Teams.  These Care Teams include your primary Cardiologist (physician) and Advanced Practice Providers (APPs -  Physician Assistants and Nurse Practitioners) who all work together to provide you with the care you need, when you need it.  We recommend signing up for the patient portal called "MyChart".  Sign up information is provided on this After Visit Summary.  MyChart is used to connect with patients for Virtual Visits (Telemedicine).   Patients are able to view lab/test results, encounter notes, upcoming appointments, etc.  Non-urgent messages can be sent to your provider as well.   To learn more about what you can do with MyChart, go to [email or web address].    Your next appointment:   12 month(s)  The format for your next appointment:   In Person  Provider:   Reatha Harps, MD    Look into [facility].      Your cardiac CT will be scheduled at one of the below locations:   [facility] [address] [place], [place] [address] [phone]  If scheduled at [facility], please arrive at the [facility] and Children's Entrance (Entrance C2) of [facility] 30 minutes prior to test start time. You can use the FREE valet parking offered at entrance C (encouraged to control the heart rate for the test)  Proceed to the [facility] Radiology Department (first floor) to check-in and test prep.  All radiology patients and guests should use entrance C2 at [facility], accessed from [facility], even though the hospital's physical address listed is [address].     Please follow these instructions carefully (unless otherwise directed):  Hold all erectile dysfunction medications at least 3 days (72 hrs) prior to test. (Ie viagra, cialis, sildenafil, tadalafil, etc) We will administer nitroglycerin during this exam.   On the Night Before the Test: Be sure to Drink plenty of water. Do not consume any caffeinated/decaffeinated beverages or chocolate 12 hours prior to your test. Do not take any antihistamines 12  hours prior to your test.  On the Day of the Test: Drink plenty of water until 1 hour prior to the test. Do not eat any food 1 hour prior to test. You may take your regular medications prior to the test.  Take metoprolol (Lopressor) two hours prior to test. HOLD Furosemide/Hydrochlorothiazide morning of the test. FEMALES- please wear  underwire-free bra if available, avoid dresses & tight clothing       After the Test: Drink plenty of water. After receiving IV contrast, you may experience a mild flushed feeling. This is normal. On occasion, you may experience a mild rash up to 24 hours after the test. This is not dangerous. If this occurs, you can take Benadryl 25 mg and increase your fluid intake. If you experience trouble breathing, this can be serious. If it is severe call 911 IMMEDIATELY. If it is mild, please call our office. If you take any of these medications: Glipizide/Metformin, Avandament, Glucavance, please do not take 48 hours after completing test unless otherwise instructed.  We will call to schedule your test 2-4 weeks out understanding that some insurance companies will need an authorization prior to the service being performed.   For non-scheduling related questions, please contact the cardiac imaging nurse navigator should you have any questions/concerns: Marchia Bond, Cardiac Imaging Nurse Navigator Gordy Clement, Cardiac Imaging Nurse Navigator [facility] Direct Office Dial: [phone]   For scheduling needs, including cancellations and rescheduling, please call [place], [phone].

## 2022-01-09 NOTE — Addendum Note (Signed)
Addended by: Venetia Maxon on: 01/09/2022 11:21 AM   Modules accepted: Orders

## 2022-01-12 ENCOUNTER — Ambulatory Visit (HOSPITAL_COMMUNITY): Payer: Medicare HMO

## 2022-01-13 ENCOUNTER — Ambulatory Visit (HOSPITAL_COMMUNITY)
Admission: RE | Admit: 2022-01-13 | Discharge: 2022-01-13 | Disposition: A | Discharge status: Home / Self Care | Payer: Medicare HMO | Source: Ambulatory Visit | Attending: Acute Care | Admitting: Acute Care

## 2022-01-13 DIAGNOSIS — R079 Chest pain, unspecified: Secondary | ICD-10-CM | POA: Diagnosis not present

## 2022-01-13 DIAGNOSIS — J9811 Atelectasis: Secondary | ICD-10-CM | POA: Diagnosis not present

## 2022-01-19 ENCOUNTER — Telehealth: Payer: Self-pay | Admitting: Acute Care

## 2022-01-20 ENCOUNTER — Encounter: Payer: Self-pay | Admitting: Family Medicine

## 2022-01-20 ENCOUNTER — Ambulatory Visit (INDEPENDENT_AMBULATORY_CARE_PROVIDER_SITE_OTHER): Payer: Medicare HMO | Admitting: Family Medicine

## 2022-01-20 VITALS — BP 118/84 | HR 80 | Temp 98.2°F | Wt 201.0 lb

## 2022-01-20 DIAGNOSIS — K8020 Calculus of gallbladder without cholecystitis without obstruction: Secondary | ICD-10-CM

## 2022-01-20 DIAGNOSIS — I700 Atherosclerosis of aorta: Secondary | ICD-10-CM

## 2022-01-20 DIAGNOSIS — R1013 Epigastric pain: Secondary | ICD-10-CM

## 2022-01-20 DIAGNOSIS — J439 Emphysema, unspecified: Secondary | ICD-10-CM

## 2022-01-20 DIAGNOSIS — R0602 Shortness of breath: Secondary | ICD-10-CM | POA: Diagnosis not present

## 2022-01-20 MED ORDER — ATORVASTATIN CALCIUM 10 MG PO TABS: 10.0000 mg | ORAL_TABLET | Freq: Every day | ORAL | 3 refills | Status: DC

## 2022-01-20 NOTE — Progress Notes (Signed)
   Subjective:    Patient ID: Laurie Schaefer, female    DOB: 11-Jun-1955, 66 y.o.   MRN: [id number]  HPI Here to discuss several issues. First she saw Dr. Cathren Harsh for a cardiology visit on 01-09-22 to discuss her SOB. She has advanced COPD, but there is a question of whether any CAD may be contributing to this. She had a non-contrasted chest CT on 01-13-22 that showed her lungs to be clear, but it also showed gallstones and aortic atherosclerosis. I asked her about any symptoms that could suggest gall bladder disease, and in fact she says about 3 months ago she began to have some upper abdominal pains after eating a meal. These pains are sharp and they are centered in the epigastrium. They last about 30 minutes and then they resolve. She says the worst of these pains came after she had a ribeye steak. There is no nausea or fever. Now that she is using Miralax every day her BM's are regular. The other issue that showed on the CT scan was aortic atherosclerosis. Dr. Flora Lipps has ordered a CT cardiac angiogram to assess her coronary arteries. She is also working with [employer] to obtain a portable oxygen concentrator. She is on continuous oxygen and it is difficult for her to tote around the standard oxygenator she currently uses. We have documented her at rest room air oxygen saturation to be 86%.    Review of Systems  Constitutional: Negative.   Respiratory:  Positive for shortness of breath. Negative for cough and wheezing.   Cardiovascular: Negative.   Gastrointestinal:  Positive for abdominal pain. Negative for abdominal distention, blood in stool, constipation, diarrhea, nausea and vomiting.       Objective:   Physical Exam Constitutional:      General: She is not in acute distress.    Appearance: Normal appearance.  Cardiovascular:     Rate and Rhythm: Normal rate and regular rhythm.     Pulses: Normal pulses.     Heart sounds: Normal heart sounds.  Pulmonary:     Effort:  Pulmonary effort is normal.     Breath sounds: Normal breath sounds.  Abdominal:     General: Abdomen is flat. Bowel sounds are normal. There is no distension.     Palpations: There is no mass.     Tenderness: There is no guarding or rebound.     Hernia: No hernia is present.     Comments: Mild diffuse tenderness   Neurological:     Mental Status: She is alert.           Assessment & Plan:  First she likely has gall bladder disease so we will set up an abdominal US to get a better picture of this. I would anticipate a referral to Surgery after that. She has aortic atherosclerosis so we will start her on Atorvastatin 10 mg daily. She will follow up with Dr. Flora Lipps as above. Third we will complete paperwork for her to get a portable oxygen concentrator.  Gershon Crane, MD

## 2022-01-20 NOTE — Telephone Encounter (Signed)
ATC LVMTCB x 1  

## 2022-01-23 ENCOUNTER — Other Ambulatory Visit: Payer: Self-pay | Admitting: Acute Care

## 2022-01-23 DIAGNOSIS — J439 Emphysema, unspecified: Secondary | ICD-10-CM

## 2022-02-02 ENCOUNTER — Ambulatory Visit (HOSPITAL_COMMUNITY): Payer: Medicare HMO

## 2022-02-02 ENCOUNTER — Encounter: Payer: Self-pay | Admitting: Family Medicine

## 2022-02-02 DIAGNOSIS — K8020 Calculus of gallbladder without cholecystitis without obstruction: Secondary | ICD-10-CM

## 2022-02-02 DIAGNOSIS — R1011 Right upper quadrant pain: Secondary | ICD-10-CM

## 2022-02-03 NOTE — Telephone Encounter (Signed)
We really need to wait for the [place] report before proceeding. I know she is on Oxycodone daily, but Morphine would work better for the abdominal pain. I propose sending her in a temporary supply of Morphine to use when the gall bladder acts up if she agrees

## 2022-02-04 ENCOUNTER — Ambulatory Visit (HOSPITAL_COMMUNITY): Payer: Medicare HMO | Attending: Cardiovascular Disease

## 2022-02-04 DIAGNOSIS — R072 Precordial pain: Secondary | ICD-10-CM | POA: Diagnosis not present

## 2022-02-04 DIAGNOSIS — R0609 Other forms of dyspnea: Secondary | ICD-10-CM | POA: Insufficient documentation

## 2022-02-04 LAB — ECHOCARDIOGRAM COMPLETE
Area-P 1/2: 3.46 cm2
S' Lateral: 2.4 cm

## 2022-02-04 MED ORDER — MORPHINE SULFATE 30 MG PO TABS: 30.0000 mg | ORAL_TABLET | ORAL | 0 refills | Status: DC | PRN

## 2022-02-04 NOTE — Addendum Note (Signed)
Addended by: Gershon Crane A on: 02/04/2022 04:33 PM   Modules accepted: Orders

## 2022-02-04 NOTE — Telephone Encounter (Signed)
I sent in the morphine pills

## 2022-02-05 ENCOUNTER — Ambulatory Visit: Payer: Medicare HMO

## 2022-02-05 ENCOUNTER — Other Ambulatory Visit: Payer: Self-pay

## 2022-02-05 ENCOUNTER — Telehealth: Payer: Self-pay | Admitting: Family Medicine

## 2022-02-05 ENCOUNTER — Other Ambulatory Visit: Payer: Medicare HMO

## 2022-02-05 MED ORDER — METOPROLOL TARTRATE 100 MG PO TABS: ORAL_TABLET | ORAL | 0 refills | Status: DC

## 2022-02-05 NOTE — Telephone Encounter (Addendum)
Pt said she does not have any intentions of taking both medication she said will only take morphine for gallbladder attacks. Pt said the hydrocodone does not work for gallbladder attacks.Pt would like medication change to  [id number] - [place], [place] - [address] AT [facility] OF [facility] [address] Phone: [phone]  Fax: [phone]     [facility] regardless of what md said

## 2022-02-05 NOTE — Telephone Encounter (Signed)
Pharmacy says patient is already on multiple drugs and thinks this is a high dosage of morphine, they need clinic reason why this was prescribed. They close for lunch 130-2.

## 2022-02-05 NOTE — Telephone Encounter (Signed)
[facility] [address] 

## 2022-02-05 NOTE — Telephone Encounter (Signed)
Ps says [facility] pharmacy closes on the weekend and she would like this to be sent to   [id number] [place], [place] - [address] AT [facility] [address] Phone: [phone]  Fax: [phone]

## 2022-02-06 MED ORDER — MORPHINE SULFATE 30 MG PO TABS: 30.0000 mg | ORAL_TABLET | Freq: Four times a day (QID) | ORAL | 0 refills | Status: AC | PRN

## 2022-02-06 NOTE — Telephone Encounter (Signed)
Spoke to pt to schedule hst.  Made triage aware she is on the phone but they were not able to speak to her at the moment.  Cherina stated she would call pt after her current call.  Made pt aware Cherina will be calling her.

## 2022-02-06 NOTE — Telephone Encounter (Signed)
Called patient but she did not answer. Left message for her to call back.  

## 2022-02-06 NOTE — Addendum Note (Signed)
Addended by: Gershon Crane A on: 02/06/2022 09:31 AM   Modules accepted: Orders

## 2022-02-06 NOTE — Telephone Encounter (Signed)
[facility]

## 2022-02-06 NOTE — Telephone Encounter (Signed)
Send pt a message via MyChart 

## 2022-02-09 ENCOUNTER — Other Ambulatory Visit: Payer: Medicare HMO

## 2022-02-09 ENCOUNTER — Ambulatory Visit
Admission: RE | Admit: 2022-02-09 | Discharge: 2022-02-09 | Disposition: A | Discharge status: Home / Self Care | Payer: Medicare HMO | Source: Ambulatory Visit | Attending: Family Medicine | Admitting: Family Medicine

## 2022-02-09 DIAGNOSIS — R1013 Epigastric pain: Secondary | ICD-10-CM

## 2022-02-09 DIAGNOSIS — K760 Fatty (change of) liver, not elsewhere classified: Secondary | ICD-10-CM | POA: Diagnosis not present

## 2022-02-09 DIAGNOSIS — K8020 Calculus of gallbladder without cholecystitis without obstruction: Secondary | ICD-10-CM | POA: Diagnosis not present

## 2022-02-10 ENCOUNTER — Other Ambulatory Visit: Payer: Medicare HMO

## 2022-02-13 NOTE — Telephone Encounter (Signed)
I did the referral to Surgery  ?

## 2022-02-13 NOTE — Addendum Note (Signed)
Addended by: Gershon Crane A on: 02/13/2022 08:01 AM   Modules accepted: Orders

## 2022-02-13 NOTE — Telephone Encounter (Signed)
Noted  

## 2022-02-16 ENCOUNTER — Ambulatory Visit (INDEPENDENT_AMBULATORY_CARE_PROVIDER_SITE_OTHER): Payer: Medicare HMO

## 2022-02-16 ENCOUNTER — Telehealth: Payer: Self-pay | Admitting: Family Medicine

## 2022-02-16 VITALS — Ht 65.0 in | Wt 201.0 lb

## 2022-02-16 DIAGNOSIS — Z0000 Encounter for general adult medical examination without abnormal findings: Secondary | ICD-10-CM

## 2022-02-16 NOTE — Telephone Encounter (Signed)
Pt missed her 2:30pm phone visit and asked if someone could please call her at [phone]

## 2022-02-16 NOTE — Progress Notes (Signed)
Subjective:   Laurie Schaefer is a 66 y.o. female who presents for Medicare Annual (Subsequent) preventive examination.  Review of Systems    Virtual Visit via Telephone Note  I connected with  Laurie Schaefer on 02/16/22 at  2:30 PM EST by telephone and verified that I am speaking with the correct person using two identifiers.  Location: Patient: Home Provider: Office Persons participating in the virtual visit: patient/Nurse Health Advisor   I discussed the limitations, risks, security and privacy concerns of performing an evaluation and management service by telephone and the availability of in person appointments. The patient expressed understanding and agreed to proceed.  Interactive audio and video telecommunications were attempted between this nurse and patient, however failed, due to patient having technical difficulties OR patient did not have access to video capability.  We continued and completed visit with audio only.  Some vital signs may be absent or patient reported.   Tillie RungBeverly W Tjuana Vickrey, LPN  Cardiac Risk Factors include: advanced age (>6855men, 4>65 women);hypertension;Other (see comment), Risk factor comments: Dx COPD     Objective:    Today's Vitals   02/16/22 1611  Weight: 201 lb (91.2 kg)  Height: 5\' 5"  (1.651 m)   Body mass index is 33.45 kg/m.     02/16/2022    4:22 PM 02/21/2021   10:50 AM 11/26/2020    1:58 PM 05/24/2020   12:08 AM 02/22/2019   11:44 AM 03/30/2016   10:53 AM 08/31/2015    2:07 AM  Advanced Directives  Does Patient Have a Medical Advance Directive? Yes No Yes Yes No No No  Type of Estate agentAdvance Directive Healthcare Power of [place];Living will  Healthcare Power of [place];Living will Healthcare Power of Attorney     Does patient want to make changes to medical advance directive?    No - Patient declined     Copy of Healthcare Power of Attorney in Chart? No - copy requested  No - copy requested      Would patient like information on  creating a medical advance directive?  No - Patient declined   No - Patient declined Yes (MAU/Ambulatory/Procedural Areas - Information given) No - patient declined information    Current Medications (verified) Outpatient Encounter Medications as of 02/16/2022  Medication Sig   albuterol (VENTOLIN HFA) 108 (90 Base) MCG/ACT inhaler INHALE 2 PUFFS INTO THE LUNGS EVERY 6 HOURS AS NEEDED FOR WHEEZING OR SHORTNESS OF BREATH   apixaban (ELIQUIS) 5 MG TABS tablet Take 1 tablet (5 mg total) by mouth 2 (two) times daily.   ARIPiprazole (ABILIFY) 20 MG tablet Take 1 tablet (20 mg total) by mouth daily.   atorvastatin (LIPITOR) 10 MG tablet Take 1 tablet (10 mg total) by mouth daily.   budesonide-formoterol (SYMBICORT) 160-4.5 MCG/ACT inhaler Inhale 2 puffs into the lungs in the morning and at bedtime.   LORazepam (ATIVAN) 2 MG tablet TAKE 1 TABLET(2 MG) BY MOUTH EVERY 6 HOURS AS NEEDED FOR ANXIETY   losartan-hydrochlorothiazide (HYZAAR) 100-25 MG tablet Take 0.5 tablets by mouth daily.   methocarbamol (ROBAXIN) 500 MG tablet Take 1 tablet (500 mg total) by mouth every 8 (eight) hours as needed for muscle spasms.   metoprolol tartrate (LOPRESSOR) 100 MG tablet Take 1 tablet by mouth once for procedure.   ondansetron (ZOFRAN) 8 MG tablet Take 1 tablet (8 mg total) by mouth every 6 (six) hours as needed for nausea or vomiting.   oxycodone (ROXICODONE) 30 MG immediate release tablet Take 1  tablet (30 mg total) by mouth every 4 (four) hours as needed for pain.   OXYGEN Place 3 L into the nose continuous.   pantoprazole (PROTONIX) 40 MG tablet Take 1 tablet (40 mg total) by mouth daily.   polyethylene glycol powder (GLYCOLAX/MIRALAX) 17 GM/SCOOP powder Take 17 g by mouth 2 (two) times daily as needed.   potassium chloride (KLOR-CON) 10 MEQ tablet Take 1 tablet (10 mEq total) by mouth daily.   prazosin (MINIPRESS) 2 MG capsule Take 1 capsule (2 mg total) by mouth at bedtime. (Patient taking differently: Take 2  mg by mouth at bedtime as needed (sleep).)   pregabalin (LYRICA) 200 MG capsule Take 1 capsule (200 mg total) by mouth at bedtime.   pregabalin (LYRICA) 50 MG capsule Take one capsule in the morning and one in the afternoon   Psyllium 25 % POWD Take 1-2 teaspoons once daily with first meal of the day.   SUMAtriptan (IMITREX) 100 MG tablet TAKE 1 TABLET BY MOUTH AS NEEDED FOR MIGRAINE. MAY REPEAT IN 2 HOURS IF HEADACHE PERSISTS OR RECURS.   Tiotropium Bromide Monohydrate (SPIRIVA RESPIMAT) 2.5 MCG/ACT AERS Inhale 2 puffs into the lungs daily.   zolpidem (AMBIEN) 10 MG tablet TAKE 1 TABLET BY MOUTH AT BEDTIME AS NEEDED FOR SLEEP.   No facility-administered encounter medications on file as of 02/16/2022.    Allergies (verified) Lyrica [pregabalin], Ciprofloxacin, Codeine, and Latex   History: Past Medical History:  Diagnosis Date   Arthritis    Bipolar disorder (HCC)    Chronic neck pain    had previously [facility] Dr. Salli Real at [facility]    COPD (chronic obstructive pulmonary disease) [facility])    sees Dr. Delton Coombes    Depression    Fibromyalgia    Headache    Hypertension    Past Surgical History:  Procedure Laterality Date   COLONOSCOPY  08/29/2021   per Dr. Carney Harder in [place]   FRACTURE SURGERY     Family History  Problem Relation Age of Onset   Depression Mother    Hypertension Mother    Stroke Father    Hypertension Father    Social History   Socioeconomic History   Marital status: Single    Spouse name: Not on file   Number of children: 0   Years of education: Not on file   Highest education level: Not on file  Occupational History   Not on file  Tobacco Use   Smoking status: Never   Smokeless tobacco: Never  Vaping Use   Vaping Use: Never used  Substance and Sexual Activity   Alcohol use: No    Alcohol/week: 0.0 standard drinks of alcohol   Drug use: No   Sexual activity: Not Currently  Other Topics Concern   Not on file  Social History  Narrative   Divorced, HH 1   No children   States she's been to graduate school    Social Determinants of Health   Financial Resource Strain: Low Risk  (02/16/2022)   Overall Financial Resource Strain (CARDIA)    Difficulty of Paying Living Expenses: Not hard at all  Food Insecurity: No Food Insecurity (02/16/2022)   Hunger Vital Sign    Worried About Running Out of Food in the Last Year: Never true    Ran Out of Food in the Last Year: Never true  Transportation Needs: No Transportation Needs (02/16/2022)   PRAPARE - Administrator, Civil Service (Medical):  No    Lack of Transportation (Non-Medical): No  Physical Activity: Insufficiently Active (02/16/2022)   Exercise Vital Sign    Days of Exercise per Week: 2 days    Minutes of Exercise per Session: 30 min  Stress: No Stress Concern Present (02/16/2022)   [employer] of Occupational Health - Occupational Stress Questionnaire    Feeling of Stress : Not at all  Social Connections: Moderately Integrated (02/16/2022)   Social Connection and Isolation Panel [NHANES]    Frequency of Communication with Friends and Family: More than three times a week    Frequency of Social Gatherings with Friends and Family: More than three times a week    Attends Religious Services: More than 4 times per year    Active Member of [employer] or Organizations: Yes    Attends Engineer, structural: More than 4 times per year    Marital Status: Divorced    Tobacco Counseling Counseling given: Not Answered   Clinical Intake:  Pre-visit preparation completed: No  Pain : No/denies pain     BMI - recorded: 33.45 Nutritional Status: BMI > 30  Obese Nutritional Risks: None Diabetes: No  How often do you need to have someone help you when you read instructions, pamphlets, or other written materials from your doctor or pharmacy?: 1 - Never  Diabetic?  No  Interpreter Needed?: No  Information entered by :: Theresa Mulligan  LPN   Activities of Daily Living    02/16/2022    4:19 PM  In your present state of health, do you have any difficulty performing the following activities:  Hearing? 0  Vision? 0  Difficulty concentrating or making decisions? 0  Walking or climbing stairs? 0  Dressing or bathing? 0  Doing errands, shopping? 0  Preparing Food and eating ? N  Using the Toilet? N  In the past six months, have you accidently leaked urine? Y  Comment Wears pads as needed  Do you have problems with loss of bowel control? N  Managing your Medications? N  Managing your Finances? N  Housekeeping or managing your Housekeeping? N    Patient Care Team: Nelwyn Salisbury, MD as PCP - General (Family Medicine) O'Neal, Ronnald Ramp, MD as PCP - Cardiology (Cardiology)  Indicate any recent Medical Services you may have received from other than [facility] providers in the past year (date may be approximate).     Assessment:   This is a routine wellness examination for Adisa.  Hearing/Vision screen Hearing Screening - Comments:: Denies hearing difficulties   Vision Screening - Comments:: Wears reading glasses - up to date with routine eye exams with Patient deferred   Dietary issues and exercise activities discussed: Exercise limited by: None identified   Goals Addressed               This Visit's Progress     Lose weight (pt-stated)        I would like to lose 30lb.       Depression Screen    02/16/2022    4:18 PM 01/20/2022   11:13 AM 11/19/2021    3:00 PM 10/06/2021   10:26 AM 08/26/2021    4:30 PM 07/29/2021    4:54 PM 06/04/2021    3:00 PM  PHQ 2/9 Scores  PHQ - 2 Score 0 2  1 0 2 2  PHQ- 9 Score 0 8  6 6 10 8   Exception Documentation   Patient refusal  Fall Risk    02/16/2022    4:20 PM 01/20/2022   11:10 AM 11/19/2021    3:00 PM 10/06/2021   10:26 AM 08/26/2021    4:30 PM  Fall Risk   Falls in the past year? 0 0 0 0 0  Number falls in past yr: 0 0 0 0 0  Injury with Fall?  0 0 0 0 0  Risk for fall due to : No Fall Risks No Fall Risks No Fall Risks No Fall Risks   Follow up Falls prevention discussed Falls evaluation completed Falls evaluation completed Falls evaluation completed     FALL RISK PREVENTION PERTAINING TO THE HOME:  Any stairs in or around the home? Yes  If so, are there any without handrails? No  Home free of loose throw rugs in walkways, pet beds, electrical cords, etc? Yes  Adequate lighting in your home to reduce risk of falls? Yes   ASSISTIVE DEVICES UTILIZED TO PREVENT FALLS:  Life alert? No  Use of a cane, walker or w/c? Yes  Grab bars in the bathroom? Yes  Shower chair or bench in shower? Yes  Elevated toilet seat or a handicapped toilet? No   TIMED UP AND GO:  Was the test performed? No . Audio Visit  Cognitive Function:        02/16/2022    4:22 PM  6CIT Screen  What Year? 0 points  What month? 0 points  What time? 0 points  Count back from 20 0 points  Months in reverse 0 points  Repeat phrase 0 points  Total Score 0 points    Immunizations Immunization History  Administered Date(s) Administered   Influenza Inj Mdck Quad Pf 01/29/2016   Influenza Inj Mdck Quad With Preservative 01/29/2016   Influenza Split 12/07/2016   Influenza,inj,Quad PF,6+ Mos 01/29/2016, 12/08/2017   Influenza-Unspecified 11/29/2019   PFIZER(Purple Top)SARS-COV-2 Vaccination 04/23/2019, 05/16/2019, 12/19/2019   Pneumococcal Polysaccharide-23 01/29/2016    TDAP status: Due, Education has been provided regarding the importance of this vaccine. Advised may receive this vaccine at local pharmacy or Health Dept. Aware to provide a copy of the vaccination record if obtained from local pharmacy or Health Dept. Verbalized acceptance and understanding.  Flu Vaccine status: Due, Education has been provided regarding the importance of this vaccine. Advised may receive this vaccine at local pharmacy or Health Dept. Aware to provide a copy of the  vaccination record if obtained from local pharmacy or Health Dept. Verbalized acceptance and understanding.  Pneumococcal vaccine status: Due, Education has been provided regarding the importance of this vaccine. Advised may receive this vaccine at local pharmacy or Health Dept. Aware to provide a copy of the vaccination record if obtained from local pharmacy or Health Dept. Verbalized acceptance and understanding.  Covid-19 vaccine status: Completed vaccines  Qualifies for Shingles Vaccine? Yes   Zostavax completed No   Shingrix Completed?: No.    Education has been provided regarding the importance of this vaccine. Patient has been advised to call insurance company to determine out of pocket expense if they have not yet received this vaccine. Advised may also receive vaccine at local pharmacy or Health Dept. Verbalized acceptance and understanding.  Screening Tests Health Maintenance  Topic Date Due   DTaP/Tdap/Td (1 - Tdap) Never done   MAMMOGRAM  02/16/2022 (Originally 07/01/2021)   COVID-19 Vaccine (4 - 2023-24 season) 03/04/2022 (Originally 11/07/2021)   INFLUENZA VACCINE  06/07/2022 (Originally 10/07/2021)   Pneumonia Vaccine 88+ Years old (2 -  PCV) 11/07/2022 (Originally 01/28/2017)   Zoster Vaccines- Shingrix (1 of 2) 11/07/2022 (Originally 03/07/2004)   Hepatitis C Screening  11/18/2022 (Originally 03/07/1974)   Medicare Annual Wellness (AWV)  02/17/2023   COLONOSCOPY (Pts 45-32yrs Insurance coverage will need to be confirmed)  08/30/2031   DEXA SCAN  Completed   HPV VACCINES  Aged Out    Health Maintenance  Health Maintenance Due  Topic Date Due   DTaP/Tdap/Td (1 - Tdap) Never done    Colorectal cancer screening: Referral to GI placed Patient deferred. Pt aware the office will call re: appt.  Mammogram status: Ordered Patient deferred. Pt provided with contact info and advised to call to schedule appt.   Bone Density status: Completed 04/26/17. Results reflect: Bone density  results: OSTEOPOROSIS. Repeat every   years.  Lung Cancer Screening: (Low Dose CT Chest recommended if Age 84-80 years, 30 pack-year currently smoking OR have quit w/in 15years.) does not qualify.    Additional Screening:  Hepatitis C Screening: does qualify; Completed Deferred  Vision Screening: Recommended annual ophthalmology exams for early detection of glaucoma and other disorders of the eye. Is the patient up to date with their annual eye exam?  Yes  Who is the provider or what is the name of the office in which the patient attends annual eye exams? No If pt is not established with a provider, would they like to be referred to a provider to establish care? No Patient deferred  Dental Screening: Recommended annual dental exams for proper oral hygiene  Community Resource Referral / Chronic Care Management:  CRR required this visit?  No   CCM required this visit?  No      Plan:     I have personally reviewed and noted the following in the patient's chart:   Medical and social history Use of alcohol, tobacco or illicit drugs  Current medications and supplements including opioid prescriptions. Patient is not currently taking opioid prescriptions. Functional ability and status Nutritional status Physical activity Advanced directives List of other physicians Hospitalizations, surgeries, and ER visits in previous 12 months Vitals Screenings to include cognitive, depression, and falls Referrals and appointments  In addition, I have reviewed and discussed with patient certain preventive protocols, quality metrics, and best practice recommendations. A written personalized care plan for preventive services as well as general preventive health recommendations were provided to patient.     Tillie Rung, LPN   16/12/9602   Nurse Notes: None

## 2022-02-16 NOTE — Patient Instructions (Addendum)
Laurie Schaefer , Thank you for taking time to come for your Medicare Wellness Visit. I appreciate your ongoing commitment to your health goals. Please review the following plan we discussed and let me know if I can assist you in the future.   These are the goals we discussed:  Goals       Lose weight (pt-stated)      I would like to lose 30lb.      Patient will find positive outlet for coping        This is a list of the screening recommended for you and due dates:  Health Maintenance  Topic Date Due   DTaP/Tdap/Td vaccine (1 - Tdap) Never done   Mammogram  02/16/2022*   COVID-19 Vaccine (4 - 2023-24 season) 03/04/2022*   Flu Shot  06/07/2022*   Pneumonia Vaccine (2 - PCV) 11/07/2022*   Zoster (Shingles) Vaccine (1 of 2) 11/07/2022*   Hepatitis C Screening: USPSTF Recommendation to screen - Ages 18-79 yo.  11/18/2022*   Medicare Annual Wellness Visit  02/17/2023   Colon Cancer Screening  08/30/2031   DEXA scan (bone density measurement)  Completed   HPV Vaccine  Aged Out  *Topic was postponed. The date shown is not the original due date.    Advanced directives: Please bring a copy of your health care power of attorney and living will to the office to be added to your chart at your convenience.   Conditions/risks identified: None  Next appointment: Follow up in one year for your annual wellness visit     Preventive Care 65 Years and Older, Female Preventive care refers to lifestyle choices and visits with your health care provider that can promote health and wellness. What does preventive care include? A yearly physical exam. This is also called an annual well check. Dental exams once or twice a year. Routine eye exams. Ask your health care provider how often you should have your eyes checked. Personal lifestyle choices, including: Daily care of your teeth and gums. Regular physical activity. Eating a healthy diet. Avoiding tobacco and drug use. Limiting alcohol  use. Practicing safe sex. Taking low-dose aspirin every day. Taking vitamin and mineral supplements as recommended by your health care provider. What happens during an annual well check? The services and screenings done by your health care provider during your annual well check will depend on your age, overall health, lifestyle risk factors, and family history of disease. Counseling  Your health care provider may ask you questions about your: Alcohol use. Tobacco use. Drug use. Emotional well-being. Home and relationship well-being. Sexual activity. Eating habits. History of falls. Memory and ability to understand (cognition). Work and work Astronomer. Reproductive health. Screening  You may have the following tests or measurements: Height, weight, and BMI. Blood pressure. Lipid and cholesterol levels. These may be checked every 5 years, or more frequently if you are over 3 years old. Skin check. Lung cancer screening. You may have this screening every year starting at age 59 if you have a 30-pack-year history of smoking and currently smoke or have quit within the past 15 years. Fecal occult blood test (FOBT) of the stool. You may have this test every year starting at age 42. Flexible sigmoidoscopy or colonoscopy. You may have a sigmoidoscopy every 5 years or a colonoscopy every 10 years starting at age 74. Hepatitis C blood test. Hepatitis B blood test. Sexually transmitted disease (STD) testing. Diabetes screening. This is done by checking your blood sugar (glucose)  after you have not eaten for a while (fasting). You may have this done every 1-3 years. Bone density scan. This is done to screen for osteoporosis. You may have this done starting at age 52. Mammogram. This may be done every 1-2 years. Talk to your health care provider about how often you should have regular mammograms. Talk with your health care provider about your test results, treatment options, and if necessary,  the need for more tests. Vaccines  Your health care provider may recommend certain vaccines, such as: Influenza vaccine. This is recommended every year. Tetanus, diphtheria, and acellular pertussis (Tdap, Td) vaccine. You may need a Td booster every 10 years. Zoster vaccine. You may need this after age 31. Pneumococcal 13-valent conjugate (PCV13) vaccine. One dose is recommended after age 19. Pneumococcal polysaccharide (PPSV23) vaccine. One dose is recommended after age 56. Talk to your health care provider about which screenings and vaccines you need and how often you need them. This information is not intended to replace advice given to you by your health care provider. Make sure you discuss any questions you have with your health care provider. Document Released: 03/22/2015 Document Revised: 11/13/2015 Document Reviewed: 12/25/2014 Elsevier Interactive Patient Education  2017 [employer] Prevention in the Home Falls can cause injuries. They can happen to people of all ages. There are many things you can do to make your home safe and to help prevent falls. What can I do on the outside of my home? Regularly fix the edges of walkways and driveways and fix any cracks. Remove anything that might make you trip as you walk through a door, such as a raised step or threshold. Trim any bushes or trees on the path to your home. Use bright outdoor lighting. Clear any walking paths of anything that might make someone trip, such as rocks or tools. Regularly check to see if handrails are loose or broken. Make sure that both sides of any steps have handrails. Any raised decks and porches should have guardrails on the edges. Have any leaves, snow, or ice cleared regularly. Use sand or salt on walking paths during winter. Clean up any spills in your garage right away. This includes oil or grease spills. What can I do in the bathroom? Use night lights. Install grab bars by the toilet and in the  tub and shower. Do not use towel bars as grab bars. Use non-skid mats or decals in the tub or shower. If you need to sit down in the shower, use a plastic, non-slip stool. Keep the floor dry. Clean up any water that spills on the floor as soon as it happens. Remove soap buildup in the tub or shower regularly. Attach bath mats securely with double-sided non-slip rug tape. Do not have throw rugs and other things on the floor that can make you trip. What can I do in the bedroom? Use night lights. Make sure that you have a light by your bed that is easy to reach. Do not use any sheets or blankets that are too big for your bed. They should not hang down onto the floor. Have a firm chair that has side arms. You can use this for support while you get dressed. Do not have throw rugs and other things on the floor that can make you trip. What can I do in the kitchen? Clean up any spills right away. Avoid walking on wet floors. Keep items that you use a lot in easy-to-reach places. If  you need to reach something above you, use a strong step stool that has a grab bar. Keep electrical cords out of the way. Do not use floor polish or wax that makes floors slippery. If you must use wax, use non-skid floor wax. Do not have throw rugs and other things on the floor that can make you trip. What can I do with my stairs? Do not leave any items on the stairs. Make sure that there are handrails on both sides of the stairs and use them. Fix handrails that are broken or loose. Make sure that handrails are as long as the stairways. Check any carpeting to make sure that it is firmly attached to the stairs. Fix any carpet that is loose or worn. Avoid having throw rugs at the top or bottom of the stairs. If you do have throw rugs, attach them to the floor with carpet tape. Make sure that you have a light switch at the top of the stairs and the bottom of the stairs. If you do not have them, ask someone to add them for  you. What else can I do to help prevent falls? Wear shoes that: Do not have high heels. Have rubber bottoms. Are comfortable and fit you well. Are closed at the toe. Do not wear sandals. If you use a stepladder: Make sure that it is fully opened. Do not climb a closed stepladder. Make sure that both sides of the stepladder are locked into place. Ask someone to hold it for you, if possible. Clearly mark and make sure that you can see: Any grab bars or handrails. First and last steps. Where the edge of each step is. Use tools that help you move around (mobility aids) if they are needed. These include: Canes. Walkers. Scooters. Crutches. Turn on the lights when you go into a dark area. Replace any light bulbs as soon as they burn out. Set up your furniture so you have a clear path. Avoid moving your furniture around. If any of your floors are uneven, fix them. If there are any pets around you, be aware of where they are. Review your medicines with your doctor. Some medicines can make you feel dizzy. This can increase your chance of falling. Ask your doctor what other things that you can do to help prevent falls. This information is not intended to replace advice given to you by your health care provider. Make sure you discuss any questions you have with your health care provider. Document Released: 12/20/2008 Document Revised: 08/01/2015 Document Reviewed: 03/30/2014 Elsevier Interactive Patient Education  2017 [employer].

## 2022-02-19 ENCOUNTER — Other Ambulatory Visit: Payer: Self-pay | Admitting: Family Medicine

## 2022-02-24 ENCOUNTER — Encounter: Payer: Self-pay | Admitting: Family Medicine

## 2022-02-24 ENCOUNTER — Ambulatory Visit: Payer: Medicare HMO | Admitting: Acute Care

## 2022-02-24 DIAGNOSIS — K8020 Calculus of gallbladder without cholecystitis without obstruction: Secondary | ICD-10-CM

## 2022-02-24 NOTE — Telephone Encounter (Signed)
I already did an urgent referral to [facility]

## 2022-02-24 NOTE — Telephone Encounter (Signed)
Patient requesting referral for gall bladder removal. go to [facility]. Provided fax # [phone]

## 2022-02-24 NOTE — Telephone Encounter (Signed)
I did an "urgent " referral to [facility]

## 2022-02-24 NOTE — Telephone Encounter (Signed)
Pt called to ask MD to mark order Stat or Urgent for her gall bladder surgery and please send it right away to:   [facility]  [phone]  So that she does not have 45 days  They accept [facility].  Please advise Pt's call back [phone]

## 2022-02-25 NOTE — Telephone Encounter (Signed)
Spouse called to FU on these requests, stating Pt is in a lot of pain and they need a call back asap!!

## 2022-02-25 NOTE — Telephone Encounter (Signed)
See my answers to her questions yesterday. I cannot do multiple referrals at the same time

## 2022-02-25 NOTE — Telephone Encounter (Signed)
Message sent to pt via My Chart

## 2022-02-25 NOTE — Telephone Encounter (Signed)
Patient requesting  for previous referral to be resent.    Patient sent in duplicate my chart message with exact request.

## 2022-02-25 NOTE — Telephone Encounter (Signed)
My referrals to [facility] and [place] were both urgent. I cannot do a third referral at the same time

## 2022-02-25 NOTE — Telephone Encounter (Signed)
Pt notified via MyChart, no success with pt phone

## 2022-02-25 NOTE — H&P (View-Only) (Signed)
Patient ID: Laurie Schaefer, female   DOB: June 18, 1955, 66 y.o.   MRN: [id number]  Chief Complaint: Epigastric pain  History of Present Illness Laurie Schaefer is a 66 y.o. female with a 57-month history of postprandial epigastric pain which radiates to the back.  Associated nausea and vomiting.  She reports being up all night having pain that was not resolving.  At 4 AM she forced herself to eat a ham sandwich which only exacerbated her pain.  She has not had anything to eat since, but had a small sip of Gatorade while in the office.  She reports her last dose of Eliquis was last night at 10 PM.  She has a history of pulmonary embolism for which she takes the blood thinner.  She denies any history of jaundice, melena, hematochezia, hematemesis.  She denies any biliary emesis, and may have once had a light-colored stool.  She otherwise has had no fevers or chills.  Past Medical History Past Medical History:  Diagnosis Date   Arthritis    Bipolar disorder (HCC)    Chronic neck pain    had previously seen Dr. Salli Real at [facility]    COPD (chronic obstructive pulmonary disease) [facility])    sees Dr. Delton Coombes    Depression    Fibromyalgia    Headache    Hypertension       Past Surgical History:  Procedure Laterality Date   COLONOSCOPY  08/29/2021   per Dr. Carney Harder in [facility] [place]   FRACTURE SURGERY      Allergies  Allergen Reactions   Ciprofloxacin Other (See Comments)    Per patient makes her "wacko"    Codeine Nausea Only   Latex Itching    Current Outpatient Medications  Medication Sig Dispense Refill   albuterol (VENTOLIN HFA) 108 (90 Base) MCG/ACT inhaler INHALE 2 PUFFS INTO THE LUNGS EVERY 6 HOURS AS NEEDED FOR WHEEZING OR SHORTNESS OF BREATH 8.5 g 3   ARIPiprazole (ABILIFY) 20 MG tablet Take 1 tablet (20 mg total) by mouth daily. 90 tablet 3   atorvastatin (LIPITOR) 10 MG tablet Take 1 tablet (10 mg total) by mouth daily. 90 tablet 3    budesonide-formoterol (SYMBICORT) 160-4.5 MCG/ACT inhaler Inhale 2 puffs into the lungs in the morning and at bedtime. 1 each 12   ELIQUIS 5 MG TABS tablet TAKE 1 TABLET BY MOUTH TWICE A DAY 180 tablet 3   LORazepam (ATIVAN) 2 MG tablet TAKE 1 TABLET(2 MG) BY MOUTH EVERY 6 HOURS AS NEEDED FOR ANXIETY 120 tablet 5   losartan-hydrochlorothiazide (HYZAAR) 100-25 MG tablet Take 0.5 tablets by mouth daily. 45 tablet 3   methocarbamol (ROBAXIN) 500 MG tablet Take 1 tablet (500 mg total) by mouth every 8 (eight) hours as needed for muscle spasms. 8 tablet 0   metoprolol tartrate (LOPRESSOR) 100 MG tablet Take 1 tablet by mouth once for procedure. 1 tablet 0   ondansetron (ZOFRAN) 8 MG tablet Take 1 tablet (8 mg total) by mouth every 6 (six) hours as needed for nausea or vomiting. 60 tablet 1   OXYGEN Place 3 L into the nose continuous.     pantoprazole (PROTONIX) 40 MG tablet Take 1 tablet (40 mg total) by mouth daily. 90 tablet 3   polyethylene glycol powder (GLYCOLAX/MIRALAX) 17 GM/SCOOP powder Take 17 g by mouth 2 (two) times daily as needed. 3350 g 5   potassium chloride (KLOR-CON) 10 MEQ tablet Take 1 tablet (10 mEq total) by  mouth daily. 90 tablet 3   prazosin (MINIPRESS) 2 MG capsule Take 1 capsule (2 mg total) by mouth at bedtime. (Patient taking differently: Take 2 mg by mouth at bedtime as needed (sleep).) 90 capsule 3   pregabalin (LYRICA) 200 MG capsule Take 1 capsule (200 mg total) by mouth at bedtime. 30 capsule 5   pregabalin (LYRICA) 50 MG capsule Take one capsule in the morning and one in the afternoon 180 capsule 5   Psyllium 25 % POWD Take 1-2 teaspoons once daily with first meal of the day. 425 g 3   SUMAtriptan (IMITREX) 100 MG tablet TAKE 1 TABLET BY MOUTH AS NEEDED FOR MIGRAINE. MAY REPEAT IN 2 HOURS IF HEADACHE PERSISTS OR RECURS. 9 tablet 2   Tiotropium Bromide Monohydrate (SPIRIVA RESPIMAT) 2.5 MCG/ACT AERS Inhale 2 puffs into the lungs daily. 1 each 12   zolpidem (AMBIEN) 10 MG  tablet TAKE 1 TABLET BY MOUTH AT BEDTIME AS NEEDED FOR SLEEP. 30 tablet 5   No current facility-administered medications for this visit.    Family History Family History  Problem Relation Age of Onset   Depression Mother    Hypertension Mother    Stroke Father    Hypertension Father       Social History Social History   Tobacco Use   Smoking status: Never    Passive exposure: Never   Smokeless tobacco: Never  Vaping Use   Vaping Use: Never used  Substance Use Topics   Alcohol use: No    Alcohol/week: 0.0 standard drinks of alcohol   Drug use: No        Review of Systems  Constitutional:  Positive for diaphoresis and malaise/fatigue. Negative for weight loss.  HENT: Negative.    Eyes: Negative.   Respiratory:  Positive for cough, shortness of breath and wheezing.   Cardiovascular:  Positive for chest pain.  Gastrointestinal:  Positive for abdominal pain, diarrhea, heartburn and nausea.  Genitourinary:  Positive for frequency.  Skin:  Positive for itching.  Neurological:  Positive for tingling and headaches.  Endo/Heme/Allergies:  Does not bruise/bleed easily.  Psychiatric/Behavioral:  Positive for depression.      Physical Exam Blood pressure (!) 147/87, pulse 91, temperature 98 F (36.7 C), height 5\' 5"  (1.651 m), weight 201 lb (91.2 kg), SpO2 99 %. Last Weight  Most recent update: 02/26/2022 11:16 AM    Weight  91.2 kg (201 lb)             CONSTITUTIONAL: Well developed, and nourished, appropriately responsive and aware without distress.   EYES: Sclera non-icteric.   EARS, NOSE, MOUTH AND THROAT:  The oropharynx is clear. Oral mucosa is pink and moist.  Dentition: poor   Hearing is intact to voice.  NECK: Trachea is midline, and there is no jugular venous distension.  LYMPH NODES:  Lymph nodes in the neck are not appreciated. RESPIRATORY:  Lungs are clear, and breath sounds are equal bilaterally. Normal respiratory effort without pathologic use of  accessory muscles. CARDIOVASCULAR: Heart is regular in rate and rhythm.  Well perfused.  GI: The abdomen is tender to RUQ/epigastrium, o/w soft, nontender, and nondistended. There were no palpable masses. I did not appreciate hepatosplenomegaly.  MUSCULOSKELETAL:  Symmetrical muscle tone appreciated in all four extremities.    SKIN: Skin turgor is normal. No pathologic skin lesions appreciated.  NEUROLOGIC:  Motor and sensation appear grossly normal.  Cranial nerves are grossly without defect. PSYCH:  Alert and oriented to person, place and  time. Affect is appropriate for situation.  Data Reviewed I have personally reviewed what is currently available of the patient's imaging, recent labs and medical records.   Labs:     Latest Ref Rng & Units 10/15/2021   10:00 AM 02/21/2021    1:48 PM 12/04/2020   10:15 AM  CBC  WBC 4.0 - 10.5 K/uL 8.9  8.1  8.8   Hemoglobin 12.0 - 15.0 g/dL 45.414.7  09.814.7  11.913.6   Hematocrit 36.0 - 46.0 % 44.2  43.3  41.0   Platelets 150.0 - 400.0 K/uL 318.0  297  273.0       Latest Ref Rng & Units 01/09/2022   11:48 AM 10/15/2021   10:00 AM 02/21/2021    1:48 PM  CMP  Glucose 70 - 99 mg/dL 99  147105  [id number]   BUN 8 - 27 mg/dL 12  14  9    Creatinine 0.57 - 1.00 mg/dL 5.620.70  1.300.79  8.650.71   Sodium 134 - 144 mmol/L 141  139  139   Potassium 3.5 - 5.2 mmol/L 3.4  3.2  3.4   Chloride 96 - 106 mmol/L 101  100  106   CO2 20 - 29 mmol/L 27  29  23    Calcium 8.7 - 10.3 mg/dL 9.3  9.7  8.9   Total Protein 6.0 - 8.3 g/dL  7.9    Total Bilirubin 0.2 - 1.2 mg/dL  1.1    Alkaline Phos 39 - 117 U/L  81    AST 0 - 37 U/L  23    ALT 0 - 35 U/L  15        Imaging: Radiological images reviewed:  CLINICAL DATA:  Epigastric pain.   EXAM: ABDOMEN ULTRASOUND COMPLETE   COMPARISON:  CT abdomen pelvis dated 01/15/2020.   FINDINGS: Gallbladder: Multiple gallstones are seen with the largest measuring 1.2 cm. No gallbladder wall thickening visualized. No sonographic Murphy sign noted by  sonographer.   Common bile duct: Diameter: 3 mm   Liver: No focal lesion identified. Increased parenchymal echogenicity. Portal vein is patent on color Doppler imaging with normal direction of blood flow towards the liver.   IVC: No abnormality visualized.   Pancreas: Visualized portion unremarkable.   Spleen: Size and appearance within normal limits.   Right Kidney: Length: 9.5 cm. Echogenicity within normal limits. No mass or hydronephrosis visualized.   Left Kidney: Length: 10.3 cm. Echogenicity within normal limits. No mass or hydronephrosis visualized.   Abdominal aorta: No aneurysm visualized.   Other findings: None.   IMPRESSION: 1. Cholelithiasis with no evidence of acute cholecystitis. 2. The echogenicity of the liver is increased. This is a nonspecific finding but is most commonly seen with fatty infiltration of the liver. There are no obvious focal liver lesions.     Electronically Signed   By: Romona Curlsyler  Litton M.D.   On: 02/09/2022 14:51 Within last 24 hrs: No results found.  Assessment    Chronic calculus cholecystitis with biliary colic  Patient Active Problem List   Diagnosis Date Noted   Gall stones 01/20/2022   Aortic atherosclerosis (HCC) 01/20/2022   Acute bronchitis 11/17/2021   Acute pulmonary embolism (HCC) 01/16/2020   Pulmonary embolism (HCC) 01/15/2020   Chest wall pain 12/08/2018   Noncompliance 12/08/2018   COPD with acute exacerbation (HCC) 02/04/2018   Fatigue 12/08/2017   Atelectasis 11/25/2017   Cough variant asthma vs uacs/vcd 07/18/2017   DOE (dyspnea on exertion) 07/16/2017   Fibromyalgia 05/03/2017  Migraines 04/12/2017   Chronic respiratory failure with hypoxia (HCC) 04/01/2017   Allergic rhinitis 03/13/2016   Vocal cord dysfunction 01/29/2016   COPD (chronic obstructive pulmonary disease) (HCC) 12/19/2015   Obesity 12/19/2015   Bipolar I disorder (HCC) 08/27/2015   INSOMNIA, CHRONIC 11/03/2006   Depression 11/03/2006    Essential hypertension 11/03/2006   MENOPAUSAL SYNDROME 11/03/2006   Osteoarthritis 11/03/2006    Plan    This was discussed thoroughly.  Optimal plan is for robotic cholecystectomy utilizing ICG imaging. She appears to be miserable, and though not technically acute, I feel she will be benefited by getting her gallbladder out sooner than later. Risks and benefits have been discussed with the patient which include but are not limited to anesthesia, bleeding, infection, biliary ductal injury, resulting in leak or stenosis, other associated unanticipated injuries affiliated with laparoscopic surgery.   Reviewed that removing the gallbladder will only address the symptoms related to the gallbladder itself.  I believe there is the desire to proceed, accepting the risks with understanding.  Questions elicited and answered to satisfaction.    No guarantees ever expressed or implied.   Face-to-face time spent with the patient and accompanying care providers(if present) was 45 minutes, with more than 50% of the time spent counseling, educating, and coordinating care of the patient.    These notes generated with voice recognition software. I apologize for typographical errors.  Campbell Lerner M.D., FACS 02/26/2022, 11:28 AM

## 2022-02-25 NOTE — Progress Notes (Unsigned)
Patient ID: Laurie Schaefer, female   DOB: June 18, 1955, 66 y.o.   MRN: [id number]  Chief Complaint: Epigastric pain  History of Present Illness Laurie Schaefer is a 66 y.o. female with a 57-month history of postprandial epigastric pain which radiates to the back.  Associated nausea and vomiting.  She reports being up all night having pain that was not resolving.  At 4 AM she forced herself to eat a ham sandwich which only exacerbated her pain.  She has not had anything to eat since, but had a small sip of Gatorade while in the office.  She reports her last dose of Eliquis was last night at 10 PM.  She has a history of pulmonary embolism for which she takes the blood thinner.  She denies any history of jaundice, melena, hematochezia, hematemesis.  She denies any biliary emesis, and may have once had a light-colored stool.  She otherwise has had no fevers or chills.  Past Medical History Past Medical History:  Diagnosis Date   Arthritis    Bipolar disorder (HCC)    Chronic neck pain    had previously seen Dr. Salli Real at [facility]    COPD (chronic obstructive pulmonary disease) [facility])    sees Dr. Delton Coombes    Depression    Fibromyalgia    Headache    Hypertension       Past Surgical History:  Procedure Laterality Date   COLONOSCOPY  08/29/2021   per Dr. Carney Harder in [facility] [place]   FRACTURE SURGERY      Allergies  Allergen Reactions   Ciprofloxacin Other (See Comments)    Per patient makes her "wacko"    Codeine Nausea Only   Latex Itching    Current Outpatient Medications  Medication Sig Dispense Refill   albuterol (VENTOLIN HFA) 108 (90 Base) MCG/ACT inhaler INHALE 2 PUFFS INTO THE LUNGS EVERY 6 HOURS AS NEEDED FOR WHEEZING OR SHORTNESS OF BREATH 8.5 g 3   ARIPiprazole (ABILIFY) 20 MG tablet Take 1 tablet (20 mg total) by mouth daily. 90 tablet 3   atorvastatin (LIPITOR) 10 MG tablet Take 1 tablet (10 mg total) by mouth daily. 90 tablet 3    budesonide-formoterol (SYMBICORT) 160-4.5 MCG/ACT inhaler Inhale 2 puffs into the lungs in the morning and at bedtime. 1 each 12   ELIQUIS 5 MG TABS tablet TAKE 1 TABLET BY MOUTH TWICE A DAY 180 tablet 3   LORazepam (ATIVAN) 2 MG tablet TAKE 1 TABLET(2 MG) BY MOUTH EVERY 6 HOURS AS NEEDED FOR ANXIETY 120 tablet 5   losartan-hydrochlorothiazide (HYZAAR) 100-25 MG tablet Take 0.5 tablets by mouth daily. 45 tablet 3   methocarbamol (ROBAXIN) 500 MG tablet Take 1 tablet (500 mg total) by mouth every 8 (eight) hours as needed for muscle spasms. 8 tablet 0   metoprolol tartrate (LOPRESSOR) 100 MG tablet Take 1 tablet by mouth once for procedure. 1 tablet 0   ondansetron (ZOFRAN) 8 MG tablet Take 1 tablet (8 mg total) by mouth every 6 (six) hours as needed for nausea or vomiting. 60 tablet 1   OXYGEN Place 3 L into the nose continuous.     pantoprazole (PROTONIX) 40 MG tablet Take 1 tablet (40 mg total) by mouth daily. 90 tablet 3   polyethylene glycol powder (GLYCOLAX/MIRALAX) 17 GM/SCOOP powder Take 17 g by mouth 2 (two) times daily as needed. 3350 g 5   potassium chloride (KLOR-CON) 10 MEQ tablet Take 1 tablet (10 mEq total) by  mouth daily. 90 tablet 3   prazosin (MINIPRESS) 2 MG capsule Take 1 capsule (2 mg total) by mouth at bedtime. (Patient taking differently: Take 2 mg by mouth at bedtime as needed (sleep).) 90 capsule 3   pregabalin (LYRICA) 200 MG capsule Take 1 capsule (200 mg total) by mouth at bedtime. 30 capsule 5   pregabalin (LYRICA) 50 MG capsule Take one capsule in the morning and one in the afternoon 180 capsule 5   Psyllium 25 % POWD Take 1-2 teaspoons once daily with first meal of the day. 425 g 3   SUMAtriptan (IMITREX) 100 MG tablet TAKE 1 TABLET BY MOUTH AS NEEDED FOR MIGRAINE. MAY REPEAT IN 2 HOURS IF HEADACHE PERSISTS OR RECURS. 9 tablet 2   Tiotropium Bromide Monohydrate (SPIRIVA RESPIMAT) 2.5 MCG/ACT AERS Inhale 2 puffs into the lungs daily. 1 each 12   zolpidem (AMBIEN) 10 MG  tablet TAKE 1 TABLET BY MOUTH AT BEDTIME AS NEEDED FOR SLEEP. 30 tablet 5   No current facility-administered medications for this visit.    Family History Family History  Problem Relation Age of Onset   Depression Mother    Hypertension Mother    Stroke Father    Hypertension Father       Social History Social History   Tobacco Use   Smoking status: Never    Passive exposure: Never   Smokeless tobacco: Never  Vaping Use   Vaping Use: Never used  Substance Use Topics   Alcohol use: No    Alcohol/week: 0.0 standard drinks of alcohol   Drug use: No        Review of Systems  Constitutional:  Positive for diaphoresis and malaise/fatigue. Negative for weight loss.  HENT: Negative.    Eyes: Negative.   Respiratory:  Positive for cough, shortness of breath and wheezing.   Cardiovascular:  Positive for chest pain.  Gastrointestinal:  Positive for abdominal pain, diarrhea, heartburn and nausea.  Genitourinary:  Positive for frequency.  Skin:  Positive for itching.  Neurological:  Positive for tingling and headaches.  Endo/Heme/Allergies:  Does not bruise/bleed easily.  Psychiatric/Behavioral:  Positive for depression.      Physical Exam Blood pressure (!) 147/87, pulse 91, temperature 98 F (36.7 C), height 5\' 5"  (1.651 m), weight 201 lb (91.2 kg), SpO2 99 %. Last Weight  Most recent update: 02/26/2022 11:16 AM    Weight  91.2 kg (201 lb)             CONSTITUTIONAL: Well developed, and nourished, appropriately responsive and aware without distress.   EYES: Sclera non-icteric.   EARS, NOSE, MOUTH AND THROAT:  The oropharynx is clear. Oral mucosa is pink and moist.  Dentition: poor   Hearing is intact to voice.  NECK: Trachea is midline, and there is no jugular venous distension.  LYMPH NODES:  Lymph nodes in the neck are not appreciated. RESPIRATORY:  Lungs are clear, and breath sounds are equal bilaterally. Normal respiratory effort without pathologic use of  accessory muscles. CARDIOVASCULAR: Heart is regular in rate and rhythm.  Well perfused.  GI: The abdomen is tender to RUQ/epigastrium, o/w soft, nontender, and nondistended. There were no palpable masses. I did not appreciate hepatosplenomegaly.  MUSCULOSKELETAL:  Symmetrical muscle tone appreciated in all four extremities.    SKIN: Skin turgor is normal. No pathologic skin lesions appreciated.  NEUROLOGIC:  Motor and sensation appear grossly normal.  Cranial nerves are grossly without defect. PSYCH:  Alert and oriented to person, place and  time. Affect is appropriate for situation.  Data Reviewed I have personally reviewed what is currently available of the patient's imaging, recent labs and medical records.   Labs:     Latest Ref Rng & Units 10/15/2021   10:00 AM 02/21/2021    1:48 PM 12/04/2020   10:15 AM  CBC  WBC 4.0 - 10.5 K/uL 8.9  8.1  8.8   Hemoglobin 12.0 - 15.0 g/dL 14.7  14.7  13.6   Hematocrit 36.0 - 46.0 % 44.2  43.3  41.0   Platelets 150.0 - 400.0 K/uL 318.0  297  273.0       Latest Ref Rng & Units 01/09/2022   11:48 AM 10/15/2021   10:00 AM 02/21/2021    1:48 PM  CMP  Glucose 70 - 99 mg/dL 99  105  110   BUN 8 - 27 mg/dL 12  14  9   Creatinine 0.57 - 1.00 mg/dL 0.70  0.79  0.71   Sodium 134 - 144 mmol/L 141  139  139   Potassium 3.5 - 5.2 mmol/L 3.4  3.2  3.4   Chloride 96 - 106 mmol/L 101  100  106   CO2 20 - 29 mmol/L 27  29  23   Calcium 8.7 - 10.3 mg/dL 9.3  9.7  8.9   Total Protein 6.0 - 8.3 g/dL  7.9    Total Bilirubin 0.2 - 1.2 mg/dL  1.1    Alkaline Phos 39 - 117 U/L  81    AST 0 - 37 U/L  23    ALT 0 - 35 U/L  15        Imaging: Radiological images reviewed:  CLINICAL DATA:  Epigastric pain.   EXAM: ABDOMEN ULTRASOUND COMPLETE   COMPARISON:  CT abdomen pelvis dated 01/15/2020.   FINDINGS: Gallbladder: Multiple gallstones are seen with the largest measuring 1.2 cm. No gallbladder wall thickening visualized. No sonographic Murphy sign noted by  sonographer.   Common bile duct: Diameter: 3 mm   Liver: No focal lesion identified. Increased parenchymal echogenicity. Portal vein is patent on color Doppler imaging with normal direction of blood flow towards the liver.   IVC: No abnormality visualized.   Pancreas: Visualized portion unremarkable.   Spleen: Size and appearance within normal limits.   Right Kidney: Length: 9.5 cm. Echogenicity within normal limits. No mass or hydronephrosis visualized.   Left Kidney: Length: 10.3 cm. Echogenicity within normal limits. No mass or hydronephrosis visualized.   Abdominal aorta: No aneurysm visualized.   Other findings: None.   IMPRESSION: 1. Cholelithiasis with no evidence of acute cholecystitis. 2. The echogenicity of the liver is increased. This is a nonspecific finding but is most commonly seen with fatty infiltration of the liver. There are no obvious focal liver lesions.     Electronically Signed   By: Tyler  Litton M.D.   On: 02/09/2022 14:51 Within last 24 hrs: No results found.  Assessment    Chronic calculus cholecystitis with biliary colic  Patient Active Problem List   Diagnosis Date Noted   Gall stones 01/20/2022   Aortic atherosclerosis (HCC) 01/20/2022   Acute bronchitis 11/17/2021   Acute pulmonary embolism (HCC) 01/16/2020   Pulmonary embolism (HCC) 01/15/2020   Chest wall pain 12/08/2018   Noncompliance 12/08/2018   COPD with acute exacerbation (HCC) 02/04/2018   Fatigue 12/08/2017   Atelectasis 11/25/2017   Cough variant asthma vs uacs/vcd 07/18/2017   DOE (dyspnea on exertion) 07/16/2017   Fibromyalgia 05/03/2017     Migraines 04/12/2017   Chronic respiratory failure with hypoxia (HCC) 04/01/2017   Allergic rhinitis 03/13/2016   Vocal cord dysfunction 01/29/2016   COPD (chronic obstructive pulmonary disease) (HCC) 12/19/2015   Obesity 12/19/2015   Bipolar I disorder (HCC) 08/27/2015   INSOMNIA, CHRONIC 11/03/2006   Depression 11/03/2006    Essential hypertension 11/03/2006   MENOPAUSAL SYNDROME 11/03/2006   Osteoarthritis 11/03/2006    Plan    This was discussed thoroughly.  Optimal plan is for robotic cholecystectomy utilizing ICG imaging. She appears to be miserable, and though not technically acute, I feel she will be benefited by getting her gallbladder out sooner than later. Risks and benefits have been discussed with the patient which include but are not limited to anesthesia, bleeding, infection, biliary ductal injury, resulting in leak or stenosis, other associated unanticipated injuries affiliated with laparoscopic surgery.   Reviewed that removing the gallbladder will only address the symptoms related to the gallbladder itself.  I believe there is the desire to proceed, accepting the risks with understanding.  Questions elicited and answered to satisfaction.    No guarantees ever expressed or implied.   Face-to-face time spent with the patient and accompanying care providers(if present) was 45 minutes, with more than 50% of the time spent counseling, educating, and coordinating care of the patient.    These notes generated with voice recognition software. I apologize for typographical errors.  Campbell Lerner M.D., FACS 02/26/2022, 11:28 AM

## 2022-02-26 ENCOUNTER — Ambulatory Visit: Payer: Medicare HMO | Admitting: Certified Registered Nurse Anesthetist

## 2022-02-26 ENCOUNTER — Encounter: Payer: Self-pay | Admitting: Surgery

## 2022-02-26 ENCOUNTER — Encounter
Admission: RE | Disposition: A | Discharge status: Home / Self Care | Payer: Self-pay | Source: Ambulatory Visit | Attending: Surgery

## 2022-02-26 ENCOUNTER — Other Ambulatory Visit: Payer: Self-pay

## 2022-02-26 ENCOUNTER — Ambulatory Visit (INDEPENDENT_AMBULATORY_CARE_PROVIDER_SITE_OTHER): Payer: Medicare HMO | Admitting: Surgery

## 2022-02-26 ENCOUNTER — Telehealth: Payer: Self-pay | Admitting: Surgery

## 2022-02-26 ENCOUNTER — Observation Stay
Admission: RE | Admit: 2022-02-26 | Discharge: 2022-02-27 | Disposition: A | Discharge status: Home / Self Care | Payer: Medicare HMO | Source: Ambulatory Visit | Attending: Surgery | Admitting: Surgery

## 2022-02-26 ENCOUNTER — Ambulatory Visit: Payer: Self-pay | Admitting: Surgery

## 2022-02-26 VITALS — BP 147/87 | HR 91 | Temp 98.0°F | Ht 65.0 in | Wt 201.0 lb

## 2022-02-26 DIAGNOSIS — J449 Chronic obstructive pulmonary disease, unspecified: Secondary | ICD-10-CM | POA: Diagnosis not present

## 2022-02-26 DIAGNOSIS — K8010 Calculus of gallbladder with chronic cholecystitis without obstruction: Secondary | ICD-10-CM

## 2022-02-26 DIAGNOSIS — Z7901 Long term (current) use of anticoagulants: Secondary | ICD-10-CM | POA: Insufficient documentation

## 2022-02-26 DIAGNOSIS — K8012 Calculus of gallbladder with acute and chronic cholecystitis without obstruction: Principal | ICD-10-CM | POA: Insufficient documentation

## 2022-02-26 DIAGNOSIS — Z91040 Latex allergy status: Secondary | ICD-10-CM | POA: Diagnosis not present

## 2022-02-26 DIAGNOSIS — K8064 Calculus of gallbladder and bile duct with chronic cholecystitis without obstruction: Secondary | ICD-10-CM | POA: Diagnosis not present

## 2022-02-26 DIAGNOSIS — I10 Essential (primary) hypertension: Secondary | ICD-10-CM | POA: Diagnosis not present

## 2022-02-26 DIAGNOSIS — E669 Obesity, unspecified: Secondary | ICD-10-CM | POA: Diagnosis not present

## 2022-02-26 DIAGNOSIS — Z79899 Other long term (current) drug therapy: Secondary | ICD-10-CM | POA: Insufficient documentation

## 2022-02-26 DIAGNOSIS — Z9049 Acquired absence of other specified parts of digestive tract: Secondary | ICD-10-CM

## 2022-02-26 DIAGNOSIS — Z6833 Body mass index (BMI) 33.0-33.9, adult: Secondary | ICD-10-CM | POA: Diagnosis not present

## 2022-02-26 LAB — COMPREHENSIVE METABOLIC PANEL
ALT: 16 U/L (ref 0–44)
AST: 27 U/L (ref 15–41)
Albumin: 3.8 g/dL (ref 3.5–5.0)
Alkaline Phosphatase: 65 U/L (ref 38–126)
Anion gap: 9 (ref 5–15)
BUN: 8 mg/dL (ref 8–23)
CO2: 22 mmol/L (ref 22–32)
Calcium: 8.9 mg/dL (ref 8.9–10.3)
Chloride: 111 mmol/L (ref 98–111)
Creatinine, Ser: 0.62 mg/dL (ref 0.44–1.00)
GFR, Estimated: >60 mL/min (ref >60)
Glucose, Bld: 113 mg/dL — ABNORMAL HIGH (ref 70–99)
Potassium: 3.6 mmol/L (ref 3.5–5.1)
Sodium: 142 mmol/L (ref 135–145)
Total Bilirubin: 1.4 mg/dL — ABNORMAL HIGH (ref 0.3–1.2)
Total Protein: 7.4 g/dL (ref 6.5–8.1)

## 2022-02-26 LAB — CBC WITH DIFFERENTIAL/PLATELET
Abs Immature Granulocytes: 0.03 10*3/uL (ref 0.00–0.07)
Basophils Absolute: 0.1 10*3/uL (ref 0.0–0.1)
Basophils Relative: 1 %
Eosinophils Absolute: 0.2 10*3/uL (ref 0.0–0.5)
Eosinophils Relative: 2 %
HCT: 39.6 % (ref 36.0–46.0)
Hemoglobin: 13.5 g/dL (ref 12.0–15.0)
Immature Granulocytes: 0 %
Lymphocytes Relative: 31 %
Lymphs Abs: 2.9 10*3/uL (ref 0.7–4.0)
MCH: 29 pg (ref 26.0–34.0)
MCHC: 34.1 g/dL (ref 30.0–36.0)
MCV: 85.2 fL (ref 80.0–100.0)
Monocytes Absolute: 0.9 10*3/uL (ref 0.1–1.0)
Monocytes Relative: 9 %
Neutro Abs: 5.4 10*3/uL (ref 1.7–7.7)
Neutrophils Relative %: 57 %
Platelets: 307 10*3/uL (ref 150–400)
RBC: 4.65 MIL/uL (ref 3.87–5.11)
RDW: 13.5 % (ref 11.5–15.5)
WBC: 9.5 10*3/uL (ref 4.0–10.5)
nRBC: 0 % (ref 0.0–0.2)

## 2022-02-26 SURGERY — CHOLECYSTECTOMY, ROBOT-ASSISTED, LAPAROSCOPIC
Anesthesia: General

## 2022-02-26 MED ORDER — ACETAMINOPHEN 500 MG PO TABS
ORAL_TABLET | ORAL | Status: AC
  Administered 2022-02-26: 1000 mg via ORAL
  Filled 2022-02-26: qty 2

## 2022-02-26 MED ORDER — METHOCARBAMOL 500 MG PO TABS: 500.0000 mg | ORAL_TABLET | Freq: Three times a day (TID) | ORAL | Status: DC | PRN

## 2022-02-26 MED ORDER — CHLORHEXIDINE GLUCONATE CLOTH 2 % EX PADS: 6.0000 | MEDICATED_PAD | Freq: Once | CUTANEOUS | Status: DC

## 2022-02-26 MED ORDER — ATORVASTATIN CALCIUM 20 MG PO TABS
10.0000 mg | ORAL_TABLET | Freq: Every day | ORAL | Status: DC
  Administered 2022-02-27: 10 mg via ORAL
  Filled 2022-02-26: qty 0.5

## 2022-02-26 MED ORDER — EPHEDRINE SULFATE (PRESSORS) 50 MG/ML IJ SOLN
INTRAMUSCULAR | Status: DC | PRN
  Administered 2022-02-26: 10 mg via INTRAVENOUS

## 2022-02-26 MED ORDER — ONDANSETRON HCL 4 MG/2ML IJ SOLN
INTRAMUSCULAR | Status: AC
  Filled 2022-02-26: qty 2

## 2022-02-26 MED ORDER — HYDROCHLOROTHIAZIDE 25 MG PO TABS
25.0000 mg | ORAL_TABLET | Freq: Every day | ORAL | Status: DC
  Administered 2022-02-27: 25 mg via ORAL
  Filled 2022-02-26: qty 1

## 2022-02-26 MED ORDER — FENTANYL CITRATE (PF) 100 MCG/2ML IJ SOLN
INTRAMUSCULAR | Status: AC
  Administered 2022-02-26: 25 ug via INTRAVENOUS
  Filled 2022-02-26: qty 2

## 2022-02-26 MED ORDER — ONDANSETRON HCL 4 MG/2ML IJ SOLN
INTRAMUSCULAR | Status: DC | PRN
  Administered 2022-02-26: 4 mg via INTRAVENOUS

## 2022-02-26 MED ORDER — INDOCYANINE GREEN 25 MG IV SOLR
1.2500 mg | Freq: Once | INTRAVENOUS | Status: AC
  Administered 2022-02-26: 1.25 mg via INTRAVENOUS
  Filled 2022-02-26: qty 0.5

## 2022-02-26 MED ORDER — CEFAZOLIN SODIUM-DEXTROSE 2-4 GM/100ML-% IV SOLN
2.0000 g | Freq: Three times a day (TID) | INTRAVENOUS | Status: AC
  Administered 2022-02-27: 2 g via INTRAVENOUS
  Filled 2022-02-26: qty 100

## 2022-02-26 MED ORDER — LORAZEPAM 1 MG PO TABS
2.0000 mg | ORAL_TABLET | Freq: Four times a day (QID) | ORAL | Status: DC | PRN
  Administered 2022-02-26: 2 mg via ORAL
  Filled 2022-02-26: qty 2

## 2022-02-26 MED ORDER — CHLORHEXIDINE GLUCONATE CLOTH 2 % EX PADS
6.0000 | MEDICATED_PAD | Freq: Once | CUTANEOUS | Status: AC
  Administered 2022-02-26: 6 via TOPICAL

## 2022-02-26 MED ORDER — FENTANYL CITRATE (PF) 100 MCG/2ML IJ SOLN
INTRAMUSCULAR | Status: AC
  Filled 2022-02-26: qty 2

## 2022-02-26 MED ORDER — GABAPENTIN 300 MG PO CAPS
ORAL_CAPSULE | ORAL | Status: AC
  Administered 2022-02-26: 300 mg via ORAL
  Filled 2022-02-26: qty 1

## 2022-02-26 MED ORDER — DEXAMETHASONE SODIUM PHOSPHATE 10 MG/ML IJ SOLN
INTRAMUSCULAR | Status: AC
  Filled 2022-02-26: qty 1

## 2022-02-26 MED ORDER — DEXAMETHASONE SODIUM PHOSPHATE 10 MG/ML IJ SOLN
INTRAMUSCULAR | Status: DC | PRN
  Administered 2022-02-26: 10 mg via INTRAVENOUS

## 2022-02-26 MED ORDER — PRAZOSIN HCL 2 MG PO CAPS
2.0000 mg | ORAL_CAPSULE | Freq: Every day | ORAL | Status: DC
  Administered 2022-02-26: 2 mg via ORAL
  Filled 2022-02-26: qty 1

## 2022-02-26 MED ORDER — ARIPIPRAZOLE 10 MG PO TABS
20.0000 mg | ORAL_TABLET | Freq: Every day | ORAL | Status: DC
  Administered 2022-02-26 – 2022-02-27 (×2): 20 mg via ORAL
  Filled 2022-02-26 (×2): qty 2

## 2022-02-26 MED ORDER — POLYETHYLENE GLYCOL 3350 17 G PO PACK
17.0000 g | PACK | Freq: Once | ORAL | Status: DC
  Filled 2022-02-26: qty 1

## 2022-02-26 MED ORDER — PANTOPRAZOLE SODIUM 40 MG IV SOLR
40.0000 mg | Freq: Every day | INTRAVENOUS | Status: DC
  Administered 2022-02-26: 40 mg via INTRAVENOUS
  Filled 2022-02-26: qty 10

## 2022-02-26 MED ORDER — FENTANYL CITRATE (PF) 100 MCG/2ML IJ SOLN
25.0000 ug | INTRAMUSCULAR | Status: AC | PRN
  Administered 2022-02-26 (×6): 25 ug via INTRAVENOUS

## 2022-02-26 MED ORDER — LOSARTAN POTASSIUM 50 MG PO TABS
100.0000 mg | ORAL_TABLET | Freq: Every day | ORAL | Status: DC
  Administered 2022-02-27: 100 mg via ORAL
  Filled 2022-02-26: qty 2

## 2022-02-26 MED ORDER — POLYETHYLENE GLYCOL 3350 17 GM/SCOOP PO POWD
17.0000 g | Freq: Once | ORAL | Status: DC
  Filled 2022-02-26: qty 255

## 2022-02-26 MED ORDER — POTASSIUM CHLORIDE CRYS ER 20 MEQ PO TBCR
10.0000 meq | EXTENDED_RELEASE_TABLET | Freq: Every day | ORAL | Status: DC
  Administered 2022-02-26 – 2022-02-27 (×2): 10 meq via ORAL
  Filled 2022-02-26 (×2): qty 1

## 2022-02-26 MED ORDER — CHLORHEXIDINE GLUCONATE 0.12 % MT SOLN: 15.0000 mL | Freq: Once | OROMUCOSAL | Status: AC

## 2022-02-26 MED ORDER — SODIUM CHLORIDE 0.9 % IV SOLN: INTRAVENOUS | Status: DC

## 2022-02-26 MED ORDER — DIPHENHYDRAMINE HCL 50 MG/ML IJ SOLN: 12.5000 mg | Freq: Four times a day (QID) | INTRAMUSCULAR | Status: DC | PRN

## 2022-02-26 MED ORDER — ONDANSETRON HCL 4 MG/2ML IJ SOLN: 4.0000 mg | Freq: Once | INTRAMUSCULAR | Status: DC | PRN

## 2022-02-26 MED ORDER — DEXMEDETOMIDINE HCL IN NACL 80 MCG/20ML IV SOLN
INTRAVENOUS | Status: DC | PRN
  Administered 2022-02-26: 12 ug via BUCCAL

## 2022-02-26 MED ORDER — CHLORHEXIDINE GLUCONATE 0.12 % MT SOLN
OROMUCOSAL | Status: AC
  Administered 2022-02-26: 15 mL via OROMUCOSAL
  Filled 2022-02-26: qty 15

## 2022-02-26 MED ORDER — PROPOFOL 10 MG/ML IV BOLUS
INTRAVENOUS | Status: DC | PRN
  Administered 2022-02-26: 50 mg via INTRAVENOUS
  Administered 2022-02-26: 150 mg via INTRAVENOUS

## 2022-02-26 MED ORDER — BUPIVACAINE LIPOSOME 1.3 % IJ SUSP: 20.0000 mL | Freq: Once | INTRAMUSCULAR | Status: DC

## 2022-02-26 MED ORDER — ORAL CARE MOUTH RINSE: 15.0000 mL | Freq: Once | OROMUCOSAL | Status: AC

## 2022-02-26 MED ORDER — ZOLPIDEM TARTRATE 5 MG PO TABS: 5.0000 mg | ORAL_TABLET | Freq: Every evening | ORAL | Status: DC | PRN

## 2022-02-26 MED ORDER — DIPHENHYDRAMINE HCL 12.5 MG/5ML PO ELIX: 12.5000 mg | ORAL_SOLUTION | Freq: Four times a day (QID) | ORAL | Status: DC | PRN

## 2022-02-26 MED ORDER — MOMETASONE FURO-FORMOTEROL FUM 200-5 MCG/ACT IN AERO: 2.0000 | INHALATION_SPRAY | Freq: Two times a day (BID) | RESPIRATORY_TRACT | Status: DC

## 2022-02-26 MED ORDER — KETOROLAC TROMETHAMINE 15 MG/ML IJ SOLN
INTRAMUSCULAR | Status: AC
  Filled 2022-02-26: qty 1

## 2022-02-26 MED ORDER — LIDOCAINE HCL (PF) 2 % IJ SOLN
INTRAMUSCULAR | Status: AC
  Filled 2022-02-26: qty 5

## 2022-02-26 MED ORDER — CEFAZOLIN SODIUM-DEXTROSE 2-4 GM/100ML-% IV SOLN
2.0000 g | INTRAVENOUS | Status: AC
  Administered 2022-02-26: 2 g via INTRAVENOUS

## 2022-02-26 MED ORDER — APIXABAN 2.5 MG PO TABS
5.0000 mg | ORAL_TABLET | Freq: Two times a day (BID) | ORAL | Status: DC
  Administered 2022-02-26 – 2022-02-27 (×2): 5 mg via ORAL
  Filled 2022-02-26 (×2): qty 2

## 2022-02-26 MED ORDER — LOSARTAN POTASSIUM-HCTZ 100-25 MG PO TABS: 0.5000 | ORAL_TABLET | Freq: Every day | ORAL | Status: DC

## 2022-02-26 MED ORDER — HYDRALAZINE HCL 20 MG/ML IJ SOLN: 10.0000 mg | INTRAMUSCULAR | Status: DC | PRN

## 2022-02-26 MED ORDER — BUPIVACAINE-EPINEPHRINE (PF) 0.25% -1:200000 IJ SOLN
INTRAMUSCULAR | Status: DC | PRN
  Administered 2022-02-26: 20 mL
  Administered 2022-02-26: 10 mL

## 2022-02-26 MED ORDER — ALBUTEROL SULFATE HFA 108 (90 BASE) MCG/ACT IN AERS
INHALATION_SPRAY | RESPIRATORY_TRACT | Status: DC | PRN
  Administered 2022-02-26: 10 via RESPIRATORY_TRACT
  Administered 2022-02-26: 8 via RESPIRATORY_TRACT

## 2022-02-26 MED ORDER — FENTANYL CITRATE (PF) 100 MCG/2ML IJ SOLN
INTRAMUSCULAR | Status: DC | PRN
  Administered 2022-02-26 (×2): 50 ug via INTRAVENOUS

## 2022-02-26 MED ORDER — LACTATED RINGERS IV SOLN: INTRAVENOUS | Status: DC

## 2022-02-26 MED ORDER — ROCURONIUM BROMIDE 100 MG/10ML IV SOLN
INTRAVENOUS | Status: DC | PRN
  Administered 2022-02-26: 50 mg via INTRAVENOUS

## 2022-02-26 MED ORDER — KETOROLAC TROMETHAMINE 15 MG/ML IJ SOLN
15.0000 mg | Freq: Four times a day (QID) | INTRAMUSCULAR | Status: DC | PRN
  Administered 2022-02-27 (×2): 15 mg via INTRAVENOUS
  Filled 2022-02-26 (×5): qty 1

## 2022-02-26 MED ORDER — BUPIVACAINE-EPINEPHRINE (PF) 0.25% -1:200000 IJ SOLN
INTRAMUSCULAR | Status: AC
  Filled 2022-02-26: qty 30

## 2022-02-26 MED ORDER — ONDANSETRON HCL 4 MG/2ML IJ SOLN: 4.0000 mg | Freq: Four times a day (QID) | INTRAMUSCULAR | Status: DC | PRN

## 2022-02-26 MED ORDER — OXYCODONE HCL 5 MG PO TABS
10.0000 mg | ORAL_TABLET | ORAL | Status: DC | PRN
  Administered 2022-02-26 – 2022-02-27 (×3): 10 mg via ORAL
  Filled 2022-02-26 (×3): qty 2

## 2022-02-26 MED ORDER — PANTOPRAZOLE SODIUM 40 MG PO TBEC: 40.0000 mg | DELAYED_RELEASE_TABLET | Freq: Every day | ORAL | Status: DC

## 2022-02-26 MED ORDER — PREGABALIN 50 MG PO CAPS
50.0000 mg | ORAL_CAPSULE | Freq: Two times a day (BID) | ORAL | Status: DC
  Filled 2022-02-26: qty 1

## 2022-02-26 MED ORDER — TIOTROPIUM BROMIDE MONOHYDRATE 18 MCG IN CAPS: 18.0000 ug | ORAL_CAPSULE | Freq: Every day | RESPIRATORY_TRACT | Status: DC

## 2022-02-26 MED ORDER — BUPIVACAINE LIPOSOME 1.3 % IJ SUSP
INTRAMUSCULAR | Status: AC
  Filled 2022-02-26: qty 20

## 2022-02-26 MED ORDER — MIDAZOLAM HCL 2 MG/2ML IJ SOLN
INTRAMUSCULAR | Status: DC | PRN
  Administered 2022-02-26: 2 mg via INTRAVENOUS

## 2022-02-26 MED ORDER — SUGAMMADEX SODIUM 200 MG/2ML IV SOLN
INTRAVENOUS | Status: DC | PRN
  Administered 2022-02-26: 200 mg via INTRAVENOUS

## 2022-02-26 MED ORDER — PROPOFOL 10 MG/ML IV BOLUS
INTRAVENOUS | Status: AC
  Filled 2022-02-26: qty 20

## 2022-02-26 MED ORDER — ROCURONIUM BROMIDE 10 MG/ML (PF) SYRINGE
PREFILLED_SYRINGE | INTRAVENOUS | Status: AC
  Filled 2022-02-26: qty 10

## 2022-02-26 MED ORDER — ALBUTEROL SULFATE (2.5 MG/3ML) 0.083% IN NEBU: 3.0000 mL | INHALATION_SOLUTION | Freq: Four times a day (QID) | RESPIRATORY_TRACT | Status: DC | PRN

## 2022-02-26 MED ORDER — KETOROLAC TROMETHAMINE 15 MG/ML IJ SOLN
15.0000 mg | Freq: Four times a day (QID) | INTRAMUSCULAR | Status: AC
  Administered 2022-02-26: 15 mg via INTRAVENOUS

## 2022-02-26 MED ORDER — CEFAZOLIN SODIUM-DEXTROSE 2-4 GM/100ML-% IV SOLN
INTRAVENOUS | Status: AC
  Filled 2022-02-26: qty 100

## 2022-02-26 MED ORDER — ACETAMINOPHEN 500 MG PO TABS: 1000.0000 mg | ORAL_TABLET | ORAL | Status: AC

## 2022-02-26 MED ORDER — BUPIVACAINE LIPOSOME 1.3 % IJ SUSP
INTRAMUSCULAR | Status: DC | PRN
  Administered 2022-02-26: 20 mL

## 2022-02-26 MED ORDER — GABAPENTIN 300 MG PO CAPS: 300.0000 mg | ORAL_CAPSULE | ORAL | Status: AC

## 2022-02-26 MED ORDER — LIDOCAINE HCL (CARDIAC) PF 100 MG/5ML IV SOSY
PREFILLED_SYRINGE | INTRAVENOUS | Status: DC | PRN
  Administered 2022-02-26: 100 mg via INTRAVENOUS

## 2022-02-26 MED ORDER — LORAZEPAM 2 MG/ML IJ SOLN
INTRAMUSCULAR | Status: AC
  Filled 2022-02-26: qty 1

## 2022-02-26 MED ORDER — HYDROMORPHONE HCL 1 MG/ML IJ SOLN
0.5000 mg | INTRAMUSCULAR | Status: DC | PRN
  Administered 2022-02-26 – 2022-02-27 (×3): 0.5 mg via INTRAVENOUS
  Filled 2022-02-26 (×4): qty 0.5

## 2022-02-26 MED ORDER — MIDAZOLAM HCL 2 MG/2ML IJ SOLN
INTRAMUSCULAR | Status: AC
  Filled 2022-02-26: qty 2

## 2022-02-26 MED ORDER — PREGABALIN 50 MG PO CAPS
200.0000 mg | ORAL_CAPSULE | Freq: Every day | ORAL | Status: DC
  Administered 2022-02-26: 200 mg via ORAL
  Filled 2022-02-26: qty 4

## 2022-02-26 MED ORDER — ONDANSETRON HCL 4 MG PO TABS: 8.0000 mg | ORAL_TABLET | Freq: Four times a day (QID) | ORAL | Status: DC | PRN

## 2022-02-26 MED ORDER — ONDANSETRON 4 MG PO TBDP: 4.0000 mg | ORAL_TABLET | Freq: Four times a day (QID) | ORAL | Status: DC | PRN

## 2022-02-26 SURGICAL SUPPLY — 46 items
ADH SKN CLS APL DERMABOND .7 (GAUZE/BANDAGES/DRESSINGS) ×1
BAG PRESSURE INF REUSE 3000 (BAG) IMPLANT
CLIP LIGATING HEM O LOK PURPLE (MISCELLANEOUS) ×1 IMPLANT
COVER TIP SHEARS 8 DVNC (MISCELLANEOUS) ×1 IMPLANT
COVER TIP SHEARS 8MM DA VINCI (MISCELLANEOUS) ×1
DERMABOND ADVANCED .7 DNX12 (GAUZE/BANDAGES/DRESSINGS) ×1 IMPLANT
DRAPE ARM DVNC X/XI (DISPOSABLE) ×4 IMPLANT
DRAPE COLUMN DVNC XI (DISPOSABLE) ×1 IMPLANT
DRAPE DA VINCI XI ARM (DISPOSABLE) ×4
DRAPE DA VINCI XI COLUMN (DISPOSABLE) ×1
ELECT CAUTERY BLADE 6.4 (BLADE) ×1 IMPLANT
GLOVE ORTHO TXT STRL SZ7.5 (GLOVE) ×2 IMPLANT
GOWN STRL REUS W/ TWL LRG LVL3 (GOWN DISPOSABLE) ×2 IMPLANT
GOWN STRL REUS W/ TWL XL LVL3 (GOWN DISPOSABLE) ×2 IMPLANT
GOWN STRL REUS W/TWL LRG LVL3 (GOWN DISPOSABLE) ×2
GOWN STRL REUS W/TWL XL LVL3 (GOWN DISPOSABLE) ×2
GRASPER SUT TROCAR 14GX15 (MISCELLANEOUS) IMPLANT
IRRIGATION STRYKERFLOW (MISCELLANEOUS) IMPLANT
IRRIGATOR STRYKERFLOW (MISCELLANEOUS)
IRRIGATOR SUCT 8 DISP DVNC XI (IRRIGATION / IRRIGATOR) IMPLANT
IRRIGATOR SUCTION 8MM XI DISP (IRRIGATION / IRRIGATOR)
IV NS IRRIG 3000ML ARTHROMATIC (IV SOLUTION) IMPLANT
KIT PINK PAD W/HEAD ARE REST (MISCELLANEOUS) ×1 IMPLANT
KIT PINK PAD W/HEAD ARM REST (MISCELLANEOUS) ×1 IMPLANT
KIT TURNOVER KIT A (KITS) ×1 IMPLANT
LABEL OR SOLS (LABEL) ×1 IMPLANT
MANIFOLD NEPTUNE II (INSTRUMENTS) ×1 IMPLANT
NDL INSUFFLATION 14GA 120MM (NEEDLE) IMPLANT
NEEDLE HYPO 22GX1.5 SAFETY (NEEDLE) ×1 IMPLANT
NEEDLE INSUFFLATION 14GA 120MM (NEEDLE) IMPLANT
NS IRRIG 500ML POUR BTL (IV SOLUTION) ×1 IMPLANT
PACK LAP CHOLECYSTECTOMY (MISCELLANEOUS) ×1 IMPLANT
SEAL CANN UNIV 5-8 DVNC XI (MISCELLANEOUS) ×4 IMPLANT
SEAL XI 5MM-8MM UNIVERSAL (MISCELLANEOUS) ×4
SET TUBE SMOKE EVAC HIGH FLOW (TUBING) ×1 IMPLANT
SOLUTION ELECTROLUBE (MISCELLANEOUS) ×1 IMPLANT
SPIKE FLUID TRANSFER (MISCELLANEOUS) ×1 IMPLANT
SUT MNCRL 4-0 (SUTURE) ×1
SUT MNCRL 4-0 27XMFL (SUTURE) ×1
SUT VICRYL 0 UR6 27IN ABS (SUTURE) ×1 IMPLANT
SUTURE MNCRL 4-0 27XMF (SUTURE) ×1 IMPLANT
SYS BAG RETRIEVAL 10MM (BASKET) ×1
SYSTEM BAG RETRIEVAL 10MM (BASKET) ×1 IMPLANT
TRAP FLUID SMOKE EVACUATOR (MISCELLANEOUS) ×1 IMPLANT
TROCAR Z-THREAD FIOS 11X100 BL (TROCAR) IMPLANT
WATER STERILE IRR 500ML POUR (IV SOLUTION) ×1 IMPLANT

## 2022-02-26 NOTE — Anesthesia Procedure Notes (Signed)
Procedure Name: Intubation Date/Time: 02/26/2022 6:18 PM  Performed by: Aline Brochure, CRNAPre-anesthesia Checklist: Patient identified, Patient being monitored, Timeout performed, Emergency Drugs available and Suction available Patient Re-evaluated:Patient Re-evaluated prior to induction Oxygen Delivery Method: Circle system utilized Preoxygenation: Pre-oxygenation with 100% oxygen Induction Type: IV induction Ventilation: Mask ventilation without difficulty Laryngoscope Size: Mac and 3 Grade View: Grade IV Tube type: Oral Tube size: 7.0 mm Number of attempts: 1 Airway Equipment and Method: Bougie stylet Placement Confirmation: ETT inserted through vocal cords under direct vision, positive ETCO2 and breath sounds checked- equal and bilateral Secured at: 21 cm Tube secured with: Tape Dental Injury: Teeth and Oropharynx as per pre-operative assessment  Difficulty Due To: Difficulty was anticipated

## 2022-02-26 NOTE — Telephone Encounter (Signed)
Patient has been advised of Pre-Admission date/time, and Surgery date at [facility].  Surgery Date: 02/26/22, patient a work in surgery for this evening.   Preadmission Testing Date: Patient is instructed to arrive no later than 4:00 pm per same day surgery. She is also reminded to not eat or drink anything until after her surgery.

## 2022-02-26 NOTE — Op Note (Signed)
Robotic cholecystectomy with Indocyamine Green Ductal Imaging.   Pre-operative Diagnosis: Chronic calculus cholecystitis  Post-operative Diagnosis:  Same.  Procedure: Robotic assisted laparoscopic cholecystectomy with Indocyamine Green Ductal Imaging.   Surgeon: Campbell Lerner, M.D., FACS  Anesthesia: General. with endotracheal tube  Findings: No evidence of acute cholecystitis, large gallstone on extraction.  Excellent visualization of cystic duct and common duct junctions.  Estimated Blood Loss: 15 mL         Drains: None         Specimens: Gallbladder           Complications: none  Procedure Details  The patient was seen again in the Holding Room.  1.25 mg dose of ICG was administered intravenously.   The benefits, complications, treatment options, risks and expected outcomes were again reviewed with the patient. The likelihood of improving the patient's symptoms with return to their baseline status is good.  The patient and/or family concurred with the proposed plan, giving informed consent, again alternatives reviewed.  The patient was taken to Operating Room, identified, and the procedure verified as robotic assisted laparoscopic cholecystectomy.  Prior to the induction of general anesthesia, antibiotic prophylaxis was administered. VTE prophylaxis was in place. General endotracheal anesthesia was then administered and tolerated well. The patient was positioned in the supine position.  After the induction, the abdomen was prepped with Chloraprep and draped in the sterile fashion.  A Time Out was held and the above information confirmed.  Right para-umbilical local infiltration with quarter percent Marcaine with epinephrine is utilized.  Made a 12 mm incision on the right periumbilical site, I advanced an optical 63mm port under direct visualization into the peritoneal cavity.  Once the peritoneum was penetrated, insufflation was initiated.  The trocar was then advanced into the  abdominal cavity under direct visualization. Pneumoperitoneum was then continued utilizing CO2 at 15 mmHg or less and tolerated well without any adverse changes in the patient's vital signs.  Two 8.5-mm ports were placed in the left lower quadrant and laterally, and one to the right lower quadrant, all under direct vision. All skin incisions  were infiltrated with a local anesthetic agent before making the incision and placing the trocars.  The patient was positioned  in reverse Trendelenburg, tilted the patient's left side down.  Da Vinci XI robot was then positioned on to the patient's left side, and docked.  The gallbladder was identified, the fundus grasped via the arm 4 Prograsp and retracted cephalad. Adhesions were lysed with scissors and cautery.  The infundibulum was identified grasped and retracted laterally, exposing the peritoneum overlying the triangle of Calot. This was then opened and dissected using cautery & scissors. An extended critical view of the cystic duct and cystic artery was obtained, aided by the ICG via FireFly which improved localization of the ductal anatomy.    The cystic duct was clearly identified and dissected to isolation.   Artery well isolated and clipped, and the cystic duct was triple clipped and divided with scissors, as close to the gallbladder neck as feasible, thus leaving two on the remaining stump.  The specimen side of the artery is sealed with bipolar and divided with monopolar scissors.   The gallbladder was taken from the gallbladder fossa in a retrograde fashion with the electrocautery. The gallbladder was removed and placed in an Endocatch bag.  The liver bed is inspected. Hemostasis was confirmed.  The robot was undocked and moved away from the operative field. No irrigation was utilized. The gallbladder  and Endocatch sac were then removed through the infraumbilical port site.   Inspection of the right upper quadrant was performed. No bleeding, bile  duct injury or leak, or bowel injury was noted. The infra-umbilical port site fascia was closed with interrumpted 0 Vicryl sutures using PMI/cone under direct visualization. Pneumoperitoneum was released and ports removed.  4-0 subcuticular Monocryl was used to close the skin. Dermabond was  applied.  The patient was then extubated and brought to the recovery room in stable condition. Sponge, lap, and needle counts were correct at closure and at the conclusion of the case.               Ronny Bacon, M.D., [facility] 02/26/2022 7:37 PM

## 2022-02-26 NOTE — Patient Instructions (Addendum)
Our surgery scheduler will call you later today to let you know your arrival time.   Do not eat/drink anything. Gallbladder Eating Plan High blood cholesterol, obesity, a sedentary lifestyle, an unhealthy diet, and diabetes are risk factors for developing gallstones. If you have a gallbladder condition, you may have trouble digesting fats and tolerating high fat intake. Eating a low-fat diet can help reduce your symptoms and may be helpful before and after having surgery to remove your gallbladder (cholecystectomy). Your health care provider may recommend that you work with a dietitian to help you reduce the amount of fat in your diet. What are tips for following this plan? General guidelines Limit your fat intake to less than 30% of your total daily calories. If you eat around 1,800 calories each day, this means eating less than 60 grams (g) of fat per day. Fat is an important part of a healthy diet. Eating a low-fat diet can make it hard to maintain a healthy body weight. Ask your dietitian how much fat, calories, and other nutrients you need each day. Eat small, frequent meals throughout the day instead of three large meals. Drink at least 8-10 cups (1.9-2.4 L) of fluid a day. Drink enough fluid to keep your urine pale yellow. If you drink alcohol: Limit how much you have to: 0-1 drink a day for women who are not pregnant. 0-2 drinks a day for men. Know how much alcohol is in a drink. In the [place] one drink equals one 12 oz bottle of beer (355 mL), one 5 oz glass of wine (148 mL), or one 1 oz glass of hard liquor (44 mL). Reading food labels  Check nutrition facts on food labels for the amount of fat per serving. Choose foods with less than 3 grams of fat per serving. Shopping Choose nonfat and low-fat healthy foods. Look for the words "nonfat," "low-fat," or "fat-free." Avoid buying processed or prepackaged foods. Cooking Cook using low-fat methods, such as baking, broiling, grilling, or  boiling. Cook with small amounts of healthy fats, such as olive oil, grapeseed oil, canola oil, avocado oil, or sunflower oil. What foods are recommended?  All fresh, frozen, or canned fruits and vegetables. Whole grains. Low-fat or nonfat (skim) milk and yogurt. Lean meat, skinless poultry, fish, eggs, and beans. Low-fat protein supplement powders or drinks. Spices and herbs. The items listed above may not be a complete list of foods and beverages you can eat and drink. Contact a dietitian for more information. What foods are not recommended? High-fat foods. These include baked goods, fast food, fatty cuts of meat, ice cream, french toast, sweet rolls, pizza, cheese bread, foods covered with butter, creamy sauces, or cheese. Fried foods. These include french fries, tempura, battered fish, breaded chicken, fried breads, and sweets. Foods that cause bloating and gas. The items listed above may not be a complete list of foods that you should avoid. Contact a dietitian for more information. Summary A low-fat diet can be helpful if you have a gallbladder condition, or before and after gallbladder surgery. Limit your fat intake to less than 30% of your total daily calories. This is about 60 g of fat if you eat 1,800 calories each day. Eat small, frequent meals throughout the day instead of three large meals. This information is not intended to replace advice given to you by your health care provider. Make sure you discuss any questions you have with your health care provider. Document Revised: 02/07/2021 Document Reviewed: 02/07/2021 Elsevier Patient  Education  2023 [employer]     Minimally Invasive Cholecystectomy Minimally invasive cholecystectomy is surgery to remove the gallbladder. The gallbladder is a pear-shaped organ that lies beneath the liver on the right side of the body. The gallbladder stores bile, which is a fluid that helps the body digest fats. Cholecystectomy is often done  to treat inflammation (irritation and swelling) of the gallbladder (cholecystitis). This condition is usually caused by a buildup of gallstones (cholelithiasis) in the gallbladder or when the fluid in the gall bladder becomes stagnant because gallstones get stuck in the ducts (tubes) and block the flow of bile. This can result in inflammation and pain. In severe cases, emergency surgery may be required. This procedure is done through small incisions in the abdomen, instead of one large incision. It is also called laparoscopic surgery. A thin scope with a camera (laparoscope) is inserted through one incision. Then surgical instruments are inserted through the other incisions. In some cases, a minimally invasive surgery may need to be changed to a surgery that is done through a larger incision. This is called open surgery. Tell a health care provider about: Any allergies you have. All medicines you are taking, including vitamins, herbs, eye drops, creams, and over-the-counter medicines. Any problems you or family members have had with anesthetic medicines. Any bleeding problems you have. Any surgeries you have had. Any medical conditions you have. Whether you are pregnant or may be pregnant. What are the risks? Generally, this is a safe procedure. However, problems may occur, including: Infection. Bleeding. Allergic reactions to medicines. Damage to nearby structures or organs. A gallstone remaining in the common bile duct. The common bile duct carries bile from the gallbladder to the small intestine. A bile leak from the liver or cystic duct after your gallbladder is removed. What happens before the procedure? When to stop eating and drinking Follow instructions from your health care provider about what you may eat and drink before your procedure. These may include: 8 hours before the procedure Stop eating most foods. Do not eat meat, fried foods, or fatty foods. Eat only light foods, such as  toast or crackers. All liquids are okay except energy drinks and alcohol. 6 hours before the procedure Stop eating. Drink only clear liquids, such as water, clear fruit juice, black coffee, plain tea, and sports drinks. Do not drink energy drinks or alcohol. 2 hours before the procedure Stop drinking all liquids. You may be allowed to take medicines with small sips of water. If you do not follow your health care provider's instructions, your procedure may be delayed or canceled. Medicines Ask your health care provider about: Changing or stopping your regular medicines. This is especially important if you are taking diabetes medicines or blood thinners. Taking medicines such as aspirin and ibuprofen. These medicines can thin your blood. Do not take these medicines unless your health care provider tells you to take them. Taking over-the-counter medicines, vitamins, herbs, and supplements. General instructions If you will be going home right after the procedure, plan to have a responsible adult: Take you home from the hospital or clinic. You will not be allowed to drive. Care for you for the time you are told. Do not use any products that contain nicotine or tobacco for at least 4 weeks before the procedure. These products include cigarettes, chewing tobacco, and vaping devices, such as e-cigarettes. If you need help quitting, ask your health care provider. Ask your health care provider: How your surgery site will  be marked. What steps will be taken to help prevent infection. These may include: Removing hair at the surgery site. Washing skin with a germ-killing soap. Taking antibiotic medicine. What happens during the procedure?  An IV will be inserted into one of your veins. You will be given one or both of the following: A medicine to help you relax (sedative). A medicine to make you fall asleep (general anesthetic). Your surgeon will make several small incisions in your abdomen. The  laparoscope will be inserted through one of the small incisions. The camera on the laparoscope will send images to a monitor in the operating room. This lets your surgeon see inside your abdomen. A gas will be pumped into your abdomen. This will expand your abdomen to give the surgeon more room to perform the surgery. Other tools that are needed for the procedure will be inserted through the other incisions. The gallbladder will be removed through one of the incisions. Your common bile duct may be examined. If stones are found in the common bile duct, they may be removed. After your gallbladder has been removed, the incisions will be closed with stitches (sutures), staples, or skin glue. Your incisions will be covered with a bandage (dressing). The procedure may vary among health care providers and hospitals. What happens after the procedure? Your blood pressure, heart rate, breathing rate, and blood oxygen level will be monitored until you leave the hospital or clinic. You will be given medicines as needed to control your pain. You may have a drain placed in the incision. The drain will be removed a day or two after the procedure. Summary Minimally invasive cholecystectomy, also called laparoscopic cholecystectomy, is surgery to remove the gallbladder using small incisions. Tell your health care provider about all the medical conditions you have and all the medicines you are taking for those conditions. Before the procedure, follow instructions about when to stop eating and drinking and changing or stopping medicines. Plan to have a responsible adult care for you for the time you are told after you leave the hospital or clinic. This information is not intended to replace advice given to you by your health care provider. Make sure you discuss any questions you have with your health care provider. Document Revised: 08/27/2020 Document Reviewed: 08/27/2020 Elsevier Patient Education  2023 Else[employer]  Inc[employer] Cholelithiasis  Cholelithiasis happens when gallstones form in the gallbladder. The gallbladder stores bile. Bile is a fluid that helps digest fats. Bile can harden and form into gallstones. If they cause a blockage, they can cause pain (gallbladder attack). What are the causes? This condition may be caused by: Some blood diseases, such as sickle cell anemia. Too much of a fat-like substance (cholesterol) in your bile. Not enough bile salts in your bile. These salts help the body absorb and digest fats. The gallbladder not emptying fully or often enough. This is common in pregnant women. What increases the risk? The following factors may make you more likely to develop this condition: Being female. Being pregnant many times. Eating a lot of fried foods, fat, and refined carbs (refined carbohydrates). Being very overweight (obese). Being older than age 19. Using medicines with female hormones in them for a long time. Losing weight fast. Having gallstones in your family. Having some health problems, such as diabetes, Crohn's disease, or liver disease. What are the signs or symptoms? Often, there may be gallstones but no symptoms. These gallstones are called silent gallstones. If a gallstone causes a blockage, you may  get sudden pain. The pain: Can be in the upper right part of your belly (abdomen). Normally comes at night or after you eat. Can last an hour or more. Can spread to your right shoulder, back, or chest. Can feel like discomfort, burning, or fullness in the upper part of your belly (indigestion). If the blockage lasts more than a few hours, you can get an infection or swelling. You may: Feel like you may vomit. Vomit. Feel bloated. Have belly pain for 5 hours or more. Feel tender in your belly, often in the upper right part and under your ribs. Have fever or chills. Have skin or the white parts of your eyes turn yellow (jaundice). Have dark pee (urine) or pale poop  (stool). How is this treated? Treatment for this condition depends on how bad you feel. If you have symptoms, you may need: Home care, if symptoms are not very bad. Do not eat for 12-24 hours. Drink only water and clear liquids. Start to eat simple or clear foods after 1 or 2 days. Try broths and crackers. You may need medicines for pain or stomach upset or both. If you have an infection, you will need antibiotics. A hospital stay, if you have very bad pain or a very bad infection. Surgery to remove your gallbladder. You may need this if: Gallstones keep coming back. You have very bad symptoms. Medicines to break up gallstones. Medicines: Are best for small gallstones. May be used for up to 6-12 months. A procedure to find and take out gallstones or to break up gallstones. Follow these instructions at home: Medicines Take over-the-counter and prescription medicines only as told by your doctor. If you were prescribed an antibiotic medicine, take it as told by your doctor. Do not stop taking the antibiotic even if you start to feel better. Ask your doctor if the medicine prescribed to you requires you to avoid driving or using machinery. Eating and drinking Drink enough fluid to keep your urine pale yellow. Drink water or clear fluids. This is important when you have pain. Eat healthy foods. Choose: Fewer fatty foods, such as fried foods. Fewer refined carbs. Avoid breads and grains that are highly processed, such as white bread and white rice. Choose whole grains, such as whole-wheat bread and brown rice. More fiber. Almonds, fresh fruit, and beans are healthy sources. General instructions Keep a healthy weight. Keep all follow-up visits as told by your doctor. This is important. Where to find more information [employer] of Diabetes and Digestive and Kidney Diseases: [employer] Contact a doctor if: You have sudden pain in the upper right part of your belly. Pain might  spread to your right shoulder, back, or chest. You have been diagnosed with gallstones that have no symptoms and you get: Belly pain. Discomfort, burning, or fullness in the upper part of your abdomen. You have dark urine or pale stools. Get help right away if: You have sudden pain in the upper right part of your abdomen, and the pain lasts more than 2 hours. You have pain in your abdomen, and: It lasts more than 5 hours. It keeps getting worse. You have a fever or chills. You keep feeling like you may vomit. You keep vomiting. Your skin or the white parts of your eyes turn yellow. Summary Cholelithiasis happens when gallstones form in the gallbladder. This condition may be caused by a blood disease, too much of a fat-like substance in the bile, or not enough bile salts in bile.  Treatment for this condition depends on how bad you feel. If you have symptoms, do not eat or drink. You may need medicines. You may need a hospital stay for very bad pain or a very bad infection. You may need surgery if gallstones keep coming back or if you have very bad symptoms. This information is not intended to replace advice given to you by your health care provider. Make sure you discuss any questions you have with your health care provider. Document Revised: 04/14/2019 Document Reviewed: 01/16/2019 Elsevier Patient Education  2023 [employer].

## 2022-02-26 NOTE — Interval H&P Note (Signed)
History and Physical Interval Note:  02/26/2022 5:56 PM  Laurie Schaefer  has presented today for surgery, with the diagnosis of chronic calculous cholecystitis.  The various methods of treatment have been discussed with the patient and family. After consideration of risks, benefits and other options for treatment, the patient has consented to  Procedure(s): XI ROBOTIC ASSISTED LAPAROSCOPIC CHOLECYSTECTOMY (N/A) INDOCYANINE GREEN FLUORESCENCE IMAGING (ICG) (N/A) as a surgical intervention.  The patient's history has been reviewed, patient examined, no change in status, stable for surgery.  I have reviewed the patient's chart and labs.  Questions were answered to the patient's satisfaction.     Campbell Lerner

## 2022-02-26 NOTE — Transfer of Care (Signed)
Immediate Anesthesia Transfer of Care Note  Patient: Laurie Schaefer  Procedure(s) Performed: XI ROBOTIC ASSISTED LAPAROSCOPIC CHOLECYSTECTOMY INDOCYANINE GREEN FLUORESCENCE IMAGING (ICG)  Patient Location: PACU  Anesthesia Type:General  Level of Consciousness: awake  Airway & Oxygen Therapy: Patient Spontanous Breathing and Patient connected to face mask oxygen  Post-op Assessment: Report given to RN and Post -op Vital signs reviewed and stable  Post vital signs: Reviewed and stable  Last Vitals:  Vitals Value Taken Time  BP 128/56   Temp 96.7f   Pulse 93 02/26/22 1935  Resp 16 02/26/22 1935  SpO2 99 % 02/26/22 1935  Vitals shown include unvalidated device data.  Last Pain:  Vitals:   02/26/22 1611  TempSrc: Temporal  PainSc: 8          Complications:  Encounter Notable Events  Notable Event Outcome Phase Comment  Difficult to intubate - expected  Intraprocedure Filed from anesthesia note documentation.

## 2022-02-26 NOTE — Anesthesia Preprocedure Evaluation (Signed)
Anesthesia Evaluation  Patient identified by MRN, date of birth, ID band Patient awake    Reviewed: Allergy & Precautions, H&P , NPO status , Patient's Chart, lab work & pertinent test results, reviewed documented beta blocker date and time   Airway Mallampati: III  TM Distance: >3 FB Neck ROM: full    Dental  (+) Teeth Intact   Pulmonary shortness of breath, with exertion, at rest and Long-Term Oxygen Therapy, asthma , neg sleep apnea, COPD,  COPD inhaler and oxygen dependent   Pulmonary exam normal        Cardiovascular Exercise Tolerance: Poor hypertension, On Medications (-) angina + DOE and + DVT  Normal cardiovascular exam Rhythm:regular Rate:Normal  Recently seen by Cards 3 weeks ago and ok for above. ja   Neuro/Psych  Headaches PSYCHIATRIC DISORDERS  Depression Bipolar Disorder    Neuromuscular disease    GI/Hepatic negative GI ROS, Neg liver ROS,,,  Endo/Other  negative endocrine ROS    Renal/GU negative Renal ROS  negative genitourinary   Musculoskeletal   Abdominal   Peds  Hematology negative hematology ROS (+)   Anesthesia Other Findings Past Medical History: No date: Arthritis No date: Bipolar disorder (HCC) No date: Chronic neck pain     Comment:  had previously seen Dr. Salli Real at [facility]  No date: COPD (chronic obstructive pulmonary disease) [facility])     Comment:  sees Dr. Delton Coombes  No date: Depression No date: Fibromyalgia No date: Headache No date: Hypertension Past Surgical History: 08/29/2021: COLONOSCOPY     Comment:  per Dr. Carney Harder in [facility] [place] No date: FRACTURE SURGERY BMI    Body Mass Index: 33.46 kg/m     Reproductive/Obstetrics negative OB ROS                             Anesthesia Physical Anesthesia Plan  ASA: 3 and emergent  Anesthesia Plan: General ETT   Post-op Pain Management:    Induction:   PONV Risk  Score and Plan: 4 or greater  Airway Management Planned:   Additional Equipment:   Intra-op Plan:   Post-operative Plan:   Informed Consent: I have reviewed the patients History and Physical, chart, labs and discussed the procedure including the risks, benefits and alternatives for the proposed anesthesia with the patient or authorized representative who has indicated his/her understanding and acceptance.     Dental Advisory Given  Plan Discussed with: CRNA  Anesthesia Plan Comments:        Anesthesia Quick Evaluation

## 2022-02-27 DIAGNOSIS — Z79899 Other long term (current) drug therapy: Secondary | ICD-10-CM | POA: Diagnosis not present

## 2022-02-27 DIAGNOSIS — I10 Essential (primary) hypertension: Secondary | ICD-10-CM | POA: Diagnosis not present

## 2022-02-27 DIAGNOSIS — J449 Chronic obstructive pulmonary disease, unspecified: Secondary | ICD-10-CM | POA: Diagnosis not present

## 2022-02-27 DIAGNOSIS — Z7901 Long term (current) use of anticoagulants: Secondary | ICD-10-CM | POA: Diagnosis not present

## 2022-02-27 DIAGNOSIS — K8012 Calculus of gallbladder with acute and chronic cholecystitis without obstruction: Secondary | ICD-10-CM | POA: Diagnosis not present

## 2022-02-27 DIAGNOSIS — Z91040 Latex allergy status: Secondary | ICD-10-CM | POA: Diagnosis not present

## 2022-02-27 MED ORDER — IBUPROFEN 800 MG PO TABS: 800.0000 mg | ORAL_TABLET | Freq: Three times a day (TID) | ORAL | 0 refills | Status: DC | PRN

## 2022-02-27 MED ORDER — OXYCODONE HCL 5 MG PO TABS: 5.0000 mg | ORAL_TABLET | Freq: Four times a day (QID) | ORAL | 0 refills | Status: DC | PRN

## 2022-02-27 NOTE — Discharge Instructions (Signed)
In addition to included general post-operative instructions, ° °Diet: Resume home diet. Recommend avoiding or limiting fatty/greasy foods over the next few days/week. If you do eat these, you may (or may not) notice diarrhea. This is expected while your body adjusts to not having a gallbladder, and it typically resolves with time.   ° °Activity: No heavy lifting >20 pounds (children, pets, laundry, garbage) or strenuous activity for 4 weeks, but light activity and walking are encouraged. Do not drive or drink alcohol if taking narcotic pain medications or having pain that might distract from driving. ° °Wound care: 2 days after surgery (12/23), you may shower/get incision wet with soapy water and pat dry (do not rub incisions), but no baths or submerging incision underwater until follow-up.  ° °Medications: Resume all home medications. For mild to moderate pain: acetaminophen (Tylenol) or ibuprofen/naproxen (if no kidney disease). Combining Tylenol with alcohol can substantially increase your risk of causing liver disease. Narcotic pain medications, if prescribed, can be used for severe pain, though may cause nausea, constipation, and drowsiness. Do not combine Tylenol and Percocet (or similar) within a 6 hour period as Percocet (and similar) contain(s) Tylenol. If you do not need the narcotic pain medication, you do not need to fill the prescription. ° °Call office ([phone] / [phone]) at any time if any questions, worsening pain, fevers/chills, bleeding, drainage from incision site, or other concerns. ° °

## 2022-02-27 NOTE — Progress Notes (Signed)
.  Patient discharged home with family.  Discharge instructions, when to follow up, and prescriptions reviewed with patient. Pt refused Lyrica and was educated. Patient verbalized understanding. Patient will be escorted out by auxiliary.

## 2022-02-27 NOTE — Discharge Summary (Signed)
[facility] SURGICAL DISCHARGE SUMMARY  Patient ID: Laurie Schaefer MRN: [id number] DOB/AGE: [birth year] 66 y.o.  Admit date: 02/26/2022 Discharge date: 02/27/2022  Discharge Diagnoses Patient Active Problem List   Diagnosis Date Noted   CCC (chronic calculous cholecystitis) 02/26/2022   S/P laparoscopic cholecystectomy 02/26/2022   Gall stones 01/20/2022    Consultants None  Procedures 02/26/2022:  Robotic assisted laparoscopic cholecystectomy  HPI: Laurie Schaefer is a 66 y.o. female with a 20-month history of postprandial epigastric pain which radiates to the back.  Associated nausea and vomiting.  She reports being up all night having pain that was not resolving.  At 4 AM she forced herself to eat a ham sandwich which only exacerbated her pain.  She has not had anything to eat since, but had a small sip of Gatorade while in the office.  She reports her last dose of Eliquis was last night at 10 PM.  She has a history of pulmonary embolism for which she takes the blood thinner.  She denies any history of jaundice, melena, hematochezia, hematemesis.  She denies any biliary emesis, and may have once had a light-colored stool.  She otherwise has had no fevers or chills. She was directed admitted for laparoscopic cholecystectomy.   Hospital Course: Informed consent was obtained and documented, and patient underwent uneventful lrobotic assisted laparoscopic cholecystectomy (Dr Claudine Mouton, 02/26/2022).  Post-operatively, patient's pain/symptoms improved/resolved and advancement of patient's diet and ambulation were well-tolerated. The remainder of patient's hospital course was essentially unremarkable, and discharge planning was initiated accordingly with patient safely able to be discharged home with appropriate discharge instructions, pain control, and outpatient follow-up after all of her questions were answered to her expressed satisfaction.   Discharge Condition:  Good   Physical Examination:  Constitutional: Well appearing female, NAD Pulmonary: Normal effort, no respiratory distress Gastrointestinal: Soft, incisional soreness, non-distended, no rebound/guarding Skin: Laparoscopic incisions are CDI with dermabond, no erythema or drainage. Early ecchymosis at camera port site   Allergies as of 02/27/2022       Reactions   Ciprofloxacin Other (See Comments)   Per patient makes her "wacko"    Codeine Nausea Only   Latex Itching        Medication List     TAKE these medications    albuterol 108 (90 Base) MCG/ACT inhaler Commonly known as: VENTOLIN HFA INHALE 2 PUFFS INTO THE LUNGS EVERY 6 HOURS AS NEEDED FOR WHEEZING OR SHORTNESS OF BREATH   ARIPiprazole 20 MG tablet Commonly known as: Abilify Take 1 tablet (20 mg total) by mouth daily.   atorvastatin 10 MG tablet Commonly known as: LIPITOR Take 1 tablet (10 mg total) by mouth daily.   budesonide-formoterol 160-4.5 MCG/ACT inhaler Commonly known as: Symbicort Inhale 2 puffs into the lungs in the morning and at bedtime.   Eliquis 5 MG Tabs tablet Generic drug: apixaban TAKE 1 TABLET BY MOUTH TWICE A DAY   ibuprofen 800 MG tablet Commonly known as: ADVIL Take 1 tablet (800 mg total) by mouth every 8 (eight) hours as needed.   LORazepam 2 MG tablet Commonly known as: ATIVAN TAKE 1 TABLET(2 MG) BY MOUTH EVERY 6 HOURS AS NEEDED FOR ANXIETY   losartan-hydrochlorothiazide 100-25 MG tablet Commonly known as: HYZAAR Take 0.5 tablets by mouth daily.   methocarbamol 500 MG tablet Commonly known as: ROBAXIN Take 1 tablet (500 mg total) by mouth every 8 (eight) hours as needed for muscle spasms.   metoprolol tartrate 100 MG tablet Commonly known as:  LOPRESSOR Take 1 tablet by mouth once for procedure.   ondansetron 8 MG tablet Commonly known as: Zofran Take 1 tablet (8 mg total) by mouth every 6 (six) hours as needed for nausea or vomiting.   oxyCODONE 5 MG immediate  release tablet Commonly known as: Oxy IR/ROXICODONE Take 1 tablet (5 mg total) by mouth every 6 (six) hours as needed for severe pain.   OXYGEN Place 3 L into the nose continuous.   pantoprazole 40 MG tablet Commonly known as: PROTONIX Take 1 tablet (40 mg total) by mouth daily.   polyethylene glycol powder 17 GM/SCOOP powder Commonly known as: GLYCOLAX/MIRALAX Take 17 g by mouth 2 (two) times daily as needed.   potassium chloride 10 MEQ tablet Commonly known as: KLOR-CON Take 1 tablet (10 mEq total) by mouth daily.   prazosin 2 MG capsule Commonly known as: MINIPRESS Take 1 capsule (2 mg total) by mouth at bedtime. What changed:  when to take this reasons to take this   pregabalin 200 MG capsule Commonly known as: Lyrica Take 1 capsule (200 mg total) by mouth at bedtime.   pregabalin 50 MG capsule Commonly known as: Lyrica Take one capsule in the morning and one in the afternoon   Psyllium 25 % Powd Take 1-2 teaspoons once daily with first meal of the day.   Spiriva Respimat 2.5 MCG/ACT Aers Generic drug: Tiotropium Bromide Monohydrate Inhale 2 puffs into the lungs daily.   SUMAtriptan 100 MG tablet Commonly known as: IMITREX TAKE 1 TABLET BY MOUTH AS NEEDED FOR MIGRAINE. MAY REPEAT IN 2 HOURS IF HEADACHE PERSISTS OR RECURS.   zolpidem 10 MG tablet Commonly known as: AMBIEN TAKE 1 TABLET BY MOUTH AT BEDTIME AS NEEDED FOR SLEEP.               Discharge Care Instructions  (From admission, onward)           Start     Ordered   02/27/22 0000  Discharge wound care:       Comments: Your incision was closed with Dermabond.  It is best to keep it clean and dry, it will tolerate a brief shower, but do not soak it or apply any creams or lotions to the incisions.  The Dermabond should gradually flake off over time.  Keep it open to air so you can evaluate your incisions.  Dermabond assists the underlying sutures to keep your incision closed and protected from  infection.  Should you develop some drainage from your incision, some drops of drainage would be okay but if it persists continue to put keep a dry dressing over it.   02/27/22 0604              Follow-up Information     Tylene Fantasia, PA-C. Schedule an appointment as soon as possible for a visit in 3 week(s).   Specialty: Physician Assistant Why: s/p lap chole Contact information: [address] [place] [address] [phone]                  Time spent on discharge management including discussion of hospital course, clinical condition, outpatient instructions, prescriptions, and follow up with the patient and members of the medical team: >30 minutes  -- Edison Simon , PA-C  [facility]  02/27/2022, 9:10 AM [phone] M-F: 7am - 4pm

## 2022-03-03 LAB — SURGICAL PATHOLOGY

## 2022-03-06 NOTE — Anesthesia Postprocedure Evaluation (Signed)
Anesthesia Post Note  Patient: Laurie Schaefer  Procedure(s) Performed: XI ROBOTIC ASSISTED LAPAROSCOPIC CHOLECYSTECTOMY INDOCYANINE GREEN FLUORESCENCE IMAGING (ICG)  Patient location during evaluation: PACU Anesthesia Type: General Level of consciousness: awake and alert Pain management: pain level controlled Vital Signs Assessment: post-procedure vital signs reviewed and stable Respiratory status: spontaneous breathing, nonlabored ventilation, respiratory function stable and patient connected to nasal cannula oxygen Cardiovascular status: blood pressure returned to baseline and stable Postop Assessment: no apparent nausea or vomiting Anesthetic complications: yes   Encounter Notable Events  Notable Event Outcome Phase Comment  Difficult to intubate - expected  Intraprocedure Filed from anesthesia note documentation.     Last Vitals:  Vitals:   02/27/22 0240 02/27/22 0808  BP: 126/78 (!) 150/69  Pulse: 91 87  Resp:  18  Temp:  36.7 C  SpO2:  97%    Last Pain:  Vitals:   02/27/22 0836  TempSrc:   PainSc: 6                  Yevette Edwards

## 2022-03-11 ENCOUNTER — Other Ambulatory Visit: Payer: Self-pay

## 2022-03-11 DIAGNOSIS — R072 Precordial pain: Secondary | ICD-10-CM

## 2022-03-12 ENCOUNTER — Telehealth: Payer: Self-pay

## 2022-03-12 NOTE — Telephone Encounter (Signed)
Pt Placard was placed at the front office cabinet for pt to pick up, pt is aware

## 2022-03-19 ENCOUNTER — Encounter: Payer: Medicare HMO | Admitting: Physician Assistant

## 2022-03-19 ENCOUNTER — Telehealth: Payer: Self-pay | Admitting: Acute Care

## 2022-03-19 NOTE — Telephone Encounter (Signed)
Fax received from Dr. Berniece Salines with [facility] to perform a root canal, fillings, extractions and crown prep on patient.  Patient needs surgery clearance. Surgery is 04/23/2022. Patient was seen on 01/02/2022. Office protocol is a risk assessment can be sent to surgeon if patient has been seen in 60 days or less.   Sending to Eric Form NP for risk assessment or recommendations if patient needs to be seen in office prior to surgical procedure.    Anesthesia being used: Deep sedation or moderate  Where: in clinic   Length: 4 plus hours  Patient is also on Eliquis 5mg . Do you have recommendations for the anticoagulation or do you want the primary care to take care of this.   Patient has office visit with Judson Roch 04/10/2022.

## 2022-03-23 ENCOUNTER — Encounter: Payer: Self-pay | Admitting: Family Medicine

## 2022-03-23 MED ORDER — OXYCODONE HCL 30 MG PO TABS: 30.0000 mg | ORAL_TABLET | ORAL | 0 refills | Status: DC | PRN

## 2022-03-23 NOTE — Telephone Encounter (Signed)
Yes she is scheduled for 1:00 this Wednesday. I sent in a 5 day supply

## 2022-03-25 ENCOUNTER — Telehealth: Payer: Self-pay | Admitting: Family Medicine

## 2022-03-25 ENCOUNTER — Encounter: Payer: Self-pay | Admitting: Family Medicine

## 2022-03-25 ENCOUNTER — Other Ambulatory Visit: Payer: Self-pay | Admitting: Family Medicine

## 2022-03-25 ENCOUNTER — Telehealth: Payer: Medicare HMO | Admitting: Family Medicine

## 2022-03-25 DIAGNOSIS — J439 Emphysema, unspecified: Secondary | ICD-10-CM

## 2022-03-25 NOTE — Telephone Encounter (Signed)
Aaron Edelman with [facility] [phone] - Members services  Pt is very upset  Needing PA ASAP for the following: Poc Nebulizer  (Through [place])  Please call it in ASAP:  [phone]  Ask to Expedite

## 2022-03-25 NOTE — Telephone Encounter (Signed)
Patient is requesting an order for  portable oxygen concentrator and a nebulizer to be sent to [facility] fax# [phone]. Patient told [employer] she has been reaching out to provider for this and has not gotten an answer.

## 2022-03-25 NOTE — Telephone Encounter (Signed)
This will need to come from Eric Form NP in Pulmonology

## 2022-03-25 NOTE — Progress Notes (Unsigned)
   Subjective:    Patient ID: Laurie Schaefer, female    DOB: 04-07-1955, 67 y.o.   MRN: [id number]  HPI    Review of Systems     Objective:   Physical Exam        Assessment & Plan:

## 2022-03-26 ENCOUNTER — Telehealth: Payer: Medicare HMO | Admitting: Family Medicine

## 2022-03-26 NOTE — Telephone Encounter (Addendum)
Spoke with  [facility] [phone]    information for [facility] given with phone number [phone].

## 2022-03-27 ENCOUNTER — Encounter: Payer: Self-pay | Admitting: Family Medicine

## 2022-03-27 ENCOUNTER — Telehealth: Payer: Medicare HMO | Admitting: Family Medicine

## 2022-03-27 ENCOUNTER — Other Ambulatory Visit (HOSPITAL_COMMUNITY): Payer: Self-pay

## 2022-03-27 ENCOUNTER — Ambulatory Visit (INDEPENDENT_AMBULATORY_CARE_PROVIDER_SITE_OTHER): Payer: Medicare HMO | Admitting: Family Medicine

## 2022-03-27 VITALS — BP 138/100 | HR 100 | Temp 97.6°F | Wt 205.0 lb

## 2022-03-27 DIAGNOSIS — M797 Fibromyalgia: Secondary | ICD-10-CM

## 2022-03-27 DIAGNOSIS — F1190 Opioid use, unspecified, uncomplicated: Secondary | ICD-10-CM

## 2022-03-27 MED ORDER — OXYCODONE HCL 30 MG PO TABS: 30.0000 mg | ORAL_TABLET | ORAL | 0 refills | Status: AC | PRN

## 2022-03-27 MED ORDER — OXYCODONE HCL 30 MG PO TABS: 30.0000 mg | ORAL_TABLET | ORAL | 0 refills | Status: DC | PRN

## 2022-03-27 NOTE — Telephone Encounter (Signed)
As I said 2 days ago, this needs to be done through [place]

## 2022-03-27 NOTE — Progress Notes (Signed)
   Subjective:    Patient ID: Laurie Schaefer, female    DOB: August 08, 1955, 67 y.o.   MRN: [id number]  HPI Here for pain management. Her fibromyalgia is stable. She is recovering from a recent emergency cholecystectomy, and her abdominal pain is quickly disappearing.    Review of Systems  Constitutional: Negative.   Musculoskeletal:  Positive for myalgias.       Objective:   Physical Exam Constitutional:      Comments: Walks slowly with a cane   Neurological:     Mental Status: She is alert.           Assessment & Plan:  Pain management. Indication for chronic opioid: fibromyalgia Medication and dose: Oxycodone 30 mg  # pills per month: 180 Last UDS date: 07-29-21 Opioid Treatment Agreement signed (Y/N): 01-15-20 Opioid Treatment Agreement last reviewed with patient:  03-27-22 N[facility]SRS reviewed this encounter (include red flags): Yes Meds were refilled.  Alysia Penna, MD

## 2022-03-27 NOTE — Telephone Encounter (Signed)
Dr. Sarajane Jews should this requested be forwarded to  Eric Form NP in Pulmonology

## 2022-03-27 NOTE — Telephone Encounter (Signed)
Placed a call to [facility] to follow up about prior auth needed for the DME items (POC and neb machine).   Per representative, the doctor will need to call the orders to [phone] [facility]) to be filled and documentation will need to be sent into [facility].

## 2022-03-27 NOTE — Telephone Encounter (Signed)
After review of pt's chart POC order was placed on 01/02/22 and nebulizer order was placed on 01/23/22. Once all [employer] pt had to switch to [employer] was never filled for some reason. [employer] was called by RN but at the [facility] which handled to [facility] pt no one was available to talk to me. New orders were placed at [facility] request. [facility] pt and give update but phone number documented was a [employer] stating a different name. [employer] should be calling back with answer to what happened to original orders. Will leave encounter open for documentation.   Routing to [employer] as Juluis Rainier

## 2022-03-27 NOTE — Telephone Encounter (Signed)
Pt needs the following.Please advise with this per Dr Sarajane Jews

## 2022-03-29 ENCOUNTER — Other Ambulatory Visit: Payer: Self-pay | Admitting: Family Medicine

## 2022-03-30 NOTE — Telephone Encounter (Signed)
Last OV PMV-03/27/22 Last refill-09/23/21-30 tabs, 5 refills  No future OV scheduled.

## 2022-03-31 ENCOUNTER — Telehealth (HOSPITAL_COMMUNITY): Payer: Self-pay | Admitting: Emergency Medicine

## 2022-03-31 NOTE — Telephone Encounter (Signed)
Attempted to call patient regarding upcoming cardiac CT appointment. Left message on voicemail with name and callback number Marchia Bond RN Navigator Cardiac Section [facility] [phone] Office [phone] Cell

## 2022-04-01 ENCOUNTER — Telehealth (HOSPITAL_COMMUNITY): Payer: Self-pay | Admitting: *Deleted

## 2022-04-01 NOTE — Telephone Encounter (Signed)
Patient returning call regarding upcoming cardiac imaging study; pt verbalizes understanding of appt date/time, parking situation and where to check in, pre-test NPO status and medications ordered, and verified current allergies; name and call back number provided for further questions should they arise  Gordy Clement RN Navigator Cardiac Imaging [facility] [phone] office [phone] cell  Patient to take '100mg'$  metoprolol tartrate two hours prior to her cardiac CT scan.

## 2022-04-02 ENCOUNTER — Ambulatory Visit: Admission: RE | Admit: 2022-04-02 | Payer: Medicare HMO | Source: Ambulatory Visit

## 2022-04-02 ENCOUNTER — Telehealth (HOSPITAL_COMMUNITY): Payer: Self-pay | Admitting: *Deleted

## 2022-04-02 ENCOUNTER — Telehealth: Payer: Self-pay | Admitting: Family Medicine

## 2022-04-02 ENCOUNTER — Telehealth: Payer: Self-pay | Admitting: Acute Care

## 2022-04-02 ENCOUNTER — Other Ambulatory Visit (HOSPITAL_COMMUNITY): Payer: Self-pay

## 2022-04-02 ENCOUNTER — Encounter: Payer: Medicare HMO | Admitting: Physician Assistant

## 2022-04-02 NOTE — Telephone Encounter (Signed)
Missing the clinical face to face notes that state the need for the nebulizer. Can be faxed to [phone]

## 2022-04-02 NOTE — Telephone Encounter (Signed)
Patient calling to say she is having breathing problems today and would like to reschedule her CCTA. New appointment made for April 16, 2022 at 2pm.  Patient verbalized understanding of new time.  Gordy Clement RN Navigator Cardiac Imaging [facility] [phone] Office [phone] Cell

## 2022-04-02 NOTE — Telephone Encounter (Signed)
Patient called because the pharmacy stated that she needs a PA for her oxycodone (ROXICODONE) 30 MG immediate release tablet OXYGEN   I did advise patient that Dr.Fry has left for today and will be out tomorrow but the message will still be sent back.       Please advise

## 2022-04-02 NOTE — Telephone Encounter (Signed)
Spoke with an agent advised that pt pulmonology is [facility], advised to request records from their office, verbalized understanding

## 2022-04-02 NOTE — Telephone Encounter (Signed)
Prior authorization needed for Oxycodone 30 mg.

## 2022-04-03 ENCOUNTER — Other Ambulatory Visit (HOSPITAL_COMMUNITY): Payer: Self-pay

## 2022-04-03 ENCOUNTER — Telehealth: Payer: Self-pay

## 2022-04-03 ENCOUNTER — Telehealth: Payer: Self-pay | Admitting: Acute Care

## 2022-04-03 ENCOUNTER — Other Ambulatory Visit: Payer: Self-pay

## 2022-04-03 DIAGNOSIS — J441 Chronic obstructive pulmonary disease with (acute) exacerbation: Secondary | ICD-10-CM

## 2022-04-03 NOTE — Telephone Encounter (Signed)
Called patient and verified with her that she can do a simple walk test to qualify for oxygen. She was asking about a pOC. I told her that she can do a walk test in the office on the POC to see if she qualifies or not. And that if she does we send the order ot the DME company. I educated her on the qualifications that is needed for the POC and she verbalized understanding. Nothing further needed

## 2022-04-03 NOTE — Telephone Encounter (Signed)
Pa pending. Key [id number]. Will update with results

## 2022-04-03 NOTE — Telephone Encounter (Signed)
Pharmacy Patient Advocate Encounter   Received notification that prior authorization for oxyCODONE HCl 30MG  tablets is required/requested.  Per Test Claim: DUR Reject   PA submitted on 04/03/22 to (ins) [employer] via CoverMyMeds Key [id number] Status is pending

## 2022-04-03 NOTE — Telephone Encounter (Signed)
Noted by clinical staff. Nothing further needed  

## 2022-04-03 NOTE — Telephone Encounter (Signed)
Patient checking on message left for oxygen. Patient phone number is [phone].

## 2022-04-03 NOTE — Telephone Encounter (Signed)
Received prior authorization approval from [facility] for Oxycodone HCL 30 tab approved until 03/09/2023.

## 2022-04-07 NOTE — Progress Notes (Signed)
Medical records faxed to [employer] for neb machine approval [phone]

## 2022-04-09 ENCOUNTER — Other Ambulatory Visit: Payer: Self-pay | Admitting: Family Medicine

## 2022-04-09 DIAGNOSIS — G43809 Other migraine, not intractable, without status migrainosus: Secondary | ICD-10-CM

## 2022-04-10 ENCOUNTER — Encounter: Payer: Self-pay | Admitting: Acute Care

## 2022-04-10 ENCOUNTER — Ambulatory Visit: Payer: Medicare HMO | Admitting: Acute Care

## 2022-04-10 ENCOUNTER — Ambulatory Visit (INDEPENDENT_AMBULATORY_CARE_PROVIDER_SITE_OTHER): Payer: Medicare HMO | Admitting: Internal Medicine

## 2022-04-10 VITALS — BP 124/88 | HR 106 | Ht 63.5 in | Wt 205.0 lb

## 2022-04-10 DIAGNOSIS — Z86711 Personal history of pulmonary embolism: Secondary | ICD-10-CM | POA: Diagnosis not present

## 2022-04-10 DIAGNOSIS — Z01818 Encounter for other preprocedural examination: Secondary | ICD-10-CM | POA: Diagnosis not present

## 2022-04-10 DIAGNOSIS — R942 Abnormal results of pulmonary function studies: Secondary | ICD-10-CM | POA: Diagnosis not present

## 2022-04-10 DIAGNOSIS — J439 Emphysema, unspecified: Secondary | ICD-10-CM

## 2022-04-10 DIAGNOSIS — J9611 Chronic respiratory failure with hypoxia: Secondary | ICD-10-CM

## 2022-04-10 DIAGNOSIS — J441 Chronic obstructive pulmonary disease with (acute) exacerbation: Secondary | ICD-10-CM | POA: Diagnosis not present

## 2022-04-10 LAB — PULMONARY FUNCTION TEST
DL/VA % pred: 95 %
DL/VA: 3.96 ml/min/mmHg/L
DLCO cor % pred: 70 %
DLCO cor: 13.66 ml/min/mmHg
DLCO unc % pred: 71 %
DLCO unc: 13.71 ml/min/mmHg
FEF 25-75 Post: 0.43 L/sec
FEF 25-75 Pre: 0.96 L/sec
FEF2575-%Change-Post: -55 %
FEF2575-%Pred-Post: 21 %
FEF2575-%Pred-Pre: 48 %
FEV1-%Change-Post: -37 %
FEV1-%Pred-Post: 35 %
FEV1-%Pred-Pre: 56 %
FEV1-Post: 0.8 L
FEV1-Pre: 1.29 L
FEV1FVC-%Change-Post: -23 %
FEV1FVC-%Pred-Pre: 96 %
FEV6-%Change-Post: -19 %
FEV6-%Pred-Post: 48 %
FEV6-%Pred-Pre: 60 %
FEV6-Post: 1.41 L
FEV6-Pre: 1.75 L
FEV6FVC-%Change-Post: 0 %
FEV6FVC-%Pred-Post: 104 %
FEV6FVC-%Pred-Pre: 104 %
FVC-%Change-Post: -19 %
FVC-%Pred-Post: 46 %
FVC-%Pred-Pre: 58 %
FVC-Post: 1.41 L
FVC-Pre: 1.75 L
Post FEV1/FVC ratio: 57 %
Post FEV6/FVC ratio: 100 %
Pre FEV1/FVC ratio: 74 %
Pre FEV6/FVC Ratio: 100 %
RV % pred: 70 %
RV: 1.5 L
TLC % pred: 64 %
TLC: 3.22 L

## 2022-04-10 NOTE — Telephone Encounter (Signed)
OV notes and clearance form have been faxed back to [facility]. Nothing further needed at this time.

## 2022-04-10 NOTE — Patient Instructions (Addendum)
It is good to see you today. We have walked you today to qualify you for oxygen and for POC use.  Continue oxygen at 2 L [place] at rest and 3 L [place] with exertion. Oxygen saturations should always be > 88% Continue Symbicort and Spiriva as you have been doing. Rinse mouth after use. We have reviewed your PFT's today.  They are very stable compared to your last assessment You qualified for POC.  We have sent paperwork for your nebulizer today. Note your daily symptoms > remember "red flags" for COPD:  Increase in cough, increase in sputum production, increase in shortness of breath or activity intolerance. If you notice these symptoms, please call to be seen.    Follow up in 3 months with Judson Roch NP to ensure you are doing well.  We will fax the same surgical recommendations that were done in 12/2021. Same risks apply as at that assessment.  Please contact office for sooner follow up if symptoms do not improve or worsen or seek emergency care

## 2022-04-10 NOTE — Progress Notes (Unsigned)
History of Present Illness Laurie Schaefer is a 67 y.o. female  former smoker with DOE, Cough variant asthma, chronic respiratory failure with hypoxia  on oxygen at 3 L [place], COPD per PFT numbers, and PE in 01/2020, Eliquis treatment for a fib rvr.. She is followed by Dr. Sherene Sires Maintenance Symbicort and Spiriva Rescue albuterol Eliquis for atrial fibrillation Home oxygen 2 L at rest and 3 L with exertion    04/10/2022 Pt. Presents today to review PFT's ,and   for a POC walk test. This is  to qualify her for POC with a new DME company and to see if she can maintain sats on POC. She would like a POC to allow for better quality of life. She needs a light weight POC as she has issues with mobility. She was last seen in the office 01/02/2022. At that time she was having some non-specific chest pain. We did a CT Chest to evaluate for etiology.There were no acute findings on the imaging.  Pt is switching her DME as her insurance no longer  is approved for previous DME. She  has received her home oxygen and needs to walk today to qualify for POC. Her previous DME is picking up her POC on 04/13/2022, and she needs a POC from her new DME. We will walk her today to qualify her. She qualified after walk, she maintained sats of > 94% on 3 L pulsed oxygen.   She has had recent emergency surgery for gall bladder removal. Due to her emergency surgery her dental surgery was placed on hold. Surgery has been re-scheduled. Previous surgical clearance done 01/02/2022 still applies with all risks specified at that assessment.( See below)   Pt.  is currently on a prednisone taper for COPD flare. She started this about 2 weeks ago. She is down to one tablet.. She is compliant with her Symbicort and Spiriva. She feels she is returning to her baseline. Breath sounds are clear  She is tearful in the office today. She has had a very rough month. Getting her DME and equipment switched and having to re-qualify for her  therapies in addition to emergency surgery has been hard.   Pt states she has been having headaches . They start mid afternoon and continue until bedtime. Dr. Clent Ridges is managing this. It is impacting her sleep. She did not sleep this morning until 4 am so she is very tired.I have asked her to continue to follow up with Dr. Clent Ridges for this.   We sent paperwork in to [facility]  today for the nebulizer machine.Pt. needs the nebulizer machine as nebulized medications are more effective in treating for dyspnea, and improving her quality of life.   Pt has been walked today on the office POC. She was walked on 3 L pulsed air and her sats were maintained at > 94% at all times. This qualifies for POC.    PFT's reviewed today, and compared to 2019 testing 2019                               2024 FVC 1.78 and 56% >> 1.75 and 58% FEV1 1.12 and 46% >> 1.29 and 56% F/F Ratio 63 and 77% >> 74 and no ratio calculated SVC 2.77 and 88%      >> 1.71 and 57% TLC 4.10 and 82 %      >> 3.22 and 64% DLCO unc 15.88  and 67%>> 13.71 and 71%  DLCO cor  16.03 and 67%>> 13.66 and 70% VA 3.89 oand  78%                 3.45 and 74%  Pt had a hard time with PFT's so unsure how accurate they may be.  .   Test Results: CT Chest without contrast 01/13/2022 Subsegmental atelectasis noted posterior left upper lobe. No suspicious pulmonary nodule or mass. No focal airspace consolidation. No pleural effusion. No evidence for pulmonary edema. No acute findings in the chest. Specifically, no findings to explain the patient's history of chest pain. Cholelithiasis. Aortic Atherosclerosis (ICD10-I70.0).  Pre Op[ Risk Assessment done 01/02/2022>> Same risk applies, same recommendations. 1) RISK FOR PROLONGED MECHANICAL VENTILAION - > 48h   1A) Arozullah - Prolonged mech ventilation risk Arozullah Postperative Pulmonary Risk Score - for mech ventilation dependence >48h [employer], Ann Surg 2000, major non-cardiac  surgery) Comment Score  Type of surgery - abd ao aneurysm (27), thoracic (21), neurosurgery / upper abdominal / vascular (21), neck (11) Dental surgery 0  Emergency Surgery - (11)   0  ALbumin < 3 or poor nutritional state - (9) 4.3 0  BUN > 30 -  (8) 14 0  Partial or completely dependent functional status - (7)   0  COPD -  (6) Moderately severe 6  Age - 60 to 3 (4), > 70  (6)   4  TOTAL   10  Risk Stratifcation scores  - < 10 (0.5%), 11-19 (1.8%), 20-27 (4.2%), 28-40 (10.1%), >40 (26.6%)   0.5% increased risk  Should she require intubation>> 0.5% increased risk of prolonged intubation     2) RISK FOR POST OP PNEUMONIA Score source Risk  Lyndel Safe - Post Op Pnemounia risk  [email or web address] 3.9%  Calculated at 3.9%   R3) ISK FOR ANY POST-OP PULMONARY COMPLICATION Score source Risk  CANET/ARISCAT Score - risk for ANY/ALl pulmonary complications - > risk of in-hospital post-op pulmonary complications (composite including respiratory failure, respiratory infection, pleural effusion, atelectasis, pneumothorax, bronchospasm, aspiration pneumonitis) [email or web address] - based on age, anemia, pulse ox, resp infection prior 30d, incision site, duration of surgery, and emergency v elective surgery 1.6%   1.6% risk of in-hospital post-op pulmonary complications (composite including respiratory failure, respiratory infection, pleural effusion, atelectasis, pneumothorax, bronchospasm, aspiration pneumonitis)   Risk ameliorating factors are  noted in recommendations below     Major Pulmonary risks identified in the multifactorial risk analysis are but not limited to a) pneumonia; b) recurrent intubation risk; c) prolonged or recurrent acute respiratory failure needing mechanical ventilation; d) prolonged hospitalization; e) DVT/Pulmonary embolism; f) Acute Pulmonary edema   Recommend 1.Eliquis will need to  be held prior to procedure for wash out >> per cardiology 2. Short duration of surgery as much as possible to avoid hypercapnia  and avoid paralytic if possible 3. Recovery until patient is fully awake and recovered from anesthesia 4. Oxygen with saturation and EKG monitoring during entire procedure, end CO2 monitoring if available 5. Take inhalers prior to procedure  6. Take nebulizer machine and albuterol nebs to procedure in case they are needed.  7. Aggressive pulmonary toilet with o2, bronchodilatation, and incentive spirometry and early ambulation after procedure 8. Resume Eliquis post procedure based on surgical recommendations  9. Follow up in Pulmonary post procedure to ensure you have returned to baseline       Latest Ref Rng & Units 02/26/2022    4:16 PM 10/15/2021  10:00 AM 02/21/2021    1:48 PM  CBC  WBC 4.0 - 10.5 K/uL 9.5  8.9  8.1   Hemoglobin 12.0 - 15.0 g/dL 13.5  14.7  14.7   Hematocrit 36.0 - 46.0 % 39.6  44.2  43.3   Platelets 150 - 400 K/uL 307  318.0  297        Latest Ref Rng & Units 02/26/2022    4:16 PM 01/09/2022   11:48 AM 10/15/2021   10:00 AM  BMP  Glucose 70 - 99 mg/dL 113  99  105   BUN 8 - 23 mg/dL 8  12  14    Creatinine 0.44 - 1.00 mg/dL 0.62  0.70  0.79   BUN/Creat Ratio 12 - 28  17    Sodium 135 - 145 mmol/L 142  141  139   Potassium 3.5 - 5.1 mmol/L 3.6  3.4  3.2   Chloride 98 - 111 mmol/L 111  101  100   CO2 22 - 32 mmol/L 22  27  29    Calcium 8.9 - 10.3 mg/dL 8.9  9.3  9.7     BNP    Component Value Date/Time   BNP 24.6 08/19/2017 1107    ProBNP    Component Value Date/Time   PROBNP 74.0 07/16/2017 1549    PFT    Component Value Date/Time   FEV1PRE 1.29 04/10/2022 0909   FEV1POST 0.80 04/10/2022 0909   FVCPRE 1.75 04/10/2022 0909   FVCPOST 1.41 04/10/2022 0909   TLC 3.22 04/10/2022 0909   DLCOUNC 13.71 04/10/2022 0909   PREFEV1FVCRT 74 04/10/2022 0909   PSTFEV1FVCRT 57 04/10/2022 0909    No results found.   Past  medical hx Past Medical History:  Diagnosis Date   Arthritis    Bipolar disorder ([place])    Chronic neck pain    had previously seen Dr. Sandi Mariscal at [facility]    COPD (chronic obstructive pulmonary disease) [facility])    sees Dr. Lamonte Sakai    Depression    Fibromyalgia    Headache    Hypertension      Social History   Tobacco Use   Smoking status: Never    Passive exposure: Never   Smokeless tobacco: Never  Vaping Use   Vaping Use: Never used  Substance Use Topics   Alcohol use: No    Alcohol/week: 0.0 standard drinks of alcohol   Drug use: No    Ms.Stille reports that she has never smoked. She has never been exposed to tobacco smoke. She has never used smokeless tobacco. She reports that she does not drink alcohol and does not use drugs.  Tobacco Cessation: Never smoker   Past surgical hx, Family hx, Social hx all reviewed.  Current Outpatient Medications on File Prior to Visit  Medication Sig   albuterol (VENTOLIN HFA) 108 (90 Base) MCG/ACT inhaler INHALE 2 PUFFS INTO THE LUNGS EVERY 6 HOURS AS NEEDED FOR WHEEZING OR SHORTNESS OF BREATH   ARIPiprazole (ABILIFY) 20 MG tablet Take 1 tablet (20 mg total) by mouth daily.   atorvastatin (LIPITOR) 10 MG tablet Take 1 tablet (10 mg total) by mouth daily.   budesonide-formoterol (SYMBICORT) 160-4.5 MCG/ACT inhaler Inhale 2 puffs into the lungs in the morning and at bedtime.   ELIQUIS 5 MG TABS tablet TAKE 1 TABLET BY MOUTH TWICE A DAY   ibuprofen (ADVIL) 800 MG tablet Take 1 tablet (800 mg total) by mouth every 8 (eight) hours as needed.  LORazepam (ATIVAN) 2 MG tablet TAKE 1 TABLET(2 MG) BY MOUTH EVERY 6 HOURS AS NEEDED FOR ANXIETY   losartan-hydrochlorothiazide (HYZAAR) 100-25 MG tablet Take 0.5 tablets by mouth daily.   methocarbamol (ROBAXIN) 500 MG tablet Take 1 tablet (500 mg total) by mouth every 8 (eight) hours as needed for muscle spasms.   metoprolol tartrate (LOPRESSOR) 100 MG tablet Take 1 tablet by mouth once  for procedure.   ondansetron (ZOFRAN) 8 MG tablet Take 1 tablet (8 mg total) by mouth every 6 (six) hours as needed for nausea or vomiting.   [START ON 05/26/2022] oxycodone (ROXICODONE) 30 MG immediate release tablet Take 1 tablet (30 mg total) by mouth every 4 (four) hours as needed for pain.   OXYGEN Place 3 L into the nose continuous.   pantoprazole (PROTONIX) 40 MG tablet Take 1 tablet (40 mg total) by mouth daily.   polyethylene glycol powder (GLYCOLAX/MIRALAX) 17 GM/SCOOP powder Take 17 g by mouth 2 (two) times daily as needed.   potassium chloride (KLOR-CON) 10 MEQ tablet Take 1 tablet (10 mEq total) by mouth daily.   prazosin (MINIPRESS) 2 MG capsule Take 1 capsule (2 mg total) by mouth at bedtime. (Patient taking differently: Take 2 mg by mouth at bedtime as needed (sleep).)   pregabalin (LYRICA) 200 MG capsule Take 1 capsule (200 mg total) by mouth at bedtime.   pregabalin (LYRICA) 50 MG capsule Take one capsule in the morning and one in the afternoon   Psyllium 25 % POWD Take 1-2 teaspoons once daily with first meal of the day.   SUMAtriptan (IMITREX) 100 MG tablet TAKE 1 TABLET BY MOUTH AS NEEDED FOR MIGRAINE. MAY REPEAT IN 2 HOURS IF HEADACHE PERSISTS OR RECURS.   Tiotropium Bromide Monohydrate (SPIRIVA RESPIMAT) 2.5 MCG/ACT AERS Inhale 2 puffs into the lungs daily.   zolpidem (AMBIEN) 10 MG tablet TAKE 1 TABLET BY MOUTH EVERY DAY AT BEDTIME AS NEEDED FOR SLEEP   No current facility-administered medications on file prior to visit.     Allergies  Allergen Reactions   Ciprofloxacin Other (See Comments)    Per patient makes her "wacko"    Codeine Nausea Only   Latex Itching    Review Of Systems:  Constitutional:   No  weight loss, night sweats,  Fevers, chills, ++fatigue, or  lassitude.  HEENT:   + headaches,  No Difficulty swallowing,  Tooth/dental problems, or  Sore throat,                No sneezing, itching, ear ache, nasal congestion, post nasal drip,   CV:  No chest  pain,  Orthopnea, PND, swelling in lower extremities, anasarca, dizziness, palpitations, syncope.   GI  No heartburn, indigestion, abdominal pain, nausea, vomiting, diarrhea, change in bowel habits, loss of appetite, bloody stools.   Resp: + shortness of breath with exertion. less or at rest.  No excess mucus, no productive cough,  No non-productive cough,  No coughing up of blood.  No change in color of mucus.  No wheezing.  No chest wall deformity  Skin: no rash or lesions.  GU: no dysuria, change in color of urine, no urgency or frequency.  No flank pain, no hematuria   MS:  No joint pain or swelling.  + decreased range of motion.  No back pain.  Psych:  No change in mood or affect. No depression or anxiety.  No memory loss.   Vital Signs BP 124/88   Pulse (!) 106  Ht 5' 3.5" (1.613 m)   Wt 205 lb (93 kg)   SpO2 100%   BMI 35.74 kg/m    Physical Exam:  General- No distress,  A&Ox3, pleasant and tearful, tired ENT: No sinus tenderness, TM clear, pale nasal mucosa, no oral exudate,no post nasal drip, no LAN Cardiac: S1, S2, regular rate and rhythm, no murmur Chest: No wheeze/ rales/ dullness; no accessory muscle use, no nasal flaring, no sternal retractions Abd.: Soft Non-tender, ND, BS +, Body mass index is 35.74 kg/m.  Ext: No clubbing cyanosis, edema Neuro:  normal strength, MAE x 4, A&O x 3 Skin: No rashes, warm and dry, no lesions  Psych: normal mood and behavior   Assessment/Plan COPD PFT eval Chronic hypoxic respiratory failure Hx. PE on Eliquis Suspected OSA Qualifying walk for POC Documentation for need for nebulizer  Surgical Clearance for oral surgery Recent emergency cholecystectomy Chronic narcotic use  Plan  It is good to see you today. Continue oxygen at 2 L [place] at rest and 3 L  with exertion. Oxygen saturations should always be > 88% Continue Symbicort and Spiriva as you have been doing. Rinse mouth after use. We have reviewed your PFT's,  and compared them to the previous test done in 2019 You have qualified for POC today in the office  We have sent paperwork for your nebulizer today. Note your daily symptoms > remember "red flags" for COPD:  Increase in cough, increase in sputum production, increase in shortness of breath or increase activity intolerance. If you notice these symptoms, please call to be seen.    Follow up in 3 months with Judson Roch NP to ensure you are doing well.  We will fax the same surgical recommendations that were done in 12/2021. Same risks apply as at that assessment.  We will consider home sleep for OSA at follow up. Please contact office for sooner follow up if symptoms do not improve or worsen or seek emergency care     I spent 60 minutes dedicated to the care of this patient on the date of this encounter to include pre-visit review of records, face-to-face time with the patient discussing conditions above, post visit ordering of testing, clinical documentation with the electronic health record, making appropriate referrals as documented, and communicating necessary information to the patient's healthcare team.   Magdalen Spatz, NP 04/10/2022  10:50 AM

## 2022-04-10 NOTE — Patient Instructions (Signed)
Full PFT Performed Today. 

## 2022-04-10 NOTE — Progress Notes (Signed)
Full PFT Performed Today. 

## 2022-04-11 ENCOUNTER — Encounter: Payer: Self-pay | Admitting: Acute Care

## 2022-04-13 ENCOUNTER — Telehealth: Payer: Self-pay | Admitting: Acute Care

## 2022-04-13 ENCOUNTER — Other Ambulatory Visit: Payer: Self-pay

## 2022-04-13 NOTE — Telephone Encounter (Signed)
I called [employer] and spoke with a Shelly and she stated pt has a balance over 27 days old and that being the reason pt could not get her POC delivered. Shelly also informed me that once the balance is paid [employer] will care take of the pt's POC order. I reiterated what Shell had told me to the pt. Pt verbalized understanding. And stated she will call [employer] to take care of her balance so she can get her POC. Nothing further is needed.

## 2022-04-14 ENCOUNTER — Telehealth: Payer: Self-pay | Admitting: Acute Care

## 2022-04-14 ENCOUNTER — Encounter: Payer: Medicare HMO | Admitting: Physician Assistant

## 2022-04-14 NOTE — Telephone Encounter (Signed)
Order fixed

## 2022-04-14 NOTE — Telephone Encounter (Signed)
Spoke with [employer] who states they have the order and patient was scheduled for an evaluation on 2/9 at [place] patient back and discussed what was needed. Patient states Suanne Marker in scheduling is making her redo the walk test, however patient just had a qualifying walk test Friday 2/2 with Judson Roch. Contacted Rhonda at [employer] and asked why patient needed another walk test. Suanne Marker states she cannot see notes in EPIC and she had to send an email to intake team to pull this information. I confirmed that email was already sent to the intake team and will wait to ensure they can see walk results in the chart. Informed Pam I will follow up with [employer] later today. Also left a voicemail with Darlina Guys to give me a call back.

## 2022-04-14 NOTE — Telephone Encounter (Signed)
I spoke to 3 different people from [employer] got hung up on 3 times. Then I called Leroy Sea New because he helps [place] with our Cpap. He gave me the name of the [employer] rep Arloa Koh and said the [employer] reps can see the walk order in Epic. Now it's just up to [employer] to get the walk order from epic and get the pt her POC as ordered.

## 2022-04-14 NOTE — Telephone Encounter (Addendum)
I spoke with Melissa from [employer] and the order needs to have the comment "3L pulsed air" as POC's are not continuous flow- only pulse dose. Per Sarah's note, "Pt has been walked today on the office POC. She was walked on 3 L pulsed air and her sats were maintained at > 94% at all times. This qualifies for POC. " Spoke with Brad (per DPR) as patient was asleep to inform that [employer] will be reaching out once the order was fixed.    Please correct order so order can be fulfilled.

## 2022-04-15 ENCOUNTER — Telehealth (HOSPITAL_COMMUNITY): Payer: Self-pay | Admitting: Emergency Medicine

## 2022-04-15 ENCOUNTER — Telehealth: Payer: Self-pay | Admitting: Family Medicine

## 2022-04-15 NOTE — Telephone Encounter (Signed)
Laurie Schaefer from [place] - called to inform MD that he is unable to reach Pt at both numbers provided.  Both phone numbers confirmed as the Pt's.  Order for Nebulizer will be put on hold until Pt can be reached.   [phone]

## 2022-04-15 NOTE — Telephone Encounter (Signed)
Attempted to call patient regarding upcoming cardiac CT appointment. °Left message on voicemail with name and callback number °Iyad Deroo RN Navigator Cardiac Imaging °[facility] °[phone] Office °[phone] Cell ° °

## 2022-04-16 ENCOUNTER — Ambulatory Visit
Admission: RE | Admit: 2022-04-16 | Discharge: 2022-04-16 | Disposition: A | Discharge status: Home / Self Care | Payer: Medicare HMO | Source: Ambulatory Visit | Attending: Cardiovascular Disease | Admitting: Cardiovascular Disease

## 2022-04-16 DIAGNOSIS — R072 Precordial pain: Secondary | ICD-10-CM | POA: Insufficient documentation

## 2022-04-16 MED ORDER — IOHEXOL 350 MG/ML SOLN
100.0000 mL | Freq: Once | INTRAVENOUS | Status: AC | PRN
  Administered 2022-04-16: 100 mL via INTRAVENOUS

## 2022-04-16 MED ORDER — NITROGLYCERIN 0.4 MG SL SUBL
0.8000 mg | SUBLINGUAL_TABLET | Freq: Once | SUBLINGUAL | Status: AC
  Administered 2022-04-16: 0.8 mg via SUBLINGUAL

## 2022-04-16 NOTE — Progress Notes (Signed)
Patient arrived for Cardiac CT. Staff maneuvered patient in best position of comfort on CT table due to chronic pain. Patient indicated she thought someone was rasing their voice at her during the scan. Explained the voice is a recording and the volume was turned up for patients to hear. Verbalized understanding and patient stated then the volume should be turned down.  Drank water after. Vital signs stable encourage to drink water throughout day. Reasons explained and verbalized understanding. Ambulated steady gait with her portable oxygen 2L nasal cannula.

## 2022-04-17 NOTE — Telephone Encounter (Signed)
Called patient to follow up on POC order- had to leave a voicemail. Called [employer] who states POC machine was assigned to a driver, but the driver was unable to get in touch with the patient.

## 2022-04-21 ENCOUNTER — Encounter: Payer: Self-pay | Admitting: Physician Assistant

## 2022-04-21 ENCOUNTER — Ambulatory Visit: Payer: Medicare HMO | Admitting: Physician Assistant

## 2022-04-21 ENCOUNTER — Ambulatory Visit (INDEPENDENT_AMBULATORY_CARE_PROVIDER_SITE_OTHER): Payer: Medicare HMO | Admitting: Physician Assistant

## 2022-04-21 VITALS — BP 123/84 | HR 72 | Temp 98.3°F | Ht 63.5 in | Wt 200.0 lb

## 2022-04-21 VITALS — BP 126/83 | HR 97 | Temp 98.3°F | Ht 63.5 in | Wt 200.0 lb

## 2022-04-21 DIAGNOSIS — Z09 Encounter for follow-up examination after completed treatment for conditions other than malignant neoplasm: Secondary | ICD-10-CM

## 2022-04-21 DIAGNOSIS — K8010 Calculus of gallbladder with chronic cholecystitis without obstruction: Secondary | ICD-10-CM

## 2022-04-21 NOTE — Patient Instructions (Signed)

## 2022-04-21 NOTE — Progress Notes (Signed)
[facility] POST-OP OFFICE VISIT  04/21/2022  HPI: Laurie Schaefer is a 67 y.o. female presents for her first follow up despite being almost 2 months s/p robotic assisted laparoscopic cholecystectomy for [facility] with Dr Christian Mate  She is doing well No complaints Had diarrhea for about 2 weeks; now resolved No fever, chills, emesis Incisions healed well Ambulating   Vital signs: BP 123/84   Pulse 72   Temp 98.3 F (36.8 C)   Ht 5' 3.5" (1.613 m)   Wt 200 lb (90.7 kg)   SpO2 97%   BMI 34.87 kg/m    Physical Exam: Constitutional: Well appearing female, NAD Abdomen: Soft, non-tender, non-distended, no rebound/guarding Skin: Laparoscopic incisions are healing well, no erythema or drainage   Assessment/Plan: This is a 67 y.o. female ~2 months s/p robotic assisted laparoscopic cholecystectomy for [facility] with Dr Christian Mate   - Pain control prn  - Reviewed wound care recommendation  - She has certainly completed all restrictions at this point  - Reviewed surgical pathology; [facility]  - She can follow up on as needed basis; She understands to call with questions/concerns  -- Edison Simon, PA-C [facility] 04/21/2022, 2:39 PM M-F: 7am - 4pm

## 2022-04-21 NOTE — Progress Notes (Signed)
ERROR

## 2022-04-22 ENCOUNTER — Telehealth: Payer: Self-pay | Admitting: Acute Care

## 2022-04-22 NOTE — Telephone Encounter (Signed)
-----   Message from Rosana Berger, [place] sent at 04/22/2022  2:59 PM EST -----  ----- Message ----- From: Magdalen Spatz, NP Sent: 04/10/2022   5:35 PM EST To: [facility] Triage Pool  Please get patient in for sleep consult within next month

## 2022-04-22 NOTE — Telephone Encounter (Signed)
Spoke with pt who was about to have dental tomorrow. She wants to get through that and recover prior to scheduling sleep consult.

## 2022-04-29 ENCOUNTER — Other Ambulatory Visit: Payer: Self-pay | Admitting: Family Medicine

## 2022-04-30 NOTE — Telephone Encounter (Signed)
Last refill-10/06/21-120 tabs, 5 refills Last OV-03/27/22  No future OV scheduled.

## 2022-05-15 ENCOUNTER — Ambulatory Visit: Payer: Medicare HMO | Admitting: Family Medicine

## 2022-05-18 ENCOUNTER — Ambulatory Visit (INDEPENDENT_AMBULATORY_CARE_PROVIDER_SITE_OTHER): Payer: Medicare HMO | Admitting: Family Medicine

## 2022-05-18 ENCOUNTER — Encounter: Payer: Self-pay | Admitting: Family Medicine

## 2022-05-18 VITALS — BP 120/78 | HR 91 | Temp 97.8°F | Wt 203.0 lb

## 2022-05-18 DIAGNOSIS — G4700 Insomnia, unspecified: Secondary | ICD-10-CM | POA: Diagnosis not present

## 2022-05-18 DIAGNOSIS — B379 Candidiasis, unspecified: Secondary | ICD-10-CM | POA: Diagnosis not present

## 2022-05-18 MED ORDER — PREGABALIN 50 MG PO CAPS: ORAL_CAPSULE | ORAL | 5 refills | Status: DC

## 2022-05-18 MED ORDER — METHOCARBAMOL 500 MG PO TABS: 500.0000 mg | ORAL_TABLET | Freq: Three times a day (TID) | ORAL | 5 refills | Status: DC | PRN

## 2022-05-18 MED ORDER — PREGABALIN 200 MG PO CAPS: 200.0000 mg | ORAL_CAPSULE | Freq: Every day | ORAL | 5 refills | Status: DC

## 2022-05-18 MED ORDER — CLOTRIMAZOLE-BETAMETHASONE 1-0.05 % EX CREA: 1.0000 | TOPICAL_CREAM | Freq: Two times a day (BID) | CUTANEOUS | 5 refills | Status: DC

## 2022-05-18 MED ORDER — TEMAZEPAM 30 MG PO CAPS: 30.0000 mg | ORAL_CAPSULE | Freq: Every evening | ORAL | 5 refills | Status: DC | PRN

## 2022-05-18 NOTE — Progress Notes (Signed)
   Subjective:    Patient ID: Laurie Schaefer, female    DOB: 11-Dec-1955, 67 y.o.   MRN: [id number]  HPI Here for 2 issues. First she has had a rash under the breasts, in her abdomen fold, and in the groins for the past month. This itches and burns. She has tried applying A and D ointment and Neosporin with no relief. Also the Zolpidem no longer helps with sleep the way it used to. She has been on this for years.    Review of Systems  Constitutional: Negative.   Respiratory:  Positive for shortness of breath. Negative for cough.   Cardiovascular: Negative.   Skin:  Positive for rash.  Psychiatric/Behavioral:  Positive for sleep disturbance.        Objective:   Physical Exam Constitutional:      General: She is not in acute distress. Cardiovascular:     Rate and Rhythm: Normal rate and regular rhythm.     Pulses: Normal pulses.  Pulmonary:     Effort: Pulmonary effort is normal.     Breath sounds: Normal breath sounds.  Skin:    Comments: The areas above have a macular erythematous rash with satellite lesions   Neurological:     Mental Status: She is alert.           Assessment & Plan:  She has Candidal rashes, and she will treat these with Lotrisone cream BID. She knows to make sure her skin is completely dry before applying this. Also for her insomnia, she will stop Zolpidem and try Temazepam at bedtime.  Alysia Penna, MD

## 2022-05-19 ENCOUNTER — Other Ambulatory Visit: Payer: Self-pay

## 2022-05-19 ENCOUNTER — Encounter: Payer: Self-pay | Admitting: Family Medicine

## 2022-05-19 DIAGNOSIS — B379 Candidiasis, unspecified: Secondary | ICD-10-CM

## 2022-05-19 MED ORDER — KETOCONAZOLE 2 % EX CREA: TOPICAL_CREAM | CUTANEOUS | 3 refills | Status: AC

## 2022-05-19 NOTE — Telephone Encounter (Signed)
I understand. Call in Ketoconazole 2% cream to apply BID as needed for yeast rashes, 45 grams with 3 rf

## 2022-05-21 ENCOUNTER — Other Ambulatory Visit: Payer: Self-pay

## 2022-05-22 ENCOUNTER — Encounter: Payer: Self-pay | Admitting: Family Medicine

## 2022-05-28 ENCOUNTER — Other Ambulatory Visit: Payer: Self-pay

## 2022-05-29 ENCOUNTER — Other Ambulatory Visit: Payer: Self-pay

## 2022-05-29 NOTE — Telephone Encounter (Signed)
Pt PA for Temazepam sent to pt plan SAHER HICKEL (Key: [id number])  form thumbnail

## 2022-06-02 NOTE — Telephone Encounter (Signed)
Pt Temazepam PA was approved

## 2022-08-24 ENCOUNTER — Telehealth: Payer: Self-pay | Admitting: Family Medicine

## 2022-08-24 ENCOUNTER — Telehealth: Payer: Self-pay | Admitting: Acute Care

## 2022-08-24 NOTE — Telephone Encounter (Signed)
Help with pulse oxmeter

## 2022-08-24 NOTE — Telephone Encounter (Signed)
Pt has some questions about her pulse oxmeter and would like a call back to discuss.

## 2022-08-24 NOTE — Telephone Encounter (Signed)
Pt has some questions about her pulse oxmeter and would like a call back to discuss.     She needs

## 2022-08-24 NOTE — Telephone Encounter (Signed)
Laurie Schaefer spoke with the pt and stated she needed to schedule appt  [name] called pt back to schedule appt and there was no answer- LMTCB

## 2022-08-24 NOTE — Telephone Encounter (Signed)
Call cell phone

## 2022-08-25 NOTE — Telephone Encounter (Signed)
Called pt left a message to call the office back. 

## 2022-08-26 NOTE — Telephone Encounter (Signed)
Spoke to pt. Made apt. For her to see SG on July2nd

## 2022-08-28 NOTE — Telephone Encounter (Signed)
Called several times left messages no call back

## 2022-08-31 ENCOUNTER — Encounter: Payer: Self-pay | Admitting: Family Medicine

## 2022-08-31 ENCOUNTER — Ambulatory Visit (INDEPENDENT_AMBULATORY_CARE_PROVIDER_SITE_OTHER): Payer: Medicare HMO | Admitting: Family Medicine

## 2022-08-31 VITALS — BP 124/90 | HR 82 | Temp 98.9°F | Wt 213.0 lb

## 2022-08-31 DIAGNOSIS — M797 Fibromyalgia: Secondary | ICD-10-CM | POA: Diagnosis not present

## 2022-08-31 DIAGNOSIS — F1190 Opioid use, unspecified, uncomplicated: Secondary | ICD-10-CM | POA: Diagnosis not present

## 2022-08-31 MED ORDER — OXYCODONE HCL 30 MG PO TABS: 30.0000 mg | ORAL_TABLET | ORAL | 0 refills | Status: DC | PRN

## 2022-08-31 MED ORDER — PREDNISONE 10 MG PO TABS: 10.0000 mg | ORAL_TABLET | Freq: Every day | ORAL | 3 refills | Status: DC | PRN

## 2022-08-31 NOTE — Progress Notes (Signed)
   Subjective:    Patient ID: Laurie Schaefer, female    DOB: May 23, 1955, 67 y.o.   MRN: [id number]  HPI Here for pain management. She has been doing fairly well, but the hot weather bothers her. She has taken low dose Prednisone in the past, and this has really helped her pain levels and well as her breathing. She asks to try this again.    Review of Systems  Constitutional: Negative.   Musculoskeletal:  Positive for myalgias.       Objective:   Physical Exam Constitutional:      Appearance: Normal appearance.  Neurological:     Mental Status: She is alert.           Assessment & Plan:  Pain management.  Indication for chronic opioid: fibromyalgia  Medication and dose: Oxycodone 30 mg  # pills per month: 180 Last UDS date: 08-31-22 Opioid Treatment Agreement signed (Y/N): 01-15-20 Opioid Treatment Agreement last reviewed with patient:  08-31-22 [facility] reviewed this encounter (include red flags): Yes Med were refilled.  Gershon Crane, MD

## 2022-09-02 LAB — DRUG MONITOR, PANEL 1, W/CONF, URINE
Alphahydroxyalprazolam: NEGATIVE ng/mL (ref <25)
Alphahydroxymidazolam: NEGATIVE ng/mL (ref <50)
Alphahydroxytriazolam: NEGATIVE ng/mL (ref <50)
Aminoclonazepam: NEGATIVE ng/mL (ref <25)
Amphetamines: NEGATIVE ng/mL (ref <500)
Barbiturates: NEGATIVE ng/mL (ref <300)
Benzodiazepines: POSITIVE ng/mL — AB (ref <100)
Cocaine Metabolite: NEGATIVE ng/mL (ref <150)
Codeine: NEGATIVE ng/mL (ref <50)
Creatinine: 105.1 mg/dL (ref >=20.0)
Hydrocodone: NEGATIVE ng/mL (ref <50)
Hydromorphone: NEGATIVE ng/mL (ref <50)
Hydroxyethylflurazepam: NEGATIVE ng/mL (ref <50)
Lorazepam: 3111 ng/mL — ABNORMAL HIGH (ref <50)
Marijuana Metabolite: NEGATIVE ng/mL (ref <20)
Methadone Metabolite: NEGATIVE ng/mL (ref <100)
Morphine: NEGATIVE ng/mL (ref <50)
Nordiazepam: NEGATIVE ng/mL (ref <50)
Norhydrocodone: NEGATIVE ng/mL (ref <50)
Noroxycodone: 8379 ng/mL — ABNORMAL HIGH (ref <50)
Opiates: NEGATIVE ng/mL (ref <100)
Oxazepam: 1866 ng/mL — ABNORMAL HIGH (ref <50)
Oxidant: NEGATIVE ug/mL (ref <200)
Oxycodone: 7392 ng/mL — ABNORMAL HIGH (ref <50)
Oxycodone: POSITIVE ng/mL — AB (ref <100)
Oxymorphone: 8785 ng/mL — ABNORMAL HIGH (ref <50)
Phencyclidine: NEGATIVE ng/mL (ref <25)
Temazepam: >6250 ng/mL — ABNORMAL HIGH (ref <50)
pH: 5.8 (ref 4.5–9.0)

## 2022-09-02 LAB — DM TEMPLATE

## 2022-09-08 ENCOUNTER — Ambulatory Visit: Payer: Medicare HMO | Admitting: Acute Care

## 2022-10-07 ENCOUNTER — Other Ambulatory Visit: Payer: Self-pay | Admitting: Family Medicine

## 2022-11-04 ENCOUNTER — Other Ambulatory Visit: Payer: Self-pay | Admitting: Family Medicine

## 2022-11-04 NOTE — Telephone Encounter (Signed)
Pt LOV was on 08/31/22 Last refill was done on 05/04/22 Please advise

## 2022-11-23 ENCOUNTER — Ambulatory Visit (INDEPENDENT_AMBULATORY_CARE_PROVIDER_SITE_OTHER): Payer: Medicare HMO | Admitting: Family Medicine

## 2022-11-23 ENCOUNTER — Encounter: Payer: Self-pay | Admitting: Family Medicine

## 2022-11-23 VITALS — BP 120/78 | HR 88 | Temp 98.2°F | Wt 216.0 lb

## 2022-11-23 DIAGNOSIS — T50901A Poisoning by unspecified drugs, medicaments and biological substances, accidental (unintentional), initial encounter: Secondary | ICD-10-CM

## 2022-11-23 DIAGNOSIS — G4700 Insomnia, unspecified: Secondary | ICD-10-CM

## 2022-11-23 DIAGNOSIS — F319 Bipolar disorder, unspecified: Secondary | ICD-10-CM | POA: Diagnosis not present

## 2022-11-23 DIAGNOSIS — M159 Polyosteoarthritis, unspecified: Secondary | ICD-10-CM

## 2022-11-23 DIAGNOSIS — M797 Fibromyalgia: Secondary | ICD-10-CM

## 2022-11-23 MED ORDER — DULOXETINE HCL 30 MG PO CPEP: 30.0000 mg | ORAL_CAPSULE | Freq: Every day | ORAL | 5 refills | Status: DC

## 2022-11-23 NOTE — Progress Notes (Signed)
Subjective:    Patient ID: Laurie Schaefer, female    DOB: [birth year], 67 y.o.   MRN: [id number]  HPI Here with her partner for a number of issues. First we treat her for chronic joint pains from OA as well as fibromyalgia pains. She takes Oxycodone 6 times daily, as well as Lyrica. She had been taking 200 mg of Lyrica on an as needed basis if she cannot go to sleep at night because of muscle pains. She usally takes Temazepam and a Hydrcodone pill at bedtime, and she waits several hours to take a Lyrica if needed. However one night last week she took all three of these medications at the same time at bedtime. Shortly after that she suddenly became weak all over, she was unsteady on her feet, and she had trouble speaking. Her partner wanted to call 911 because he was worried about a possible stroke, but he did not after insisted he not do so. Since then she has improved but she still feels like she is weak all over, and she thinks her speech is not back to normal. He partner thinks she is walking and talking at her baseline. She asks if she had a TIA. In addition, she has been taking Abilify for her bipolar disorder for the past year, which was started by a psychiatrist. She says this makes her sleepy, so she asks if we can switch her to Cymbalta instead. She has used this in the past, and it helped her pain somewhat.    Review of Systems  Constitutional:  Positive for fatigue.  Respiratory: Negative.    Cardiovascular: Negative.   Gastrointestinal: Negative.   Genitourinary: Negative.   Neurological:  Positive for speech difficulty and weakness. Negative for dizziness, tremors, seizures, syncope, facial asymmetry, light-headedness, numbness and headaches.  Psychiatric/Behavioral:  Positive for dysphoric mood and sleep disturbance. Negative for agitation, behavioral problems, confusion, decreased concentration, hallucinations, self-injury and suicidal ideas. The patient is nervous/anxious. The  patient is not hyperactive.        Objective:   Physical Exam Constitutional:      General: She is not in acute distress.    Appearance: Normal appearance.     Comments: She walks unassisted   Cardiovascular:     Rate and Rhythm: Normal rate and regular rhythm.     Pulses: Normal pulses.     Heart sounds: Normal heart sounds.  Pulmonary:     Effort: Pulmonary effort is normal.     Breath sounds: Normal breath sounds.  Neurological:     General: No focal deficit present.     Mental Status: She is alert and oriented to person, place, and time.     Cranial Nerves: No cranial nerve deficit.     Motor: No weakness.     Coordination: Coordination normal.     Gait: Gait normal.  Psychiatric:        Mood and Affect: Mood normal.        Behavior: Behavior normal.        Thought Content: Thought content normal.           Assessment & Plan:  I believe the incident she had last week was not a stroke or TIA, but instead this was the result of an accidental overdose. She took 3 powerful and sedating medications at the same time, and this caused her to have the symptoms above. Today she seems to be at her baseline, and she has no trouble speaking. We  agreed that she will never take Oxycodone and Lyrica together again. She will wait at least 2 hours passes between taking them. She will decrease the Lyrica to only 50 mg at night as needed. We will also stop Abilify and start her back on Cymbalta 30 mg daily. She will follow up in 3-4 weeks. We spent a total of ( 33  ) minutes reviewing records and discussing these issues.  Gershon Crane, MD

## 2022-11-24 ENCOUNTER — Encounter: Payer: Self-pay | Admitting: Family Medicine

## 2022-11-24 ENCOUNTER — Telehealth: Payer: Self-pay | Admitting: Family Medicine

## 2022-11-24 NOTE — Telephone Encounter (Signed)
Prescription Request  11/24/2022  LOV: 11/23/2022  What is the name of the medication or equipment? ketoconazole (NIZORAL) 2 % cream   Have you contacted your pharmacy to request a refill? No   Which pharmacy would you like this sent to?  [id number] [place], [place] - [address] AT [employer] OF [address] [place] [address] Phone: [phone] Fax: [phone]     Patient notified that their request is being sent to the clinical staff for review and that they should receive a response within 2 business days.   Please advise at Mobile [phone] (mobile)

## 2022-11-24 NOTE — Telephone Encounter (Signed)
Pt called back to say she wants the one with prednisone in it.   Also, Pt states she is out of her temazepam (RESTORIL) 30 MG capsule

## 2022-11-25 MED ORDER — TEMAZEPAM 30 MG PO CAPS: 30.0000 mg | ORAL_CAPSULE | Freq: Every evening | ORAL | 5 refills | Status: DC | PRN

## 2022-11-25 NOTE — Telephone Encounter (Signed)
Done

## 2022-11-25 NOTE — Telephone Encounter (Signed)
We sent in a RX for Clotrimazole-Betamethasone cream in March and it had 5 refills. She should be able to ask for a refill at her pharmacy

## 2022-11-26 NOTE — Telephone Encounter (Signed)
Pt notified via My Chart

## 2022-12-04 ENCOUNTER — Telehealth: Payer: Self-pay

## 2022-12-04 MED ORDER — OXYCODONE HCL 30 MG PO TABS: 30.0000 mg | ORAL_TABLET | ORAL | 0 refills | Status: DC | PRN

## 2022-12-04 NOTE — Progress Notes (Signed)
   12/04/2022  Patient ID: Laurie Schaefer, female   DOB: Oct 22, 1955, 67 y.o.   MRN: [id number]  Attempted to contact patient to return her call. Was informed she called the office asking to speak with me. Left HIPAA compliant message for patient to return my call at their convenience.   Sherrill Raring, PharmD Clinical Pharmacist [phone]

## 2022-12-04 NOTE — Telephone Encounter (Signed)
Sent to [place] 

## 2022-12-21 ENCOUNTER — Telehealth: Payer: Self-pay | Admitting: Internal Medicine

## 2022-12-21 NOTE — Telephone Encounter (Signed)
Patient wants to know if we carry to cord for the oxygen. Pt cord was throw away my accident.

## 2022-12-24 NOTE — Telephone Encounter (Signed)
Left message on machine for patient to call and be more specific. She has also been notified to reach out to DME company for supplies as needed. Unsure exactly what she needed.

## 2023-01-03 ENCOUNTER — Other Ambulatory Visit: Payer: Self-pay | Admitting: Family Medicine

## 2023-01-03 DIAGNOSIS — G43809 Other migraine, not intractable, without status migrainosus: Secondary | ICD-10-CM

## 2023-01-07 MED ORDER — PREGABALIN 50 MG PO CAPS: ORAL_CAPSULE | ORAL | 5 refills | Status: DC

## 2023-01-07 MED ORDER — ATORVASTATIN CALCIUM 10 MG PO TABS: 10.0000 mg | ORAL_TABLET | Freq: Every day | ORAL | 3 refills | Status: DC

## 2023-01-07 MED ORDER — PREDNISONE 10 MG PO TABS: 10.0000 mg | ORAL_TABLET | Freq: Every day | ORAL | 3 refills | Status: DC | PRN

## 2023-01-07 NOTE — Telephone Encounter (Signed)
I sent in refills for Pregabalin, Prednisone, and Atorvastatin. She needs a PMV for any Oxycodone refills

## 2023-01-07 NOTE — Telephone Encounter (Signed)
Pt notified via My Chart

## 2023-01-11 ENCOUNTER — Encounter: Payer: Self-pay | Admitting: Family Medicine

## 2023-01-11 ENCOUNTER — Ambulatory Visit (INDEPENDENT_AMBULATORY_CARE_PROVIDER_SITE_OTHER): Payer: Medicare HMO | Admitting: Family Medicine

## 2023-01-11 VITALS — BP 120/78 | HR 111 | Temp 98.1°F | Wt 211.0 lb

## 2023-01-11 DIAGNOSIS — G43809 Other migraine, not intractable, without status migrainosus: Secondary | ICD-10-CM | POA: Diagnosis not present

## 2023-01-11 DIAGNOSIS — F1190 Opioid use, unspecified, uncomplicated: Secondary | ICD-10-CM | POA: Diagnosis not present

## 2023-01-11 DIAGNOSIS — N390 Urinary tract infection, site not specified: Secondary | ICD-10-CM

## 2023-01-11 DIAGNOSIS — M797 Fibromyalgia: Secondary | ICD-10-CM

## 2023-01-11 LAB — POC URINALSYSI DIPSTICK (AUTOMATED)
Blood, UA: NEGATIVE
Glucose, UA: NEGATIVE
Ketones, UA: NEGATIVE
Nitrite, UA: NEGATIVE
Protein, UA: POSITIVE — AB
Spec Grav, UA: 1.025 (ref 1.010–1.025)
Urobilinogen, UA: 0.2 U/dL
pH, UA: 5 (ref 5.0–8.0)

## 2023-01-11 MED ORDER — NITROFURANTOIN MONOHYD MACRO 100 MG PO CAPS: 100.0000 mg | ORAL_CAPSULE | Freq: Two times a day (BID) | ORAL | 0 refills | Status: DC

## 2023-01-11 MED ORDER — TOPIRAMATE 50 MG PO TABS: 50.0000 mg | ORAL_TABLET | Freq: Every day | ORAL | 2 refills | Status: DC

## 2023-01-11 MED ORDER — OXYCODONE HCL 30 MG PO TABS: 30.0000 mg | ORAL_TABLET | ORAL | 0 refills | Status: DC | PRN

## 2023-01-11 NOTE — Addendum Note (Signed)
Addended by: Carola Rhine on: 01/11/2023 04:59 PM   Modules accepted: Orders

## 2023-01-11 NOTE — Progress Notes (Signed)
   Subjective:    Patient ID: Laurie Schaefer, female    DOB: 02-21-1956, 67 y.o.   MRN: [id number]  HPI Here for pain management. Her fibromyalgia has been stable. She does have 2 other issues today. First her migraines have been coming more frequently. Now she is averaging 2-3 a week. She still get relief with Sumatrpitan. Also she has had urinary urgency and burning for the past 2 days. No fever.    Review of Systems  Constitutional: Negative.   Genitourinary:  Positive for dysuria and urgency. Negative for flank pain, frequency and hematuria.  Musculoskeletal:  Positive for myalgias.  Neurological:  Positive for headaches.       Objective:   Physical Exam Constitutional:      Appearance: Normal appearance. She is not ill-appearing.     Comments: Wearing her [place] oxygen   Abdominal:     Tenderness: There is no right CVA tenderness or left CVA tenderness.  Neurological:     General: No focal deficit present.     Mental Status: She is alert and oriented to person, place, and time.           Assessment & Plan:  Pain management.  Indication for chronic opioid: fibromyalgia  Medication and dose: Oxycodone 30 mg # pills per month: 180 Last UDS date: 08-31-22 Opioid Treatment Agreement signed (Y/N): 01-15-20 Opioid Treatment Agreement last reviewed with patient:  01-11-23 [facility] reviewed this encounter (include red flags): Yes Her Oxycodone wad refilled. She also has a UTI, so we will treat this with 7 days of Macrobid. She will try taing Topiramate 50 mg at bedtime for migraine prophylaxis.  Gershon Crane, MD

## 2023-01-12 LAB — URINE CULTURE
MICRO NUMBER:: 15682013
SPECIMEN QUALITY:: ADEQUATE

## 2023-01-27 ENCOUNTER — Encounter: Payer: Self-pay | Admitting: Physician Assistant

## 2023-01-27 ENCOUNTER — Ambulatory Visit: Payer: Medicare HMO | Attending: Physician Assistant | Admitting: Physician Assistant

## 2023-01-27 VITALS — BP 136/98 | HR 96 | Ht 65.0 in | Wt 215.0 lb

## 2023-01-27 DIAGNOSIS — I10 Essential (primary) hypertension: Secondary | ICD-10-CM

## 2023-01-27 DIAGNOSIS — J449 Chronic obstructive pulmonary disease, unspecified: Secondary | ICD-10-CM | POA: Diagnosis not present

## 2023-01-27 DIAGNOSIS — R0609 Other forms of dyspnea: Secondary | ICD-10-CM | POA: Diagnosis not present

## 2023-01-27 DIAGNOSIS — Z79899 Other long term (current) drug therapy: Secondary | ICD-10-CM | POA: Diagnosis not present

## 2023-01-27 DIAGNOSIS — I2699 Other pulmonary embolism without acute cor pulmonale: Secondary | ICD-10-CM

## 2023-01-27 DIAGNOSIS — R0789 Other chest pain: Secondary | ICD-10-CM

## 2023-01-27 DIAGNOSIS — R072 Precordial pain: Secondary | ICD-10-CM

## 2023-01-27 DIAGNOSIS — R002 Palpitations: Secondary | ICD-10-CM

## 2023-01-27 NOTE — Patient Instructions (Signed)
Medication Instructions:  NO CHANGES *If you need a refill on your cardiac medications before your next appointment, please call your pharmacy*   Lab Work: CBC,TSH,BMP TODAY If you have labs (blood work) drawn today and your tests are completely normal, you will receive your results only by: MyChart Message (if you have MyChart) OR A paper copy in the mail If you have any lab test that is abnormal or we need to change your treatment, we will call you to review the results.   Testing/Procedures:[address] Your physician has requested that you have an echocardiogram. Echocardiography is a painless test that uses sound waves to create images of your heart. It provides your doctor with information about the size and shape of your heart and how well your heart's chambers and valves are working. This procedure takes approximately one hour. There are no restrictions for this procedure. Please do NOT wear cologne, perfume, aftershave, or lotions (deodorant is allowed). Please arrive 15 minutes prior to your appointment time.  Please note: We ask at that you not bring children with you during ultrasound (echo/ vascular) testing. Due to room size and safety concerns, children are not allowed in the ultrasound rooms during exams. Our front office staff cannot provide observation of children in our lobby area while testing is being conducted. An adult accompanying a patient to their appointment will only be allowed in the ultrasound room at the discretion of the ultrasound technician under special circumstances. We apologize for any inconvenience.    Follow-Up: At [facility], you and your health needs are our priority.  As part of our continuing mission to provide you with exceptional heart care, we have created designated Provider Care Teams.  These Care Teams include your primary Cardiologist (physician) and Advanced Practice Providers (APPs -  Physician Assistants and Nurse  Practitioners) who all work together to provide you with the care you need, when you need it.  Your next appointment:   FOLLOW UP AS NEEDED  Provider:   Reatha Harps, MD

## 2023-01-27 NOTE — Progress Notes (Unsigned)
Cardiology Office Note:  .   Date:  01/28/2023  ID:  Laurie Schaefer, DOB 1955/10/07, MRN [id number] PCP: Nelwyn Salisbury, MD  [facility] Providers Cardiologist:  Reatha Harps, MD     History of Present Illness: .   Laurie Schaefer is a 67 y.o. female with PMH of COPD on 2L, h/o PE 01/15/2020, fibromyalgia and normal coronary artery on coronary CT 04/14/2022.  The patient was last seen by Dr. Flora Lipps on 01/09/2022 for evaluation of chest pain and shortness of breath.  She gets short of breath with minimal activity.  She has severe COPD and on home oxygen.  She gets daily chest tightness and it occurs with activity and alleviated by rest.  Chest pain improved with narcotic and pain medication.  CT of the chest in 2021 showed 0 calcium score.  EKG shows sinus rhythm no acute changes.  Echocardiogram obtained on 02/04/2022 showed EF 55%, mild asymmetric LVH of basal septal segment, mild MR, no other significant valve issue.  Coronary CT obtained on 04/16/2022 showed 0 calcium score, normal coronary arteries, no acute extracardiac finding.  She was recently seen by Dr. Gershon Crane her PCP on 01/11/2019 for, she was tachycardic with heart rate of 111 at the time.  Urinalysis at the time showed positive protein but negative nitrite.  She was given a prescription for oxycodone for fibromyalgia.  Patient presents today for follow-up.  She was in her usual state of health until roughly 2 days ago, while sitting down reading newspaper, she had a sudden onset of sharp stabbing pain in her central chest associated with shortness of breath.  Symptom improved after she took 4 puffs of her breathing treatment.  She also took some as needed prednisone as well.  In the past 2 weeks, she has taken prednisone about half of the times.  She has not had any recurrence of symptoms since.  I reviewed her previous echocardiogram and coronary CT, I discussed her symptom with Dr. Flora Lipps, her symptom is less likely to  be cardiac in nature.  I would recommend a CBC, basic metabolic panel and a TSH as initial workup.  We will also order echocardiogram, if echocardiogram is normal I would not recommend any further workup.  She has been compliant with Eliquis, suspicion for acute PE fairly low.  ROS:   Patient complains of sudden acute chest pain 2 days ago and the shortness of breath.  She has chronic shortness of breath that occurs with minimal exertion.  She is on 3 L home oxygen 24/7.  Studies Reviewed: .        Cardiac Studies & Procedures       ECHOCARDIOGRAM  ECHOCARDIOGRAM COMPLETE 02/04/2022  Narrative ECHOCARDIOGRAM REPORT    Patient Name:   Laurie Schaefer Date of Exam: 02/04/2022 Medical Rec #:  [id number]           Height:       65.0 in Accession #:    [id number]          Weight:       201.0 lb Date of Birth:  [birth year]          BSA:          1.982 m Patient Age:    65 years            BP:           122/78 mmHg Patient Gender: F  HR:           60 bpm. Exam Location:  [address]  Procedure: 2D Echo, Cardiac Doppler, Color Doppler and Strain Analysis  Indications:    R07.9 Chest Pain  History:        Patient has prior history of Echocardiogram examinations, most recent 08/25/2017. COPD, Signs/Symptoms:Chest Pain and Dyspnea; Risk Factors:Hypertension. Obesity.  Sonographer:    Farrel Conners RDCS Referring Phys: Sande Rives  IMPRESSIONS   1. Left ventricular ejection fraction, by estimation, is 55%. The left ventricle has normal function. The left ventricle has no regional wall motion abnormalities. There is mild asymmetric left ventricular hypertrophy of the basal and septal segments. Left ventricular diastolic parameters were normal. The average left ventricular global longitudinal strain is -22.8 %. The global longitudinal strain is normal. 2. Right ventricular systolic function is normal. The right ventricular size is normal. There is  normal pulmonary artery systolic pressure. 3. Left atrial size was mildly dilated. 4. The mitral valve is abnormal. Mild mitral valve regurgitation. No evidence of mitral stenosis. 5. The aortic valve is tricuspid. Aortic valve regurgitation is not visualized. No aortic stenosis is present. 6. The inferior vena cava is normal in size with greater than 50% respiratory variability, suggesting right atrial pressure of 3 mmHg.  FINDINGS Left Ventricle: Left ventricular ejection fraction, by estimation, is 55%. The left ventricle has normal function. The left ventricle has no regional wall motion abnormalities. The average left ventricular global longitudinal strain is -22.8 %. The global longitudinal strain is normal. The left ventricular internal cavity size was normal in size. There is mild asymmetric left ventricular hypertrophy of the basal and septal segments. Left ventricular diastolic parameters were normal.  Right Ventricle: The right ventricular size is normal. No increase in right ventricular wall thickness. Right ventricular systolic function is normal. There is normal pulmonary artery systolic pressure. The tricuspid regurgitant velocity is 2.85 m/s, and with an assumed right atrial pressure of 3 mmHg, the estimated right ventricular systolic pressure is 35.5 mmHg.  Left Atrium: Left atrial size was mildly dilated.  Right Atrium: Right atrial size was normal in size.  Pericardium: There is no evidence of pericardial effusion.  Mitral Valve: The mitral valve is abnormal. There is mild thickening of the mitral valve leaflet(s). There is mild calcification of the mitral valve leaflet(s). Mild mitral valve regurgitation. No evidence of mitral valve stenosis.  Tricuspid Valve: The tricuspid valve is normal in structure. Tricuspid valve regurgitation is mild . No evidence of tricuspid stenosis.  Aortic Valve: The aortic valve is tricuspid. Aortic valve regurgitation is not visualized. No  aortic stenosis is present.  Pulmonic Valve: The pulmonic valve was normal in structure. Pulmonic valve regurgitation is trivial. No evidence of pulmonic stenosis.  Aorta: The aortic root is normal in size and structure.  Venous: The inferior vena cava is normal in size with greater than 50% respiratory variability, suggesting right atrial pressure of 3 mmHg.  IAS/Shunts: No atrial level shunt detected by color flow Doppler.   LEFT VENTRICLE PLAX 2D LVIDd:         4.10 cm   Diastology LVIDs:         2.40 cm   LV e' medial:    7.40 cm/s LV PW:         0.80 cm   LV E/e' medial:  8.6 LV IVS:        1.20 cm   LV e' lateral:   12.30 cm/s LVOT  diam:     2.30 cm   LV E/e' lateral: 5.2 LV SV:         70 LV SV Index:   35        2D Longitudinal Strain LVOT Area:     4.15 cm  2D Strain GLS (A2C):   -23.7 % 2D Strain GLS (A3C):   -17.5 % 2D Strain GLS (A4C):   -27.4 % 2D Strain GLS Avg:     -22.8 %  RIGHT VENTRICLE RV Basal diam:  3.50 cm RV S prime:     12.50 cm/s TAPSE (M-mode): 1.9 cm  LEFT ATRIUM             Index        RIGHT ATRIUM           Index LA diam:        4.40 cm 2.22 cm/m   RA Area:     20.20 cm LA Vol (A2C):   86.6 ml 43.69 ml/m  RA Volume:   57.50 ml  29.01 ml/m LA Vol (A4C):   84.3 ml 42.53 ml/m LA Biplane Vol: 90.0 ml 45.40 ml/m AORTIC VALVE LVOT Vmax:   71.60 cm/s LVOT Vmean:  44.850 cm/s LVOT VTI:    0.168 m  AORTA Ao Root diam: 3.20 cm Ao Asc diam:  3.40 cm  MITRAL VALVE               TRICUSPID VALVE MV Area (PHT): cm         TR Peak grad:   32.5 mmHg MV Decel Time: 219 msec    TR Vmax:        285.00 cm/s MV E velocity: 63.90 cm/s MV A velocity: 50.80 cm/s  SHUNTS MV E/A ratio:  1.26        Systemic VTI:  0.17 m Systemic Diam: 2.30 cm  Charlton Haws MD Electronically signed by Charlton Haws MD Signature Date/Time: 02/04/2022/5:03:47 PM    Final    MONITORS  LONG TERM MONITOR-LIVE TELEMETRY (3-14 DAYS) 12/16/2020  Narrative Enrollment  12/05/2020-12/11/2020 (6 days 1 hour). Patient had a min HR of 46 bpm (sinus bradycardia), max HR of 158 bpm (supraventricular tachycardia, 12 beat duration, 5.1 second duration), and avg HR of 66 bpm (normal sinus rhythm). Predominant underlying rhythm was Sinus Rhythm. 3 Supraventricular Tachycardia runs occurred, the run with the fastest interval lasting 12 beats (5.1 second duration) with a max rate of 158 bpm, the longest lasting 12 beats (6.3 second duration) with an avg rate of 108 bpm. Isolated SVEs were rare (<1.0%), SVE Couplets were rare (<1.0%), and SVE Triplets were rare (<1.0%). Isolated VEs were rare (<1.0%), VE Couplets were rare (<1.0%), and no VE Triplets were present. Diary summarized below:  12/06/20 08:45pm short of breath, lightheaded, fainted, dizziness coincided with normal sinus rhythm 60 bpm. 12/07/20 09:50am weak, Nausea, short of breath, lightheaded, fainted coincided with normal sinus rhythm 65 bpm. 12/07/20 03:45pm weak, pulse 58, skipped/irregular beat(s), short of breath, lightheaded, dizziness, anxious coincided with normal sinus rhythm 60 bpm. 12/08/20 08:40pm sore in heart/lung area, short of breath, fainted, dizziness coincided with normal sinus rhythm 62 bpm. 12/09/20 01:00pm Fatigue, short of breath coincided with normal sinus rhythm 86 bpm. 12/10/20 01:50pm Fatigue, short of breath coincided with normal sinus rhythm 98 bpm. 12/11/20 01:25pm short of breath, Fatigue, anxious coincided with normal sinus rhythm 84 bpm.  Impression: 1. No significant arrhythmia detected. 2. Rare ectopy.  Gerri Spore T. Flora Lipps, MD, [facility]  [facility] [address] [place], [place] [address] [phone] 10:22 AM   CT SCANS  CT CORONARY MORPH W/CTA COR W/SCORE 04/16/2022  Addendum 04/18/2022  3:14 PM ADDENDUM REPORT: 04/18/2022 15:11  EXAM: OVER-READ INTERPRETATION  CT CHEST  The following report is an over-read performed by radiologist Dr. Elnoria Howard [facility] on 04/18/2022. This over-read does not include interpretation of cardiac or coronary anatomy or pathology. The cardiac interpretation by the cardiologist is attached.  COMPARISON:  Chest CT dated 01/16/2022.  FINDINGS: Stable linear scarring in the posterior, inferior left upper lobe. Otherwise, clear lungs. No pleural fluid.  No enlarged lymph nodes visualized. Small amount of aortic calcification.  Mild thoracic spine degenerative changes.  IMPRESSION: 1. No acute or significant extracardiac findings. 2. Aortic atherosclerosis.  Aortic Atherosclerosis (ICD10-I70.0).   Electronically Signed By: Beckie Salts M.D. On: 04/18/2022 15:11  Narrative CLINICAL DATA:  Chest pain, shortness of breath  EXAM: Cardiac/Coronary  CTA  TECHNIQUE: The patient was scanned on a Siemens Somatom go.Top scanner.  : A retrospective scan was triggered in the ascending thoracic aorta. Axial non-contrast 3 mm slices were carried out through the heart. The data set was analyzed on a dedicated work station and scored using the Agatson method. Gantry rotation speed was 330 msecs and collimation was .6 mm. 100mg  of metoprolol and 0.8 mg of sl NTG was given. The 3D data set was reconstructed in 5% intervals of the 60-95 % of the R-R cycle. Diastolic phases were analyzed on a dedicated work station using MPR, MIP and VRT modes. The patient received 100 cc of contrast.  FINDINGS: Aorta: Normal size. Mild aortic arch and descending aorta calcifications. No dissection.  Aortic Valve:  Trileaflet.  No calcifications.  Coronary Arteries:  Normal coronary origin.  Right dominance.  RCA is a dominant artery.  There is no plaque.  Left main gives rise to LAD and LCX arteries.  LM has no disease.  LAD has no plaque.  LCX is a non-dominant artery. There is no plaque.  Other findings:  Normal pulmonary vein drainage into the left atrium.  Normal left atrial  appendage without a thrombus.  Normal size of the pulmonary artery.  IMPRESSION: 1. Normal coronary calcium score of 0.  Patient is low risk.  2. Normal coronary origin with right dominance.  3. No evidence of CAD.  4. CAD-RADS 0. Consider non-atherosclerotic causes of chest pain or shortness of breath.  Electronically Signed: By: Debbe Odea M.D. On: 04/16/2022 16:05          Risk Assessment/Calculations:            Physical Exam:   VS:  BP (!) 136/98 (BP Location: Left Arm, Patient Position: Sitting, Cuff Size: Normal)   Pulse 96   Ht 5\' 5"  (1.651 m)   Wt 215 lb (97.5 kg)   BMI 35.78 kg/m    Wt Readings from Last 3 Encounters:  01/27/23 215 lb (97.5 kg)  01/11/23 211 lb (95.7 kg)  11/23/22 216 lb (98 kg)    GEN: Well nourished, well developed in no acute distress NECK: No JVD; No carotid bruits CARDIAC: RRR, no murmurs, rubs, gallops RESPIRATORY:  Clear to auscultation without rales, wheezing or rhonchi  ABDOMEN: Soft, non-tender, non-distended EXTREMITIES:  No edema; No deformity   ASSESSMENT AND PLAN: .    Atypical chest pain: Will hold off on further ischemic workup.  Previous coronary CT in February 2024 showed clean  coronary arteries  History of PE: Patient has been compliant with Eliquis.  Suspicion for recurrent PE fairly low  Dyspnea on exertion: Obtain blood work including CBC, basic metabolic panel, TSH and echocardiogram, if normal, would not recommend any further workup  Severe COPD: Followed by pulmonology service.  On 3 L oxygen 24/7.       Dispo: Follow-up with cardiology service as needed if workup negative.  Signed, Azalee Course, PA

## 2023-01-28 LAB — CBC
Hematocrit: 41.3 % (ref 34.0–46.6)
Hemoglobin: 13.2 g/dL (ref 11.1–15.9)
MCH: 28.4 pg (ref 26.6–33.0)
MCHC: 32 g/dL (ref 31.5–35.7)
MCV: 89 fL (ref 79–97)
Platelets: 244 10*3/uL (ref 150–450)
RBC: 4.64 x10E6/uL (ref 3.77–5.28)
RDW: 12.4 % (ref 11.7–15.4)
WBC: 7.1 10*3/uL (ref 3.4–10.8)

## 2023-01-28 LAB — BASIC METABOLIC PANEL
BUN/Creatinine Ratio: 9 — ABNORMAL LOW (ref 12–28)
BUN: 6 mg/dL — ABNORMAL LOW (ref 8–27)
CO2: 23 mmol/L (ref 20–29)
Calcium: 9 mg/dL (ref 8.7–10.3)
Chloride: 106 mmol/L (ref 96–106)
Creatinine, Ser: 0.67 mg/dL (ref 0.57–1.00)
Glucose: 89 mg/dL (ref 70–99)
Potassium: 3.8 mmol/L (ref 3.5–5.2)
Sodium: 142 mmol/L (ref 134–144)
eGFR: 95 mL/min/{1.73_m2} (ref >59)

## 2023-01-28 LAB — TSH: TSH: 0.919 u[IU]/mL (ref 0.450–4.500)

## 2023-02-01 ENCOUNTER — Ambulatory Visit: Payer: Medicare HMO | Admitting: Internal Medicine

## 2023-02-01 ENCOUNTER — Encounter: Payer: Self-pay | Admitting: Internal Medicine

## 2023-02-01 ENCOUNTER — Ambulatory Visit: Payer: Medicare HMO

## 2023-02-01 VITALS — BP 128/81 | HR 114 | Ht 65.0 in | Wt 211.0 lb

## 2023-02-01 DIAGNOSIS — R079 Chest pain, unspecified: Secondary | ICD-10-CM | POA: Diagnosis not present

## 2023-02-01 DIAGNOSIS — R0609 Other forms of dyspnea: Secondary | ICD-10-CM

## 2023-02-01 DIAGNOSIS — J9611 Chronic respiratory failure with hypoxia: Secondary | ICD-10-CM

## 2023-02-01 DIAGNOSIS — I2699 Other pulmonary embolism without acute cor pulmonale: Secondary | ICD-10-CM

## 2023-02-01 DIAGNOSIS — I2729 Other secondary pulmonary hypertension: Secondary | ICD-10-CM

## 2023-02-01 DIAGNOSIS — J439 Emphysema, unspecified: Secondary | ICD-10-CM

## 2023-02-01 DIAGNOSIS — R0683 Snoring: Secondary | ICD-10-CM

## 2023-02-01 NOTE — Progress Notes (Unsigned)
02/01/23- 66 yoF never smoker  followed here by Dr Jeanelle Malling, NP, with hx COPD/ Asthma overlap, hx PE/Eliquis, Chronic Respiratory Failure with Hypoxia, Allergic Rhinitis, Vocal Cord Dysfunction, Obesity, Insomnia, Fibromyalgia/ Chronic Narcotic Use, Depression, Bipolar,  - Ventolin hfa, Symbicort 160, prednisone 10 mg daily, Spiriva 2.5, oxycodone 30 mg, O2 2L , 3L exertion, POC Seen by cardiology 11/20 for recent onset sharp stabbing chest pain- not cardiac. Acute Work In today for " BP high, terrible pains in lungs, pulmonary hypertension" Arrival O2 sat today on 3L 96%. ECHO 2023- no pulmonary hypertension She looked up symptoms on Web MD "everything said pulmonary hypertension". Seen on 11/20  by Cardiology> labs normal. Main c/o "weakness" X 3 months. Used her rescue inhaler after mowing yard. Pains "all over", sharp/shooting,- narrowed specifically to tenderness deep to R parasternal area today, but moves around. Tender to firm pressure. Hot and cold feelings x 6 months. Some dry cough. Asks refill inhalers- sporadic use. Says compliant with Eliquis. CT Coronary Morph 04/18/22 IMPRESSION: 1. No acute or significant extracardiac findings. 2. Aortic atherosclerosis. Aortic Atherosclerosis (ICD10-I70.0  Prior to Admission medications   Medication Sig Start Date End Date Taking? Authorizing Provider  albuterol (VENTOLIN HFA) 108 (90 Base) MCG/ACT inhaler INHALE 2 PUFFS INTO THE LUNGS EVERY 6 HOURS AS NEEDED FOR WHEEZING OR SHORTNESS OF BREATH 05/30/19  Yes Parrett, Tammy S, NP  atorvastatin (LIPITOR) 10 MG tablet Take 1 tablet (10 mg total) by mouth daily. 01/07/23  Yes Nelwyn Salisbury, MD  budesonide-formoterol [facility]) 160-4.5 MCG/ACT inhaler Inhale 2 puffs into the lungs in the morning and at bedtime. 03/19/21  Yes Bevelyn Ngo, NP  clotrimazole-betamethasone (LOTRISONE) cream Apply 1 Application topically 2 (two) times daily. 05/18/22  Yes Nelwyn Salisbury, MD  DULoxetine (CYMBALTA)  30 MG capsule Take 1 capsule (30 mg total) by mouth daily. 11/23/22  Yes Nelwyn Salisbury, MD  ELIQUIS 5 MG TABS tablet TAKE 1 TABLET BY MOUTH TWICE A DAY 02/19/22  Yes Nelwyn Salisbury, MD  ketoconazole (NIZORAL) 2 % cream To apply 2 times daily to yeast rash 05/19/22  Yes Nelwyn Salisbury, MD  ketoconazole (NIZORAL) 2 % cream Apply 1 application twice a day as needed for yeast rashes 05/19/22  Yes Nelwyn Salisbury, MD  LORazepam (ATIVAN) 2 MG tablet TAKE 1 TABLET BY MOUTH UP TO EVERY 6 HOURS AS NEEDED FOR ANXIETY 11/05/22  Yes Nelwyn Salisbury, MD  losartan-hydrochlorothiazide (HYZAAR) 100-25 MG tablet Take 0.5 tablets by mouth daily. 01/09/22  Yes O'Neal, Ronnald Ramp, MD  methocarbamol (ROBAXIN) 500 MG tablet Take 1 tablet (500 mg total) by mouth every 8 (eight) hours as needed for muscle spasms. 05/18/22  Yes Nelwyn Salisbury, MD  nitrofurantoin, macrocrystal-monohydrate, (MACROBID) 100 MG capsule Take 1 capsule (100 mg total) by mouth 2 (two) times daily. 01/11/23  Yes Nelwyn Salisbury, MD  ondansetron (ZOFRAN) 8 MG tablet Take 1 tablet (8 mg total) by mouth every 6 (six) hours as needed for nausea or vomiting. 07/29/21  Yes Nelwyn Salisbury, MD  oxycodone (ROXICODONE) 30 MG immediate release tablet Take 1 tablet (30 mg total) by mouth every 4 (four) hours as needed for pain. 01/11/23  Yes Nelwyn Salisbury, MD  OXYGEN Place 3 L into the nose continuous.   Yes [provider]  pantoprazole (PROTONIX) 40 MG tablet TAKE 1 TABLET BY MOUTH EVERY DAY 10/08/22  Yes Nelwyn Salisbury, MD  polyethylene glycol powder (GLYCOLAX/MIRALAX) 17 GM/SCOOP powder Take  17 g by mouth 2 (two) times daily as needed. 10/06/21  Yes Nelwyn Salisbury, MD  potassium chloride (KLOR-CON) 10 MEQ tablet Take 1 tablet (10 mEq total) by mouth daily. 10/17/21  Yes Nelwyn Salisbury, MD  prazosin (MINIPRESS) 2 MG capsule Take 1 capsule (2 mg total) by mouth at bedtime. Patient taking differently: Take 2 mg by mouth at bedtime as needed (sleep). 05/15/20  Yes  Nelwyn Salisbury, MD  predniSONE (DELTASONE) 10 MG tablet Take 1 tablet (10 mg total) by mouth daily as needed (shortness of breath). 01/07/23  Yes Nelwyn Salisbury, MD  pregabalin (LYRICA) 50 MG capsule Take one capsule in the morning and one in the afternoon 01/07/23  Yes Nelwyn Salisbury, MD  Psyllium 25 % POWD Take 1-2 teaspoons once daily with first meal of the day. 04/22/21  Yes Imogene Burn, MD  SUMAtriptan (IMITREX) 100 MG tablet TAKE 1 TABLET BY MOUTH AS NEEDED FOR MIGRAINE. MAY REPEAT IN 2 HOURS IF HEADACHE PERSISTS OR RECURS. 01/04/23  Yes Nelwyn Salisbury, MD  temazepam (RESTORIL) 30 MG capsule Take 1 capsule (30 mg total) by mouth at bedtime as needed for sleep. 11/25/22  Yes Nelwyn Salisbury, MD  Tiotropium Bromide Monohydrate (SPIRIVA RESPIMAT) 2.5 MCG/ACT AERS Inhale 2 puffs into the lungs daily. 03/19/21  Yes Bevelyn Ngo, NP  topiramate (TOPAMAX) 50 MG tablet Take 1 tablet (50 mg total) by mouth at bedtime. 01/11/23  Yes Nelwyn Salisbury, MD  oxycodone (ROXICODONE) 30 MG immediate release tablet Take 1 tablet (30 mg total) by mouth every 4 (four) hours as needed for pain. Patient not taking: Reported on 02/01/2023 01/11/23   Nelwyn Salisbury, MD  oxycodone (ROXICODONE) 30 MG immediate release tablet Take 1 tablet (30 mg total) by mouth every 4 (four) hours as needed for pain. Patient not taking: Reported on 02/01/2023 01/11/23   Nelwyn Salisbury, MD   Past Medical History:  Diagnosis Date   Arthritis    Bipolar disorder [facility])    Chronic neck pain    had previously seen Dr. Salli Real at [facility]    COPD (chronic obstructive pulmonary disease) [facility])    sees Dr. Delton Coombes    Depression    Fibromyalgia    Headache    Hypertension    Past Surgical History:  Procedure Laterality Date   COLONOSCOPY  08/29/2021   per Dr. Carney Harder in [facility]   FRACTURE SURGERY     Family History  Problem Relation Age of Onset   Depression Mother    Hypertension Mother    Stroke Father     Hypertension Father    Social History   Socioeconomic History   Marital status: Single    Spouse name: Not on file   Number of children: 0   Years of education: Not on file   Highest education level: Not on file  Occupational History   Not on file  Tobacco Use   Smoking status: Never    Passive exposure: Never   Smokeless tobacco: Never  Vaping Use   Vaping status: Never Used  Substance and Sexual Activity   Alcohol use: No    Alcohol/week: 0.0 standard drinks of alcohol   Drug use: No   Sexual activity: Not Currently  Other Topics Concern   Not on file  Social History Narrative   Divorced, HH 1   No children   States she's been to graduate school  Social Determinants of Health   Financial Resource Strain: Low Risk  (02/16/2022)   Overall Financial Resource Strain (CARDIA)    Difficulty of Paying Living Expenses: Not hard at all  Food Insecurity: No Food Insecurity (02/26/2022)   Hunger Vital Sign    Worried About Running Out of Food in the Last Year: Never true    Ran Out of Food in the Last Year: Never true  Transportation Needs: No Transportation Needs (02/26/2022)   PRAPARE - Administrator, Civil Service (Medical): No    Lack of Transportation (Non-Medical): No  Physical Activity: Insufficiently Active (02/16/2022)   Exercise Vital Sign    Days of Exercise per Week: 2 days    Minutes of Exercise per Session: 30 min  Stress: No Stress Concern Present (02/16/2022)   [employer] of Occupational Health - Occupational Stress Questionnaire    Feeling of Stress : Not at all  Social Connections: Moderately Integrated (02/16/2022)   Social Connection and Isolation Panel [NHANES]    Frequency of Communication with Friends and Family: More than three times a week    Frequency of Social Gatherings with Friends and Family: More than three times a week    Attends Religious Services: More than 4 times per year    Active Member of [employer] or Organizations:  Yes    Attends Engineer, structural: More than 4 times per year    Marital Status: Divorced  Intimate Partner Violence: Not At Risk (02/26/2022)   Humiliation, Afraid, Rape, and Kick questionnaire    Fear of Current or Ex-Partner: No    Emotionally Abused: No    Physically Abused: No    Sexually Abused: No   ROS-see HPI   + = positive Constitutional:    weight loss, night sweats, fevers, chills, fatigue, lassitude. HEENT:    headaches, difficulty swallowing, tooth/dental problems, sore throat,       sneezing, itching, ear ache, nasal congestion, post nasal drip, snoring CV:    +chest pain, orthopnea, PND, swelling in lower extremities, anasarca,       dizziness, +palpitations Resp:   shortness of breath with exertion or at rest.                productive cough,   non-productive cough, coughing up of blood.              change in color of mucus.  wheezing.   Skin:    rash or lesions. GI:  No-   heartburn, indigestion, abdominal pain, nausea, vomiting, diarrhea,                 change in bowel habits, loss of appetite GU: dysuria, change in color of urine, no urgency or frequency.   flank pain. MS:   joint pain, stiffness, decreased range of motion, back pain. Neuro-     nothing unusual Psych:  change in mood or affect.  depression or anxiety.   memory loss.  OBJ- Physical Exam     +POC 3 L O2 General- Alert, Oriented, Affect-appropriate, Distress- none acute, + obese, Skin- rash-none, lesions- none, excoriation- none Lymphadenopathy- none Head- atraumatic            Eyes- Gross vision intact, PERRLA, conjunctivae and secretions clear            Ears- Hearing, canals-normal            Nose- Clear, no-Septal dev, mucus, polyps, erosion, perforation  Throat- Mallampati IV , mucosa clear , drainage- none, tonsils- atrophic Neck- flexible , trachea midline, no stridor , thyroid nl, carotid no bruit Chest - symmetrical excursion , unlabored           Heart/CV- RRR ,  no murmur , no gallop  , no rub, nl s1 s2                           - JVD- none , edema- none, stasis changes- none, varices- none           Lung- clear to P&A, wheeze- none, cough- none , dullness-none, rub- none           Chest wall-  Abd-  Br/ Gen/ Rectal- Not done, not indicated Extrem- cyanosis- none, clubbing, none, atrophy- none, strength- nl Neuro- grossly intact to observation

## 2023-02-01 NOTE — Patient Instructions (Addendum)
Order- CXR    dx COPD exacerbation  Order- lab- D-dimer, BNP     dx dyspnea on exertion  Order- ECHOcardiogram    dx question pulmonary hypertenison  Refills sent for albuterol rescue inhaler, Symbicort and Spiriva  Order- schedule split night sleep study    dx obstructive sleep apnea

## 2023-02-02 ENCOUNTER — Ambulatory Visit: Payer: Medicare HMO

## 2023-02-02 ENCOUNTER — Other Ambulatory Visit: Payer: Medicare HMO

## 2023-02-02 ENCOUNTER — Other Ambulatory Visit: Payer: Self-pay | Admitting: Internal Medicine

## 2023-02-02 DIAGNOSIS — R0609 Other forms of dyspnea: Secondary | ICD-10-CM | POA: Diagnosis not present

## 2023-02-02 DIAGNOSIS — R0989 Other specified symptoms and signs involving the circulatory and respiratory systems: Secondary | ICD-10-CM | POA: Diagnosis not present

## 2023-02-02 DIAGNOSIS — R0600 Dyspnea, unspecified: Secondary | ICD-10-CM | POA: Diagnosis not present

## 2023-02-02 LAB — D-DIMER, QUANTITATIVE: D-Dimer, Quant: 0.52 ug{FEU}/mL — ABNORMAL HIGH (ref <0.50)

## 2023-02-03 ENCOUNTER — Encounter: Payer: Self-pay | Admitting: Internal Medicine

## 2023-02-03 DIAGNOSIS — R079 Chest pain, unspecified: Secondary | ICD-10-CM | POA: Insufficient documentation

## 2023-02-03 DIAGNOSIS — R0683 Snoring: Secondary | ICD-10-CM | POA: Insufficient documentation

## 2023-02-03 NOTE — Assessment & Plan Note (Signed)
Never smoker, but carries this dx.  Plan- CXR, refill inhalers

## 2023-02-03 NOTE — Assessment & Plan Note (Signed)
Remains compliant with Eliquis. Doubt new PE but will check D-dimer

## 2023-02-03 NOTE — Progress Notes (Signed)
02/01/23- 66 yoF never smoker  followed here by Dr Jeanelle Malling, NP, with hx COPD/ Asthma overlap, hx PE/Eliquis, Chronic Respiratory Failure with Hypoxia, Allergic Rhinitis, Vocal Cord Dysfunction, Obesity, Insomnia, Fibromyalgia/ Chronic Narcotic Use, Depression, Bipolar,  - Ventolin hfa, Symbicort 160, prednisone 10 mg daily, Spiriva 2.5, oxycodone 30 mg, O2 2L , 3L exertion, POC Seen by cardiology 11/20 for recent onset sharp stabbing chest pain- not cardiac. Acute Work In today for " BP high, terrible pains in lungs, pulmonary hypertension" Arrival O2 sat today on 3L 96%. ECHO 2023- no pulmonary hypertension She looked up symptoms on Web MD "everything said pulmonary hypertension". Seen on 11/20  by Cardiology> labs normal. Main c/o "weakness" X 3 months. Used her rescue inhaler after mowing yard. Pains "all over", sharp/shooting,- narrowed specifically to tenderness deep to R parasternal area today, but moves around. Tender to firm pressure. Hot and cold feelings x 6 months. Some dry cough. Asks refill inhalers- sporadic use. Says compliant with Eliquis. CT Coronary Morph 04/18/22 IMPRESSION: 1. No acute or significant extracardiac findings. 2. Aortic atherosclerosis. Aortic Atherosclerosis (ICD10-I70.0  Prior to Admission medications   Medication Sig Start Date End Date Taking? Authorizing Provider  albuterol (VENTOLIN HFA) 108 (90 Base) MCG/ACT inhaler INHALE 2 PUFFS INTO THE LUNGS EVERY 6 HOURS AS NEEDED FOR WHEEZING OR SHORTNESS OF BREATH 05/30/19  Yes Parrett, Tammy S, NP  atorvastatin (LIPITOR) 10 MG tablet Take 1 tablet (10 mg total) by mouth daily. 01/07/23  Yes Nelwyn Salisbury, MD  budesonide-formoterol [facility]) 160-4.5 MCG/ACT inhaler Inhale 2 puffs into the lungs in the morning and at bedtime. 03/19/21  Yes Bevelyn Ngo, NP  clotrimazole-betamethasone (LOTRISONE) cream Apply 1 Application topically 2 (two) times daily. 05/18/22  Yes Nelwyn Salisbury, MD  DULoxetine (CYMBALTA)  30 MG capsule Take 1 capsule (30 mg total) by mouth daily. 11/23/22  Yes Nelwyn Salisbury, MD  ELIQUIS 5 MG TABS tablet TAKE 1 TABLET BY MOUTH TWICE A DAY 02/19/22  Yes Nelwyn Salisbury, MD  ketoconazole (NIZORAL) 2 % cream To apply 2 times daily to yeast rash 05/19/22  Yes Nelwyn Salisbury, MD  ketoconazole (NIZORAL) 2 % cream Apply 1 application twice a day as needed for yeast rashes 05/19/22  Yes Nelwyn Salisbury, MD  LORazepam (ATIVAN) 2 MG tablet TAKE 1 TABLET BY MOUTH UP TO EVERY 6 HOURS AS NEEDED FOR ANXIETY 11/05/22  Yes Nelwyn Salisbury, MD  losartan-hydrochlorothiazide (HYZAAR) 100-25 MG tablet Take 0.5 tablets by mouth daily. 01/09/22  Yes O'Neal, Ronnald Ramp, MD  methocarbamol (ROBAXIN) 500 MG tablet Take 1 tablet (500 mg total) by mouth every 8 (eight) hours as needed for muscle spasms. 05/18/22  Yes Nelwyn Salisbury, MD  nitrofurantoin, macrocrystal-monohydrate, (MACROBID) 100 MG capsule Take 1 capsule (100 mg total) by mouth 2 (two) times daily. 01/11/23  Yes Nelwyn Salisbury, MD  ondansetron (ZOFRAN) 8 MG tablet Take 1 tablet (8 mg total) by mouth every 6 (six) hours as needed for nausea or vomiting. 07/29/21  Yes Nelwyn Salisbury, MD  oxycodone (ROXICODONE) 30 MG immediate release tablet Take 1 tablet (30 mg total) by mouth every 4 (four) hours as needed for pain. 01/11/23  Yes Nelwyn Salisbury, MD  OXYGEN Place 3 L into the nose continuous.   Yes [provider]  pantoprazole (PROTONIX) 40 MG tablet TAKE 1 TABLET BY MOUTH EVERY DAY 10/08/22  Yes Nelwyn Salisbury, MD  polyethylene glycol powder (GLYCOLAX/MIRALAX) 17 GM/SCOOP powder Take  17 g by mouth 2 (two) times daily as needed. 10/06/21  Yes Nelwyn Salisbury, MD  potassium chloride (KLOR-CON) 10 MEQ tablet Take 1 tablet (10 mEq total) by mouth daily. 10/17/21  Yes Nelwyn Salisbury, MD  prazosin (MINIPRESS) 2 MG capsule Take 1 capsule (2 mg total) by mouth at bedtime. Patient taking differently: Take 2 mg by mouth at bedtime as needed (sleep). 05/15/20  Yes  Nelwyn Salisbury, MD  predniSONE (DELTASONE) 10 MG tablet Take 1 tablet (10 mg total) by mouth daily as needed (shortness of breath). 01/07/23  Yes Nelwyn Salisbury, MD  pregabalin (LYRICA) 50 MG capsule Take one capsule in the morning and one in the afternoon 01/07/23  Yes Nelwyn Salisbury, MD  Psyllium 25 % POWD Take 1-2 teaspoons once daily with first meal of the day. 04/22/21  Yes Imogene Burn, MD  SUMAtriptan (IMITREX) 100 MG tablet TAKE 1 TABLET BY MOUTH AS NEEDED FOR MIGRAINE. MAY REPEAT IN 2 HOURS IF HEADACHE PERSISTS OR RECURS. 01/04/23  Yes Nelwyn Salisbury, MD  temazepam (RESTORIL) 30 MG capsule Take 1 capsule (30 mg total) by mouth at bedtime as needed for sleep. 11/25/22  Yes Nelwyn Salisbury, MD  Tiotropium Bromide Monohydrate (SPIRIVA RESPIMAT) 2.5 MCG/ACT AERS Inhale 2 puffs into the lungs daily. 03/19/21  Yes Bevelyn Ngo, NP  topiramate (TOPAMAX) 50 MG tablet Take 1 tablet (50 mg total) by mouth at bedtime. 01/11/23  Yes Nelwyn Salisbury, MD  oxycodone (ROXICODONE) 30 MG immediate release tablet Take 1 tablet (30 mg total) by mouth every 4 (four) hours as needed for pain. Patient not taking: Reported on 02/01/2023 01/11/23   Nelwyn Salisbury, MD  oxycodone (ROXICODONE) 30 MG immediate release tablet Take 1 tablet (30 mg total) by mouth every 4 (four) hours as needed for pain. Patient not taking: Reported on 02/01/2023 01/11/23   Nelwyn Salisbury, MD   Past Medical History:  Diagnosis Date   Arthritis    Bipolar disorder [facility])    Chronic neck pain    had previously seen Dr. Salli Real at [facility]    COPD (chronic obstructive pulmonary disease) [facility])    sees Dr. Delton Coombes    Depression    Fibromyalgia    Headache    Hypertension    Past Surgical History:  Procedure Laterality Date   COLONOSCOPY  08/29/2021   per Dr. Carney Harder in [facility]   FRACTURE SURGERY     Family History  Problem Relation Age of Onset   Depression Mother    Hypertension Mother    Stroke Father     Hypertension Father    Social History   Socioeconomic History   Marital status: Single    Spouse name: Not on file   Number of children: 0   Years of education: Not on file   Highest education level: Not on file  Occupational History   Not on file  Tobacco Use   Smoking status: Never    Passive exposure: Never   Smokeless tobacco: Never  Vaping Use   Vaping status: Never Used  Substance and Sexual Activity   Alcohol use: No    Alcohol/week: 0.0 standard drinks of alcohol   Drug use: No   Sexual activity: Not Currently  Other Topics Concern   Not on file  Social History Narrative   Divorced, HH 1   No children   States she's been to graduate school  Social Determinants of Health   Financial Resource Strain: Low Risk  (02/16/2022)   Overall Financial Resource Strain (CARDIA)    Difficulty of Paying Living Expenses: Not hard at all  Food Insecurity: No Food Insecurity (02/26/2022)   Hunger Vital Sign    Worried About Running Out of Food in the Last Year: Never true    Ran Out of Food in the Last Year: Never true  Transportation Needs: No Transportation Needs (02/26/2022)   PRAPARE - Administrator, Civil Service (Medical): No    Lack of Transportation (Non-Medical): No  Physical Activity: Insufficiently Active (02/16/2022)   Exercise Vital Sign    Days of Exercise per Week: 2 days    Minutes of Exercise per Session: 30 min  Stress: No Stress Concern Present (02/16/2022)   [employer] of Occupational Health - Occupational Stress Questionnaire    Feeling of Stress : Not at all  Social Connections: Moderately Integrated (02/16/2022)   Social Connection and Isolation Panel [NHANES]    Frequency of Communication with Friends and Family: More than three times a week    Frequency of Social Gatherings with Friends and Family: More than three times a week    Attends Religious Services: More than 4 times per year    Active Member of [employer] or Organizations:  Yes    Attends Engineer, structural: More than 4 times per year    Marital Status: Divorced  Intimate Partner Violence: Not At Risk (02/26/2022)   Humiliation, Afraid, Rape, and Kick questionnaire    Fear of Current or Ex-Partner: No    Emotionally Abused: No    Physically Abused: No    Sexually Abused: No   ROS-see HPI   + = positive Constitutional:    weight loss, night sweats, fevers, chills, fatigue, lassitude. HEENT:    headaches, difficulty swallowing, tooth/dental problems, sore throat,       sneezing, itching, ear ache, nasal congestion, post nasal drip, snoring CV:    +chest pain, orthopnea, PND, swelling in lower extremities, anasarca,       dizziness, +palpitations Resp:   shortness of breath with exertion or at rest.                productive cough,   non-productive cough, coughing up of blood.              change in color of mucus.  wheezing.   Skin:    rash or lesions. GI:  No-   heartburn, indigestion, abdominal pain, nausea, vomiting, diarrhea,                 change in bowel habits, loss of appetite GU: dysuria, change in color of urine, no urgency or frequency.   flank pain. MS:   joint pain, stiffness, decreased range of motion, back pain. Neuro-     nothing unusual Psych:  change in mood or affect.  depression or anxiety.   memory loss.  OBJ- Physical Exam     +POC 3 L O2 General- Alert, Oriented, Affect-appropriate, Distress- none acute, + obese, Skin- rash-none, lesions- none, excoriation- none Lymphadenopathy- none Head- atraumatic            Eyes- Gross vision intact, PERRLA, conjunctivae and secretions clear            Ears- Hearing, canals-normal            Nose- Clear, no-Septal dev, mucus, polyps, erosion, perforation  Throat- Mallampati IV , mucosa clear , drainage- none, tonsils- atrophic Neck- flexible , trachea midline, no stridor , thyroid nl, carotid no bruit Chest - symmetrical excursion , unlabored           Heart/CV- RRR ,  no murmur , no gallop  , no rub, nl s1 s2                           - JVD- none , edema- none, stasis changes- none, varices- none           Lung- clear to P&A, wheeze- none, cough- none , dullness-none, rub- none           Chest wall-  Abd-  Br/ Gen/ Rectal- Not done, not indicated Extrem- cyanosis- none, clubbing, none, atrophy- none, strength- nl Neuro- grossly intact to observation

## 2023-02-03 NOTE — Assessment & Plan Note (Signed)
Exam and history suggest OSA, Because she is on O2, will schedule in-center overnight sleep study.

## 2023-02-03 NOTE — Assessment & Plan Note (Signed)
Atypical and her description tended to change during discussion. 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 Plan- CXR, heating pad, tylenol

## 2023-02-03 NOTE — Assessment & Plan Note (Signed)
Feels she needs to stay on 3L all the time now.

## 2023-02-11 ENCOUNTER — Telehealth: Payer: Self-pay | Admitting: Cardiovascular Disease

## 2023-02-11 NOTE — Telephone Encounter (Signed)
 Attempted to call patient, no answer left message requesting a call back.

## 2023-02-11 NOTE — Telephone Encounter (Signed)
 Attempted to call patient x2, no answer left message requesting a call back.

## 2023-02-11 NOTE — Telephone Encounter (Signed)
Pt returning call

## 2023-02-11 NOTE — Telephone Encounter (Signed)
  Per MyChart scheduling message:  Patient would like someone to explain outcome of tests in lay terms to her

## 2023-02-11 NOTE — Telephone Encounter (Signed)
[place], [place] 01/29/2023  9:47 AM EST     Stable renal function and electrolyte. Normal white blood cell count and red blood cell count. Normal thyroid level.   Patient identification verified by 2 forms. Laurie Rail, RN    Received call from patient  Informed patient, per mychart message recent results normal  Patient verbalized understanding, no questions at this time

## 2023-02-27 ENCOUNTER — Other Ambulatory Visit: Payer: Self-pay | Admitting: Family Medicine

## 2023-03-09 ENCOUNTER — Ambulatory Visit (HOSPITAL_COMMUNITY): Payer: Medicare HMO

## 2023-03-10 ENCOUNTER — Encounter: Payer: Self-pay | Admitting: Family Medicine

## 2023-03-12 NOTE — Telephone Encounter (Signed)
 FYI  Pt has appointment scheduled for 03/16/23

## 2023-03-16 ENCOUNTER — Ambulatory Visit: Payer: Medicare HMO | Admitting: Family Medicine

## 2023-03-17 ENCOUNTER — Ambulatory Visit (INDEPENDENT_AMBULATORY_CARE_PROVIDER_SITE_OTHER): Payer: Medicare HMO | Admitting: Family Medicine

## 2023-03-17 ENCOUNTER — Encounter: Payer: Self-pay | Admitting: Family Medicine

## 2023-03-17 VITALS — BP 120/82 | HR 88 | Temp 98.2°F | Wt 210.0 lb

## 2023-03-17 DIAGNOSIS — G4700 Insomnia, unspecified: Secondary | ICD-10-CM | POA: Diagnosis not present

## 2023-03-17 DIAGNOSIS — F319 Bipolar disorder, unspecified: Secondary | ICD-10-CM

## 2023-03-17 DIAGNOSIS — F4321 Adjustment disorder with depressed mood: Secondary | ICD-10-CM | POA: Diagnosis not present

## 2023-03-17 MED ORDER — BUPROPION HCL ER (XL) 150 MG PO TB24: 150.0000 mg | ORAL_TABLET | Freq: Every day | ORAL | Status: DC

## 2023-03-17 MED ORDER — QUETIAPINE FUMARATE 25 MG PO TABS: 25.0000 mg | ORAL_TABLET | Freq: Every day | ORAL | 5 refills | Status: DC

## 2023-03-17 NOTE — Progress Notes (Signed)
   Subjective:    Patient ID: Laurie Schaefer, female    DOB: 08-16-1955, 68 y.o.   MRN: [id number]  HPI Here asking for help with grieving the loss of her dog. She and her husband had to put their dog down about 2 weeks ago (after having him for 16 years). Since then she has been upset and tearful, and getting sleep has been difficult. Of note she stopped taking Cymbalta  about 4 weeks ago because it was giving her headaches. She continues to take Temazepam  at bedtime.    Review of Systems  Constitutional: Negative.   Respiratory:  Positive for shortness of breath.   Cardiovascular: Negative.   Psychiatric/Behavioral:  Positive for dysphoric mood and sleep disturbance. Negative for agitation, behavioral problems, confusion, decreased concentration, hallucinations, self-injury and suicidal ideas. The patient is nervous/anxious.        Objective:   Physical Exam Constitutional:      Appearance: Normal appearance.  Cardiovascular:     Rate and Rhythm: Normal rate and regular rhythm.     Pulses: Normal pulses.     Heart sounds: Normal heart sounds.  Pulmonary:     Effort: Pulmonary effort is normal.     Breath sounds: Normal breath sounds.  Neurological:     General: No focal deficit present.     Mental Status: She is alert and oriented to person, place, and time.  Psychiatric:        Behavior: Behavior normal.        Thought Content: Thought content normal.     Comments: Tearful            Assessment & Plan:  She has underlying bipolar depression, and since she stopped Cymbalta , we will start her back on Wellbutrin  XL 150 mg daily. She is also going through the grief process, and this is affecting her sleep. She will stop the Temazepam ,  and instead she will take Seroquel  25 mg at bedtime. She will follow up in 3-4 weeks.  Garnette Olmsted, MD

## 2023-03-30 ENCOUNTER — Encounter: Payer: Self-pay | Admitting: Family Medicine

## 2023-03-30 ENCOUNTER — Ambulatory Visit (INDEPENDENT_AMBULATORY_CARE_PROVIDER_SITE_OTHER): Payer: Medicare HMO | Admitting: Family Medicine

## 2023-03-30 VITALS — BP 120/82 | HR 106 | Temp 98.1°F | Wt 208.0 lb

## 2023-03-30 DIAGNOSIS — J209 Acute bronchitis, unspecified: Secondary | ICD-10-CM | POA: Diagnosis not present

## 2023-03-30 DIAGNOSIS — J029 Acute pharyngitis, unspecified: Secondary | ICD-10-CM | POA: Diagnosis not present

## 2023-03-30 DIAGNOSIS — J440 Chronic obstructive pulmonary disease with acute lower respiratory infection: Secondary | ICD-10-CM

## 2023-03-30 DIAGNOSIS — R0989 Other specified symptoms and signs involving the circulatory and respiratory systems: Secondary | ICD-10-CM

## 2023-03-30 LAB — POC COVID19 BINAXNOW: SARS Coronavirus 2 Ag: NEGATIVE

## 2023-03-30 LAB — POCT INFLUENZA A/B
Influenza A, POC: NEGATIVE
Influenza B, POC: NEGATIVE

## 2023-03-30 MED ORDER — ONDANSETRON HCL 8 MG PO TABS: 8.0000 mg | ORAL_TABLET | Freq: Four times a day (QID) | ORAL | 5 refills | Status: DC | PRN

## 2023-03-30 MED ORDER — METHYLPREDNISOLONE ACETATE 40 MG/ML IJ SUSP
40.0000 mg | Freq: Once | INTRAMUSCULAR | Status: AC
  Administered 2023-03-30: 40 mg via INTRAMUSCULAR

## 2023-03-30 MED ORDER — AMOXICILLIN-POT CLAVULANATE 875-125 MG PO TABS: 1.0000 | ORAL_TABLET | Freq: Two times a day (BID) | ORAL | 0 refills | Status: DC

## 2023-03-30 MED ORDER — METHYLPREDNISOLONE ACETATE 80 MG/ML IJ SUSP
80.0000 mg | Freq: Once | INTRAMUSCULAR | Status: AC
  Administered 2023-03-30: 80 mg via INTRAMUSCULAR

## 2023-03-30 NOTE — Progress Notes (Signed)
   Subjective:    Patient ID: Laurie Schaefer, female    DOB: 10-Jul-1955, 68 y.o.   MRN: [id number]  HPI Here for 5 days of chest congestion, wheezing, and coughing up green sputum. No fever. Using her nebulizer.    Review of Systems  Constitutional: Negative.   HENT:  Positive for sore throat. Negative for congestion, ear pain, postnasal drip and sinus pain.   Eyes: Negative.   Respiratory:  Positive for cough, chest tightness and wheezing.        Objective:   Physical Exam Constitutional:      General: She is not in acute distress.    Comments: Wearing her [employer] oxygen   HENT:     Right Ear: Tympanic membrane, ear canal and external ear normal.     Left Ear: Tympanic membrane, ear canal and external ear normal.     Nose: Nose normal.     Mouth/Throat:     Pharynx: Oropharynx is clear.  Eyes:     Conjunctiva/sclera: Conjunctivae normal.  Pulmonary:     Breath sounds: Wheezing and rhonchi present. No rales.  Lymphadenopathy:     Cervical: No cervical adenopathy.  Neurological:     Mental Status: She is alert.           Assessment & Plan:  Bronchitis, treat with 10 days of Augmentin and shot of DepoMedrol.  Gershon Crane, MD

## 2023-03-30 NOTE — Addendum Note (Signed)
Addended by: Carola Rhine on: 03/30/2023 04:22 PM   Modules accepted: Orders

## 2023-03-30 NOTE — Addendum Note (Signed)
Addended by: Carola Rhine on: 03/30/2023 03:57 PM   Modules accepted: Orders

## 2023-04-10 ENCOUNTER — Other Ambulatory Visit: Payer: Self-pay | Admitting: Family Medicine

## 2023-04-12 ENCOUNTER — Other Ambulatory Visit: Payer: Self-pay | Admitting: Family Medicine

## 2023-04-18 ENCOUNTER — Encounter: Payer: Self-pay | Admitting: Family Medicine

## 2023-04-19 ENCOUNTER — Encounter: Payer: Self-pay | Admitting: Family Medicine

## 2023-04-22 ENCOUNTER — Telehealth: Payer: Self-pay | Admitting: *Deleted

## 2023-04-22 NOTE — Telephone Encounter (Signed)
Patient is calling back in regarding her medication she would like a call back today

## 2023-04-22 NOTE — Telephone Encounter (Signed)
Copied from CRM [id number]. Topic: Clinical - Prescription Issue >> Apr 22, 2023  9:58 AM Irine Seal wrote: Reason for CRM: Oxycodone HCL IR 30 MG out of medication. She sent messages via mychart informed her that it was routed to the appropriate  channels and would get a response as soon as possible,   but She stated her pharmacy needs the provider to contact [employer] in regards to getting a refill, offered to schedule an in office visit but she declined stating she was hesitant to come in due to the weather.   Patient is irate, not sleeping and in pain,  stating that it is a hassle to get her pain medication refilled, she wants a callback to discuss if she can get a telephone appt since her phone wont allow her to do virtual visits or if the refill can be approved without a visit - patient  callback [phone]

## 2023-04-27 ENCOUNTER — Telehealth: Payer: Self-pay

## 2023-04-27 NOTE — Telephone Encounter (Signed)
Copied from CRM [id number]. Topic: Clinical - Medication Question >> Apr 27, 2023 10:36 AM Elizebeth Brooking wrote: Reason for CRM: Patient called in to be reschedule due to weather, wanted to send a message to Dr.Fry because she is all out of mediation and wanted to know what can she do until Monday, would like to know if something could be called in for her

## 2023-04-28 ENCOUNTER — Ambulatory Visit: Payer: Medicare HMO | Admitting: Family Medicine

## 2023-04-28 NOTE — Telephone Encounter (Signed)
If she means the Oxycodone, she will need a PMV for refills

## 2023-04-29 NOTE — Telephone Encounter (Signed)
Pt has appointment scheduled for this

## 2023-04-29 NOTE — Telephone Encounter (Signed)
Pt has a PMV scheduled for 05/03/23

## 2023-04-30 NOTE — Telephone Encounter (Signed)
 Done

## 2023-05-03 ENCOUNTER — Ambulatory Visit: Payer: Medicare HMO | Admitting: Family Medicine

## 2023-05-10 ENCOUNTER — Encounter: Payer: Self-pay | Admitting: Family Medicine

## 2023-05-10 ENCOUNTER — Ambulatory Visit (INDEPENDENT_AMBULATORY_CARE_PROVIDER_SITE_OTHER): Payer: Medicare HMO | Admitting: Family Medicine

## 2023-05-10 VITALS — BP 136/84 | HR 103 | Temp 97.6°F | Wt 211.0 lb

## 2023-05-10 DIAGNOSIS — J40 Bronchitis, not specified as acute or chronic: Secondary | ICD-10-CM | POA: Diagnosis not present

## 2023-05-10 DIAGNOSIS — K5900 Constipation, unspecified: Secondary | ICD-10-CM

## 2023-05-10 MED ORDER — DOCUSATE SODIUM 100 MG PO CAPS: 100.0000 mg | ORAL_CAPSULE | Freq: Two times a day (BID) | ORAL | 5 refills | Status: DC

## 2023-05-10 MED ORDER — DOXYCYCLINE HYCLATE 100 MG PO TABS: 100.0000 mg | ORAL_TABLET | Freq: Two times a day (BID) | ORAL | 0 refills | Status: DC

## 2023-05-10 NOTE — Progress Notes (Signed)
   Subjective:    Patient ID: Laurie Schaefer, female    DOB: 08/19/1955, 68 y.o.   MRN: [id number]  HPI Here for 2 issues. First she is not over the infection that we saw her for on 03-30-23. She had sinus congestion and a dry cough. We gave her a shot of DepoMedrol and 10 days of Augmentin. This helped somewhat but she still has chest congestion and a cough. No fever. The other issue is constipation. She uses Miralax every day, but she still gets constipated. She feels bloated but does not have abdominal pain.    Review of Systems  Constitutional: Negative.   HENT: Negative.    Eyes: Negative.   Respiratory:  Positive for cough, chest tightness, shortness of breath and wheezing.   Cardiovascular: Negative.   Gastrointestinal:  Positive for constipation. Negative for abdominal distention, abdominal pain, blood in stool, diarrhea, nausea and vomiting.       Objective:   Physical Exam Constitutional:      Appearance: Normal appearance.  HENT:     Right Ear: Tympanic membrane, ear canal and external ear normal.     Left Ear: Tympanic membrane, ear canal and external ear normal.     Nose: Nose normal.     Mouth/Throat:     Pharynx: Oropharynx is clear.  Eyes:     Conjunctiva/sclera: Conjunctivae normal.  Pulmonary:     Effort: Pulmonary effort is normal.     Breath sounds: Wheezing present. No rhonchi or rales.  Abdominal:     General: Abdomen is flat. Bowel sounds are normal. There is no distension.     Palpations: Abdomen is soft. There is no mass.     Tenderness: There is no abdominal tenderness. There is no guarding or rebound.     Hernia: No hernia is present.  Lymphadenopathy:     Cervical: No cervical adenopathy.  Neurological:     Mental Status: She is alert.           Assessment & Plan:  She still has some residual bronchitis, so we will treat with 10 days of Doxycycline. For the constipation, she will continue to use Miralax daily but we will add Colace  BID. Gershon Crane, MD

## 2023-05-12 ENCOUNTER — Encounter: Payer: Self-pay | Admitting: Family Medicine

## 2023-05-12 ENCOUNTER — Telehealth (INDEPENDENT_AMBULATORY_CARE_PROVIDER_SITE_OTHER): Admitting: Family Medicine

## 2023-05-12 DIAGNOSIS — F1190 Opioid use, unspecified, uncomplicated: Secondary | ICD-10-CM

## 2023-05-12 DIAGNOSIS — M797 Fibromyalgia: Secondary | ICD-10-CM | POA: Diagnosis not present

## 2023-05-12 MED ORDER — OXYCODONE HCL 30 MG PO TABS: 30.0000 mg | ORAL_TABLET | ORAL | 0 refills | Status: DC | PRN

## 2023-05-12 NOTE — Progress Notes (Signed)
 Subjective:    Patient ID: Laurie Schaefer, female    DOB: 30-Mar-1955, 68 y.o.   MRN: [id number]  HPI Virtual Visit via Video Note  I connected with the patient on 05/12/23 at  1:45 PM EST by a video enabled telemedicine application and verified that I am speaking with the correct person using two identifiers.  Location patient: home Location provider:work or home office Persons participating in the virtual visit: patient, provider  I discussed the limitations of evaluation and management by telemedicine and the availability of in person appointments. The patient expressed understanding and agreed to proceed.   HPI: Here for pain management. She is doing about the same with daily muscle pains.    ROS: See pertinent positives and negatives per HPI.  Past Medical History:  Diagnosis Date   Arthritis    Bipolar disorder (HCC)    Chronic neck pain    had previously seen Dr. Salli Real at [facility]    COPD (chronic obstructive pulmonary disease) [facility])    sees Dr. Delton Coombes    Depression    Fibromyalgia    Headache    Hypertension     Past Surgical History:  Procedure Laterality Date   COLONOSCOPY  08/29/2021   per Dr. Carney Harder in [facility]   FRACTURE SURGERY      Family History  Problem Relation Age of Onset   Depression Mother    Hypertension Mother    Stroke Father    Hypertension Father      Current Outpatient Medications:    albuterol (VENTOLIN HFA) 108 (90 Base) MCG/ACT inhaler, INHALE 2 PUFFS INTO THE LUNGS EVERY 6 HOURS AS NEEDED FOR WHEEZING OR SHORTNESS OF BREATH, Disp: 8.5 g, Rfl: 3   atorvastatin (LIPITOR) 10 MG tablet, Take 1 tablet (10 mg total) by mouth daily., Disp: 90 tablet, Rfl: 3   budesonide-formoterol (SYMBICORT) 160-4.5 MCG/ACT inhaler, Inhale 2 puffs into the lungs in the morning and at bedtime., Disp: 1 each, Rfl: 12   buPROPion (WELLBUTRIN XL) 150 MG 24 hr tablet, Take 1 tablet (150 mg total) by mouth daily., Disp: , Rfl:     clotrimazole-betamethasone (LOTRISONE) cream, Apply 1 Application topically 2 (two) times daily., Disp: 45 g, Rfl: 5   docusate sodium (COLACE) 100 MG capsule, Take 1 capsule (100 mg total) by mouth 2 (two) times daily., Disp: 60 capsule, Rfl: 5   doxycycline (VIBRA-TABS) 100 MG tablet, Take 1 tablet (100 mg total) by mouth 2 (two) times daily., Disp: 20 tablet, Rfl: 0   ELIQUIS 5 MG TABS tablet, TAKE 1 TABLET BY MOUTH TWICE A DAY, Disp: 60 tablet, Rfl: 11   ketoconazole (NIZORAL) 2 % cream, To apply 2 times daily to yeast rash, Disp: 45 g, Rfl: 3   ketoconazole (NIZORAL) 2 % cream, Apply 1 application twice a day as needed for yeast rashes, Disp: 45 g, Rfl: 3   LORazepam (ATIVAN) 2 MG tablet, TAKE 1 TABLET BY MOUTH UP TO EVERY 6 HOURS AS NEEDED FOR ANXIETY, Disp: 120 tablet, Rfl: 5   losartan-hydrochlorothiazide (HYZAAR) 100-25 MG tablet, Take 0.5 tablets by mouth daily., Disp: 45 tablet, Rfl: 3   methocarbamol (ROBAXIN) 500 MG tablet, Take 1 tablet (500 mg total) by mouth every 8 (eight) hours as needed for muscle spasms., Disp: 90 tablet, Rfl: 5   nitrofurantoin, macrocrystal-monohydrate, (MACROBID) 100 MG capsule, Take 1 capsule (100 mg total) by mouth 2 (two) times daily., Disp: 14 capsule, Rfl: 0  ondansetron (ZOFRAN) 8 MG tablet, Take 1 tablet (8 mg total) by mouth every 6 (six) hours as needed for nausea or vomiting., Disp: 60 tablet, Rfl: 5   oxycodone (ROXICODONE) 30 MG immediate release tablet, Take 1 tablet (30 mg total) by mouth every 4 (four) hours as needed for pain., Disp: 180 tablet, Rfl: 0   oxycodone (ROXICODONE) 30 MG immediate release tablet, Take 1 tablet (30 mg total) by mouth every 4 (four) hours as needed for pain., Disp: 180 tablet, Rfl: 0   oxycodone (ROXICODONE) 30 MG immediate release tablet, Take 1 tablet (30 mg total) by mouth every 4 (four) hours as needed for pain., Disp: 180 tablet, Rfl: 0   OXYGEN, Place 3 L into the nose continuous., Disp: , Rfl:     pantoprazole (PROTONIX) 40 MG tablet, TAKE 1 TABLET BY MOUTH EVERY DAY, Disp: 90 tablet, Rfl: 1   polyethylene glycol powder (GLYCOLAX/MIRALAX) 17 GM/SCOOP powder, Take 17 g by mouth 2 (two) times daily as needed., Disp: 3350 g, Rfl: 5   potassium chloride (KLOR-CON) 10 MEQ tablet, Take 1 tablet (10 mEq total) by mouth daily., Disp: 90 tablet, Rfl: 3   prazosin (MINIPRESS) 2 MG capsule, Take 1 capsule (2 mg total) by mouth at bedtime. (Patient taking differently: Take 2 mg by mouth at bedtime as needed (sleep).), Disp: 90 capsule, Rfl: 3   predniSONE (DELTASONE) 10 MG tablet, Take 1 tablet (10 mg total) by mouth daily as needed (shortness of breath)., Disp: 90 tablet, Rfl: 3   pregabalin (LYRICA) 50 MG capsule, Take one capsule in the morning and one in the afternoon, Disp: 60 capsule, Rfl: 5   Psyllium 25 % POWD, Take 1-2 teaspoons once daily with first meal of the day., Disp: 425 g, Rfl: 3   QUEtiapine (SEROQUEL) 25 MG tablet, TAKE 1 TABLET BY MOUTH EVERYDAY AT BEDTIME, Disp: 90 tablet, Rfl: 2   SUMAtriptan (IMITREX) 100 MG tablet, TAKE 1 TABLET BY MOUTH AS NEEDED FOR MIGRAINE. MAY REPEAT IN 2 HOURS IF HEADACHE PERSISTS OR RECURS., Disp: 9 tablet, Rfl: 2   Tiotropium Bromide Monohydrate (SPIRIVA RESPIMAT) 2.5 MCG/ACT AERS, Inhale 2 puffs into the lungs daily., Disp: 1 each, Rfl: 12   topiramate (TOPAMAX) 50 MG tablet, TAKE 1 TABLET BY MOUTH EVERYDAY AT BEDTIME, Disp: 90 tablet, Rfl: 0  EXAM:  VITALS per patient if applicable:  GENERAL: alert, oriented, appears well and in no acute distress  HEENT: atraumatic, conjunttiva clear, no obvious abnormalities on inspection of external nose and ears  NECK: normal movements of the head and neck  LUNGS: on inspection no signs of respiratory distress, breathing rate appears normal, no obvious gross SOB, gasping or wheezing  CV: no obvious cyanosis  MS: moves all visible extremities without noticeable abnormality  PSYCH/NEURO: pleasant and  cooperative, no obvious depression or anxiety, speech and thought processing grossly intact  ASSESSMENT AND PLAN: Pain management.  Indication for chronic opioid: fibromyalgia Medication and dose: Oxycodone 30 mg # pills per month: 180 Last UDS date: 08-11-22 Opioid Treatment Agreement signed (Y/N): 01-15-20 Opioid Treatment Agreement last reviewed with patient:  05-12-23 [facility] reviewed this encounter (include red flags): Yes Meds were refilled. Gershon Crane, MD  Discussed the following assessment and plan:  No diagnosis found.     I discussed the assessment and treatment plan with the patient. The patient was provided an opportunity to ask questions and all were answered. The patient agreed with the plan and demonstrated an understanding of the instructions.  The patient was advised to call back or seek an in-person evaluation if the symptoms worsen or if the condition fails to improve as anticipated.      Review of Systems     Objective:   Physical Exam        Assessment & Plan:

## 2023-05-17 ENCOUNTER — Telehealth: Payer: Self-pay

## 2023-05-17 NOTE — Telephone Encounter (Signed)
 Copied from CRM [id number]. Topic: General - Other >> May 17, 2023  1:43 PM Aletta Edouard wrote: Reason for CRM: patient is calling in wanting to speak with the supervisor over the billing department in the office she would like a call back

## 2023-05-24 ENCOUNTER — Other Ambulatory Visit: Payer: Self-pay | Admitting: Family Medicine

## 2023-05-24 DIAGNOSIS — G43809 Other migraine, not intractable, without status migrainosus: Secondary | ICD-10-CM

## 2023-05-24 NOTE — Telephone Encounter (Signed)
 LOV:  05/12/2023 VV  11/05/2022 Fill date  Refills: 120/5

## 2023-05-24 NOTE — Telephone Encounter (Signed)
 05/12/2023 LOV  11/05/2022 fill dayte  120/5 refills

## 2023-05-24 NOTE — Telephone Encounter (Signed)
 Copied from CRM [id number]. Topic: Clinical - Prescription Issue >> May 24, 2023  9:34 AM Arm[place] J wrote: Reason for CRM: Prior authorization sent in by [facility] needed for oxycodone 30 mg tablet. Patient is completley out of medication. Insurance also needs a reason as to why patient needs to take medication due to possible side effects by mixing other medications.

## 2023-05-25 ENCOUNTER — Other Ambulatory Visit (HOSPITAL_COMMUNITY): Payer: Self-pay

## 2023-05-25 ENCOUNTER — Telehealth: Payer: Self-pay

## 2023-05-25 ENCOUNTER — Telehealth: Payer: Self-pay | Admitting: Family Medicine

## 2023-05-25 NOTE — Telephone Encounter (Signed)
 Copied from CRM [id number]. Topic: Clinical - Prescription Issue >> May 24, 2023  9:34 AM Armenia J wrote: Reason for CRM: Prior authorization sent in by [facility] needed for oxycodone 30 mg tablet. Patient is completley out of medication. Insurance also needs a reason as to why patient needs to take medication due to possible side effects by mixing other medications. >> May 24, 2023  5:33 PM Eunice Blase wrote: Francine Graven per Alejandra ph: [phone] fax: [phone] called again regarding Prior authorization sent in by [facility] needed for oxycodone 30 mg tablet. Patient is completley out of medication. Insurance also needs a reason as to why patient needs to take medication due to possible side effects by mixing other medications.Please contact [employer].

## 2023-05-25 NOTE — Telephone Encounter (Signed)
 Pharmacy Patient Advocate Encounter   Received notification from Pt Calls Messages that prior authorization for Oxycodone 30 mg IR is required/requested.   Insurance verification completed.   The patient is insured through [place] .   Per test claim: The current 30 day co-pay is, $70.35.  No PA needed at this time. This test claim was processed through [facility]- copay amounts may vary at other pharmacies due to pharmacy/plan contracts, or as the patient moves through the different stages of their insurance plan.

## 2023-05-25 NOTE — Telephone Encounter (Signed)
 Copied from CRM [id number]. Topic: Clinical - Prescription Issue >> May 25, 2023  4:04 PM Fredrich Romans wrote: Reason for CRM: Patient called in to check on the PA for her pain medication for [facility].

## 2023-05-25 NOTE — Telephone Encounter (Signed)
 Copied from CRM [id number]. Topic: Clinical - Prescription Issue >> May 24, 2023  9:34 AM Laurie Schaefer wrote: Reason for CRM: Prior authorization sent in by [facility] needed for oxycodone 30 mg tablet. Patient is completley out of medication. Insurance also needs a reason as to why patient needs to take medication due to possible side effects by mixing other medications. >> May 24, 2023  5:33 PM Laurie Schaefer wrote: Laurie Schaefer per Laurie Schaefer ph: [phone] fax: [phone] called again regarding Prior authorization sent in by [facility] needed for oxycodone 30 mg tablet. Patient is completley out of medication. Insurance also needs a reason as to why patient needs to take medication due to possible side effects by mixing other medications.Please contact [employer].

## 2023-05-25 NOTE — Telephone Encounter (Signed)
 Pharmacy Patient Advocate Encounter   Received notification from Pt Calls Messages that prior authorization for Oxycodone 30mg  tabs is required/requested.   Insurance verification completed.   The patient is insured through [place] .   Per test claim: PA required; PA submitted to above mentioned insurance via Phone Key/confirmation #/[id number] Status is pending   Should receive determination within 24 hours.

## 2023-05-26 MED ORDER — OXYCODONE HCL 30 MG PO TABS
30.0000 mg | ORAL_TABLET | Freq: Four times a day (QID) | ORAL | 0 refills | Status: DC | PRN
Start: 2023-05-26 — End: 2023-08-13

## 2023-05-26 NOTE — Addendum Note (Signed)
 Addended by: Gershon Crane A on: 05/26/2023 05:03 PM   Modules accepted: Orders

## 2023-05-26 NOTE — Telephone Encounter (Signed)
 Spoke with Laurie Schaefer agreed for Dr Clent Ridges to send a new Rx for Oxycodone per  pharmacy's request

## 2023-05-26 NOTE — Telephone Encounter (Signed)
 Pt called the office back and pt request was sent to Dr Clent Ridges for approval

## 2023-05-26 NOTE — Telephone Encounter (Signed)
 Noted.

## 2023-05-26 NOTE — Telephone Encounter (Signed)
 I suggest we decrease the RX amount to 120 (taking a max of 4 pills a day) to satisfy the pharmacy's target. If she agrees, I will change the orders

## 2023-05-26 NOTE — Telephone Encounter (Signed)
 Spoke wit pt pharmacy state that pt Oxycodone is being rejected due to the high dose of Morphine equivalent and the quantity. Please advise

## 2023-05-26 NOTE — Telephone Encounter (Signed)
 I sent in a new RX for #120 pills

## 2023-05-26 NOTE — Telephone Encounter (Signed)
 Pt called back and asked what was the issue with her pain medicine. I informed her that the clinic is actively working on it. She stated she has been trying to get her pain medicine since 3/05 and then asked if the nurse could give her a call. Please call and advise.

## 2023-05-26 NOTE — Telephone Encounter (Signed)
 Spoke with pt pharmacy states that PA is not needed at this time, pharmacy state that the equivalent of the morphine and quantity is too high. Message sent to Dr Clent Ridges for advise

## 2023-05-26 NOTE — Telephone Encounter (Signed)
 Noted, pt notified of Dr Clent Ridges recommendation via MyChart, awaiting pt to contact the office back regarding the changes needed with this prescription

## 2023-05-27 ENCOUNTER — Telehealth: Payer: Self-pay | Admitting: *Deleted

## 2023-05-27 ENCOUNTER — Encounter: Payer: Self-pay | Admitting: Family Medicine

## 2023-05-27 ENCOUNTER — Other Ambulatory Visit (HOSPITAL_COMMUNITY): Payer: Self-pay

## 2023-05-27 NOTE — Telephone Encounter (Signed)
 Pharmacy Patient Advocate Encounter  Received notification from [facility] that Prior Authorization for Oxycodone 30mg  tab has been APPROVED from 05/26/23 to 03/08/24   Approval letter indexed to media tab.

## 2023-05-27 NOTE — Telephone Encounter (Signed)
 Done

## 2023-05-27 NOTE — Telephone Encounter (Signed)
 Copied from CRM [id number]. Topic: General - Other >> May 26, 2023  5:21 PM Eunice Blase wrote: Reason for CRM: Pt would like a call back [phone] regarding replacing oxycodone (ROXICODONE) 30 MG immediate release tablet with which medication.

## 2023-05-31 MED ORDER — OXYCODONE HCL 20 MG PO TABS: 1.0000 | ORAL_TABLET | Freq: Every day | ORAL | 0 refills | Status: DC

## 2023-05-31 NOTE — Telephone Encounter (Signed)
 I sent in #30 of the 20 mg dose to add to what she has

## 2023-06-06 ENCOUNTER — Other Ambulatory Visit: Payer: Self-pay | Admitting: Family Medicine

## 2023-06-07 ENCOUNTER — Telehealth: Payer: Self-pay

## 2023-06-07 DIAGNOSIS — N6320 Unspecified lump in the left breast, unspecified quadrant: Secondary | ICD-10-CM

## 2023-06-07 NOTE — Telephone Encounter (Signed)
 Copied from CRM [id number]. Topic: Referral - Question >> Jun 07, 2023  2:04 PM Fredrich Romans wrote: Reason for CRM: Patient would like to know if she could have an order sent out for her to get set up for her mammogram? [facility]  [employer]

## 2023-06-08 NOTE — Telephone Encounter (Signed)
 I ordered the mammogram and left breast US like she had last time

## 2023-06-08 NOTE — Addendum Note (Signed)
 Addended by: Gershon Crane A on: 06/08/2023 05:33 PM   Modules accepted: Orders

## 2023-06-08 NOTE — Telephone Encounter (Signed)
error 

## 2023-06-09 ENCOUNTER — Telehealth: Payer: Self-pay | Admitting: Family Medicine

## 2023-06-09 NOTE — Telephone Encounter (Signed)
 Copied from CRM [id number]. Topic: Clinical - Prescription Issue >> Jun 09, 2023 10:42 AM Florestine Avers wrote: Reason for CRM: Patient called in stating that she needed to leave a message for Nurse Harriett Sine, she said she got her message on mychart and [facility] said until Dr. Clent Ridges fills out the paper work and sends it to him, he can not fill out oxycodone medication. Patient is also requesting a call back  follow up if the medication has been filled.

## 2023-06-10 ENCOUNTER — Telehealth: Payer: Self-pay

## 2023-06-10 ENCOUNTER — Ambulatory Visit (INDEPENDENT_AMBULATORY_CARE_PROVIDER_SITE_OTHER): Admitting: Family Medicine

## 2023-06-10 ENCOUNTER — Encounter: Payer: Self-pay | Admitting: Family Medicine

## 2023-06-10 VITALS — BP 162/84 | HR 99 | Temp 98.2°F | Ht 65.0 in | Wt 211.2 lb

## 2023-06-10 DIAGNOSIS — B888 Other specified infestations: Secondary | ICD-10-CM | POA: Diagnosis not present

## 2023-06-10 DIAGNOSIS — L309 Dermatitis, unspecified: Secondary | ICD-10-CM

## 2023-06-10 NOTE — Telephone Encounter (Signed)
 Patient had an appointment today with Dr. Salomon Fick for rash on arms back and legs, after provider left the room patient came out of the room and asked to see the Doctor. I went back into room patient asked if the provider could come back into the room to look in her ears and nose to see if there where bug bites there also, and to look at her hallux nail that was black, I explained to the patient that her Visit is acute, and I would ask if Dr. Salomon Fick was able to come back into the room. Provider was unable to come back into the room, I let the patient know that she could follow-up any other concerns with her PCP, patient was very upset, patient stated that there was no Medication given and she wasn't leaving until the provider came back into the room and addressed her other concerns. I spoke with Candice the [employer], and asked if she could speak with the patient.

## 2023-06-10 NOTE — Telephone Encounter (Signed)
 Spoke with pt pharmacy stated that they will not fill the Oxycodone 20 mg since pt is already in Oxycodone 30 mg and pt picked up Rx on 05/29/23. Please advise

## 2023-06-10 NOTE — Addendum Note (Signed)
 Addended by: Gershon Crane A on: 06/10/2023 12:07 PM   Modules accepted: Orders

## 2023-06-10 NOTE — Patient Instructions (Signed)
 While they do help with itching,I know you are concerned about antihistamines causing the opposite reaction.   If you would like you could try taking half a tab Allegra first thing in the morning to mitigate the symptoms.  Otherwise you can use over-the-counter cortisone cream but this may be difficult to apply to the numerous bites.

## 2023-06-10 NOTE — Telephone Encounter (Signed)
 Spoke with pt pharmacy advised per Dr Clent Ridges orders to cancel the Oxycodone 20 mg and pt will stay on the 30 mg as prescribed

## 2023-06-10 NOTE — Telephone Encounter (Signed)
 Tell the pharmacy to cancel the 20 mg RX. She will just take the 30 mg as prescribed

## 2023-06-10 NOTE — Progress Notes (Signed)
 Established Patient Office Visit   Subjective  Patient ID: Laurie Schaefer, female    DOB: [birth year]  Age: 68 y.o. MRN: [id number]  Chief Complaint  Patient presents with   Rash  Pt accompanied by her partner.  Patient is a 68 year old female followed by Dr. Clent Ridges and seen for ongoing concern.  Patient endorses pruritic rash on bilateral arms, back, and ankles x 1 month.  States she did not mention symptoms at her last office visit with PCP.  Noticed symptoms after moving furniture and clothing from her brother's home, who recently died.  Erythematous bumps without drainage.  Pt typically wears a sleeveless or short-sleeved night gown to bed that stops just below the knee.  Pt's partner sleeps in another room, does not have any bites.  Pt does not have any pets as her dog died in Mar 11, 2024.  Pt mentions noticing tiny black dots in center of mattress when bedding removed, denies seeing blood.  Pt's partner concerned that she has bedbugs.  Patient feels spots are from spider bites.  Pt scratching bites.  Has used calamine lotion and cortisone cream.    Patient Active Problem List   Diagnosis Date Noted   Grief reaction 03/17/2023   Snoring 02/03/2023   Chest pain 02/03/2023   CCC (chronic calculous cholecystitis) 02/26/2022   S/P laparoscopic cholecystectomy 02/26/2022   Gall stones 01/20/2022   Aortic atherosclerosis (HCC) 01/20/2022   Acute pulmonary embolism (HCC) 01/16/2020   Pulmonary embolism (HCC) 01/15/2020   Noncompliance 12/08/2018   COPD with acute exacerbation (HCC) 02/04/2018   Fatigue 12/08/2017   Atelectasis 11/25/2017   Cough variant asthma vs uacs/vcd 07/18/2017   DOE (dyspnea on exertion) 07/16/2017   Fibromyalgia 05/03/2017   Migraines 04/12/2017   Chronic respiratory failure with hypoxia (HCC) 04/01/2017   Allergic rhinitis 03/13/2016   Vocal cord dysfunction 01/29/2016   COPD (chronic obstructive pulmonary disease) (HCC) 12/19/2015   Obesity 12/19/2015    Bipolar I disorder (HCC) 08/27/2015   INSOMNIA, CHRONIC 11/03/2006   Depression 11/03/2006   Essential hypertension 11/03/2006   MENOPAUSAL SYNDROME 11/03/2006   Osteoarthritis 11/03/2006   Past Medical History:  Diagnosis Date   Arthritis    Bipolar disorder (HCC)    Chronic neck pain    had previously seen Dr. Salli Real at [facility]    COPD (chronic obstructive pulmonary disease) [facility])    sees Dr. Delton Coombes    Depression    Fibromyalgia    Headache    Hypertension    Past Surgical History:  Procedure Laterality Date   COLONOSCOPY  08/29/2021   per Dr. Carney Harder in [place]   FRACTURE SURGERY     Social History   Tobacco Use   Smoking status: Never    Passive exposure: Never   Smokeless tobacco: Never  Vaping Use   Vaping status: Never Used  Substance Use Topics   Alcohol use: No    Alcohol/week: 0.0 standard drinks of alcohol   Drug use: No   Family History  Problem Relation Age of Onset   Depression Mother    Hypertension Mother    Stroke Father    Hypertension Father    Allergies  Allergen Reactions   Ciprofloxacin Other (See Comments)    Per patient makes her "wacko"    Codeine Nausea Only   Latex Itching      ROS Negative unless stated above    Objective:     BP (!) 162/84 (BP Location:  Left Wrist, Patient Position: Sitting, Cuff Size: Normal) Comment: patient has just taken a albuterol neb treatment and prednisone  Pulse 99   Temp 98.2 F (36.8 C) (Oral)   Ht 5\' 5"  (1.651 m)   Wt 211 lb 3.2 oz (95.8 kg)   SpO2 97%   BMI 35.15 kg/m  BP Readings from Last 3 Encounters:  06/10/23 (!) 162/84  05/10/23 136/84  03/30/23 120/82   Wt Readings from Last 3 Encounters:  06/10/23 211 lb 3.2 oz (95.8 kg)  05/10/23 211 lb (95.7 kg)  03/30/23 208 lb (94.3 kg)      Physical Exam Constitutional:      Appearance: Normal appearance.     Comments: On O2 via [place].  HENT:     Head: Normocephalic and atraumatic.  Cardiovascular:      Rate and Rhythm: Normal rate.  Pulmonary:     Effort: Pulmonary effort is normal.  Musculoskeletal:        General: Normal range of motion.  Skin:    General: Skin is warm and dry.     Findings: Erythema, lesion and rash present. Rash is papular.     Comments:  scattered erythematous, circular, slightly raised papules on bilateral upper extremities, base of right neck, back, and bilateral ankles to mid shin.  No burrows between webspaces or linear distribution of rash.  No lesions present on abdomen.  Neurological:     Mental Status: She is alert and oriented to person, place, and time.      No results found for any visits on 06/10/23.    Assessment & Plan:  Infestation by bed bug  Dermatitis   Pt with numerous erythematous skin lesions concerning for likely bedbug bites given distribution and appearance.  Discussed supportive care including antihistamines.  Patient declines stating Benadryl gives her the opposite effect.  Upon further discussion patient unsure if she has tried any other antihistamine.  Advised to try OTC Allegra half a tab first thing in the morning.  Can also use topical cortisone cream however may be difficult to apply given number and location of lesions.  Discussed importance of appropriately treating any bugs present in the home.  Advised to contact an exterminator company for assistance.  At end of visit patient requested provider return to address additional concerns.  Advised by staff pt was seen for an acute visit and will need to set up follow-up for further issues as provider was seeing other patients.  Patient was displeased with the response and refused to leave exam room.  An office manager discussed situation with patient who eventually left.  In the future please refrain from scheduling pt with this provider given her behavior.  Return f/u with pcp if symptoms worsen or fail to improve.   Deeann Saint, MD

## 2023-06-14 NOTE — Telephone Encounter (Signed)
 Pt notified via My Chart

## 2023-06-15 ENCOUNTER — Ambulatory Visit: Admitting: Family Medicine

## 2023-06-16 ENCOUNTER — Ambulatory Visit: Admitting: Family Medicine

## 2023-06-21 ENCOUNTER — Ambulatory Visit: Admitting: Family Medicine

## 2023-06-24 ENCOUNTER — Other Ambulatory Visit

## 2023-06-24 ENCOUNTER — Encounter

## 2023-06-28 ENCOUNTER — Encounter: Payer: Self-pay | Admitting: Family Medicine

## 2023-06-28 ENCOUNTER — Encounter

## 2023-06-28 ENCOUNTER — Other Ambulatory Visit

## 2023-06-29 ENCOUNTER — Ambulatory Visit (INDEPENDENT_AMBULATORY_CARE_PROVIDER_SITE_OTHER): Admitting: Family Medicine

## 2023-06-29 ENCOUNTER — Encounter: Payer: Self-pay | Admitting: Family Medicine

## 2023-06-29 ENCOUNTER — Other Ambulatory Visit: Payer: Self-pay

## 2023-06-29 VITALS — BP 130/78 | HR 114 | Temp 98.1°F | Wt 211.0 lb

## 2023-06-29 DIAGNOSIS — B86 Scabies: Secondary | ICD-10-CM | POA: Diagnosis not present

## 2023-06-29 DIAGNOSIS — N390 Urinary tract infection, site not specified: Secondary | ICD-10-CM | POA: Diagnosis not present

## 2023-06-29 LAB — POCT URINALYSIS DIPSTICK
Bilirubin, UA: NEGATIVE
Blood, UA: NEGATIVE
Glucose, UA: NEGATIVE
Ketones, UA: NEGATIVE
Nitrite, UA: NEGATIVE
Protein, UA: POSITIVE — AB
Spec Grav, UA: 1.02 (ref 1.010–1.025)
Urobilinogen, UA: 1 U/dL
pH, UA: 6 (ref 5.0–8.0)

## 2023-06-29 MED ORDER — MALATHION 0.5 % EX LOTN: TOPICAL_LOTION | Freq: Once | CUTANEOUS | 0 refills | Status: AC

## 2023-06-29 MED ORDER — NITROFURANTOIN MONOHYD MACRO 100 MG PO CAPS
100.0000 mg | ORAL_CAPSULE | Freq: Two times a day (BID) | ORAL | 0 refills | Status: DC
Start: 2023-06-29 — End: 2023-07-30

## 2023-06-29 MED ORDER — DOCUSATE SODIUM 100 MG PO CAPS: 100.0000 mg | ORAL_CAPSULE | Freq: Two times a day (BID) | ORAL | 5 refills | Status: DC

## 2023-06-29 NOTE — Addendum Note (Signed)
 Addended by: Philbert Brave on: 06/29/2023 03:21 PM   Modules accepted: Orders

## 2023-06-29 NOTE — Progress Notes (Signed)
   Subjective:    Patient ID: Laurie Schaefer, female    DOB: 02/11/1956, 68 y.o.   MRN: [id number]  HPI Here for 6 weeks of itchy red spots all over her body. This started after she spent time in her mother's house moving furniture. She was seen here on 06-10-23 for this, and it was felt these were due to bed bugs. She has been applying Calamine lotion and cortisone creams with no relief. She also complains of increased urgency of urination with burning the past 2 weeks. No fever.    Review of Systems     Objective:   Physical Exam        Assessment & Plan:  She has scabies, and we will treat this with Ovide  lotion. Apply over the body in the evening and shower it off the next morning. Repeat an application 7 days later. She also has a UTI. We will treat this with 7 days of Macrobid . Culture the sample.  Corita Diego, MD

## 2023-06-30 LAB — URINE CULTURE
MICRO NUMBER:: 16359478
Result:: NO GROWTH
SPECIMEN QUALITY:: ADEQUATE

## 2023-07-02 NOTE — Telephone Encounter (Signed)
 Pt had a visit with Dr Alyne Babinski on 06/29/23 for this problem

## 2023-07-08 ENCOUNTER — Encounter

## 2023-07-08 ENCOUNTER — Other Ambulatory Visit

## 2023-07-18 ENCOUNTER — Encounter: Payer: Self-pay | Admitting: Family Medicine

## 2023-07-19 ENCOUNTER — Ambulatory Visit: Payer: Self-pay

## 2023-07-19 NOTE — Telephone Encounter (Signed)
 Left detailed message for pt on both phone numbers on file, advised to call 911 or seek medical emergency ASAP. Will keep trying the call

## 2023-07-19 NOTE — Telephone Encounter (Signed)
 Copied from CRM [id number]. Topic: Clinical - Red Word Triage >> Jul 19, 2023  4:59 PM Felizardo Hotter wrote: Red Word that prompted transfer to Nurse Triage: Pt returning call regarding shortness of breath. Pt was advised to go to ER or call 911.   Chief Complaint: Chest pain Symptoms: Chest pain, shortness of breath  Frequency: Intermittent  Disposition: [x] ED /[] Urgent Care (no appt availability in office) / [] Appointment(In office/virtual)/ []  [facility]/ [] Home Care/ [x] Refused Recommended Disposition /[] [facility]/ []  Follow-up with PCP Additional Notes: Patient states she is returning a call. Patient reports she reached out yesterday due to experiencing chest pain and shortness of breath for the last week. She states her pain is moderate and intermittent, and is improved after increasing her oxygen  to 4L. I advised her that with her symptoms she should go to the ED for evaluation. Patient declined, stating that she is feeling better at this time. I reinforced the importance of being evaluated for her own safety. Patient verbalized understanding and states she will go to the ED if her symptoms worsen, but will otherwise wait for her upcomming office appointment.    Reason for Disposition  [1] Chest pain lasts > 5 minutes AND [2] occurred in past 3 days (72 hours) (Exception: Feels exactly the same as previously diagnosed heartburn and has accompanying sour taste in mouth.)  Answer Assessment - Initial Assessment Questions 1. LOCATION: "Where does it hurt?"       Middle to left side   2. RADIATION: "Does the pain go anywhere else?" (e.g., into neck, jaw, arms, back)     No 3. ONSET: "When did the chest pain begin?" (Minutes, hours or days)      2 weeks ago  4. PATTERN: "Does the pain come and go, or has it been constant since it started?"  "Does it get worse with exertion?"      Intermittent  5. DURATION: "How long does it last" (e.g., seconds, minutes, hours)      Varies  6. SEVERITY: "How bad is the pain?"  (e.g., Scale 1-10; mild, moderate, or severe)    - MILD (1-3): doesn't interfere with normal activities     - MODERATE (4-7): interferes with normal activities or awakens from sleep    - SEVERE (8-10): excruciating pain, unable to do any normal activities       5/10 7. CARDIAC RISK FACTORS: "Do you have any history of heart problems or risk factors for heart disease?" (e.g., angina, prior heart attack; diabetes, high blood pressure, high cholesterol, smoker, or strong family history of heart disease)     Yes 8. PULMONARY RISK FACTORS: "Do you have any history of lung disease?"  (e.g., blood clots in lung, asthma, emphysema, birth control pills)     Yes 9. CAUSE: "What do you think is causing the chest pain?"     Unsure  10. OTHER SYMPTOMS: "Do you have any other symptoms?" (e.g., dizziness, nausea, vomiting, sweating, fever, difficulty breathing, cough)       Shortness of breath  Protocols used: Chest Pain-A-AH

## 2023-07-20 NOTE — Telephone Encounter (Signed)
 Attempted to contact pt several times with no success, left detailed message to call the office back

## 2023-07-20 NOTE — Telephone Encounter (Signed)
 Scheduled pt appointment for 07/30/23

## 2023-07-20 NOTE — Telephone Encounter (Signed)
 FYI Spoke with pt stated that she is having SOB and heaviness in the chest, pt was triage for this symptoms advised to go to the ED but pt refused to go. Pt state that she is on continuous Oxygen  which is helping with SOB, pt states that she is also taking prednisone .Pt was offered a sooner appointment to see Dr Alyne Babinski but insisted on getting appointment for a CPE / Labs. Pt is aware to go to the ED if symptoms gets worse before her CPE appointment with Dr Alyne Babinski.

## 2023-07-30 ENCOUNTER — Ambulatory Visit (INDEPENDENT_AMBULATORY_CARE_PROVIDER_SITE_OTHER): Admitting: Family Medicine

## 2023-07-30 ENCOUNTER — Encounter: Payer: Self-pay | Admitting: Family Medicine

## 2023-07-30 ENCOUNTER — Ambulatory Visit: Payer: Self-pay | Admitting: Family Medicine

## 2023-07-30 VITALS — BP 128/78 | HR 86 | Temp 98.4°F | Ht 65.0 in | Wt 214.0 lb

## 2023-07-30 DIAGNOSIS — R739 Hyperglycemia, unspecified: Secondary | ICD-10-CM | POA: Diagnosis not present

## 2023-07-30 DIAGNOSIS — I10 Essential (primary) hypertension: Secondary | ICD-10-CM

## 2023-07-30 DIAGNOSIS — M797 Fibromyalgia: Secondary | ICD-10-CM

## 2023-07-30 DIAGNOSIS — Z6832 Body mass index (BMI) 32.0-32.9, adult: Secondary | ICD-10-CM

## 2023-07-30 DIAGNOSIS — F319 Bipolar disorder, unspecified: Secondary | ICD-10-CM | POA: Diagnosis not present

## 2023-07-30 DIAGNOSIS — E6609 Other obesity due to excess calories: Secondary | ICD-10-CM

## 2023-07-30 DIAGNOSIS — E66811 Obesity, class 1: Secondary | ICD-10-CM

## 2023-07-30 DIAGNOSIS — I2699 Other pulmonary embolism without acute cor pulmonale: Secondary | ICD-10-CM | POA: Diagnosis not present

## 2023-07-30 DIAGNOSIS — M150 Primary generalized (osteo)arthritis: Secondary | ICD-10-CM

## 2023-07-30 DIAGNOSIS — J439 Emphysema, unspecified: Secondary | ICD-10-CM

## 2023-07-30 DIAGNOSIS — G4700 Insomnia, unspecified: Secondary | ICD-10-CM

## 2023-07-30 LAB — LIPID PANEL
Cholesterol: 133 mg/dL (ref 0–200)
HDL: 47.7 mg/dL (ref >39.00)
LDL Cholesterol: 65 mg/dL (ref 0–99)
NonHDL: 84.94
Total CHOL/HDL Ratio: 3
Triglycerides: 99 mg/dL (ref 0.0–149.0)
VLDL: 19.8 mg/dL (ref 0.0–40.0)

## 2023-07-30 LAB — CBC WITH DIFFERENTIAL/PLATELET
Basophils Absolute: 0.1 10*3/uL (ref 0.0–0.1)
Basophils Relative: 0.8 % (ref 0.0–3.0)
Eosinophils Absolute: 0.2 10*3/uL (ref 0.0–0.7)
Eosinophils Relative: 1.5 % (ref 0.0–5.0)
HCT: 40.6 % (ref 36.0–46.0)
Hemoglobin: 13.5 g/dL (ref 12.0–15.0)
Lymphocytes Relative: 21.1 % (ref 12.0–46.0)
Lymphs Abs: 2.3 10*3/uL (ref 0.7–4.0)
MCHC: 33.4 g/dL (ref 30.0–36.0)
MCV: 87.1 fl (ref 78.0–100.0)
Monocytes Absolute: 1 10*3/uL (ref 0.1–1.0)
Monocytes Relative: 8.9 % (ref 3.0–12.0)
Neutro Abs: 7.4 10*3/uL (ref 1.4–7.7)
Neutrophils Relative %: 67.7 % (ref 43.0–77.0)
Platelets: 248 10*3/uL (ref 150.0–400.0)
RBC: 4.66 Mil/uL (ref 3.87–5.11)
RDW: 13.9 % (ref 11.5–15.5)
WBC: 10.9 10*3/uL — ABNORMAL HIGH (ref 4.0–10.5)

## 2023-07-30 LAB — TSH: TSH: 3.53 u[IU]/mL (ref 0.35–5.50)

## 2023-07-30 LAB — BASIC METABOLIC PANEL WITH GFR
BUN: 15 mg/dL (ref 6–23)
CO2: 27 meq/L (ref 19–32)
Calcium: 8.6 mg/dL (ref 8.4–10.5)
Chloride: 105 meq/L (ref 96–112)
Creatinine, Ser: 0.84 mg/dL (ref 0.40–1.20)
GFR: 70.92 mL/min (ref >60.00)
Glucose, Bld: 104 mg/dL — ABNORMAL HIGH (ref 70–99)
Potassium: 3.5 meq/L (ref 3.5–5.1)
Sodium: 141 meq/L (ref 135–145)

## 2023-07-30 LAB — HEPATIC FUNCTION PANEL
ALT: 15 U/L (ref 0–35)
AST: 19 U/L (ref 0–37)
Albumin: 3.8 g/dL (ref 3.5–5.2)
Alkaline Phosphatase: 77 U/L (ref 39–117)
Bilirubin, Direct: 0.3 mg/dL (ref 0.0–0.3)
Total Bilirubin: 1.2 mg/dL (ref 0.2–1.2)
Total Protein: 6.7 g/dL (ref 6.0–8.3)

## 2023-07-30 LAB — HEMOGLOBIN A1C: Hgb A1c MFr Bld: 5.7 % (ref 4.6–6.5)

## 2023-07-30 MED ORDER — LOSARTAN POTASSIUM-HCTZ 100-25 MG PO TABS: 0.5000 | ORAL_TABLET | Freq: Every day | ORAL | 3 refills | Status: AC

## 2023-07-30 MED ORDER — PANTOPRAZOLE SODIUM 40 MG PO TBEC: 40.0000 mg | DELAYED_RELEASE_TABLET | Freq: Every day | ORAL | 3 refills | Status: AC

## 2023-07-30 MED ORDER — TOPIRAMATE 50 MG PO TABS
50.0000 mg | ORAL_TABLET | Freq: Every day | ORAL | 3 refills | Status: DC
Start: 2023-07-30 — End: 2023-11-19

## 2023-07-30 MED ORDER — CLOTRIMAZOLE-BETAMETHASONE 1-0.05 % EX CREA: 1.0000 | TOPICAL_CREAM | Freq: Two times a day (BID) | CUTANEOUS | 5 refills | Status: AC

## 2023-07-30 MED ORDER — LEVALBUTEROL HCL 0.63 MG/3ML IN NEBU: 0.6300 mg | INHALATION_SOLUTION | RESPIRATORY_TRACT | 3 refills | Status: AC | PRN

## 2023-07-30 MED ORDER — PREDNISONE 10 MG PO TABS: 10.0000 mg | ORAL_TABLET | Freq: Two times a day (BID) | ORAL | 3 refills | Status: AC

## 2023-07-30 MED ORDER — POTASSIUM CHLORIDE ER 10 MEQ PO TBCR: 10.0000 meq | EXTENDED_RELEASE_TABLET | Freq: Every day | ORAL | 3 refills | Status: AC

## 2023-07-30 MED ORDER — PREGABALIN 50 MG PO CAPS: ORAL_CAPSULE | ORAL | 3 refills | Status: AC

## 2023-07-30 NOTE — Progress Notes (Signed)
 Subjective:    Patient ID: Laurie Schaefer, female    DOB: 09-01-1955, 68 y.o.   MRN: [id number]  HPI Here to follow up on issues. She has no particular concerns. We had written for her to take one 10 mg Prednisone  a day last year, but she asks if she can go up to 2 a day. This helps her OA, her fibromyalgia, and her COPD all at once. Her BP is stable. The COPD is stable. Her moods have been stable.    Review of Systems  Constitutional: Negative.   HENT: Negative.    Eyes: Negative.   Respiratory:  Positive for shortness of breath.   Cardiovascular: Negative.   Gastrointestinal: Negative.   Genitourinary:  Negative for decreased urine volume, difficulty urinating, dyspareunia, dysuria, enuresis, flank pain, frequency, hematuria, pelvic pain and urgency.  Musculoskeletal:  Positive for arthralgias.  Skin: Negative.   Neurological: Negative.  Negative for headaches.  Psychiatric/Behavioral: Negative.         Objective:   Physical Exam Constitutional:      General: She is not in acute distress.    Appearance: She is well-developed. She is obese.  HENT:     Head: Normocephalic and atraumatic.     Right Ear: External ear normal.     Left Ear: External ear normal.     Nose: Nose normal.     Mouth/Throat:     Pharynx: No oropharyngeal exudate.  Eyes:     General: No scleral icterus.    Conjunctiva/sclera: Conjunctivae normal.     Pupils: Pupils are equal, round, and reactive to light.  Neck:     Thyroid : No thyromegaly.     Vascular: No JVD.  Cardiovascular:     Rate and Rhythm: Normal rate and regular rhythm.     Pulses: Normal pulses.     Heart sounds: Normal heart sounds. No murmur heard.    No friction rub. No gallop.  Pulmonary:     Effort: Pulmonary effort is normal. No respiratory distress.     Breath sounds: Normal breath sounds. No wheezing or rales.  Chest:     Chest wall: No tenderness.  Abdominal:     General: Bowel sounds are normal. There is no  distension.     Palpations: Abdomen is soft. There is no mass.     Tenderness: There is no abdominal tenderness. There is no guarding or rebound.  Musculoskeletal:        General: No tenderness. Normal range of motion.     Cervical back: Normal range of motion and neck supple.  Lymphadenopathy:     Cervical: No cervical adenopathy.  Skin:    General: Skin is warm and dry.     Findings: No erythema or rash.  Neurological:     General: No focal deficit present.     Mental Status: She is alert and oriented to person, place, and time.     Cranial Nerves: No cranial nerve deficit.     Motor: No abnormal muscle tone.     Coordination: Coordination normal.     Deep Tendon Reflexes: Reflexes are normal and symmetric. Reflexes normal.  Psychiatric:        Mood and Affect: Mood normal.        Behavior: Behavior normal.        Thought Content: Thought content normal.        Judgment: Judgment normal.           Assessment & Plan:  Her OA and fibromyalgia are stable. She is in pain management with us . Her COPD is stable. Her Bipolar disorder is stable. Her HTN  and migraines are stable. Get fasting labs today. We spent a total of (35   ) minutes reviewing records and discussing these issues.  Corita Diego, MD

## 2023-08-04 ENCOUNTER — Encounter

## 2023-08-04 ENCOUNTER — Other Ambulatory Visit

## 2023-08-11 ENCOUNTER — Ambulatory Visit: Admitting: Family Medicine

## 2023-08-13 ENCOUNTER — Other Ambulatory Visit: Payer: Self-pay | Admitting: Family Medicine

## 2023-08-13 ENCOUNTER — Ambulatory Visit (INDEPENDENT_AMBULATORY_CARE_PROVIDER_SITE_OTHER): Admitting: Family Medicine

## 2023-08-13 ENCOUNTER — Encounter: Payer: Self-pay | Admitting: Family Medicine

## 2023-08-13 VITALS — BP 124/78 | HR 106 | Temp 98.4°F | Wt 210.0 lb

## 2023-08-13 DIAGNOSIS — G8929 Other chronic pain: Secondary | ICD-10-CM

## 2023-08-13 DIAGNOSIS — M797 Fibromyalgia: Secondary | ICD-10-CM

## 2023-08-13 MED ORDER — OXYCODONE HCL ER 40 MG PO T12A: 40.0000 mg | EXTENDED_RELEASE_TABLET | Freq: Every day | ORAL | 0 refills | Status: DC

## 2023-08-13 MED ORDER — OXYCODONE HCL 30 MG PO TABS: 30.0000 mg | ORAL_TABLET | ORAL | 0 refills | Status: DC | PRN

## 2023-08-13 NOTE — Progress Notes (Signed)
   Subjective:    Patient ID: Laurie Schaefer, female    DOB: [birth year], 68 y.o.   MRN: [id number]  HPI Here for pain management. She has been doing fairly well during the day with the Oxycodone  and the Prednisone , but she always develops pain at night during the time until her first dose the next morning.    Review of Systems  Constitutional: Negative.   Musculoskeletal:  Positive for myalgias.       Objective:   Physical Exam Constitutional:      Appearance: Normal appearance.  Neurological:     Mental Status: She is alert.           Assessment & Plan:  Pain management.  Indication for chronic opioid: fibromyalgia  Medication and dose: Oxycodone  30 mg and Oxycontin  40 mg  # pills per month: 150 and 30 Last UDS date: 08-13-23 Opioid Treatment Agreement signed (Y/N): 01-15-20 Opioid Treatment Agreement last reviewed with patient:  08-13-23 [facility] reviewed this encounter (include red flags): Yes In order to give her pain relief through the night, she will try Oxycontin  40 mg at bedtime.  Corita Diego, MD

## 2023-08-16 ENCOUNTER — Other Ambulatory Visit: Payer: Self-pay | Admitting: Family Medicine

## 2023-08-16 DIAGNOSIS — G43809 Other migraine, not intractable, without status migrainosus: Secondary | ICD-10-CM

## 2023-08-16 LAB — DRUG MONITORING, PANEL 8 WITH CONFIRMATION, URINE
6 Acetylmorphine: NEGATIVE ng/mL (ref <10)
Alcohol Metabolites: NEGATIVE ng/mL (ref <500)
Alphahydroxyalprazolam: NEGATIVE ng/mL (ref <25)
Alphahydroxymidazolam: NEGATIVE ng/mL (ref <50)
Alphahydroxytriazolam: NEGATIVE ng/mL (ref <50)
Aminoclonazepam: NEGATIVE ng/mL (ref <25)
Amphetamines: NEGATIVE ng/mL (ref <500)
Benzodiazepines: POSITIVE ng/mL — AB (ref <100)
Buprenorphine, Urine: NEGATIVE ng/mL (ref <5)
Cocaine Metabolite: NEGATIVE ng/mL (ref <150)
Codeine: NEGATIVE ng/mL (ref <50)
Creatinine: 199.3 mg/dL (ref >=20.0)
Hydrocodone: NEGATIVE ng/mL (ref <50)
Hydromorphone: NEGATIVE ng/mL (ref <50)
Hydroxyethylflurazepam: NEGATIVE ng/mL (ref <50)
Lorazepam: 4154 ng/mL — ABNORMAL HIGH (ref <50)
MDMA: NEGATIVE ng/mL (ref <500)
Marijuana Metabolite: NEGATIVE ng/mL (ref <20)
Morphine: NEGATIVE ng/mL (ref <50)
Nordiazepam: NEGATIVE ng/mL (ref <50)
Norhydrocodone: NEGATIVE ng/mL (ref <50)
Noroxycodone: >10000 ng/mL — ABNORMAL HIGH (ref <50)
Opiates: NEGATIVE ng/mL (ref <100)
Oxazepam: 769 ng/mL — ABNORMAL HIGH (ref <50)
Oxidant: NEGATIVE ug/mL (ref <200)
Oxycodone: 6960 ng/mL — ABNORMAL HIGH (ref <50)
Oxycodone: POSITIVE ng/mL — AB (ref <100)
Oxymorphone: 8700 ng/mL — ABNORMAL HIGH (ref <50)
Temazepam: 5901 ng/mL — ABNORMAL HIGH (ref <50)
pH: 5.3 (ref 4.5–9.0)

## 2023-08-16 LAB — DM TEMPLATE

## 2023-08-30 ENCOUNTER — Ambulatory Visit
Admission: RE | Admit: 2023-08-30 | Discharge: 2023-08-30 | Disposition: A | Discharge status: Home / Self Care | Source: Ambulatory Visit | Attending: Family Medicine | Admitting: Family Medicine

## 2023-08-30 DIAGNOSIS — N6320 Unspecified lump in the left breast, unspecified quadrant: Secondary | ICD-10-CM

## 2023-08-30 DIAGNOSIS — R928 Other abnormal and inconclusive findings on diagnostic imaging of breast: Secondary | ICD-10-CM | POA: Diagnosis not present

## 2023-08-31 ENCOUNTER — Ambulatory Visit: Payer: Self-pay

## 2023-08-31 NOTE — Telephone Encounter (Signed)
 FYI Only or Action Required?: FYI only for provider.  Patient was last seen in primary care on 08/13/2023 by Johnny Garnette LABOR, MD. Called Nurse Triage reporting Chest Pain. Symptoms began 2 months ago. Interventions attempted: Other: under cardiology care-patient unhappy with cardiology. Symptoms are: unchanged.  Triage Disposition: See Physician Within 24 Hours  Patient/caregiver understands and will follow disposition?: Yes  Copied from CRM [id number]. Topic: Clinical - Red Word Triage >> Aug 31, 2023  9:32 AM Laurie Schaefer wrote: Red Word that prompted transfer to Nurse Triage: shortness of breathe Reason for Disposition  [1] Chest pain lasts > 5 minutes AND [2] occurred > 3 days ago (72 hours) AND [3] NO chest pain or cardiac symptoms now  Answer Assessment - Initial Assessment Questions 1. LOCATION: Where does it hurt?       Right side of chest but patient reports that it is difficult to pinpoint.  2. RADIATION: Does the pain go anywhere else? (e.g., into neck, jaw, arms, back)     Patient is unable to state the chest pain radiates anywhere.  3. ONSET: When did the chest pain begin? (Minutes, hours or days)      Started two months ago 4. PATTERN: Does the pain come and go, or has it been constant since it started?  Does it get worse with exertion?      Comes and goes 5. DURATION: How long does it last (e.g., seconds, minutes, hours)     Chest pain can last from five minutes to off and on all day.  6. SEVERITY: How bad is the pain?  (e.g., Scale 1-10; mild, moderate, or severe)    - MILD (1-3): doesn't interfere with normal activities     - MODERATE (4-7): interferes with normal activities or awakens from sleep    - SEVERE (8-10): excruciating pain, unable to do any normal activities       No pain currently.  7. CARDIAC RISK FACTORS: Do you have any history of heart problems or risk factors for heart disease? (e.g., angina, prior heart attack; diabetes, high blood pressure, high  cholesterol, smoker, or strong family history of heart disease)     High cholesterol 8. PULMONARY RISK FACTORS: Do you have any history of lung disease?  (e.g., blood clots in lung, asthma, emphysema, birth control pills)     COPD 9. CAUSE: What do you think is causing the chest pain?     unsure 10. OTHER SYMPTOMS: Do you have any other symptoms? (e.g., dizziness, nausea, vomiting, sweating, fever, difficulty breathing, cough)       No  Patient denies any active chest pain today. Patient calling wanting to speak with PCP about the least invasive test to check for blockages in her heart. Patient states she is unhappy with cardiology and wants to see PCP. Appointment made with PCP for tomorrow at 2:45 PM.  Protocols used: Chest Pain-A-AH

## 2023-09-01 ENCOUNTER — Ambulatory Visit (INDEPENDENT_AMBULATORY_CARE_PROVIDER_SITE_OTHER): Admitting: Family Medicine

## 2023-09-01 ENCOUNTER — Encounter: Payer: Self-pay | Admitting: Family Medicine

## 2023-09-01 ENCOUNTER — Ambulatory Visit: Admitting: Family Medicine

## 2023-09-01 VITALS — BP 118/78 | HR 99 | Temp 98.5°F | Wt 209.0 lb

## 2023-09-01 DIAGNOSIS — M940 Chondrocostal junction syndrome [Tietze]: Secondary | ICD-10-CM | POA: Diagnosis not present

## 2023-09-01 DIAGNOSIS — R079 Chest pain, unspecified: Secondary | ICD-10-CM | POA: Diagnosis not present

## 2023-09-01 MED ORDER — QUETIAPINE FUMARATE 25 MG PO TABS: 25.0000 mg | ORAL_TABLET | Freq: Every day | ORAL | 3 refills | Status: DC

## 2023-09-01 NOTE — Progress Notes (Signed)
   Subjective:    Patient ID: Laurie Schaefer, female    DOB: 10/16/1955, 68 y.o.   MRN: [id number]  HPI Here to discuss recent chest pains. She had some pains like this a few years ago, and we diagnosed them as due to costochondritis. We treated them with Prednisone  and they went away. Now for the past 2 weeks she is having similar pains. She describes these as sharp in nature or grabbing. They begin suddenly, and they are not associated with exertion. They last from 5 minutes to an hour per episode, and they come and go throughout the day. She is worried that these could be related to her heart. Of note, she had an ECHO in November 2023 showing an EF of 55%, mild LVH, and normal diastolic function. She had a coronary CT angiogram in February 2024 showing no plaque in the coronary arteries and a calcium  score of zero. Her GERD is controlled by taking Pantoprazole  daily.    Review of Systems  Constitutional: Negative.   Respiratory: Negative.    Cardiovascular:  Positive for chest pain. Negative for palpitations and leg swelling.  Gastrointestinal: Negative.        Objective:   Physical Exam Constitutional:      General: She is not in acute distress.    Appearance: Normal appearance.   Cardiovascular:     Rate and Rhythm: Normal rate and regular rhythm.     Pulses: Normal pulses.     Heart sounds: Normal heart sounds.  Pulmonary:     Effort: Pulmonary effort is normal.     Breath sounds: Normal breath sounds.     Comments: She is quite tender over the sternum   Neurological:     Mental Status: She is alert.           Assessment & Plan:  These chest pains are due to costochondritis. We discussed the benign nature of these pains. She already takes low dose Prednisone  daily. She can also apply Voltaren gel to the chest wall QID as needed. She will follow up as needed. We spent a total of (34   ) minutes reviewing records and discussing these issues.  Garnette Olmsted, MD

## 2023-09-02 ENCOUNTER — Telehealth: Payer: Self-pay

## 2023-09-02 DIAGNOSIS — K5900 Constipation, unspecified: Secondary | ICD-10-CM

## 2023-09-02 DIAGNOSIS — J439 Emphysema, unspecified: Secondary | ICD-10-CM

## 2023-09-02 NOTE — Progress Notes (Signed)
   09/02/2023  Patient ID: Laurie Schaefer, female   DOB: 1955/09/16, 68 y.o.   MRN: [id number]  Received notice that patient is having cost concerns with symbicort  and eliquis . Placing referral order so scheduling team can reach out to assist.  [name], PharmD Clinical Pharmacist [phone]

## 2023-09-02 NOTE — Telephone Encounter (Signed)
 Copied from CRM [id number]. Topic: Clinical - Lab/Test Results >> Aug 31, 2023  4:39 PM Rilla B wrote: Reason for CRM: Patient was referred for an echocardiogram in November and would like to talk to Camie Lites or a nurse prior to scheduling. She did not have the test because the doctor did not explain anything to her and did not take up time with her. Please call patient.  ATC x1 unable to leave a VM on patient's phone .

## 2023-09-03 ENCOUNTER — Telehealth: Payer: Self-pay | Admitting: *Deleted

## 2023-09-03 NOTE — Progress Notes (Unsigned)
 Care Guide Pharmacy Note  09/03/2023 Name: Laurie Schaefer MRN: [id number] DOB: [birth year]  Referred By: Johnny Garnette LABOR, MD Reason for referral: Call Attempt #1 and Complex Care Management (Outreach to schedule referral with pharmacist )   Laurie Schaefer is a 68 y.o. year old female who is a primary care patient of Johnny Garnette LABOR, MD.  Laurie Schaefer was referred to the pharmacist for assistance related to: med assistance   An unsuccessful telephone outreach was attempted today to contact the patient who was referred to the pharmacy team for assistance with medication assistance. Additional attempts will be made to contact the patient.  Thedford Franks, CMA [place]  [facility], [facility] [facility] Guide Direct Dial: [phone]  Fax: [phone] Website: [email or web address]

## 2023-09-06 NOTE — Progress Notes (Unsigned)
 Care Guide Pharmacy Note  09/06/2023 Name: Verenise Moulin MRN: [id number] DOB: 1955/10/04  Referred By: Johnny Garnette LABOR, MD Reason for referral: Call Attempt #1 and Complex Care Management (Outreach to schedule referral with pharmacist )   Sharlet Teresa Condon is a 68 y.o. year old female who is a primary care patient of Johnny Garnette LABOR, MD.  Sharlet Teresa Wellman was referred to the pharmacist for assistance related to: med assistance  A second unsuccessful telephone outreach was attempted today to contact the patient who was referred to the pharmacy team for assistance with medication assistance. Additional attempts will be made to contact the patient.  Thedford Franks, CMA [place]  [facility], [facility] Guide Direct Dial: [phone]  Fax: [phone] Website: [email or web address]

## 2023-09-07 NOTE — Progress Notes (Signed)
 Care Guide Pharmacy Note  09/07/2023 Name: Laurie Schaefer MRN: [id number] DOB: 1955/08/01  Referred By: Johnny Garnette LABOR, MD Reason for referral: Call Attempt #1 and Complex Care Management (Outreach to schedule referral with pharmacist )   Laurie Schaefer is a 68 y.o. year old female who is a primary care patient of Johnny Garnette LABOR, MD.  Laurie Schaefer was referred to the pharmacist for assistance related to: med assistance   Successful contact was made with the patient to discuss pharmacy services including being ready for the pharmacist to call at least 5 minutes before the scheduled appointment time and to have medication bottles and any blood pressure readings ready for review. The patient agreed to meet with the pharmacist via telephone visit on 09/15/2023  Thedford Franks, CMA [place]  [facility], [facility] Guide Direct Dial: [phone]  Fax: [phone] Website: [email or web address]

## 2023-09-08 ENCOUNTER — Other Ambulatory Visit: Payer: Self-pay | Admitting: Family Medicine

## 2023-09-14 IMAGING — DX DG CHEST 2V
2 series · 2 of 2 positions shown · non-contrast
Comparison: 05/24/2020

CLINICAL DATA: Shortness of breath. Bradycardia. Nausea and
vomiting.

EXAM:
CHEST - 2 VIEW

[chest pa]
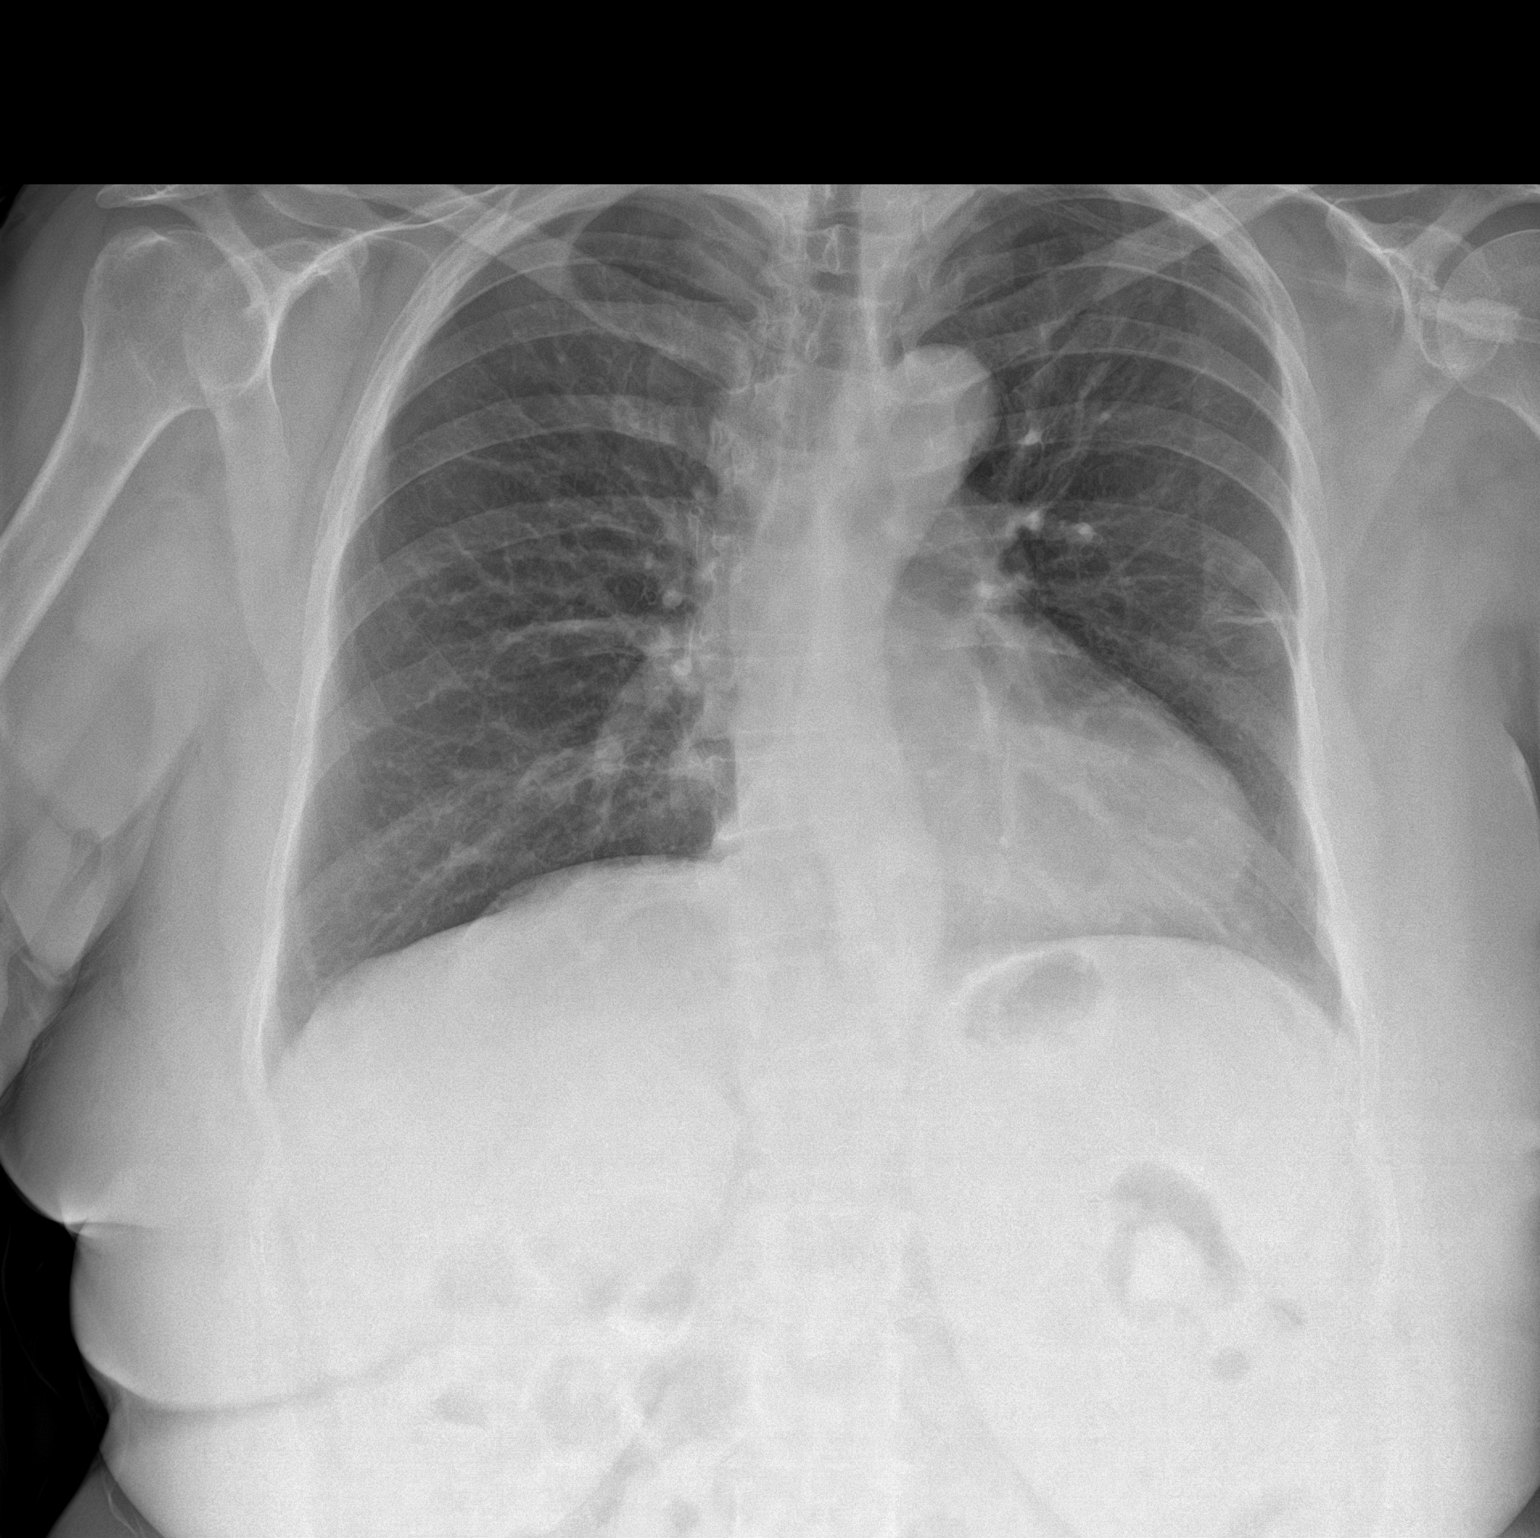

[chest lat]
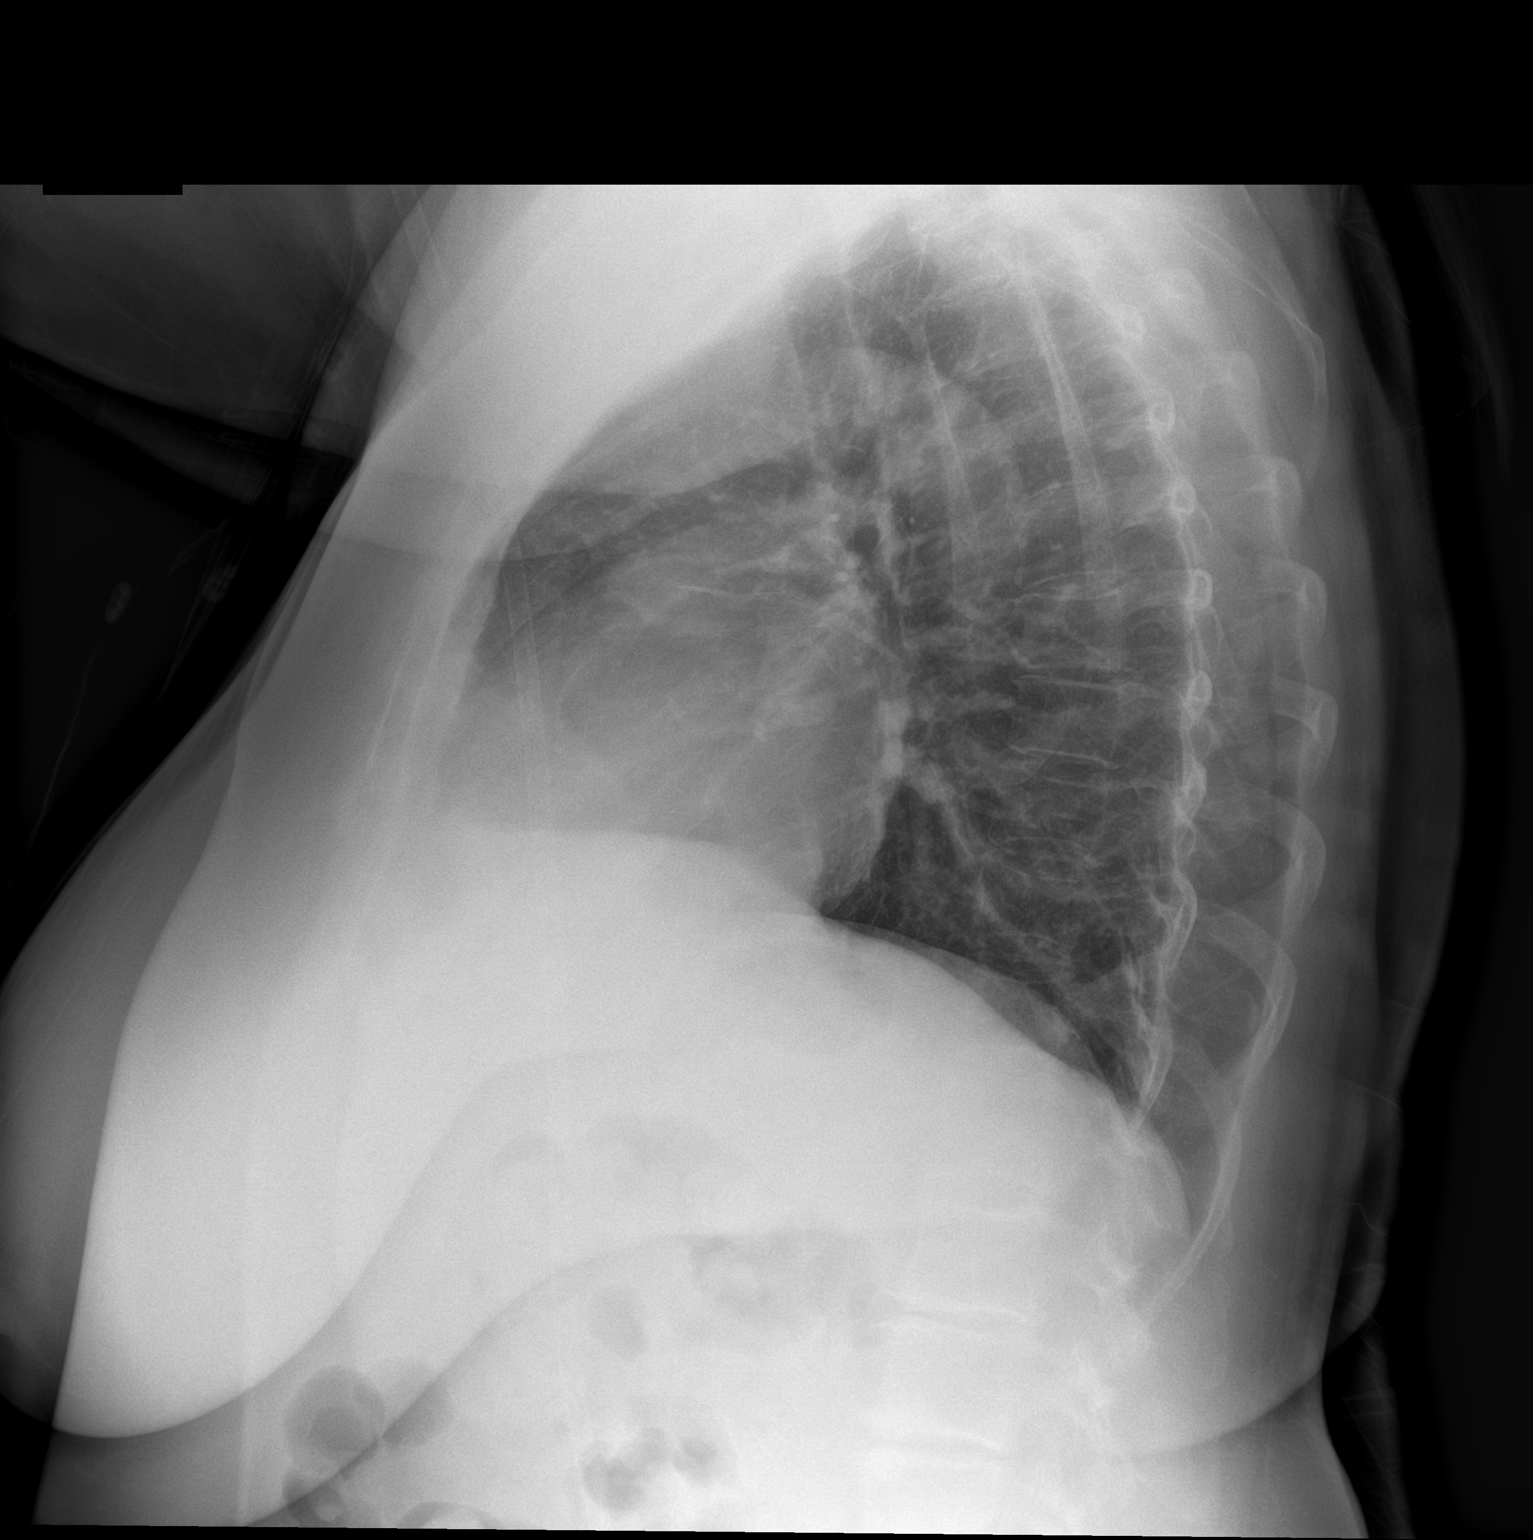

[2 of 2 positions shown; findings below may reference images not displayed]

FINDINGS: The heart size and mediastinal contours are within normal limits.
Mild scarring noted in peripheral left mid lung. Both lungs are
otherwise clear. The visualized skeletal structures are
unremarkable.
IMPRESSION: No active cardiopulmonary disease.

## 2023-09-15 ENCOUNTER — Encounter: Payer: Self-pay | Admitting: Family Medicine

## 2023-09-15 ENCOUNTER — Other Ambulatory Visit (INDEPENDENT_AMBULATORY_CARE_PROVIDER_SITE_OTHER)

## 2023-09-15 DIAGNOSIS — Z79899 Other long term (current) drug therapy: Secondary | ICD-10-CM

## 2023-09-15 NOTE — Telephone Encounter (Signed)
 Done

## 2023-09-20 ENCOUNTER — Telehealth: Payer: Self-pay

## 2023-09-20 NOTE — Progress Notes (Cosign Needed Addendum)
   09/20/2023  Patient ID: Laurie Schaefer, female   DOB: Jul 14, 1955, 68 y.o.   MRN: [id number]  Reached out to patient upon PCP referral for cost concerns related to medications.  Having issues affording eliquis , symbicort  and spiriva .  Patient may qualify for patient assistance for Eliquis .  No patient assistance for symbicort  at this time.  Patient may qualify for [facility] assistance, pulm office approves the change. Will have med access team mail application to patient for Eliquis  and Breztri. . Reports she does have supply of all medications on hand at this time.  Follow Up: 2 weeks  [name], PharmD Clinical Pharmacist [phone]

## 2023-09-20 NOTE — Telephone Encounter (Signed)
 Gave pt a call to let her know I will be mailing out both application AZ&ME Briztri and BMS Eliquis , spoke with pt and is aware and will be on the look, pt also have questions on some of her meds, told pt I will let RPH Jon  give her a call back,pt wants a call back tomorrow,send [facility] Angela msg.

## 2023-09-20 NOTE — Telephone Encounter (Signed)
 Hello,  Can you mail patient assistance application to this patient for the following medications:  Eliquis  through BMS Breztri through AZ&Me

## 2023-09-21 NOTE — Telephone Encounter (Signed)
 Thank you! Attempted to call her back but had to leave a voicemail. I have follow up scheduled with her for next week as well and can follow up on the paperwork/questions then if she does not return my call sooner.

## 2023-09-23 NOTE — Telephone Encounter (Signed)
 Received provider portion today on AZ&ME and [employer].waiting on pt portion to mail back to us .

## 2023-09-24 ENCOUNTER — Other Ambulatory Visit: Payer: Self-pay | Admitting: Family Medicine

## 2023-09-24 NOTE — Telephone Encounter (Signed)
 Copied from CRM [id number]. Topic: Clinical - Medication Refill >> Sep 24, 2023  3:15 PM Shardie S wrote: Medication: oxycodone  (ROXICODONE ) 30 MG immediate release tablet  Has the patient contacted their pharmacy? Yes (Agent: If no, request that the patient contact the pharmacy for the refill. If patient does not wish to contact the pharmacy document the reason why and proceed with request.) (Agent: If yes, when and what did the pharmacy advise?)  This is the patient's preferred pharmacy:    [facility] [place], [place] - [address] [place] [address] Phone: [phone] Fax: [phone]  Is this the correct pharmacy for this prescription? No If no, delete pharmacy and type the correct one.   Has the prescription been filled recently? No  Is the patient out of the medication? Yes  Has the patient been seen for an appointment in the last year OR does the patient have an upcoming appointment? Yes  Can we respond through MyChart? Yes  Agent: Please be advised that Rx refills may take up to 3 business days. We ask that you follow-up with your pharmacy.

## 2023-09-28 ENCOUNTER — Telehealth: Payer: Self-pay

## 2023-09-28 NOTE — Telephone Encounter (Signed)
 Received a call from Mrs. Venneman, pt was worry about not sending the pap for Eliquis  and Briztri before the Appt with [facility] Jon tomorrow,pt said she will get in touch with RPH and will drop pap and proof of income with her,but was going to talk with [facility] before doing anything(mail out) back to me.

## 2023-09-28 NOTE — Progress Notes (Signed)
   09/28/2023  Patient ID: Laurie Schaefer, female   DOB: Apr 09, 1955, 68 y.o.   MRN: [id number]  Patient called in stating that she has received the PAP paperwork for Eliquis  and Breztri. She would like to change our appt tmrw to an in office visit so I can review the paperwork with her and make sure everything has been completed appropriately.  Reminded patient to bring a print out of her 2025 out of pocket expenses.  [name], PharmD Clinical Pharmacist [phone]

## 2023-09-29 ENCOUNTER — Ambulatory Visit

## 2023-09-29 ENCOUNTER — Telehealth: Payer: Self-pay

## 2023-09-29 NOTE — Progress Notes (Signed)
   09/29/2023  Patient ID: Laurie Schaefer, female   DOB: 1955-07-09, 68 y.o.   MRN: [id number]  Received call from patient requesting to cancel appt in office today. Will return completed applications via mail. Notified the med access team.  [name], PharmD Clinical Pharmacist [phone]

## 2023-10-02 ENCOUNTER — Encounter: Payer: Self-pay | Admitting: Family Medicine

## 2023-10-05 MED ORDER — OXYCODONE HCL 30 MG PO TABS: 30.0000 mg | ORAL_TABLET | ORAL | 0 refills | Status: DC | PRN

## 2023-10-05 NOTE — Telephone Encounter (Signed)
 I sent in one month of the Oxycodone  30 mg

## 2023-10-06 ENCOUNTER — Telehealth: Payer: Self-pay

## 2023-10-06 NOTE — Telephone Encounter (Signed)
 Gave pt a call to follow up on mail out PAP AZ&ME & BMS, pt did not answer left a HIPAA VM.

## 2023-10-06 NOTE — Telephone Encounter (Signed)
 Left VM for patient to return call about medications needed to be sent to new pharmacy requested.    Copied from CRM [id number]. Topic: Clinical - Prescription Issue >> Oct 05, 2023  3:16 PM Chiquita SQUIBB wrote: Reason for CRM: Patients friend Arvella is calling in stating that [facility] [address] was very rude to the patient and would not fill her medications, and stated that they laughed in her face. Arvella is requesting all the patients medications be transferred to [facility] as they would not [facility] use [facility].

## 2023-10-08 NOTE — Telephone Encounter (Signed)
 Received pt portion of PAP BMS and AZ&ME along med list and Ins. card copy.both PAP  have been fax to both companies and will follow up in a couple of days.

## 2023-10-11 NOTE — Telephone Encounter (Signed)
 Received approval letter from [employer]&ME Breztri today,left pt a HIPAA VM,approval letter was index.

## 2023-10-12 ENCOUNTER — Telehealth: Payer: Self-pay | Admitting: Gastroenterology

## 2023-10-12 ENCOUNTER — Ambulatory Visit: Admitting: Gastroenterology

## 2023-10-12 NOTE — Telephone Encounter (Signed)
 Inbound call from patient she does not feel well. She has been vomiting as well as a slight fever. Patient states she is not sure if she is contagious.   Patient states she will come in if needed she does want to be charged.   Please advise.

## 2023-10-12 NOTE — Telephone Encounter (Signed)
 She won't be charged, just keep rescheduled appt.

## 2023-10-12 NOTE — Telephone Encounter (Signed)
 Called patient to further advise on previous note. Unsuccessful.

## 2023-10-13 NOTE — Telephone Encounter (Signed)
 Received a denial letter from [employer](Eliquis ),pt has not met 3% out of pocket prescription expenses base on the household income and this can be up to $2000 annual income out of pocket expenses. Left a HIPAA VM at pt number.

## 2023-10-13 NOTE — Telephone Encounter (Signed)
 Received a letter from Kasandra Senters today to confirm shipping preference,have answer ans faxed back to [facility] today.

## 2023-10-14 ENCOUNTER — Telehealth: Payer: Self-pay

## 2023-10-14 NOTE — Progress Notes (Signed)
   10/14/2023  Patient ID: Laurie Schaefer, female   DOB: 05-11-1955, 69 y.o.   MRN: [id number]  Contacted patient via telephone to provide update on patient assistance applications.  Breztri has been approved through AZ&Me through 03/08/24. Notified patient to be on lookout for shipment to arrive, and that this replaces the Symbicort  and Spiriva .  Eliquis  had been denied due to patient not meeting the OOP spending requirements as of now. Patient aware she may qualify later in the year if she continues to provide her OOP reports from pharmacy.  Follow Up: 1 month   [name], PharmD Clinical Pharmacist [phone]

## 2023-11-02 ENCOUNTER — Other Ambulatory Visit: Payer: Self-pay | Admitting: Family Medicine

## 2023-11-09 ENCOUNTER — Ambulatory Visit: Admitting: Gastroenterology

## 2023-11-09 ENCOUNTER — Telehealth: Payer: Self-pay | Admitting: Gastroenterology

## 2023-11-09 NOTE — Progress Notes (Deleted)
 Chief Complaint:constipation Primary GI Doctor:Dr. Federico  HPI:  Patient is a  68  year old female patient with past medical history of COPD, hypertension, Bipolar disorder, who was referred to me by Johnny Garnette LABOR, MD on 09/15/23 for a evaluation of constipation. Patient last seen in GI office on 04/22/2021 by Dr. Federico to discuss constipation, dysphagia, weight loss, and colon cancer screening.  Interval History  Patient admits/denies GERD Patient taking pantoprazole  40 mg p.o. daily Patient admits/denies dysphagia Patient admits/denies nausea, vomiting, or weight loss  Patient admits/denies altered bowel habits Patient taking MiraLAX  p.o. daily Patient admits/denies abdominal pain Patient admits/denies rectal bleeding   Denies/Admits alcohol Denies/Admits smoking Denies/Admits NSAID use. Denies/Admits they are on blood thinners.  Patients last colonoscopy Patients last EGD  Surgical history:  Patient's family history includes  Oxygen  prn***  Wt Readings from Last 3 Encounters:  09/01/23 209 lb (94.8 kg)  08/13/23 210 lb (95.3 kg)  07/30/23 214 lb (97.1 kg)      Past Medical History:  Diagnosis Date   Arthritis    Bipolar disorder (HCC)    Chronic neck pain    had previously seen Dr. Talitha Repress at [facility]    COPD (chronic obstructive pulmonary disease) [facility])    sees Dr. Shelah    Depression    Fibromyalgia    Headache    Hypertension     Past Surgical History:  Procedure Laterality Date   COLONOSCOPY  08/29/2021   per Dr. Paulita Schirmer in [facility] [place], repeat in 10 yrs   FRACTURE SURGERY      Current Outpatient Medications  Medication Sig Dispense Refill   albuterol  (VENTOLIN  HFA) 108 (90 Base) MCG/ACT inhaler INHALE 2 PUFFS INTO THE LUNGS EVERY 6 HOURS AS NEEDED FOR WHEEZING OR SHORTNESS OF BREATH 8.5 g 3   atorvastatin  (LIPITOR) 10 MG tablet Take 1 tablet (10 mg total) by mouth daily. 90 tablet 3   budesonide -glycopyrrolate-formoterol   (BREZTRI AEROSPHERE) 160-9-4.8 MCG/ACT AERO inhaler Inhale 2 puffs into the lungs 2 (two) times daily.     buPROPion  (WELLBUTRIN  XL) 150 MG 24 hr tablet Take 1 tablet (150 mg total) by mouth daily.     clotrimazole -betamethasone  (LOTRISONE ) cream Apply 1 Application topically 2 (two) times daily. 45 g 5   docusate sodium  (COLACE) 100 MG capsule Take 1 capsule (100 mg total) by mouth 2 (two) times daily. 60 capsule 5   ELIQUIS  5 MG TABS tablet TAKE 1 TABLET BY MOUTH TWICE A DAY 60 tablet 11   ketoconazole  (NIZORAL ) 2 % cream To apply 2 times daily to yeast rash 45 g 3   ketoconazole  (NIZORAL ) 2 % cream Apply 1 application twice a day as needed for yeast rashes 45 g 3   levalbuterol  (XOPENEX ) 0.63 MG/3ML nebulizer solution Take 3 mLs (0.63 mg total) by nebulization every 4 (four) hours as needed for wheezing or shortness of breath. 810 mL 3   LORazepam  (ATIVAN ) 2 MG tablet TAKE 1 TABLET BY MOUTH UP TO EVERY 6 HOURS AS NEEDED FOR ANXIETY 120 tablet 5   losartan -hydrochlorothiazide  (HYZAAR) 100-25 MG tablet Take 0.5 tablets by mouth daily. 45 tablet 3   methocarbamol  (ROBAXIN ) 500 MG tablet TAKE 1 TABLET BY MOUTH EVERY 8 HOURS AS NEEDED FOR MUSCLE SPASMS. 90 tablet 5   ondansetron  (ZOFRAN ) 8 MG tablet Take 1 tablet (8 mg total) by mouth every 6 (six) hours as needed for nausea or vomiting. 60 tablet 5   oxyCODONE  (OXYCONTIN ) 40  mg 12 hr tablet Take 1 tablet (40 mg total) by mouth at bedtime. 30 tablet 0   oxycodone  (ROXICODONE ) 30 MG immediate release tablet Take 1 tablet (30 mg total) by mouth every 3 (three) hours as needed for pain. 150 tablet 0   OXYGEN  Place 3 L into the nose continuous.     pantoprazole  (PROTONIX ) 40 MG tablet Take 1 tablet (40 mg total) by mouth daily. 90 tablet 3   polyethylene glycol powder (GLYCOLAX /MIRALAX ) 17 GM/SCOOP powder Take 17 g by mouth 2 (two) times daily as needed. 3350 g 5   potassium chloride  (KLOR-CON ) 10 MEQ tablet Take 1 tablet (10 mEq total) by mouth daily. 90  tablet 3   prazosin  (MINIPRESS ) 2 MG capsule Take 1 capsule (2 mg total) by mouth at bedtime. (Patient taking differently: Take 2 mg by mouth at bedtime as needed (sleep).) 90 capsule 3   predniSONE  (DELTASONE ) 10 MG tablet Take 1 tablet (10 mg total) by mouth 2 (two) times daily with a meal. 180 tablet 3   pregabalin  (LYRICA ) 50 MG capsule Take one capsule in the morning and one in the afternoon 180 capsule 3   Psyllium 25 % POWD Take 1-2 teaspoons once daily with first meal of the day. 425 g 3   QUEtiapine  (SEROQUEL ) 25 MG tablet Take 1 tablet (25 mg total) by mouth at bedtime. 90 tablet 3   SUMAtriptan  (IMITREX ) 100 MG tablet TAKE 1 TABLET BY MOUTH AS NEEDED FOR MIGRAINE. Zennie Ayars REPEAT IN 2 HOURS IF HEADACHE PERSISTS OR RECURS. 9 tablet 2   topiramate  (TOPAMAX ) 50 MG tablet Take 1 tablet (50 mg total) by mouth daily. 90 tablet 3   No current facility-administered medications for this visit.    Allergies as of 11/09/2023 - Review Complete 09/01/2023  Allergen Reaction Noted   Ciprofloxacin  Other (See Comments) 01/02/2022   Codeine Nausea Only 08/27/2015   Latex Itching 06/11/2021    Family History  Problem Relation Age of Onset   Depression Mother    Hypertension Mother    Stroke Father    Hypertension Father     Review of Systems:    Constitutional: No weight loss, fever, chills, weakness or fatigue HEENT: Eyes: No change in vision               Ears, Nose, Throat:  No change in hearing or congestion Skin: No rash or itching Cardiovascular: No chest pain, chest pressure or palpitations   Respiratory: No SOB or cough Gastrointestinal: See HPI and otherwise negative Genitourinary: No dysuria or change in urinary frequency Neurological: No headache, dizziness or syncope Musculoskeletal: No new muscle or joint pain Hematologic: No bleeding or bruising Psychiatric: No history of depression or anxiety    Physical Exam:  Vital signs: There were no vitals taken for this  visit.  Constitutional:   Pleasant *** female/female appears to be in NAD, Well developed, Well nourished, alert and cooperative Eyes:   PEERL, EOMI. No icterus. Conjunctiva pink. Neck:  Supple Throat: Oral cavity and pharynx without inflammation, swelling or lesion.  Respiratory: Respirations even and unlabored. Lungs clear to auscultation bilaterally.   No wheezes, crackles, or rhonchi.  Cardiovascular: Normal S1, S2. Regular rate and rhythm. No peripheral edema, cyanosis or pallor.  Gastrointestinal:  Soft, nondistended, nontender. No rebound or guarding. Normal bowel sounds. No appreciable masses or hepatomegaly. Rectal:  Not performed.  Anoscopy: Msk:  Symmetrical without gross deformities. Without edema, no deformity or joint abnormality.  Neurologic:  Alert and  oriented x4;  grossly normal neurologically.  Skin:   Dry and intact without significant lesions or rashes.  RELEVANT LABS AND IMAGING: CBC    Latest Ref Rng & Units 07/30/2023   10:19 AM 01/27/2023    3:21 PM 02/26/2022    4:16 PM  CBC  WBC 4.0 - 10.5 K/uL 10.9  7.1  9.5   Hemoglobin 12.0 - 15.0 g/dL 86.4  86.7  86.4   Hematocrit 36.0 - 46.0 % 40.6  41.3  39.6   Platelets 150.0 - 400.0 K/uL 248.0  244  307      CMP     Latest Ref Rng & Units 07/30/2023   10:19 AM 01/27/2023    3:21 PM 02/26/2022    4:16 PM  CMP  Glucose 70 - 99 mg/dL 895  89  886   BUN 6 - 23 mg/dL 15  6  8    Creatinine 0.40 - 1.20 mg/dL 9.15  9.32  9.37   Sodium 135 - 145 mEq/L 141  142  142   Potassium 3.5 - 5.1 mEq/L 3.5  3.8  3.6   Chloride 96 - 112 mEq/L 105  106  111   CO2 19 - 32 mEq/L 27  23  22    Calcium  8.4 - 10.5 mg/dL 8.6  9.0  8.9   Total Protein 6.0 - 8.3 g/dL 6.7   7.4   Total Bilirubin 0.2 - 1.2 mg/dL 1.2   1.4   Alkaline Phos 39 - 117 U/L 77   65   AST 0 - 37 U/L 19   27   ALT 0 - 35 U/L 15   16      Lab Results  Component Value Date   TSH 3.53 07/30/2023  01/2022 echo-  Left ventricular ejection fraction, by  estimation, is 55%.   Assessment: 1. ***  Plan: 1. ***   Thank you for the courtesy of this consult. Please call me with any questions or concerns.   Esmae Donathan, FNP-C [facility] 11/09/2023, 11:47 AM  Cc: Johnny Garnette LABOR, MD

## 2023-11-09 NOTE — Telephone Encounter (Signed)
 Good afternoon Laurie Schaefer,   Patient states she is not able to make her appointment this afternoon at 2:00 due to taking a new medication and it has been making her dizzy and she does not feel comfortable driving.    Patient was rescheduled for 9/24 at 2:00

## 2023-11-09 NOTE — Telephone Encounter (Signed)
 She actually needs another PMV now (her last was on 08-13-23). I can send in refills at that visit

## 2023-11-10 ENCOUNTER — Other Ambulatory Visit: Payer: Self-pay | Admitting: Family Medicine

## 2023-11-15 ENCOUNTER — Telehealth: Payer: Self-pay | Admitting: *Deleted

## 2023-11-15 ENCOUNTER — Other Ambulatory Visit: Payer: Self-pay | Admitting: Family Medicine

## 2023-11-15 DIAGNOSIS — G43809 Other migraine, not intractable, without status migrainosus: Secondary | ICD-10-CM

## 2023-11-15 NOTE — Telephone Encounter (Signed)
 Pt has appointment scheduled with PCP on 11/16/23

## 2023-11-15 NOTE — Telephone Encounter (Signed)
 Copied from CRM [id number]. Topic: Clinical - Prescription Issue >> Nov 15, 2023  8:45 AM Vena HERO wrote: Reason for CRM: Pt called in and states that she has been waiting for an update on her refill request and trying to get her oxycodone  refilled for weeks.She states she cannot get the 40mg  anywhere because no where carries it so she would assistance finding a pharmacy that does. She will take the 30mg  if she cannot get the other because she needs something to help the pain but the 30mg  does not help as much as it used to. I informed pt that she has an appt tomorrow and she is required to show up because she wanted to get the refill and cancel the appt. Please advise pt what's going on with her refill request at [phone], thank you

## 2023-11-16 ENCOUNTER — Ambulatory Visit: Admitting: Family Medicine

## 2023-11-17 ENCOUNTER — Other Ambulatory Visit

## 2023-11-19 ENCOUNTER — Encounter: Payer: Self-pay | Admitting: Family Medicine

## 2023-11-19 ENCOUNTER — Ambulatory Visit: Admitting: Family Medicine

## 2023-11-19 VITALS — BP 110/78 | HR 99 | Temp 98.1°F | Wt 217.0 lb

## 2023-11-19 DIAGNOSIS — G43809 Other migraine, not intractable, without status migrainosus: Secondary | ICD-10-CM | POA: Diagnosis not present

## 2023-11-19 DIAGNOSIS — K5903 Drug induced constipation: Secondary | ICD-10-CM | POA: Diagnosis not present

## 2023-11-19 DIAGNOSIS — M797 Fibromyalgia: Secondary | ICD-10-CM

## 2023-11-19 DIAGNOSIS — K5900 Constipation, unspecified: Secondary | ICD-10-CM | POA: Insufficient documentation

## 2023-11-19 DIAGNOSIS — G8929 Other chronic pain: Secondary | ICD-10-CM | POA: Diagnosis not present

## 2023-11-19 MED ORDER — LUBIPROSTONE 24 MCG PO CAPS: 24.0000 ug | ORAL_CAPSULE | Freq: Two times a day (BID) | ORAL | 0 refills | Status: DC

## 2023-11-19 MED ORDER — HYDROMORPHONE HCL 8 MG PO TABS: 8.0000 mg | ORAL_TABLET | Freq: Four times a day (QID) | ORAL | 0 refills | Status: DC | PRN

## 2023-11-19 MED ORDER — TOPIRAMATE 100 MG PO TABS: 100.0000 mg | ORAL_TABLET | Freq: Every day | ORAL | 3 refills | Status: AC

## 2023-11-19 NOTE — Progress Notes (Signed)
   Subjective:    Patient ID: Laurie Schaefer, female    DOB: [birth year], 68 y.o.   MRN: [id number]  HPI Here for pain management. She has been unhappy with Oxycodone  in that it does not give her adequate pain relief and it causes her to have severe constipation. She is currently taking Colace BID and Miralax  BID, but she still averages one BM every 3-4 days and she has to force the stools out. In addition, her migraines have been coming more frequently. The Topiramate  worked well when she started on it a few years ago, but not lately.    Review of Systems  Constitutional: Negative.   Respiratory: Negative.    Cardiovascular: Negative.   Gastrointestinal:  Positive for constipation.  Musculoskeletal:  Positive for myalgias.  Neurological:  Positive for headaches.       Objective:   Physical Exam Constitutional:      General: She is not in acute distress. Neurological:     Mental Status: She is alert.           Assessment & Plan:  Pain management. Indication for chronic opioid: fibromyalgia  Medication and dose: Hydromorphone  8 mg  # pills per month: 120 Last UDS date: 08-13-23 Opioid Treatment Agreement signed (Y/N): 01-15-20 Opioid Treatment Agreement last reviewed with patient:  11-19-23 [facility] reviewed this encounter (include red flags): Yes For her fibromyalgia pain, we will stop the Oxycodone  and the Oxycontin , and instead she will try Hydromorphone  8 mg every 6 hours as needed. For the constipation, we will add Amitiza  24 mg BID. For the migraine prophylaxis, we will in crease the Topiramate  to 100 mg daily at bedtime. Report back in 3-4 weeks.  Garnette Olmsted, MD

## 2023-11-29 ENCOUNTER — Other Ambulatory Visit

## 2023-11-29 ENCOUNTER — Telehealth: Payer: Self-pay

## 2023-11-29 NOTE — Progress Notes (Signed)
   11/29/2023  Patient ID: Laurie Schaefer, female   DOB: 08-18-1955, 68 y.o.   MRN: [id number]  Attempted to contact patient for scheduled appointment for medication management. Primary number says no longer in service. Home phone says call not be completed at this time. Unable to leave voicemail.  [name], PharmD Clinical Pharmacist [phone]

## 2023-12-01 ENCOUNTER — Ambulatory Visit (INDEPENDENT_AMBULATORY_CARE_PROVIDER_SITE_OTHER)
Admission: RE | Admit: 2023-12-01 | Discharge: 2023-12-01 | Disposition: A | Discharge status: Home / Self Care | Source: Ambulatory Visit | Attending: Gastroenterology | Admitting: Gastroenterology

## 2023-12-01 ENCOUNTER — Encounter: Payer: Self-pay | Admitting: Gastroenterology

## 2023-12-01 ENCOUNTER — Ambulatory Visit (INDEPENDENT_AMBULATORY_CARE_PROVIDER_SITE_OTHER): Admitting: Gastroenterology

## 2023-12-01 VITALS — BP 160/100 | HR 99 | Ht 65.0 in | Wt 213.0 lb

## 2023-12-01 DIAGNOSIS — Z860100 Personal history of colon polyps, unspecified: Secondary | ICD-10-CM

## 2023-12-01 DIAGNOSIS — R110 Nausea: Secondary | ICD-10-CM

## 2023-12-01 DIAGNOSIS — T402X5A Adverse effect of other opioids, initial encounter: Secondary | ICD-10-CM | POA: Diagnosis not present

## 2023-12-01 DIAGNOSIS — Z79891 Long term (current) use of opiate analgesic: Secondary | ICD-10-CM | POA: Diagnosis not present

## 2023-12-01 DIAGNOSIS — K5903 Drug induced constipation: Secondary | ICD-10-CM | POA: Diagnosis not present

## 2023-12-01 DIAGNOSIS — R140 Abdominal distension (gaseous): Secondary | ICD-10-CM | POA: Diagnosis not present

## 2023-12-01 DIAGNOSIS — K5909 Other constipation: Secondary | ICD-10-CM | POA: Diagnosis not present

## 2023-12-01 IMAGING — CR DG CHEST 2V
2 series · 2 of 2 positions shown · non-contrast
Comparison: 12/05/2020

CLINICAL DATA: Chest pain.

EXAM:
CHEST - 2 VIEW

[w chest pa]
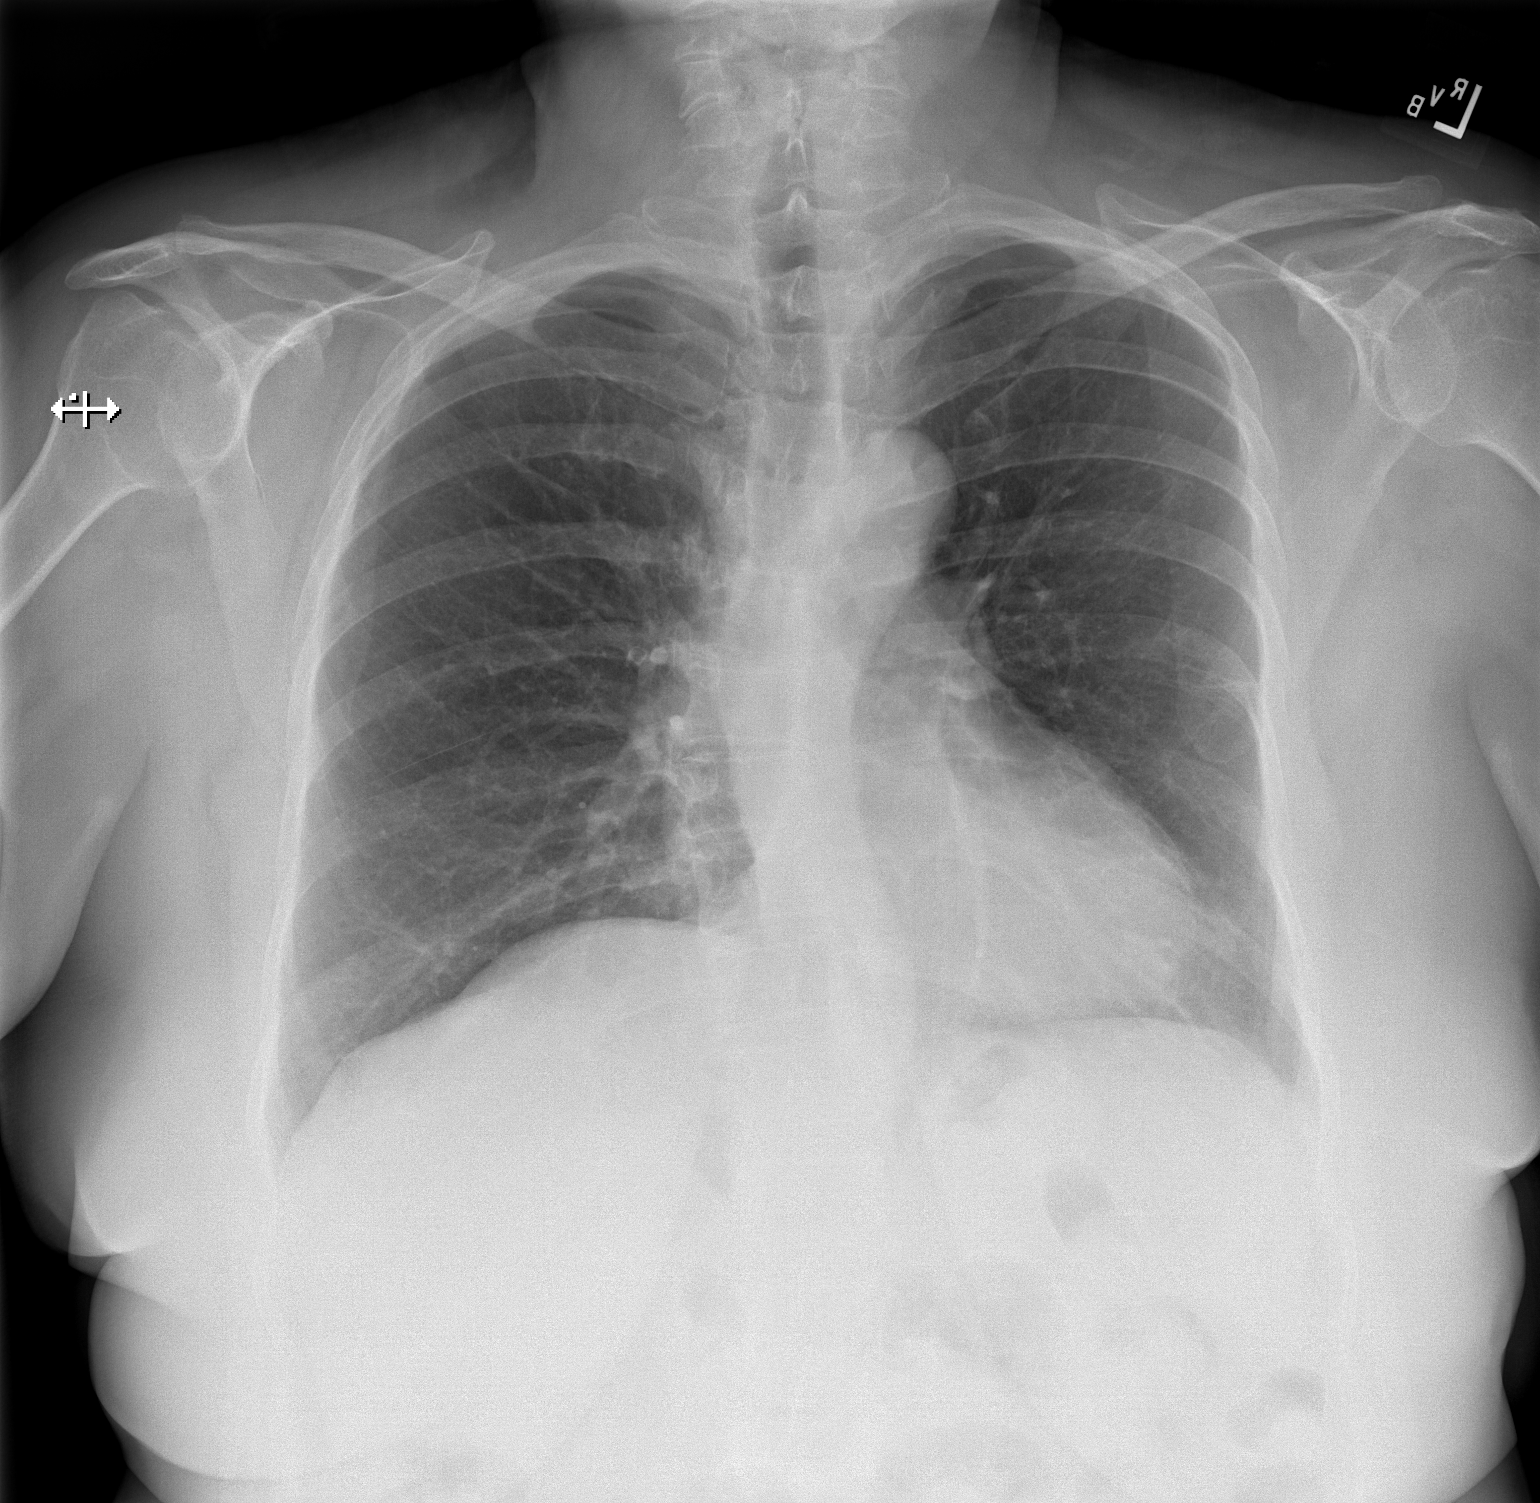

[w chest lat]
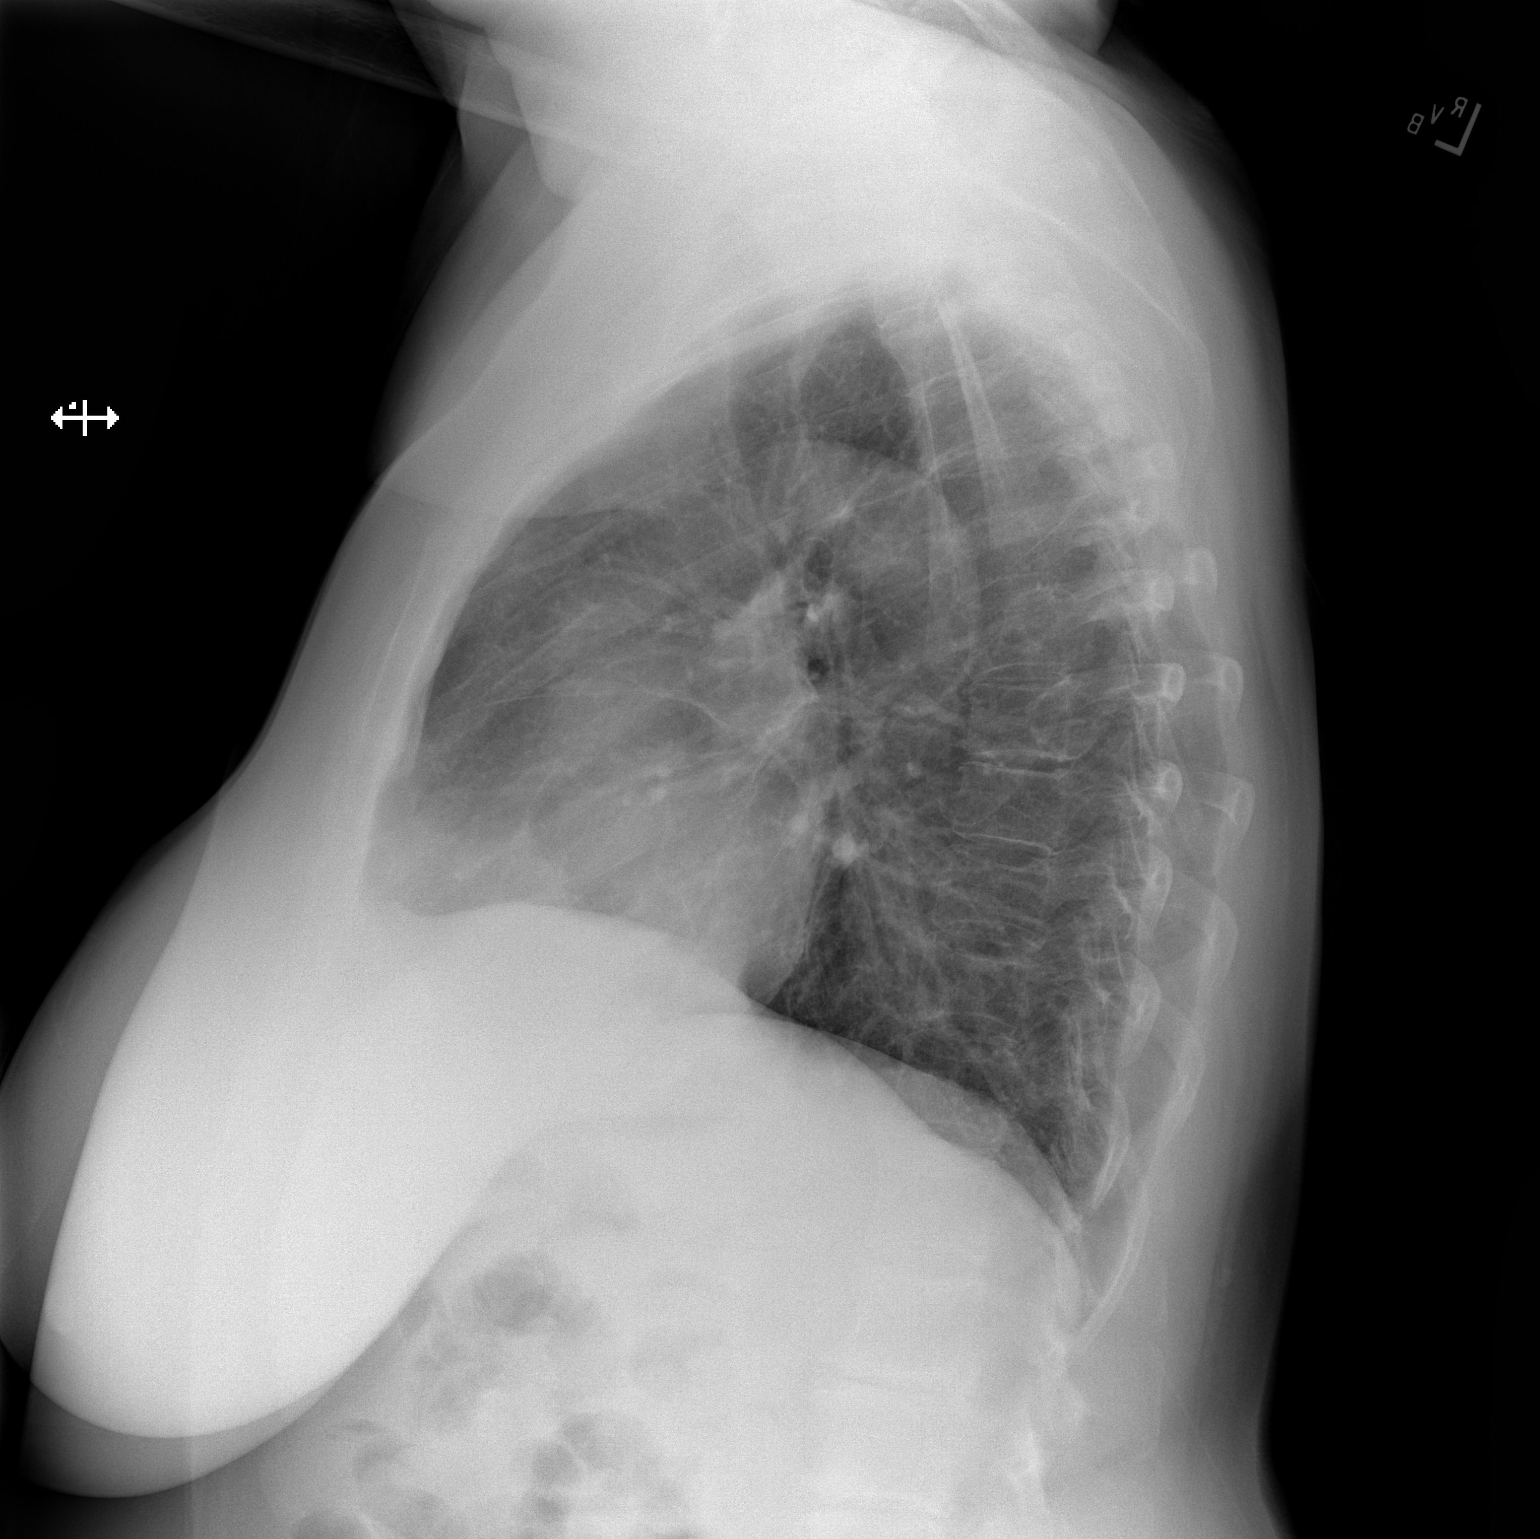

[2 of 2 positions shown; findings below may reference images not displayed]

FINDINGS: The cardiomediastinal silhouette is unchanged with normal heart
size. Minimal left mid lung scarring is unchanged. No acute airspace
consolidation, edema, pleural effusion, pneumothorax is identified.
Mild thoracic dextroscoliosis is noted.
IMPRESSION: No active cardiopulmonary disease.

## 2023-12-01 NOTE — Patient Instructions (Addendum)
 Constipation Samples of Linzess  290 mcg po daily, take 1 capsule 30-45 minutes before first meal of day with full glass of water   Recommend diet below:  The following suggestions can help minimize symptoms: 1. Drink enough fluids to prevent dehydration. Dehydration can increase symptoms of nausea. Sip  liquids steadily throughout the day; don't gulp. For most adults, fluid needs are 6-10 cups or  1500-2400 ml per day. Your fluid needs are _____________. Liquids can pass through the  stomach more easily and quickly than solids. Liquid nutritional supplements such as Ensure or  Boost may help you achieve adequate calories and protein. 2. Eat small, frequent meals. Many people find that frequent small meals (5-6 or more per day)  produce fewer symptoms than large meals.  3. Eat nutritious foods first before filling up on snacks or empty calories. Some people find they  tolerate solids better earlier in the day. Start with solids earlier in day and finish with light or liquid  meal in the evening.  4. Reduce fat intake. Fat naturally slows stomach emptying. Consuming foods labeled "low fat,"  "nonfat," or "fat-free" may help with symptoms. Avoid all high fat, fried or greasy foods.  Liquid fat in beverages, however, is often tolerated and is encouraged if you are experiencing  unintentional weight loss. Liquid fat = fats and oils that remain liquid even when refrigerated,  such as vegetable oils, cream, non-dairy cream, half-and-half. 5. Reduce fiber intake. Fiber slows stomach emptying. High-fiber foods should be avoided because  they may remain in your stomach or may cause bezoar formation. A bezoar is a mixture of food  fibers that may cause a blockage in your stomach and prevent it from being able to empty well. A  bezoar is similar to a hairball in a cat. Refer to the table below on foods to avoid. 6. Chew foods well. Chew all food to a mashed potato or pudding consistency. Solid foods  such as  meat may be tolerated if ground or pureed. If you need to puree your food, many foods can be  liquefied in a blender or food processor, but solid foods will need to be cut in pieces and thinned  with some type of liquid.  7. Sit up while eating 8.. Alcohol should be avoided,  Your provider has requested that you have an abdominal x ray before leaving today. Please go to the basement floor to our Radiology department for the test.  _______________________________________________________  If your blood pressure at your visit was 140/90 or greater, please contact your primary care physician to follow up on this.  _______________________________________________________  If you are age 70 or older, your body mass index should be between 23-30. Your Body mass index is 35.45 kg/m. If this is out of the aforementioned range listed, please consider follow up with your Primary Care Provider.  If you are age 70 or younger, your body mass index should be between 19-25. Your Body mass index is 35.45 kg/m. If this is out of the aformentioned range listed, please consider follow up with your Primary Care Provider.   ________________________________________________________  The [facility] providers would like to encourage you to use MYCHART to communicate with providers for non-urgent requests or questions.  Due to long hold times on the telephone, sending your provider a message by [facility] may be a faster and more efficient way to get a response.  Please allow 48 business hours for a response.  Please remember that this is for  non-urgent requests.  _______________________________________________________  [facility] is using a team-based approach to care.  Your team is made up of your doctor and two to three APPS. Our APPS (Nurse Practitioners and Physician Assistants) work with your physician to ensure care continuity for you. They are fully qualified to address your health concerns  and develop a treatment plan. They communicate directly with your gastroenterologist to care for you. Seeing the Advanced Practice Practitioners on your physician's team can help you by facilitating care more promptly, often allowing for earlier appointments, access to diagnostic testing, procedures, and other specialty referrals.   Thank you for trusting me with your gastrointestinal care. Deanna May, FNP-C

## 2023-12-01 NOTE — Progress Notes (Signed)
 Chief Complaint:constipation Primary GI Doctor: Laurie Schaefer   HPI:  Patient is a  68  year old female patient with past medical history of COPD, hypertension, and Fibromyalgia, who was referred to me by Laurie Schaefer on 09/15/23 for a evaluation of constipation .   Patient last seen in the GI office on 04/22/2021 by Laurie Schaefer for colon screening cancer screening. Colonoscopy was scheduled but later cancelled.  Interval History     Patient presents for evaluation for opioid induced constipation and nausea. Accompanied by her husband.  Patient reports she has had issues with chronic constipation not relieved with over-the-counter laxatives and stool softeners. She reports she was on oxycodone  for fibromyalgia and this seems to make her constipation worse. She was recently switched to hydromorphone  but reports it is not controlling her pain therefore she is going to talk to Dr. Johnny about adjustments. She enquires how this affects her constipation.  Patient was prescribed Amitiza  24 mcg twice daily.  Patient reports she took her first dose yesterday and had severe abdominal cramping and diarrhea along with profuse sweating. She has not taken a dose today and enquires about other options.  She reports she is straining a lot with bowel movements.    She also complains of chronic nausea for past year or more. She is using ondansetron  prn which works well. She reports she grazes throughout the day. She drinks Boost shake once daily. She reports she has a lot of phlegm which makes her gag. She takes OTC Mucinex. Weight stable.  She uses oxygen  about 2 L as needed due to her history of COPD.   Patients had colonoscopy approximately 12 + years ago in [place], [place]. Laurie Schaefer She was told that she had benign polyps at that time.   Patient had colonoscopy with GI doctor in [facility] within past two years and had one benign polyp.   Wt Readings from Last 3 Encounters:  12/01/23 213 lb (96.6 kg)  11/19/23 217 lb  (98.4 kg)  09/01/23 209 lb (94.8 kg)    Past Medical History:  Diagnosis Date   Arthritis    Bipolar disorder (HCC)    Chronic neck pain    had previously seen Laurie Schaefer at [facility]    COPD (chronic obstructive pulmonary disease) Laurie Schaefer)    sees Laurie Schaefer    Depression    Fibromyalgia    Headache    Hypertension     Past Surgical History:  Procedure Laterality Date   COLONOSCOPY  08/29/2021   per Laurie Schaefer in [facility] [place], repeat in 10 yrs   FRACTURE SURGERY      Current Outpatient Medications  Medication Sig Dispense Refill   albuterol  (VENTOLIN  HFA) 108 (90 Base) MCG/ACT inhaler INHALE 2 PUFFS INTO THE LUNGS EVERY 6 HOURS AS NEEDED FOR WHEEZING OR SHORTNESS OF BREATH 8.5 g 3   atorvastatin  (LIPITOR) 10 MG tablet Take 1 tablet (10 mg total) by mouth daily. 90 tablet 3   budesonide -glycopyrrolate-formoterol  (BREZTRI AEROSPHERE) 160-9-4.8 MCG/ACT AERO inhaler Inhale 2 puffs into the lungs 2 (two) times daily.     clotrimazole -betamethasone  (LOTRISONE ) cream Apply 1 Application topically 2 (two) times daily. 45 g 5   docusate sodium  (COLACE) 100 MG capsule Take 1 capsule (100 mg total) by mouth 2 (two) times daily. 60 capsule 5   ELIQUIS  5 MG TABS tablet TAKE 1 TABLET BY MOUTH TWICE A DAY 60 tablet 11   HYDROmorphone  (DILAUDID ) 8  MG tablet Take 1 tablet (8 mg total) by mouth every 6 (six) hours as needed for moderate pain (pain score 4-6). 120 tablet 0   ketoconazole  (NIZORAL ) 2 % cream To apply 2 times daily to yeast rash 45 g 3   ketoconazole  (NIZORAL ) 2 % cream Apply 1 application twice a day as needed for yeast rashes 45 g 3   levalbuterol  (XOPENEX ) 0.63 MG/3ML nebulizer solution Take 3 mLs (0.63 mg total) by nebulization every 4 (four) hours as needed for wheezing or shortness of breath. 810 mL 3   LORazepam  (ATIVAN ) 2 MG tablet TAKE 1 TABLET BY MOUTH UP TO EVERY 6 HOURS AS NEEDED FOR ANXIETY 120 tablet 5   losartan -hydrochlorothiazide  (HYZAAR) 100-25 MG  tablet Take 0.5 tablets by mouth daily. 45 tablet 3   lubiprostone  (AMITIZA ) 24 MCG capsule Take 1 capsule (24 mcg total) by mouth 2 (two) times daily with a meal. 60 capsule 0   methocarbamol  (ROBAXIN ) 500 MG tablet TAKE 1 TABLET BY MOUTH EVERY 8 HOURS AS NEEDED FOR MUSCLE SPASMS. 90 tablet 5   ondansetron  (ZOFRAN ) 8 MG tablet Take 1 tablet (8 mg total) by mouth every 6 (six) hours as needed for nausea or vomiting. 60 tablet 5   OXYGEN  Place 3 L into the nose continuous.     pantoprazole  (PROTONIX ) 40 MG tablet Take 1 tablet (40 mg total) by mouth daily. 90 tablet 3   polyethylene glycol powder (GLYCOLAX /MIRALAX ) 17 GM/SCOOP powder Take 17 g by mouth 2 (two) times daily as needed. 3350 g 5   potassium chloride  (KLOR-CON ) 10 MEQ tablet Take 1 tablet (10 mEq total) by mouth daily. 90 tablet 3   prazosin  (MINIPRESS ) 2 MG capsule Take 1 capsule (2 mg total) by mouth at bedtime. (Patient taking differently: Take 2 mg by mouth at bedtime as needed (sleep).) 90 capsule 3   predniSONE  (DELTASONE ) 10 MG tablet Take 1 tablet (10 mg total) by mouth 2 (two) times daily with a meal. 180 tablet 3   pregabalin  (LYRICA ) 50 MG capsule Take one capsule in the morning and one in the afternoon 180 capsule 3   Psyllium 25 % POWD Take 1-2 teaspoons once daily with first meal of the day. 425 g 3   QUEtiapine  (SEROQUEL ) 25 MG tablet Take 1 tablet (25 mg total) by mouth at bedtime. 90 tablet 3   SUMAtriptan  (IMITREX ) 100 MG tablet TAKE 1 TABLET BY MOUTH AS NEEDED FOR MIGRAINE. Laurie Schaefer REPEAT IN 2 HOURS IF HEADACHE PERSISTS OR RECURS. 9 tablet 5   topiramate  (TOPAMAX ) 100 MG tablet Take 1 tablet (100 mg total) by mouth daily. 90 tablet 3   buPROPion  (WELLBUTRIN  XL) 150 MG 24 hr tablet Take 1 tablet (150 mg total) by mouth daily. (Patient not taking: Reported on 12/01/2023)     No current facility-administered medications for this visit.    Allergies as of 12/01/2023 - Review Complete 12/01/2023  Allergen Reaction Noted    Ciprofloxacin  Other (See Comments) 01/02/2022   Codeine Nausea Only 08/27/2015   Latex Itching 06/11/2021    Family History  Problem Relation Age of Onset   Depression Mother    Hypertension Mother    Stroke Father    Hypertension Father     Review of Systems:    Constitutional: No weight loss, fever, chills, weakness or fatigue HEENT: Eyes: No change in vision               Ears, Nose, Throat:  No change in hearing  or congestion Skin: No rash or itching Cardiovascular: No chest pain, chest pressure or palpitations   Respiratory: No SOB or cough Gastrointestinal: See HPI and otherwise negative Genitourinary: No dysuria or change in urinary frequency Neurological: No headache, dizziness or syncope Musculoskeletal: No new muscle or joint pain Hematologic: No bleeding or bruising Psychiatric: No history of depression or anxiety    Physical Exam:  Vital signs: BP (!) 160/100   Pulse 99   Ht 5' 5 (1.651 m)   Wt 213 lb (96.6 kg)   BMI 35.45 kg/m   Constitutional:   Pleasant  female appears to be in NAD, Well developed, Well nourished, alert and cooperative Throat: Oral cavity and pharynx without inflammation, swelling or lesion.  Respiratory: Respirations even and unlabored. Lung sounds decreased bilaterally.  O2 oxygen  on. Cardiovascular: Normal S1, S2. Regular rate and rhythm. No peripheral edema, cyanosis or pallor.  Gastrointestinal:  Soft, nondistended, generalized abdominal tenderness. No rebound or guarding. Hypoactive bowel sounds. No appreciable masses or hepatomegaly. Rectal:  Not performed.  Msk:  Symmetrical without gross deformities. Without edema, no deformity or joint abnormality.  Neurologic:  Alert and  oriented x4;  grossly normal neurologically.  Skin:   Dry and intact without significant lesions or rashes.  RELEVANT LABS AND IMAGING: CBC    Latest Ref Rng & Units 07/30/2023   10:19 AM 01/27/2023    3:21 PM 02/26/2022    4:16 PM  CBC  WBC 4.0 - 10.5  K/uL 10.9  7.1  9.5   Hemoglobin 12.0 - 15.0 g/dL 86.4  86.7  86.4   Hematocrit 36.0 - 46.0 % 40.6  41.3  39.6   Platelets 150.0 - 400.0 K/uL 248.0  244  307      CMP     Latest Ref Rng & Units 07/30/2023   10:19 AM 01/27/2023    3:21 PM 02/26/2022    4:16 PM  CMP  Glucose 70 - 99 mg/dL 895  89  886   BUN 6 - 23 mg/dL 15  6  8    Creatinine 0.40 - 1.20 mg/dL 9.15  9.32  9.37   Sodium 135 - 145 mEq/L 141  142  142   Potassium 3.5 - 5.1 mEq/L 3.5  3.8  3.6   Chloride 96 - 112 mEq/L 105  106  111   CO2 19 - 32 mEq/L 27  23  22    Calcium  8.4 - 10.5 mg/dL 8.6  9.0  8.9   Total Protein 6.0 - 8.3 g/dL 6.7   7.4   Total Bilirubin 0.2 - 1.2 mg/dL 1.2   1.4   Alkaline Phos 39 - 117 U/L 77   65   AST 0 - 37 U/L 19   27   ALT 0 - 35 U/L 15   16      Lab Results  Component Value Date   TSH 3.53 07/30/2023  Labs 02/2021: BMP with mildly low K of 3.4. CBC unremarkable.   Labs 11/2020: LFTs unremarkable. TSH nml.   CT A/P w/contrast 01/15/20: IMPRESSION: 1. No acute finding. 2. Hepatic steatosis and probable cholelithiasis. 3. 2.7 cm right ovarian cyst which has a simple CT appearance.   Assessment: Encounter Diagnoses  Name Primary?   Opioid-induced constipation Yes   Chronic nausea    History of colonic polyps      68 year old female patient that presents with drug-induced constipation not relieved with over-the-counter laxatives and stool softeners. We did discuss how narcotics can decrease  motility and cause issues with constipation.  Patient was given prescription for Amitiza  24 mcg p.o. twice daily which she took 1 dose yesterday and reports it caused severe abdominal pain with diarrhea.  Will go ahead and give patient samples of pro secretory agent Linzess  290 mcg po daily. Will order xray today to r/o obstruction.     Patient also complains of chronic nausea controlled with antiemetics as needed.  Recommended she follow a gastroparesis diet along with small meals spread  throughout the day, no late meals.  Patient complains of phlegm in the back of her throat that causes her to dry heave.  Recommended she could consider ENT evaluation. She does not wish to pursue any endoscopic procedures at this time.    Patient reports she had a colonoscopy in the last 2 years for hx of polyps however unable to remember what location or doctor.  Will try to obtain records.  Plan: -recommended gastroparesis diet -antiemetics as needed -abdominal xray 2 view -samples of Linzess  290 mcg provided -Cathern Tahir consider motegrity or movantik if no improvement  Thank you for the courtesy of this consult. Please call me with any questions or concerns.   Shykeria Sakamoto, FNP-C [facility] 12/01/2023, 4:45 PM  Cc: Laurie Schaefer

## 2023-12-02 ENCOUNTER — Telehealth: Payer: Self-pay | Admitting: *Deleted

## 2023-12-02 ENCOUNTER — Ambulatory Visit: Payer: Self-pay | Admitting: Gastroenterology

## 2023-12-02 ENCOUNTER — Other Ambulatory Visit: Payer: Self-pay | Admitting: Family Medicine

## 2023-12-02 NOTE — Telephone Encounter (Signed)
 Attempted to return Stacy with [employer] call but kept getting a busy signal will keep trying

## 2023-12-02 NOTE — Telephone Encounter (Unsigned)
 Copied from CRM [id number]. Topic: Clinical - Medication Refill >> Dec 02, 2023  3:11 PM Laurie Schaefer wrote: Medication: QUEtiapine  (SEROQUEL ) 25 MG tablet  Has the patient contacted their pharmacy? Yes, need updated rx.  This is the patient's preferred pharmacy:  [facility] [place], [place] - [address] [place] [address] Phone: [phone] Fax: [phone]   Is this the correct pharmacy for this prescription? Yes If no, delete pharmacy and type the correct one.   Has the prescription been filled recently? Yes  Is the patient out of the medication? No, has a few left.   Has the patient been seen for an appointment in the last year OR does the patient have an upcoming appointment? Yes, seen 09/12.  Can we respond through MyChart? Yes  Agent: Please be advised that Rx refills may take up to 3 business days. We ask that you follow-up with your pharmacy.

## 2023-12-02 NOTE — Telephone Encounter (Signed)
 Copied from CRM #[id number]. Topic: Clinical - Medication Question >> Dec 02, 2023 11:37 AM Revonda D wrote: Reason for CRM: Stacy with [facility] stated that she see's that the pt is COPD but not on any maintenance inhaler. Glade would like to verify if the pt is being provided samples from the office or in an assistance program. CB [phone].

## 2023-12-02 NOTE — Progress Notes (Signed)
 ____________________________________________________________  Attending physician addendum:  Thank you for sending this case to me. I have reviewed the entire note and agree with the plan.  In my opinion, Movantik would be the next choice for OIC if the Linzess  is either insufficiently effective or not tolerated.  Victory Brand, MD  ____________________________________________________________

## 2023-12-07 NOTE — Progress Notes (Signed)
   12/07/2023  Patient ID: Laurie Schaefer, female   DOB: [birth year], 68 y.o.   MRN: [id number]  Pharmacy Quality Measure Review  This patient is appearing on a report for being at risk of failing the adherence measure for cholesterol (statin) medications this calendar year.   Medication: Atorvastatin  Last fill date: 10/09/23 for 90 day supply  Insurance report was not up to date. No action needed at this time.   [name], PharmD Clinical Pharmacist [phone]

## 2023-12-13 ENCOUNTER — Encounter: Payer: Self-pay | Admitting: Family Medicine

## 2023-12-13 ENCOUNTER — Other Ambulatory Visit: Payer: Self-pay

## 2023-12-13 MED ORDER — LUBIPROSTONE 24 MCG PO CAPS: 24.0000 ug | ORAL_CAPSULE | Freq: Two times a day (BID) | ORAL | 0 refills | Status: DC

## 2023-12-14 NOTE — Telephone Encounter (Signed)
 Set up another PMV so Pam and I can discuss this

## 2023-12-17 NOTE — Telephone Encounter (Signed)
 Attempted several times to speak with agent with no success.

## 2023-12-22 ENCOUNTER — Other Ambulatory Visit (HOSPITAL_COMMUNITY): Payer: Self-pay

## 2023-12-22 ENCOUNTER — Ambulatory Visit: Admitting: Family Medicine

## 2023-12-22 ENCOUNTER — Telehealth: Payer: Self-pay

## 2023-12-22 ENCOUNTER — Encounter: Payer: Self-pay | Admitting: Family Medicine

## 2023-12-22 VITALS — BP 108/72 | HR 99 | Temp 97.9°F | Wt 211.0 lb

## 2023-12-22 DIAGNOSIS — R0989 Other specified symptoms and signs involving the circulatory and respiratory systems: Secondary | ICD-10-CM

## 2023-12-22 DIAGNOSIS — J209 Acute bronchitis, unspecified: Secondary | ICD-10-CM | POA: Diagnosis not present

## 2023-12-22 LAB — POC COVID19 BINAXNOW: SARS Coronavirus 2 Ag: NEGATIVE

## 2023-12-22 LAB — POCT INFLUENZA A/B
Influenza A, POC: NEGATIVE
Influenza B, POC: NEGATIVE

## 2023-12-22 MED ORDER — OXYCODONE HCL ER 40 MG PO T12A: 40.0000 mg | EXTENDED_RELEASE_TABLET | Freq: Every day | ORAL | 0 refills | Status: DC

## 2023-12-22 MED ORDER — METHYLPREDNISOLONE ACETATE 40 MG/ML IJ SUSP
40.0000 mg | Freq: Once | INTRAMUSCULAR | Status: AC
  Administered 2023-12-22: 40 mg via INTRAMUSCULAR

## 2023-12-22 MED ORDER — OXYCODONE HCL 30 MG PO TABS: 30.0000 mg | ORAL_TABLET | ORAL | 0 refills | Status: DC | PRN

## 2023-12-22 MED ORDER — AMOXICILLIN-POT CLAVULANATE 875-125 MG PO TABS: 1.0000 | ORAL_TABLET | Freq: Two times a day (BID) | ORAL | 0 refills | Status: DC

## 2023-12-22 MED ORDER — METHYLPREDNISOLONE ACETATE 80 MG/ML IJ SUSP
80.0000 mg | Freq: Once | INTRAMUSCULAR | Status: AC
  Administered 2023-12-22: 80 mg via INTRAMUSCULAR

## 2023-12-22 NOTE — Telephone Encounter (Signed)
 Pharmacy Patient Advocate Encounter   Received notification from Onbase that prior authorization for Oxycontin  40 ER tabs is required/requested.   Insurance verification completed.   The patient is insured through [employer].   Per test claim: PA required; PA started via CoverMyMeds. KEY [id number] . Waiting for clinical questions to populate.

## 2023-12-22 NOTE — Addendum Note (Signed)
 Addended by: LADONNA INOCENTE SAILOR on: 12/22/2023 02:22 PM   Modules accepted: Orders

## 2023-12-22 NOTE — Telephone Encounter (Signed)
 Clinical questions have been answered and PA submitted. PA currently Pending. Please be advised that most companies allow up to 30 days to make a decision. We will advise when a determination has been made, or follow up in 1 week.   Please reach out to our team, Rx Prior Auth Pool, if you haven't heard back in a week.

## 2023-12-22 NOTE — Progress Notes (Signed)
   Subjective:    Patient ID: Laurie Schaefer, female    DOB: 04/28/1955, 68 y.o.   MRN: [id number]  HPI Here for 3 days of chest congestion, coughing up yellow sputum, and fever. Using her regular oxygen  and inhalers. Also at her PMV a month ago, she asked to try Hydromorphone  instead of Oxycodone , but this does not give her adequate pain relief. She now asks to go back on Oxycodone .    Review of Systems  Constitutional:  Positive for fever.  HENT: Negative.    Eyes: Negative.   Respiratory:  Positive for cough. Negative for shortness of breath.        Objective:   Physical Exam Constitutional:      General: She is not in acute distress.    Appearance: Normal appearance.  HENT:     Right Ear: Tympanic membrane, ear canal and external ear normal.     Left Ear: Tympanic membrane, ear canal and external ear normal.     Nose: Nose normal.     Mouth/Throat:     Pharynx: Oropharynx is clear.  Eyes:     Conjunctiva/sclera: Conjunctivae normal.  Pulmonary:     Effort: Pulmonary effort is normal.     Breath sounds: Rhonchi present. No wheezing or rales.  Lymphadenopathy:     Cervical: No cervical adenopathy.  Neurological:     Mental Status: She is alert.           Assessment & Plan:  She has a bronchitis, and we will treat with a DepoMedrol shot and 10 days of Augmentin . For her pain control, we will stop the Hydromorphone  and go back on Oxycodone  30 mg and Oxycontin  40 mg.  Garnette Olmsted, MD

## 2023-12-22 NOTE — Addendum Note (Signed)
 Addended by: LADONNA INOCENTE SAILOR on: 12/22/2023 02:56 PM   Modules accepted: Orders

## 2023-12-23 ENCOUNTER — Other Ambulatory Visit (HOSPITAL_COMMUNITY): Payer: Self-pay

## 2023-12-23 NOTE — Telephone Encounter (Signed)
 Pharmacy Patient Advocate Encounter  Received notification from [employer] that Prior Authorization for Oxycontin  40 ER has been APPROVED from 03/10/23 to 03/08/25. Ran test claim, Copay is $229.76. This test claim was processed through [facility]- copay amounts may vary at other pharmacies due to pharmacy/plan contracts, or as the patient moves through the different stages of their insurance plan.   PA #/Case ID/Reference #: # [id number]

## 2024-02-18 ENCOUNTER — Ambulatory Visit: Payer: Self-pay

## 2024-02-18 ENCOUNTER — Ambulatory Visit: Admitting: Family Medicine

## 2024-02-18 ENCOUNTER — Other Ambulatory Visit: Payer: Self-pay | Admitting: Family Medicine

## 2024-02-18 NOTE — Telephone Encounter (Signed)
 Copied from CRM #[id number]. Topic: Clinical - Medication Refill >> Feb 18, 2024  4:10 PM Leah W wrote: Medication:  LORazepam  (ATIVAN ) 2 MG tablet    Has the patient contacted their pharmacy? No (Agent: If no, request that the patient contact the pharmacy for the refill. If patient does not wish to contact the pharmacy document the reason why and proceed with request.) (Agent: If yes, when and what did the pharmacy advise?)  This is the patient's preferred pharmacy:  [facility] [place], [place] - [address] [place] [address] Phone: [phone] Fax: [phone]  Is this the correct pharmacy for this prescription? Yes If no, delete pharmacy and type the correct one.   Has the prescription been filled recently? No  Is the patient out of the medication? No, 2 tabs left  Has the patient been seen for an appointment in the last year OR does the patient have an upcoming appointment? Yes  Can we respond through MyChart? Yes  Agent: Please be advised that Rx refills may take up to 3 business days. We ask that you follow-up with your pharmacy.

## 2024-02-18 NOTE — Telephone Encounter (Signed)
 FYI Only or Action Required?: Action required by provider: clinical question for provider.  Patient was last seen in primary care on 12/22/2023 by Johnny Garnette LABOR, MD.  Called Nurse Triage reporting Advice Only.   Triage Disposition: Call PCP When Office is Open  Patient/caregiver understands and will follow disposition?:     Message from Rea ORN sent at 02/18/2024  4:10 PM EST  Summary: Blood Thinner Refill   Reason for Triage: Pt is asking if there are any additional samples of ELIQUIS  5 MG TABS tablet. Due to financial she is asking if there is any way she can get another sample. If there are no samples she needs it sent to [facility] [place], [place] - [address]         Reason for Disposition  [1] Caller has NON-URGENT medicine question about med that doctor (or NP/PA) prescribed AND [2] triager unable to answer question  Answer Assessment - Initial Assessment Questions 1. DRUG NAME: What medicine do you need to have refilled?     Returned call to pt, discussed clinic would have to f/u with pt directly if there were samples in office. Questioned if pt was out of rx at this time and she stated she has 10 pills remaining. Discussed Eliquis  coupon/assistance program and provided her with website information. She voiced appreciation. Stated I would relay message regarding samples to PCP. Please advise.  Protocols used: Medication Refill and Renewal Call-A-AH

## 2024-02-21 ENCOUNTER — Other Ambulatory Visit: Payer: Self-pay | Admitting: Family Medicine

## 2024-02-21 ENCOUNTER — Telehealth: Payer: Self-pay

## 2024-02-21 MED ORDER — APIXABAN 5 MG PO TABS: 5.0000 mg | ORAL_TABLET | Freq: Two times a day (BID) | ORAL | 11 refills | Status: AC

## 2024-02-21 NOTE — Telephone Encounter (Signed)
 I sent in refills.

## 2024-02-21 NOTE — Telephone Encounter (Signed)
 Copied from CRM #[id number]. Topic: Clinical - Medication Refill >> Feb 18, 2024  4:10 PM Leiah Giannotti W wrote: Medication:  LORazepam  (ATIVAN ) 2 MG tablet    Has the patient contacted their pharmacy? No (Agent: If no, request that the patient contact the pharmacy for the refill. If patient does not wish to contact the pharmacy document the reason why and proceed with request.) (Agent: If yes, when and what did the pharmacy advise?)  This is the patient's preferred pharmacy:  [facility] [place], [place] - [address] [place] [address] Phone: [phone] Fax: [phone]  Is this the correct pharmacy for this prescription? Yes If no, delete pharmacy and type the correct one.   Has the prescription been filled recently? No  Is the patient out of the medication? No, 2 tabs left  Has the patient been seen for an appointment in the last year OR does the patient have an upcoming appointment? Yes  Can we respond through MyChart? Yes  Agent: Please be advised that Rx refills may take up to 3 business days. We ask that you follow-up with your pharmacy. >> Feb 21, 2024 10:28 AM Emylou G wrote: Adv patient of turn time - she is worried she only has a few left

## 2024-02-21 NOTE — Progress Notes (Signed)
 Done

## 2024-02-22 MED ORDER — LORAZEPAM 2 MG PO TABS: ORAL_TABLET | ORAL | 5 refills | Status: AC

## 2024-02-23 NOTE — Telephone Encounter (Signed)
 Refills have been sent.

## 2024-03-10 ENCOUNTER — Ambulatory Visit: Payer: Self-pay

## 2024-03-10 ENCOUNTER — Other Ambulatory Visit: Payer: Self-pay | Admitting: Family Medicine

## 2024-03-10 MED ORDER — NITROFURANTOIN MONOHYD MACRO 100 MG PO CAPS: 100.0000 mg | ORAL_CAPSULE | Freq: Two times a day (BID) | ORAL | 0 refills | Status: DC

## 2024-03-10 NOTE — Telephone Encounter (Signed)
 FYI Only or Action Required?: Action required by provider: Requesting antibiotics, says Dr. Johnny sends in without appt .  Patient was last seen in primary care on 12/22/2023 by Laurie Schaefer LABOR, MD.  Called Nurse Triage reporting Urinary Frequency.  Symptoms began 4 days ago.  Interventions attempted: Rest, hydration, or home remedies.  Symptoms are: gradually worsening.  Triage Disposition: See HCP Within 4 Hours (Or PCP Triage)  Patient/caregiver understands and will follow disposition?: No, wishes to speak with PCP   Copied from CRM #[id number]. Topic: Clinical - Red Word Triage >> Mar 10, 2024  8:08 AM Laurie Schaefer wrote: Red Word that prompted transfer to Nurse Triage: lower back pain, dark urine, bad headache Reason for Disposition  Side (flank) or lower back pain present  Answer Assessment - Initial Assessment Questions 1. SYMPTOM: What's the main symptom you're concerned about? (e.g., frequency, incontinence)     Frequency, back ache 2. ONSET: When did the frequency  start?     4 days ago 3. PAIN: Is there any pain? If Yes, ask: How bad is it? (Scale: 1-10; rate, severe)     Denies pain, urine very dark and odorous 4. CAUSE: What do you think is causing the symptoms?     UTI 5. OTHER SYMPTOMS: Do you have any other symptoms? (e.g., blood in urine, fever, flank pain, pain with urination)     Temp 99  Protocols used: Urinary Symptoms-A-AH

## 2024-03-10 NOTE — Progress Notes (Signed)
 Done

## 2024-03-10 NOTE — Telephone Encounter (Signed)
 Notified pt to pick up Rx from her pharmacy, voiced understanding

## 2024-03-10 NOTE — Telephone Encounter (Signed)
 I sent in 7 days of Macrobid 

## 2024-03-16 ENCOUNTER — Encounter: Payer: Self-pay | Admitting: Family Medicine

## 2024-03-16 ENCOUNTER — Ambulatory Visit (INDEPENDENT_AMBULATORY_CARE_PROVIDER_SITE_OTHER): Admitting: Family Medicine

## 2024-03-16 ENCOUNTER — Other Ambulatory Visit: Payer: Self-pay

## 2024-03-16 VITALS — BP 128/84 | HR 81 | Temp 98.6°F | Wt 208.0 lb

## 2024-03-16 DIAGNOSIS — K5903 Drug induced constipation: Secondary | ICD-10-CM

## 2024-03-16 DIAGNOSIS — G8929 Other chronic pain: Secondary | ICD-10-CM

## 2024-03-16 DIAGNOSIS — M797 Fibromyalgia: Secondary | ICD-10-CM | POA: Diagnosis not present

## 2024-03-16 DIAGNOSIS — N390 Urinary tract infection, site not specified: Secondary | ICD-10-CM | POA: Diagnosis not present

## 2024-03-16 LAB — POC URINALSYSI DIPSTICK (AUTOMATED)
Bilirubin, UA: NEGATIVE
Blood, UA: NEGATIVE
Glucose, UA: NEGATIVE
Ketones, UA: NEGATIVE
Leukocytes, UA: NEGATIVE
Nitrite, UA: NEGATIVE
Protein, UA: POSITIVE — AB
Spec Grav, UA: 1.025
Urobilinogen, UA: 0.2 U/dL
pH, UA: 5.5

## 2024-03-16 MED ORDER — QUETIAPINE FUMARATE 25 MG PO TABS: 25.0000 mg | ORAL_TABLET | Freq: Every day | ORAL | 3 refills | Status: AC

## 2024-03-16 MED ORDER — MORPHINE SULFATE 15 MG PO TABS: 15.0000 mg | ORAL_TABLET | ORAL | 0 refills | Status: DC | PRN

## 2024-03-16 NOTE — Progress Notes (Signed)
" ° °  Subjective:    Patient ID: Laurie Schaefer, female    DOB: [birth year], 69 y.o.   MRN: [id number]  HPI Here for pain management. She continues to deal with daily body pains and with severe constipation. She has tried Hydromorphone  and Hydrocodone , but these did not give her adequate pain relief. She has had reasonable pain relief with Oxycodone , but this always causes her to have severe constipation. She has tried numerous OTC stool softeners with little relief. She tried Amitiza  briefly, but she cannot tolerate this due to severe abdominal cramps. She saw the GI clinic recently and they gave her some samples to try Linzess . This actually worked well for her, but she cannot afford to pay for it at the pharmacy. Lately she has been using 3 scoops of Miralax  daily with 3 glasses of water. This gives her mixed results, but it often causes her to have uncontrollable diarrhea.    Review of Systems  Constitutional: Negative.   Gastrointestinal:  Positive for constipation.  Musculoskeletal:  Positive for myalgias.       Objective:   Physical Exam Constitutional:      Appearance: Normal appearance.  Neurological:     Mental Status: She is alert.           Assessment & Plan:  Pain management. Indication for chronic opioid: fibromyalgia  Medication and dose: Morphine  sulfate 15 mg  # pills per month: 180 Last UDS date: 08-13-23 Opioid Treatment Agreement signed (Y/N): 01-15-20 Opioid Treatment Agreement last reviewed with patient:  03-16-24 [facility] reviewed this encounter (include red flags): Yes She will try a month of Morphine  sulfate 15 mg as needed. Hopefully this will help with the pain but will cause less constipation.  Garnette Olmsted, MD   "

## 2024-03-28 ENCOUNTER — Other Ambulatory Visit: Payer: Self-pay

## 2024-03-28 ENCOUNTER — Telehealth: Payer: Self-pay | Admitting: *Deleted

## 2024-03-28 MED ORDER — ATORVASTATIN CALCIUM 10 MG PO TABS: 10.0000 mg | ORAL_TABLET | Freq: Every day | ORAL | 3 refills | Status: DC

## 2024-03-28 NOTE — Progress Notes (Signed)
" ° °  03/28/2024  Patient ID: Laurie Schaefer, female   DOB: October 19, 1955, 69 y.o.   MRN: [id number]  Pharmacy Quality Measure Review  This patient is appearing on a report for being at risk of failing the adherence measure for cholesterol (statin) medications this calendar year.   Medication: Atorvastatin  10mg  Last fill date: 10/09/23 for 90 day supply  Will collaborate with provider to facilitate refill needs.  [name], PharmD Clinical Pharmacist [phone]   "

## 2024-03-28 NOTE — Telephone Encounter (Signed)
 Copied from CRM #[id number]. Topic: Clinical - Prescription Issue >> Mar 28, 2024 11:45 AM Alfonso ORN wrote: Reason for CRM: pt said she has been having trouble sending message through [employer] and so she sent a letter in the mail 4 days ago to provider requesting medication refills for 5 rxs . She is unsure of which medications are needed but wanted to advise to look for a letter with detailed request. Please call pt back to confirm receipt of letter.

## 2024-03-30 ENCOUNTER — Other Ambulatory Visit (HOSPITAL_COMMUNITY): Payer: Self-pay

## 2024-03-30 ENCOUNTER — Telehealth: Payer: Self-pay

## 2024-03-30 NOTE — Telephone Encounter (Signed)
 Pharmacy Patient Advocate Encounter   Received notification from [facility] that prior authorization for Lorazepam  2 is required/requested.   Insurance verification completed.   The patient is insured through [employer].   Per test claim: PA required; PA submitted to above mentioned insurance via Latent Key/confirmation #/EOC [id number] Status is pending

## 2024-03-31 MED ORDER — ATORVASTATIN CALCIUM 10 MG PO TABS: 10.0000 mg | ORAL_TABLET | Freq: Every day | ORAL | 3 refills | Status: DC

## 2024-03-31 MED ORDER — DOCUSATE SODIUM 100 MG PO CAPS: 100.0000 mg | ORAL_CAPSULE | Freq: Two times a day (BID) | ORAL | 5 refills | Status: AC

## 2024-03-31 MED ORDER — ONDANSETRON HCL 8 MG PO TABS: 8.0000 mg | ORAL_TABLET | Freq: Four times a day (QID) | ORAL | 5 refills | Status: AC | PRN

## 2024-03-31 MED ORDER — TEMAZEPAM 30 MG PO CAPS: 30.0000 mg | ORAL_CAPSULE | Freq: Every evening | ORAL | 5 refills | Status: AC | PRN

## 2024-03-31 MED ORDER — POLYETHYLENE GLYCOL 3350 17 GM/SCOOP PO POWD: 17.0000 g | Freq: Two times a day (BID) | ORAL | 5 refills | Status: AC | PRN

## 2024-03-31 MED ORDER — LUBIPROSTONE 24 MCG PO CAPS: 24.0000 ug | ORAL_CAPSULE | Freq: Two times a day (BID) | ORAL | 11 refills | Status: AC

## 2024-03-31 NOTE — Telephone Encounter (Signed)
 Attempted to call pt with no success, awaiting pt mail to arrive

## 2024-03-31 NOTE — Telephone Encounter (Signed)
 Done

## 2024-03-31 NOTE — Telephone Encounter (Signed)
 Left pt detailed message for pt advised to call the office back regarding this message

## 2024-04-02 ENCOUNTER — Other Ambulatory Visit: Payer: Self-pay | Admitting: Family Medicine

## 2024-04-03 ENCOUNTER — Other Ambulatory Visit (HOSPITAL_COMMUNITY): Payer: Self-pay

## 2024-04-04 ENCOUNTER — Telehealth: Payer: Self-pay

## 2024-04-04 NOTE — Telephone Encounter (Signed)
 Gave pt a call pt is due for re-enrollment on [employer] left a HIPAA VM.

## 2024-04-05 ENCOUNTER — Telehealth: Payer: Self-pay

## 2024-04-05 NOTE — Progress Notes (Signed)
" ° °  04/05/2024  Patient ID: Laurie Schaefer, female   DOB: 05-07-1955, 69 y.o.   MRN: [id number]  Provider completed their portion of Breztri renewal application for 2026 through [employer]. Faxed into [facility] for continued processing.  [name], PharmD Clinical Pharmacist [phone]  "

## 2024-04-05 NOTE — Telephone Encounter (Signed)
 Received provider portion [employer] ([employer]),waiting on pt portion.

## 2024-04-05 NOTE — Telephone Encounter (Signed)
 Mail out pt portion PAP [employer] D[place]) faxing provider portion.

## 2024-04-06 ENCOUNTER — Ambulatory Visit: Admitting: Family Medicine

## 2024-04-06 NOTE — Telephone Encounter (Signed)
 Spoke with pt regarding this stated that she has appointment scheduled with Dr Johnny for this.

## 2024-04-07 ENCOUNTER — Other Ambulatory Visit (HOSPITAL_COMMUNITY): Payer: Self-pay

## 2024-04-10 ENCOUNTER — Ambulatory Visit: Admitting: Family Medicine

## 2024-04-11 ENCOUNTER — Other Ambulatory Visit (HOSPITAL_COMMUNITY): Payer: Self-pay

## 2024-04-11 ENCOUNTER — Ambulatory Visit: Admitting: Family Medicine

## 2024-04-11 ENCOUNTER — Telehealth: Payer: Self-pay

## 2024-04-12 ENCOUNTER — Ambulatory Visit: Payer: Self-pay

## 2024-04-12 NOTE — Telephone Encounter (Signed)
 FYI Only or Action Required?: FYI only for provider: appointment scheduled on 04/14/24 - Laurie Schaefer refused sooner appt d/t transportation issues.  Laurie Schaefer was last seen in primary care on 03/16/2024 by Laurie Schaefer LABOR, MD.  Called Nurse Triage reporting Fall and Bleeding/Bruising.  Symptoms began a week ago.  Interventions attempted: Prescription medications: Oxycodone .  Symptoms are: gradually worsening.  Triage Disposition: See Physician Within 24 Hours, See PCP When Office is Open (Within 3 Days)  Laurie Schaefer/caregiver understands and will follow disposition?: Yes, but will wait  Reason for Disposition  Taking Coumadin (warfarin) or other strong blood thinner, OR known bleeding disorder (e.g., thrombocytopenia)  (Exception: Small bruise at heparin injection site.)  [1] After 3 days AND [2] pain not improved  Answer Assessment - Initial Assessment Questions 1. MECHANISM: How did the injury happen? (e.g., twisting injury, direct blow)      *No Answer* 2. ONSET: When did the injury happen? (e.g., minutes, hours ago)      *No Answer* 3. LOCATION: Where is the injury located?      *No Answer* 4. APPEARANCE of INJURY: What does the injury look like?      *No Answer* 5. SEVERITY: Can you put weight on that leg? Can you walk?      *No Answer* 6. SIZE: For cuts, bruises, or swelling, ask: How large is it? (e.g., inches or centimeters;  entire joint)      Small dots that look like bruises up shin  7. PAIN: Is there pain? If Yes, ask: How bad is the pain?   What does it keep you from doing? (Scale 0-10; or none, mild, moderate, severe)     *No Answer* 8. TETANUS: For any breaks in the skin, ask: When was your last tetanus booster?     *No Answer* 9. OTHER SYMPTOMS: Do you have any other symptoms?  (e.g., pop when knee injured, swelling, locking, buckling)      *No Answer* 10. PREGNANCY: Is there any chance you are pregnant? When was your last menstrual period?       *No  Answer*  Answer Assessment - Initial Assessment Questions Laurie Schaefer calling to report a fall that she had a week and a half ago. Laurie Schaefer states she fell to her knees and is now noticing some small bruises that look like spots to the Left and Right shins, complaint of 8/10 pain in bilateral knees with some swelling. Laurie Schaefer taking Oxycodone  and is on Eliquis .  Per protocol, Laurie Schaefer advised to see HVP within 24 hours - Laurie Schaefer declined stating she is unable to come today or tomorrow as she does not have transportation and family is not around. Laurie Schaefer advised to go to UC or call us  regarding appt if she can find transportation. Laurie Schaefer scheduled with PCP 04/14/24.   1. APPEARANCE of BRUISE: Describe the bruise.      Spots  2. SIZE: How large is the bruise?      Small  3. NUMBER: How many bruises are there?      N/a  4. LOCATION: Where is the bruise located?      Left and right shins  5. ONSET: How long ago did the bruise occur?      Week and a half ago 6. CAUSE: What do you think caused the bruise?     Laurie Schaefer fell on knees 7. MEDICAL HISTORY: Do you have any medical problems that can cause easy bruising or bleeding? (e.g., leukemia, liver disease, recent chemotherapy)     Yes  8. MEDICINES: Do you take any medicines which thin the blood such as: aspirin, apixaban , heparin, ibuprofen  (NSAIDS), Plavix, or Coumadin?     Eliquis   9. OTHER SYMPTOMS: Do you have any other symptoms?  (e.g., weakness, dizziness, pain, fever, nosebleed, blood in urine/stool)     Pain in bilateral knees, weakness  Protocols used: Knee Injury-A-AH, Bruises-A-AH  Reason for Triage: Laurie Schaefer experienced a fall a week ago and is experiencing really bad knee pain. Laurie Schaefer some how was able to schedule an appointment for this coming Monday. She said the appointment is for pain management but scheduler documented the fall.

## 2024-04-12 NOTE — Telephone Encounter (Signed)
 Pt called back asking if appt tomorrow was still available as she does not want to wait until Friday, 04/14/24. Discussed no appts with PCP or anyone else in clinic, no appts at [facility]. Discussed I would add pt to wait list for tomorrow and if any cancellations occur, clinic will contact pt. She states she will keep her phone by her and get ready in the morning so if clinic calls she will be ready to go. Pt is using heat and taking oxycodone  which decreases pain at this time.     Reason for Triage: Patient experienced a fall a week ago and is experiencing really bad knee pain. Patient some how was able to schedule an appointment for this coming Monday. She said the appointment is for pain management but scheduler documented the fall.

## 2024-04-13 NOTE — Telephone Encounter (Signed)
 Spoke with Laurie Schaefer aware that her appointment with Dr Johnny is on 04/14/24 at 1 pm, voiced understanding

## 2024-04-14 ENCOUNTER — Encounter: Payer: Self-pay | Admitting: Family Medicine

## 2024-04-14 ENCOUNTER — Ambulatory Visit: Admitting: Family Medicine

## 2024-04-14 ENCOUNTER — Ambulatory Visit

## 2024-04-14 VITALS — BP 120/80 | HR 103 | Wt 208.0 lb

## 2024-04-14 DIAGNOSIS — S8002XA Contusion of left knee, initial encounter: Secondary | ICD-10-CM

## 2024-04-14 DIAGNOSIS — S8001XA Contusion of right knee, initial encounter: Secondary | ICD-10-CM

## 2024-04-14 MED ORDER — OXYCODONE HCL 30 MG PO TABS: 30.0000 mg | ORAL_TABLET | Freq: Four times a day (QID) | ORAL | 0 refills | Status: AC | PRN

## 2024-04-14 MED ORDER — METHYLPREDNISOLONE ACETATE 40 MG/ML IJ SUSP
40.0000 mg | Freq: Once | INTRAMUSCULAR | Status: AC
  Administered 2024-04-14: 40 mg via INTRAMUSCULAR

## 2024-04-14 MED ORDER — METHYLPREDNISOLONE ACETATE 80 MG/ML IJ SUSP
80.0000 mg | Freq: Once | INTRAMUSCULAR | Status: AC
  Administered 2024-04-14: 80 mg via INTRAMUSCULAR

## 2024-04-14 NOTE — Addendum Note (Signed)
 Addended by: LADONNA INOCENTE SAILOR on: 04/14/2024 03:33 PM   Modules accepted: Orders

## 2024-04-14 NOTE — Progress Notes (Signed)
" ° °  Subjective:    Patient ID: Laurie Schaefer, female    DOB: 1955-09-14, 69 y.o.   MRN: [id number]  HPI Here for injuries to both knees. While walking at home with socked feet on a hardwood floor. Her feet slipped and she fell forward, landing on both knees. Since then she has had swelling and pain in both knees. She can walk albeit slowly. She can drive her car. Of note she is in a pain management program with us , and she has been trying Morphine  sulfate this past month. She is not happy with this and she wishes to get back on Oxycodone  like she took before.    Review of Systems  Constitutional: Negative.   Respiratory:  Positive for shortness of breath.   Cardiovascular: Negative.   Musculoskeletal:  Positive for arthralgias.       Objective:   Physical Exam Constitutional:      Comments: Wearing [place] oxygen    Cardiovascular:     Rate and Rhythm: Normal rate and regular rhythm.     Pulses: Normal pulses.     Heart sounds: Normal heart sounds.  Pulmonary:     Effort: Pulmonary effort is normal.     Breath sounds: Normal breath sounds.  Musculoskeletal:     Comments: Both knees are swollen. No ecchymoses. She is very tender in both joint spaces of both knees and in both patellae. ROM is reduced on both sides   Neurological:     Mental Status: She is alert.           Assessment & Plan:  She has had contusions to both knees. We will get Xrays of both today. She is given a shot of DepoMedrol. For her chronic pain, we will stop the Morphine  and start back on oxycodone  30 mg every 6 hours as needed. Garnette Olmsted, MD   "

## 2024-04-17 ENCOUNTER — Ambulatory Visit: Admitting: Family Medicine
